# Patient Record
Sex: Male | Born: 1935 | Race: White | Hispanic: No | Marital: Married | Smoking: Former smoker
Health system: Southern US, Community
[De-identification: ages and names within clinical notes are randomized; demographics above are authoritative.]

## PROBLEM LIST (undated history)

## (undated) DIAGNOSIS — R32 Unspecified urinary incontinence: Secondary | ICD-10-CM

## (undated) DIAGNOSIS — Z8619 Personal history of other infectious and parasitic diseases: Secondary | ICD-10-CM

## (undated) DIAGNOSIS — E785 Hyperlipidemia, unspecified: Secondary | ICD-10-CM

## (undated) DIAGNOSIS — H409 Unspecified glaucoma: Secondary | ICD-10-CM

## (undated) DIAGNOSIS — N400 Enlarged prostate without lower urinary tract symptoms: Secondary | ICD-10-CM

## (undated) DIAGNOSIS — E669 Obesity, unspecified: Secondary | ICD-10-CM

## (undated) DIAGNOSIS — K219 Gastro-esophageal reflux disease without esophagitis: Secondary | ICD-10-CM

## (undated) DIAGNOSIS — L570 Actinic keratosis: Secondary | ICD-10-CM

## (undated) DIAGNOSIS — R7303 Prediabetes: Secondary | ICD-10-CM

## (undated) DIAGNOSIS — L719 Rosacea, unspecified: Secondary | ICD-10-CM

## (undated) DIAGNOSIS — K5909 Other constipation: Secondary | ICD-10-CM

## (undated) HISTORY — DX: Gastro-esophageal reflux disease without esophagitis: K21.9

## (undated) HISTORY — DX: Unspecified glaucoma: H40.9

## (undated) HISTORY — DX: Rosacea, unspecified: L71.9

## (undated) HISTORY — DX: Prediabetes: R73.03

## (undated) HISTORY — DX: Personal history of other infectious and parasitic diseases: Z86.19

## (undated) HISTORY — DX: Other constipation: K59.09

## (undated) HISTORY — DX: Actinic keratosis: L57.0

## (undated) HISTORY — DX: Hyperlipidemia, unspecified: E78.5

## (undated) HISTORY — DX: Benign prostatic hyperplasia without lower urinary tract symptoms: N40.0

## (undated) HISTORY — DX: Unspecified urinary incontinence: R32

## (undated) HISTORY — DX: Obesity, unspecified: E66.9

---

## 1974-06-05 HISTORY — PX: HEMORRHOID SURGERY: SHX153

## 2007-01-30 DIAGNOSIS — C4492 Squamous cell carcinoma of skin, unspecified: Secondary | ICD-10-CM

## 2007-01-30 HISTORY — DX: Squamous cell carcinoma of skin, unspecified: C44.92

## 2009-03-05 HISTORY — PX: ESOPHAGOGASTRODUODENOSCOPY: SHX1529

## 2009-03-05 HISTORY — PX: COLONOSCOPY: SHX174

## 2009-03-10 LAB — HM COLONOSCOPY

## 2011-06-13 DIAGNOSIS — R351 Nocturia: Secondary | ICD-10-CM | POA: Diagnosis not present

## 2011-06-13 DIAGNOSIS — R350 Frequency of micturition: Secondary | ICD-10-CM | POA: Diagnosis not present

## 2011-06-13 DIAGNOSIS — N401 Enlarged prostate with lower urinary tract symptoms: Secondary | ICD-10-CM | POA: Diagnosis not present

## 2011-06-22 DIAGNOSIS — L570 Actinic keratosis: Secondary | ICD-10-CM | POA: Diagnosis not present

## 2011-06-22 DIAGNOSIS — D045 Carcinoma in situ of skin of trunk: Secondary | ICD-10-CM | POA: Diagnosis not present

## 2011-08-01 DIAGNOSIS — R351 Nocturia: Secondary | ICD-10-CM | POA: Diagnosis not present

## 2011-08-08 DIAGNOSIS — E871 Hypo-osmolality and hyponatremia: Secondary | ICD-10-CM | POA: Diagnosis not present

## 2011-08-23 DIAGNOSIS — L719 Rosacea, unspecified: Secondary | ICD-10-CM | POA: Diagnosis not present

## 2011-08-23 DIAGNOSIS — Z85828 Personal history of other malignant neoplasm of skin: Secondary | ICD-10-CM | POA: Diagnosis not present

## 2011-08-31 DIAGNOSIS — E871 Hypo-osmolality and hyponatremia: Secondary | ICD-10-CM | POA: Diagnosis not present

## 2011-10-10 DIAGNOSIS — H409 Unspecified glaucoma: Secondary | ICD-10-CM | POA: Diagnosis not present

## 2011-10-10 DIAGNOSIS — H4010X0 Unspecified open-angle glaucoma, stage unspecified: Secondary | ICD-10-CM | POA: Diagnosis not present

## 2011-11-27 DIAGNOSIS — R351 Nocturia: Secondary | ICD-10-CM | POA: Diagnosis not present

## 2011-11-27 DIAGNOSIS — E871 Hypo-osmolality and hyponatremia: Secondary | ICD-10-CM | POA: Diagnosis not present

## 2011-11-28 DIAGNOSIS — R030 Elevated blood-pressure reading, without diagnosis of hypertension: Secondary | ICD-10-CM | POA: Diagnosis not present

## 2011-11-28 DIAGNOSIS — K219 Gastro-esophageal reflux disease without esophagitis: Secondary | ICD-10-CM | POA: Diagnosis not present

## 2011-11-28 DIAGNOSIS — Z125 Encounter for screening for malignant neoplasm of prostate: Secondary | ICD-10-CM | POA: Diagnosis not present

## 2011-11-28 DIAGNOSIS — E782 Mixed hyperlipidemia: Secondary | ICD-10-CM | POA: Diagnosis not present

## 2011-11-28 DIAGNOSIS — R350 Frequency of micturition: Secondary | ICD-10-CM | POA: Diagnosis not present

## 2011-12-28 DIAGNOSIS — I10 Essential (primary) hypertension: Secondary | ICD-10-CM | POA: Diagnosis not present

## 2012-04-16 DIAGNOSIS — H2510 Age-related nuclear cataract, unspecified eye: Secondary | ICD-10-CM | POA: Diagnosis not present

## 2012-04-16 DIAGNOSIS — H4010X0 Unspecified open-angle glaucoma, stage unspecified: Secondary | ICD-10-CM | POA: Diagnosis not present

## 2012-05-23 DIAGNOSIS — L719 Rosacea, unspecified: Secondary | ICD-10-CM | POA: Diagnosis not present

## 2012-05-23 DIAGNOSIS — Z85828 Personal history of other malignant neoplasm of skin: Secondary | ICD-10-CM | POA: Diagnosis not present

## 2012-05-23 DIAGNOSIS — D1800 Hemangioma unspecified site: Secondary | ICD-10-CM | POA: Diagnosis not present

## 2012-05-23 DIAGNOSIS — L570 Actinic keratosis: Secondary | ICD-10-CM | POA: Diagnosis not present

## 2012-09-24 DIAGNOSIS — K219 Gastro-esophageal reflux disease without esophagitis: Secondary | ICD-10-CM | POA: Diagnosis not present

## 2012-09-24 DIAGNOSIS — E782 Mixed hyperlipidemia: Secondary | ICD-10-CM | POA: Diagnosis not present

## 2012-09-24 DIAGNOSIS — N400 Enlarged prostate without lower urinary tract symptoms: Secondary | ICD-10-CM | POA: Diagnosis not present

## 2012-09-24 LAB — PSA: PSA: 0.4

## 2012-11-05 DIAGNOSIS — H4010X0 Unspecified open-angle glaucoma, stage unspecified: Secondary | ICD-10-CM | POA: Diagnosis not present

## 2012-11-05 DIAGNOSIS — H409 Unspecified glaucoma: Secondary | ICD-10-CM | POA: Diagnosis not present

## 2012-11-13 DIAGNOSIS — R351 Nocturia: Secondary | ICD-10-CM | POA: Diagnosis not present

## 2012-11-13 DIAGNOSIS — N401 Enlarged prostate with lower urinary tract symptoms: Secondary | ICD-10-CM | POA: Diagnosis not present

## 2012-12-17 DIAGNOSIS — H4011X0 Primary open-angle glaucoma, stage unspecified: Secondary | ICD-10-CM | POA: Diagnosis not present

## 2013-01-02 DIAGNOSIS — Z85828 Personal history of other malignant neoplasm of skin: Secondary | ICD-10-CM | POA: Diagnosis not present

## 2013-01-02 DIAGNOSIS — L819 Disorder of pigmentation, unspecified: Secondary | ICD-10-CM | POA: Diagnosis not present

## 2013-01-02 DIAGNOSIS — L570 Actinic keratosis: Secondary | ICD-10-CM | POA: Diagnosis not present

## 2013-04-09 DIAGNOSIS — E782 Mixed hyperlipidemia: Secondary | ICD-10-CM | POA: Diagnosis not present

## 2013-04-09 DIAGNOSIS — N400 Enlarged prostate without lower urinary tract symptoms: Secondary | ICD-10-CM | POA: Diagnosis not present

## 2013-04-09 DIAGNOSIS — K219 Gastro-esophageal reflux disease without esophagitis: Secondary | ICD-10-CM | POA: Diagnosis not present

## 2013-04-16 DIAGNOSIS — L719 Rosacea, unspecified: Secondary | ICD-10-CM | POA: Diagnosis not present

## 2013-04-16 DIAGNOSIS — D485 Neoplasm of uncertain behavior of skin: Secondary | ICD-10-CM | POA: Diagnosis not present

## 2013-04-16 DIAGNOSIS — L820 Inflamed seborrheic keratosis: Secondary | ICD-10-CM | POA: Diagnosis not present

## 2013-04-16 DIAGNOSIS — L570 Actinic keratosis: Secondary | ICD-10-CM | POA: Diagnosis not present

## 2013-05-19 DIAGNOSIS — K5900 Constipation, unspecified: Secondary | ICD-10-CM | POA: Diagnosis not present

## 2013-05-19 DIAGNOSIS — K439 Ventral hernia without obstruction or gangrene: Secondary | ICD-10-CM | POA: Diagnosis not present

## 2013-05-20 DIAGNOSIS — H4010X0 Unspecified open-angle glaucoma, stage unspecified: Secondary | ICD-10-CM | POA: Diagnosis not present

## 2013-05-20 DIAGNOSIS — H2510 Age-related nuclear cataract, unspecified eye: Secondary | ICD-10-CM | POA: Diagnosis not present

## 2013-05-20 DIAGNOSIS — H409 Unspecified glaucoma: Secondary | ICD-10-CM | POA: Diagnosis not present

## 2013-05-20 DIAGNOSIS — H18419 Arcus senilis, unspecified eye: Secondary | ICD-10-CM | POA: Diagnosis not present

## 2013-06-25 DIAGNOSIS — M6200 Separation of muscle (nontraumatic), unspecified site: Secondary | ICD-10-CM | POA: Diagnosis not present

## 2013-06-25 DIAGNOSIS — R198 Other specified symptoms and signs involving the digestive system and abdomen: Secondary | ICD-10-CM | POA: Diagnosis not present

## 2013-06-25 DIAGNOSIS — K5900 Constipation, unspecified: Secondary | ICD-10-CM | POA: Diagnosis not present

## 2013-07-21 ENCOUNTER — Emergency Department: Payer: Self-pay | Admitting: Emergency Medicine

## 2013-07-21 DIAGNOSIS — E785 Hyperlipidemia, unspecified: Secondary | ICD-10-CM | POA: Diagnosis not present

## 2013-07-21 DIAGNOSIS — S0180XA Unspecified open wound of other part of head, initial encounter: Secondary | ICD-10-CM | POA: Diagnosis not present

## 2013-07-26 ENCOUNTER — Emergency Department: Payer: Self-pay | Admitting: Emergency Medicine

## 2013-10-30 DIAGNOSIS — L719 Rosacea, unspecified: Secondary | ICD-10-CM | POA: Diagnosis not present

## 2013-10-30 DIAGNOSIS — B079 Viral wart, unspecified: Secondary | ICD-10-CM | POA: Diagnosis not present

## 2013-10-30 DIAGNOSIS — N401 Enlarged prostate with lower urinary tract symptoms: Secondary | ICD-10-CM | POA: Diagnosis not present

## 2013-10-30 DIAGNOSIS — N138 Other obstructive and reflux uropathy: Secondary | ICD-10-CM | POA: Diagnosis not present

## 2013-10-30 DIAGNOSIS — L570 Actinic keratosis: Secondary | ICD-10-CM | POA: Diagnosis not present

## 2013-11-04 DIAGNOSIS — N138 Other obstructive and reflux uropathy: Secondary | ICD-10-CM | POA: Diagnosis not present

## 2013-11-04 DIAGNOSIS — N401 Enlarged prostate with lower urinary tract symptoms: Secondary | ICD-10-CM | POA: Diagnosis not present

## 2013-11-04 DIAGNOSIS — E782 Mixed hyperlipidemia: Secondary | ICD-10-CM | POA: Diagnosis not present

## 2013-11-04 DIAGNOSIS — R39198 Other difficulties with micturition: Secondary | ICD-10-CM | POA: Diagnosis not present

## 2013-11-04 DIAGNOSIS — R7309 Other abnormal glucose: Secondary | ICD-10-CM | POA: Diagnosis not present

## 2013-11-25 DIAGNOSIS — H4010X0 Unspecified open-angle glaucoma, stage unspecified: Secondary | ICD-10-CM | POA: Diagnosis not present

## 2013-11-25 DIAGNOSIS — H409 Unspecified glaucoma: Secondary | ICD-10-CM | POA: Diagnosis not present

## 2013-12-31 DIAGNOSIS — B079 Viral wart, unspecified: Secondary | ICD-10-CM | POA: Diagnosis not present

## 2013-12-31 DIAGNOSIS — L570 Actinic keratosis: Secondary | ICD-10-CM | POA: Diagnosis not present

## 2013-12-31 DIAGNOSIS — L820 Inflamed seborrheic keratosis: Secondary | ICD-10-CM | POA: Diagnosis not present

## 2014-03-23 DIAGNOSIS — Z1389 Encounter for screening for other disorder: Secondary | ICD-10-CM | POA: Diagnosis not present

## 2014-03-23 DIAGNOSIS — E669 Obesity, unspecified: Secondary | ICD-10-CM | POA: Diagnosis not present

## 2014-03-23 DIAGNOSIS — Z23 Encounter for immunization: Secondary | ICD-10-CM | POA: Diagnosis not present

## 2014-03-23 DIAGNOSIS — R296 Repeated falls: Secondary | ICD-10-CM | POA: Diagnosis not present

## 2014-03-23 DIAGNOSIS — Z833 Family history of diabetes mellitus: Secondary | ICD-10-CM | POA: Diagnosis not present

## 2014-03-23 DIAGNOSIS — Z0000 Encounter for general adult medical examination without abnormal findings: Secondary | ICD-10-CM | POA: Diagnosis not present

## 2014-03-25 DIAGNOSIS — R351 Nocturia: Secondary | ICD-10-CM | POA: Diagnosis not present

## 2014-03-25 DIAGNOSIS — N401 Enlarged prostate with lower urinary tract symptoms: Secondary | ICD-10-CM | POA: Diagnosis not present

## 2014-03-25 DIAGNOSIS — N500 Atrophy of testis: Secondary | ICD-10-CM | POA: Diagnosis not present

## 2014-04-02 DIAGNOSIS — R12 Heartburn: Secondary | ICD-10-CM | POA: Diagnosis not present

## 2014-05-06 DIAGNOSIS — E781 Pure hyperglyceridemia: Secondary | ICD-10-CM | POA: Diagnosis not present

## 2014-05-06 DIAGNOSIS — E784 Other hyperlipidemia: Secondary | ICD-10-CM | POA: Diagnosis not present

## 2014-06-19 DIAGNOSIS — H4011X0 Primary open-angle glaucoma, stage unspecified: Secondary | ICD-10-CM | POA: Diagnosis not present

## 2014-06-19 DIAGNOSIS — N500 Atrophy of testis: Secondary | ICD-10-CM | POA: Diagnosis not present

## 2014-06-19 DIAGNOSIS — N401 Enlarged prostate with lower urinary tract symptoms: Secondary | ICD-10-CM | POA: Diagnosis not present

## 2014-06-19 DIAGNOSIS — H01009 Unspecified blepharitis unspecified eye, unspecified eyelid: Secondary | ICD-10-CM | POA: Diagnosis not present

## 2014-06-19 DIAGNOSIS — H269 Unspecified cataract: Secondary | ICD-10-CM | POA: Diagnosis not present

## 2014-07-01 DIAGNOSIS — N500 Atrophy of testis: Secondary | ICD-10-CM | POA: Diagnosis not present

## 2014-07-01 DIAGNOSIS — R351 Nocturia: Secondary | ICD-10-CM | POA: Diagnosis not present

## 2014-07-01 DIAGNOSIS — N401 Enlarged prostate with lower urinary tract symptoms: Secondary | ICD-10-CM | POA: Diagnosis not present

## 2014-09-03 DIAGNOSIS — D1800 Hemangioma unspecified site: Secondary | ICD-10-CM | POA: Diagnosis not present

## 2014-09-03 DIAGNOSIS — D229 Melanocytic nevi, unspecified: Secondary | ICD-10-CM | POA: Diagnosis not present

## 2014-09-03 DIAGNOSIS — L821 Other seborrheic keratosis: Secondary | ICD-10-CM | POA: Diagnosis not present

## 2014-09-03 DIAGNOSIS — D485 Neoplasm of uncertain behavior of skin: Secondary | ICD-10-CM | POA: Diagnosis not present

## 2014-09-03 DIAGNOSIS — Z85828 Personal history of other malignant neoplasm of skin: Secondary | ICD-10-CM | POA: Diagnosis not present

## 2014-09-03 DIAGNOSIS — Z1283 Encounter for screening for malignant neoplasm of skin: Secondary | ICD-10-CM | POA: Diagnosis not present

## 2014-09-03 DIAGNOSIS — C4491 Basal cell carcinoma of skin, unspecified: Secondary | ICD-10-CM

## 2014-09-03 DIAGNOSIS — L578 Other skin changes due to chronic exposure to nonionizing radiation: Secondary | ICD-10-CM | POA: Diagnosis not present

## 2014-09-03 DIAGNOSIS — L570 Actinic keratosis: Secondary | ICD-10-CM | POA: Diagnosis not present

## 2014-09-03 DIAGNOSIS — C44319 Basal cell carcinoma of skin of other parts of face: Secondary | ICD-10-CM | POA: Diagnosis not present

## 2014-09-03 HISTORY — DX: Basal cell carcinoma of skin, unspecified: C44.91

## 2014-09-08 DIAGNOSIS — K219 Gastro-esophageal reflux disease without esophagitis: Secondary | ICD-10-CM | POA: Insufficient documentation

## 2014-09-08 DIAGNOSIS — I10 Essential (primary) hypertension: Secondary | ICD-10-CM | POA: Insufficient documentation

## 2014-09-08 DIAGNOSIS — E66811 Obesity, class 1: Secondary | ICD-10-CM

## 2014-09-08 DIAGNOSIS — Z0000 Encounter for general adult medical examination without abnormal findings: Secondary | ICD-10-CM | POA: Insufficient documentation

## 2014-09-08 DIAGNOSIS — Z1331 Encounter for screening for depression: Secondary | ICD-10-CM | POA: Insufficient documentation

## 2014-09-08 DIAGNOSIS — H409 Unspecified glaucoma: Secondary | ICD-10-CM | POA: Insufficient documentation

## 2014-09-08 DIAGNOSIS — N400 Enlarged prostate without lower urinary tract symptoms: Secondary | ICD-10-CM | POA: Insufficient documentation

## 2014-09-08 DIAGNOSIS — K439 Ventral hernia without obstruction or gangrene: Secondary | ICD-10-CM | POA: Insufficient documentation

## 2014-09-08 DIAGNOSIS — Z23 Encounter for immunization: Secondary | ICD-10-CM | POA: Insufficient documentation

## 2014-09-08 DIAGNOSIS — E785 Hyperlipidemia, unspecified: Secondary | ICD-10-CM | POA: Insufficient documentation

## 2014-09-08 DIAGNOSIS — E663 Overweight: Secondary | ICD-10-CM | POA: Insufficient documentation

## 2014-09-08 DIAGNOSIS — E669 Obesity, unspecified: Secondary | ICD-10-CM

## 2014-09-08 DIAGNOSIS — K5909 Other constipation: Secondary | ICD-10-CM | POA: Insufficient documentation

## 2014-09-08 DIAGNOSIS — Z125 Encounter for screening for malignant neoplasm of prostate: Secondary | ICD-10-CM | POA: Insufficient documentation

## 2014-09-08 HISTORY — DX: Benign prostatic hyperplasia without lower urinary tract symptoms: N40.0

## 2014-09-08 HISTORY — DX: Obesity, class 1: E66.811

## 2014-09-08 HISTORY — DX: Obesity, unspecified: E66.9

## 2014-09-08 HISTORY — DX: Other constipation: K59.09

## 2014-11-06 ENCOUNTER — Encounter: Payer: Self-pay | Admitting: Family Medicine

## 2014-11-06 ENCOUNTER — Ambulatory Visit (INDEPENDENT_AMBULATORY_CARE_PROVIDER_SITE_OTHER): Payer: Medicare Other | Admitting: Family Medicine

## 2014-11-06 VITALS — BP 140/70 | HR 63 | Resp 15 | Ht 70.5 in | Wt 238.3 lb

## 2014-11-06 DIAGNOSIS — E782 Mixed hyperlipidemia: Secondary | ICD-10-CM | POA: Diagnosis not present

## 2014-11-06 DIAGNOSIS — K219 Gastro-esophageal reflux disease without esophagitis: Secondary | ICD-10-CM | POA: Diagnosis not present

## 2014-11-06 MED ORDER — SIMVASTATIN 40 MG PO TABS: 40.0000 mg | ORAL_TABLET | Freq: Every day | ORAL | Status: DC

## 2014-11-06 MED ORDER — FENOFIBRATE 160 MG PO TABS: 160.0000 mg | ORAL_TABLET | Freq: Every day | ORAL | Status: DC

## 2014-11-06 NOTE — Progress Notes (Signed)
   Subjective:    Patient ID: Joshua Everett, male    DOB: 08-29-1934, 80 y.o.   MRN: [id number]  Hyperlipidemia This is a chronic problem. The problem is controlled. Recent lipid tests were reviewed and are normal. Exacerbating diseases include obesity. He has no history of diabetes, hypothyroidism or liver disease. Pertinent negatives include no chest pain, leg pain, myalgias or shortness of breath. Current antihyperlipidemic treatment includes statins and fibric acid derivatives. The current treatment provides moderate improvement of lipids. There are no compliance problems.  Risk factors for coronary artery disease include dyslipidemia, male sex and obesity.   Past Medical History  Diagnosis Date  . Hyperlipidemia   . GERD (gastroesophageal reflux disease)   . Glaucoma     Past Surgical History  Procedure Laterality Date  . Hemorrhoid surgery     History   Social History  . Marital Status: Married    Spouse Name: N/A  . Number of Children: N/A  . Years of Education: N/A   Occupational History  . Not on file.   Social History Main Topics  . Smoking status: Former Research scientist (life sciences)  . Smokeless tobacco: Never Used  . Alcohol Use: No  . Drug Use: No  . Sexual Activity: Not on file   Other Topics Concern  . Not on file   Social History Narrative   Family History  Problem Relation Age of Onset  . Diabetes Mother   . Kidney disease Father         Review of Systems  Respiratory: Negative for shortness of breath.   Cardiovascular: Negative for chest pain.  Musculoskeletal: Negative for myalgias and arthralgias.   Filed Vitals:   11/06/14 0906  BP: 140/70  Pulse: 63  Resp: 15       Objective:   Physical Exam  Constitutional: He appears well-developed and well-nourished.  Cardiovascular: Normal rate.  Exam reveals no gallop and no friction rub.   No murmur heard. Carotid artery auscultation reveals no bruits bilaterally.  Pulmonary/Chest: Effort normal. He has no  wheezes.  Abdominal: Soft. Bowel sounds are normal.          Assessment & Plan:  1. Hypercholesterolemia with hypertriglyceridemia  - fenofibrate 160 MG tablet; Take 1 tablet (160 mg total) by mouth daily. Take 160 mg by mouth daily.  Dispense: 90 tablet; Refill: 1 - simvastatin (ZOCOR) 40 MG tablet; Take 1 tablet (40 mg total) by mouth daily at 6 PM.  Dispense: 90 tablet; Refill: 1 - Lipid Profile - Comp Met (CMET)

## 2014-11-07 LAB — COMPREHENSIVE METABOLIC PANEL
ALT: 14 IU/L (ref 0–44)
AST: 18 IU/L (ref 0–40)
Albumin/Globulin Ratio: 1.9 (ref 1.1–2.5)
Albumin: 4.1 g/dL (ref 3.5–4.8)
Alkaline Phosphatase: 42 IU/L (ref 39–117)
BUN / CREAT RATIO: 14 (ref 10–22)
BUN: 13 mg/dL (ref 8–27)
Bilirubin Total: 0.6 mg/dL (ref 0.0–1.2)
CHLORIDE: 100 mmol/L (ref 97–108)
CO2: 26 mmol/L (ref 18–29)
Calcium: 9.6 mg/dL (ref 8.6–10.2)
Creatinine, Ser: 0.96 mg/dL (ref 0.76–1.27)
GFR calc non Af Amer: 75 mL/min/{1.73_m2} (ref >59)
GFR, EST AFRICAN AMERICAN: 87 mL/min/{1.73_m2} (ref >59)
GLOBULIN, TOTAL: 2.2 g/dL (ref 1.5–4.5)
GLUCOSE: 98 mg/dL (ref 65–99)
POTASSIUM: 4.8 mmol/L (ref 3.5–5.2)
Sodium: 138 mmol/L (ref 134–144)
Total Protein: 6.3 g/dL (ref 6.0–8.5)

## 2014-11-07 LAB — LIPID PANEL
Chol/HDL Ratio: 3.8 ratio units (ref 0.0–5.0)
Cholesterol, Total: 147 mg/dL (ref 100–199)
HDL: 39 mg/dL — ABNORMAL LOW (ref >39)
LDL CALC: 83 mg/dL (ref 0–99)
Triglycerides: 127 mg/dL (ref 0–149)
VLDL Cholesterol Cal: 25 mg/dL (ref 5–40)

## 2014-11-19 DIAGNOSIS — C44319 Basal cell carcinoma of skin of other parts of face: Secondary | ICD-10-CM | POA: Diagnosis not present

## 2014-11-19 DIAGNOSIS — Z85828 Personal history of other malignant neoplasm of skin: Secondary | ICD-10-CM | POA: Insufficient documentation

## 2014-12-21 DIAGNOSIS — H18411 Arcus senilis, right eye: Secondary | ICD-10-CM | POA: Diagnosis not present

## 2014-12-21 DIAGNOSIS — H269 Unspecified cataract: Secondary | ICD-10-CM | POA: Diagnosis not present

## 2014-12-21 DIAGNOSIS — H01009 Unspecified blepharitis unspecified eye, unspecified eyelid: Secondary | ICD-10-CM | POA: Diagnosis not present

## 2014-12-21 DIAGNOSIS — H409 Unspecified glaucoma: Secondary | ICD-10-CM | POA: Diagnosis not present

## 2015-01-28 DIAGNOSIS — D485 Neoplasm of uncertain behavior of skin: Secondary | ICD-10-CM | POA: Diagnosis not present

## 2015-01-28 DIAGNOSIS — L739 Follicular disorder, unspecified: Secondary | ICD-10-CM | POA: Diagnosis not present

## 2015-01-28 DIAGNOSIS — D229 Melanocytic nevi, unspecified: Secondary | ICD-10-CM | POA: Diagnosis not present

## 2015-01-28 DIAGNOSIS — C4491 Basal cell carcinoma of skin, unspecified: Secondary | ICD-10-CM

## 2015-01-28 DIAGNOSIS — C44311 Basal cell carcinoma of skin of nose: Secondary | ICD-10-CM | POA: Diagnosis not present

## 2015-01-28 DIAGNOSIS — I788 Other diseases of capillaries: Secondary | ICD-10-CM | POA: Diagnosis not present

## 2015-01-28 DIAGNOSIS — D1800 Hemangioma unspecified site: Secondary | ICD-10-CM | POA: Diagnosis not present

## 2015-01-28 DIAGNOSIS — L570 Actinic keratosis: Secondary | ICD-10-CM | POA: Diagnosis not present

## 2015-01-28 DIAGNOSIS — L578 Other skin changes due to chronic exposure to nonionizing radiation: Secondary | ICD-10-CM | POA: Diagnosis not present

## 2015-01-28 HISTORY — DX: Basal cell carcinoma of skin, unspecified: C44.91

## 2015-02-16 DIAGNOSIS — H25813 Combined forms of age-related cataract, bilateral: Secondary | ICD-10-CM | POA: Diagnosis not present

## 2015-02-16 DIAGNOSIS — H5213 Myopia, bilateral: Secondary | ICD-10-CM | POA: Diagnosis not present

## 2015-02-16 DIAGNOSIS — H4010X1 Unspecified open-angle glaucoma, mild stage: Secondary | ICD-10-CM | POA: Diagnosis not present

## 2015-02-16 DIAGNOSIS — H524 Presbyopia: Secondary | ICD-10-CM | POA: Diagnosis not present

## 2015-02-16 DIAGNOSIS — H04123 Dry eye syndrome of bilateral lacrimal glands: Secondary | ICD-10-CM | POA: Diagnosis not present

## 2015-02-16 DIAGNOSIS — H52223 Regular astigmatism, bilateral: Secondary | ICD-10-CM | POA: Diagnosis not present

## 2015-04-01 DIAGNOSIS — L718 Other rosacea: Secondary | ICD-10-CM | POA: Diagnosis not present

## 2015-04-01 DIAGNOSIS — Z85828 Personal history of other malignant neoplasm of skin: Secondary | ICD-10-CM | POA: Diagnosis not present

## 2015-04-14 DIAGNOSIS — C44319 Basal cell carcinoma of skin of other parts of face: Secondary | ICD-10-CM | POA: Diagnosis not present

## 2015-05-04 ENCOUNTER — Other Ambulatory Visit: Payer: Self-pay | Admitting: Family Medicine

## 2015-05-10 ENCOUNTER — Ambulatory Visit (INDEPENDENT_AMBULATORY_CARE_PROVIDER_SITE_OTHER): Payer: Medicare Other | Admitting: Family Medicine

## 2015-05-10 ENCOUNTER — Encounter: Payer: Self-pay | Admitting: Family Medicine

## 2015-05-10 VITALS — BP 138/68 | HR 59 | Temp 98.1°F | Resp 16 | Ht 71.0 in | Wt 238.5 lb

## 2015-05-10 DIAGNOSIS — E782 Mixed hyperlipidemia: Secondary | ICD-10-CM

## 2015-05-10 NOTE — Progress Notes (Signed)
Name: Joshua Everett   MRN: [id number]    DOB: 09/03/1934   Date:05/10/2015       Progress Note  Subjective  Chief Complaint  Chief Complaint  Patient presents with  . Medication Refill    6 month follow-up  . Hyperlipidemia  . Gastroesophageal Reflux  . Constipation    Hyperlipidemia This is a chronic problem. The problem is controlled. Recent lipid tests were reviewed and are normal. Pertinent negatives include no chest pain, leg pain, myalgias or shortness of breath. Current antihyperlipidemic treatment includes statins and fibric acid derivatives. The current treatment provides significant improvement of lipids. There are no compliance problems.     Past Medical History  Diagnosis Date  . Hyperlipidemia   . GERD (gastroesophageal reflux disease)   . Glaucoma     Past Surgical History  Procedure Laterality Date  . Hemorrhoid surgery      Family History  Problem Relation Age of Onset  . Diabetes Mother   . Kidney disease Father     Social History   Social History  . Marital Status: Married    Spouse Name: N/A  . Number of Children: N/A  . Years of Education: N/A   Occupational History  . Not on file.   Social History Main Topics  . Smoking status: Former Research scientist (life sciences)  . Smokeless tobacco: Never Used  . Alcohol Use: No  . Drug Use: No  . Sexual Activity: Not on file   Other Topics Concern  . Not on file   Social History Narrative     Current outpatient prescriptions:  .  augmented betamethasone dipropionate (DIPROLENE-AF) 0.05 % cream, as needed., Disp: , Rfl:  .  B COMPLEX-BIOTIN-FA PO, Take by mouth daily., Disp: , Rfl:  .  bimatoprost (LUMIGAN) 0.01 % SOLN, Apply to eye., Disp: , Rfl:  .  desmopressin (DDAVP) 0.1 MG tablet, Take 0.1 mg by mouth daily. Urology, Disp: , Rfl:  .  fenofibrate 160 MG tablet, Take 1 tablet (160 mg total) by mouth daily. Take 160 mg by mouth daily., Disp: 90 tablet, Rfl: 1 .  Linaclotide (LINZESS) 290 MCG CAPS capsule, Take 290  mcg by mouth daily., Disp: , Rfl:  .  MULTIPLE VITAMIN PO, Take by mouth daily., Disp: , Rfl:  .  OMEGA-3 FATTY ACIDS PO, Take 1,000 mcg by mouth daily., Disp: , Rfl:  .  pantoprazole (PROTONIX) 40 MG tablet, Take 40 mg by mouth daily., Disp: , Rfl:  .  simvastatin (ZOCOR) 40 MG tablet, TAKE 1 TABLET DAILY AT 6 P.M., Disp: 90 tablet, Rfl: 0 .  tamsulosin (FLOMAX) 0.4 MG CAPS capsule, Take 0.4 mg by mouth daily., Disp: , Rfl:   No Known Allergies   Review of Systems  Constitutional: Negative for fever and chills.  Respiratory: Negative for shortness of breath.   Cardiovascular: Negative for chest pain and palpitations.  Musculoskeletal: Negative for myalgias.   Objective  Filed Vitals:   05/10/15 1041  BP: 138/68  Pulse: 59  Temp: 98.1 F (36.7 C)  TempSrc: Oral  Resp: 16  Height: 5\' 11"  (1.803 m)  Weight: 238 lb 8.3 oz (108.192 kg)  SpO2: 97%    Physical Exam  Constitutional: He is oriented to person, place, and time and well-developed, well-nourished, and in no distress.  HENT:  Head: Normocephalic and atraumatic.  Cardiovascular: Normal rate, regular rhythm and normal heart sounds.   No murmur heard. Pulmonary/Chest: Effort normal and breath sounds normal. He has no wheezes.  Abdominal:  Soft. Bowel sounds are normal. There is no tenderness.  Neurological: He is alert and oriented to person, place, and time.  Nursing note and vitals reviewed.   Assessment & Plan  1. Hypercholesterolemia with hypertriglyceridemia Stable recheck FLP and liver enzymes today. - Lipid Profile - Comprehensive Metabolic Panel (CMET)   Esther Bradstreet Asad A. [facility] 05/10/2015 10:59 AM

## 2015-05-11 ENCOUNTER — Ambulatory Visit: Payer: Medicare Other | Admitting: Family Medicine

## 2015-05-11 LAB — COMPREHENSIVE METABOLIC PANEL
A/G RATIO: 1.6 (ref 1.1–2.5)
ALT: 15 IU/L (ref 0–44)
AST: 20 IU/L (ref 0–40)
Albumin: 4 g/dL (ref 3.5–4.8)
Alkaline Phosphatase: 42 IU/L (ref 39–117)
BUN/Creatinine Ratio: 15 (ref 10–22)
BUN: 16 mg/dL (ref 8–27)
Bilirubin Total: 0.5 mg/dL (ref 0.0–1.2)
CALCIUM: 9.9 mg/dL (ref 8.6–10.2)
CO2: 28 mmol/L (ref 18–29)
Chloride: 102 mmol/L (ref 97–106)
Creatinine, Ser: 1.07 mg/dL (ref 0.76–1.27)
GFR calc Af Amer: 76 mL/min/{1.73_m2} (ref >59)
GFR, EST NON AFRICAN AMERICAN: 66 mL/min/{1.73_m2} (ref >59)
GLUCOSE: 101 mg/dL — AB (ref 65–99)
Globulin, Total: 2.5 g/dL (ref 1.5–4.5)
Potassium: 5 mmol/L (ref 3.5–5.2)
Sodium: 141 mmol/L (ref 136–144)
Total Protein: 6.5 g/dL (ref 6.0–8.5)

## 2015-05-11 LAB — LIPID PANEL
CHOL/HDL RATIO: 3.4 ratio (ref 0.0–5.0)
Cholesterol, Total: 131 mg/dL (ref 100–199)
HDL: 39 mg/dL — ABNORMAL LOW (ref >39)
LDL CALC: 68 mg/dL (ref 0–99)
Triglycerides: 118 mg/dL (ref 0–149)
VLDL Cholesterol Cal: 24 mg/dL (ref 5–40)

## 2015-06-24 ENCOUNTER — Other Ambulatory Visit: Payer: Self-pay | Admitting: Family Medicine

## 2015-06-28 ENCOUNTER — Telehealth: Payer: Self-pay | Admitting: Gastroenterology

## 2015-06-28 NOTE — Telephone Encounter (Signed)
Spoke with pt regarding his constipation. Pt stated he finally had a bowel movement today. He doesn't have any abdominal pain, nausea or vomiting. Advised him to restart his Linzess 290mg  and add miralax daily. If no improvement with these two medications then he can add milk of magnesia. Told pt he needs to drinks lots of fluid. Pt will try this for several days and call back if no improvement.

## 2015-06-28 NOTE — Telephone Encounter (Signed)
Patient called and said he has not been able to have a BM since Monday, per patient swollen stomach. He normally takes lizness but he did stop it when he realized he couldnt go to the bathroom. Records in old system, Looks like patient hasn't been seen since 03/2014

## 2015-06-30 DIAGNOSIS — R351 Nocturia: Secondary | ICD-10-CM | POA: Diagnosis not present

## 2015-06-30 DIAGNOSIS — N500 Atrophy of testis: Secondary | ICD-10-CM | POA: Diagnosis not present

## 2015-06-30 DIAGNOSIS — N401 Enlarged prostate with lower urinary tract symptoms: Secondary | ICD-10-CM | POA: Diagnosis not present

## 2015-07-06 ENCOUNTER — Ambulatory Visit (INDEPENDENT_AMBULATORY_CARE_PROVIDER_SITE_OTHER): Payer: Medicare Other | Admitting: Family Medicine

## 2015-07-06 ENCOUNTER — Encounter: Payer: Self-pay | Admitting: Family Medicine

## 2015-07-06 VITALS — BP 136/74 | HR 68 | Temp 97.9°F | Ht 70.5 in | Wt 236.8 lb

## 2015-07-06 DIAGNOSIS — N400 Enlarged prostate without lower urinary tract symptoms: Secondary | ICD-10-CM | POA: Diagnosis not present

## 2015-07-06 DIAGNOSIS — L719 Rosacea, unspecified: Secondary | ICD-10-CM

## 2015-07-06 DIAGNOSIS — K219 Gastro-esophageal reflux disease without esophagitis: Secondary | ICD-10-CM

## 2015-07-06 DIAGNOSIS — H409 Unspecified glaucoma: Secondary | ICD-10-CM

## 2015-07-06 DIAGNOSIS — E669 Obesity, unspecified: Secondary | ICD-10-CM

## 2015-07-06 DIAGNOSIS — E785 Hyperlipidemia, unspecified: Secondary | ICD-10-CM

## 2015-07-06 DIAGNOSIS — K5900 Constipation, unspecified: Secondary | ICD-10-CM

## 2015-07-06 DIAGNOSIS — K5909 Other constipation: Secondary | ICD-10-CM

## 2015-07-06 HISTORY — DX: Rosacea, unspecified: L71.9

## 2015-07-06 NOTE — Assessment & Plan Note (Signed)
Body mass index is 33.48 kg/(m^2).

## 2015-07-06 NOTE — Patient Instructions (Addendum)
Good to meet you today.  Sign release for records from Dr Acquanetta Sit [facility] in [facility] for latest colonoscopies Return as needed or in 4 months for medicare wellness visit prior fasting for labs.

## 2015-07-06 NOTE — Assessment & Plan Note (Addendum)
Per prior records

## 2015-07-06 NOTE — Progress Notes (Signed)
BP 136/74 mmHg  Pulse 68  Temp(Src) 97.9 F (36.6 C) (Oral)  Ht 5' 10.5" (1.791 m)  Wt 236 lb 12 oz (107.389 kg)  BMI 33.48 kg/m2   CC: new pt to establish care  Subjective:    Patient ID: Joshua Everett, male    DOB: Oct 23, 1934, 81 y.o.   MRN: [id number]  HPI: Joshua Everett is a 81 y.o. male presenting on 07/06/2015 for Establish Care   Prior saw Dr Lavonia Drafts then Dr Manuella Ghazi in [place]. Presents to establish care today.  Glaucoma - to see [facility]. Prior saw Dr Tommy Rainwater in [place]. On eye drops.   Desmopressin for bedwetting for the past year - takes 3 nightly. Followed by Dr Diona Fanti. Nocturia x3. Pending last OV. On flomax. Awaiting records.   HLD - on fenofibrate, simvastatin, fish oil daily without myalgias.  Chronic constipation - on linzess daily. Also takes miralax prn.  GERD - on protonix, has seene Dr Allen Norris.   Possible rosacea on edge of nose - treated with metrogel.   Preventative: COLONOSCOPY Date: 2010 stable, rpt 5 yrs (h/o polyps) Dr Acquanetta [facility] in [facility], complicated by marked bradycardia s/p ER evaluation.  Declines flu shot. prevnar 03/2014 Tdap 2015 zostavax 04/2013  Lives with wife Occ: Nature conservation officer, retired Edu: M[employer] [place] Activity: golfs regularly Diet: good water, fruits/vegetables daily  Relevant past medical, surgical, family and social history reviewed and updated as indicated. Interim medical history since our last visit reviewed. Allergies and medications reviewed and updated. Current Outpatient Prescriptions on File Prior to Visit  Medication Sig  . B COMPLEX-BIOTIN-FA PO Take by mouth daily.  . bimatoprost (LUMIGAN) 0.01 % SOLN Apply to eye.  . fenofibrate 160 MG tablet TAKE 1 TABLET DAILY  . Linaclotide (LINZESS) 290 MCG CAPS capsule Take 290 mcg by mouth daily.  . MULTIPLE VITAMIN PO Take by mouth daily.  . OMEGA-3 FATTY ACIDS PO Take 1,000 mcg by mouth daily.  . pantoprazole (PROTONIX) 40 MG tablet Take 40 mg by  mouth daily.  . simvastatin (ZOCOR) 40 MG tablet TAKE 1 TABLET DAILY AT 6 P.M.  . tamsulosin (FLOMAX) 0.4 MG CAPS capsule Take 0.4 mg by mouth daily.   No current facility-administered medications on file prior to visit.    Review of Systems Per HPI unless specifically indicated in ROS section     Objective:    BP 136/74 mmHg  Pulse 68  Temp(Src) 97.9 F (36.6 C) (Oral)  Ht 5' 10.5" (1.791 m)  Wt 236 lb 12 oz (107.389 kg)  BMI 33.48 kg/m2  Wt Readings from Last 3 Encounters:  07/06/15 236 lb 12 oz (107.389 kg)  05/10/15 238 lb 8.3 oz (108.192 kg)  11/06/14 238 lb 4.8 oz (108.092 kg)    Physical Exam  Constitutional: He appears well-developed and well-nourished. No distress.  HENT:  Head: Normocephalic and atraumatic.  Mouth/Throat: Oropharynx is clear and moist. No oropharyngeal exudate.  Eyes: Conjunctivae and EOM are normal. Pupils are equal, round, and reactive to light. No scleral icterus.  Neck: Normal range of motion. Neck supple. No thyromegaly present.  Cardiovascular: Normal rate, regular rhythm, normal heart sounds and intact distal pulses.   No murmur heard. Pulmonary/Chest: Effort normal and breath sounds normal. No respiratory distress. He has no wheezes. He has no rales.  Musculoskeletal: He exhibits no edema.  Lymphadenopathy:    He has no cervical adenopathy.  Skin: Skin is warm and dry. No rash noted.  Psychiatric: He  has a normal mood and affect.  Nursing note and vitals reviewed.  Results for orders placed or performed in visit on 05/10/15  Lipid Profile  Result Value Ref Range   Cholesterol, Total 131 100 - 199 mg/dL   Triglycerides 118 0 - 149 mg/dL   HDL 39 (L) >39 mg/dL   VLDL Cholesterol Cal 24 5 - 40 mg/dL   LDL Calculated 68 0 - 99 mg/dL   Chol/HDL Ratio 3.4 0.0 - 5.0 ratio units  Comprehensive Metabolic Panel (CMET)  Result Value Ref Range   Glucose 101 (H) 65 - 99 mg/dL   BUN 16 8 - 27 mg/dL   Creatinine, Ser 1.07 0.76 - 1.27 mg/dL    GFR calc non Af Amer 66 >59 mL/min/1.73   GFR calc Af Amer 76 >59 mL/min/1.73   BUN/Creatinine Ratio 15 10 - 22   Sodium 141 136 - 144 mmol/L   Potassium 5.0 3.5 - 5.2 mmol/L   Chloride 102 97 - 106 mmol/L   CO2 28 18 - 29 mmol/L   Calcium 9.9 8.6 - 10.2 mg/dL   Total Protein 6.5 6.0 - 8.5 g/dL   Albumin 4.0 3.5 - 4.8 g/dL   Globulin, Total 2.5 1.5 - 4.5 g/dL   Albumin/Globulin Ratio 1.6 1.1 - 2.5   Bilirubin Total 0.5 0.0 - 1.2 mg/dL   Alkaline Phosphatase 42 39 - 117 IU/L   AST 20 0 - 40 IU/L   ALT 15 0 - 44 IU/L      Assessment & Plan:   Problem List Items Addressed This Visit    Rosacea    Per prior records      Obesity, Class I, BMI 30-34.9    Body mass index is 33.48 kg/(m^2).       Glaucoma    Continue lumigan drops. Sees ophtho.      Relevant Medications   cycloSPORINE (RESTASIS) 0.05 % ophthalmic emulsion   GERD (gastroesophageal reflux disease)    Controlled with protonix PPI daily. Continue.      Relevant Medications   polyethylene glycol (MIRALAX / GLYCOLAX) packet   Dyslipidemia - Primary    Chronic. Continue zocor, fibrate, fish oil.      Chronic constipation    Continue linzess daily and miralax prn.      Relevant Medications   polyethylene glycol (MIRALAX / GLYCOLAX) packet   BPH without obstruction/lower urinary tract symptoms    Await urology records ([place]). Continue flomax and desmopressin.            Follow up plan: Return in about 4 months (around 11/03/2015), or as needed, for medicare wellness visit.

## 2015-07-06 NOTE — Assessment & Plan Note (Signed)
Chronic. Continue zocor, fibrate, fish oil.

## 2015-07-06 NOTE — Assessment & Plan Note (Signed)
Continue linzess daily and miralax prn.

## 2015-07-06 NOTE — Assessment & Plan Note (Signed)
Controlled with protonix PPI daily. Continue.

## 2015-07-06 NOTE — Assessment & Plan Note (Signed)
Continue lumigan drops. Sees ophtho.

## 2015-07-06 NOTE — Assessment & Plan Note (Addendum)
Await urology records ([place]). Continue flomax and desmopressin.

## 2015-07-06 NOTE — Progress Notes (Signed)
Pre visit review using our clinic review tool, if applicable. No additional management support is needed unless otherwise documented below in the visit note. 

## 2015-07-28 ENCOUNTER — Other Ambulatory Visit: Payer: Self-pay | Admitting: *Deleted

## 2015-07-28 MED ORDER — SIMVASTATIN 40 MG PO TABS: ORAL_TABLET | ORAL | Status: DC

## 2015-07-28 NOTE — Telephone Encounter (Signed)
Received Flomax rx on newly est pt. Do you want to route rx to his urologist or is it ok to refill?

## 2015-07-29 MED ORDER — TAMSULOSIN HCL 0.4 MG PO CAPS: 0.4000 mg | ORAL_CAPSULE | Freq: Every day | ORAL | Status: DC

## 2015-07-31 ENCOUNTER — Encounter: Payer: Self-pay | Admitting: Family Medicine

## 2015-07-31 DIAGNOSIS — R7303 Prediabetes: Secondary | ICD-10-CM

## 2015-07-31 HISTORY — DX: Prediabetes: R73.03

## 2015-08-05 ENCOUNTER — Encounter: Payer: Self-pay | Admitting: *Deleted

## 2015-08-10 ENCOUNTER — Encounter: Payer: Self-pay | Admitting: Family Medicine

## 2015-08-29 ENCOUNTER — Encounter: Payer: Self-pay | Admitting: Family Medicine

## 2015-09-09 DIAGNOSIS — H401131 Primary open-angle glaucoma, bilateral, mild stage: Secondary | ICD-10-CM | POA: Diagnosis not present

## 2015-10-05 ENCOUNTER — Other Ambulatory Visit: Payer: Self-pay | Admitting: Gastroenterology

## 2015-10-17 ENCOUNTER — Other Ambulatory Visit: Payer: Self-pay | Admitting: Gastroenterology

## 2015-10-25 ENCOUNTER — Other Ambulatory Visit: Payer: Self-pay

## 2015-10-25 ENCOUNTER — Other Ambulatory Visit: Payer: Self-pay | Admitting: Family Medicine

## 2015-10-25 DIAGNOSIS — R7303 Prediabetes: Secondary | ICD-10-CM

## 2015-10-25 DIAGNOSIS — E785 Hyperlipidemia, unspecified: Secondary | ICD-10-CM

## 2015-10-25 DIAGNOSIS — K219 Gastro-esophageal reflux disease without esophagitis: Secondary | ICD-10-CM

## 2015-10-25 MED ORDER — PANTOPRAZOLE SODIUM 40 MG PO TBEC: 40.0000 mg | DELAYED_RELEASE_TABLET | Freq: Every day | ORAL | Status: DC

## 2015-10-27 ENCOUNTER — Other Ambulatory Visit (INDEPENDENT_AMBULATORY_CARE_PROVIDER_SITE_OTHER): Payer: Medicare Other

## 2015-10-27 DIAGNOSIS — E785 Hyperlipidemia, unspecified: Secondary | ICD-10-CM | POA: Diagnosis not present

## 2015-10-27 DIAGNOSIS — R7303 Prediabetes: Secondary | ICD-10-CM | POA: Diagnosis not present

## 2015-10-27 LAB — COMPREHENSIVE METABOLIC PANEL
ALBUMIN: 4.1 g/dL (ref 3.5–5.2)
ALT: 15 U/L (ref 0–53)
AST: 18 U/L (ref 0–37)
Alkaline Phosphatase: 38 U/L — ABNORMAL LOW (ref 39–117)
BILIRUBIN TOTAL: 0.6 mg/dL (ref 0.2–1.2)
BUN: 12 mg/dL (ref 6–23)
CALCIUM: 9.8 mg/dL (ref 8.4–10.5)
CHLORIDE: 105 meq/L (ref 96–112)
CO2: 30 meq/L (ref 19–32)
CREATININE: 0.98 mg/dL (ref 0.40–1.50)
GFR: 78.38 mL/min (ref >60.00)
Glucose, Bld: 110 mg/dL — ABNORMAL HIGH (ref 70–99)
Potassium: 4.2 mEq/L (ref 3.5–5.1)
SODIUM: 138 meq/L (ref 135–145)
TOTAL PROTEIN: 6.3 g/dL (ref 6.0–8.3)

## 2015-10-27 LAB — LIPID PANEL
CHOLESTEROL: 125 mg/dL (ref 0–200)
HDL: 31.6 mg/dL — ABNORMAL LOW (ref >39.00)
LDL CALC: 70 mg/dL (ref 0–99)
NonHDL: 93.51
TRIGLYCERIDES: 117 mg/dL (ref 0.0–149.0)
Total CHOL/HDL Ratio: 4
VLDL: 23.4 mg/dL (ref 0.0–40.0)

## 2015-10-27 LAB — HEMOGLOBIN A1C: HEMOGLOBIN A1C: 6 % (ref 4.6–6.5)

## 2015-11-04 ENCOUNTER — Ambulatory Visit: Payer: Medicare Other

## 2015-11-08 ENCOUNTER — Ambulatory Visit: Payer: Medicare Other | Admitting: Family Medicine

## 2015-11-11 ENCOUNTER — Ambulatory Visit (INDEPENDENT_AMBULATORY_CARE_PROVIDER_SITE_OTHER): Payer: Medicare Other

## 2015-11-11 VITALS — BP 120/78 | HR 56 | Temp 97.5°F | Ht 71.0 in | Wt 235.0 lb

## 2015-11-11 DIAGNOSIS — Z0000 Encounter for general adult medical examination without abnormal findings: Secondary | ICD-10-CM | POA: Diagnosis not present

## 2015-11-11 NOTE — Progress Notes (Signed)
Subjective:   Joshua Everett is a 81 y.o. male who presents for Medicare Annual/Subsequent preventive examination.  Review of Systems:  N/A Cardiac Risk Factors include: advanced age (>18men, >50 women);male gender;obesity (BMI >30kg/m2);dyslipidemia;hypertension     Objective:    Vitals: BP 120/78 mmHg  Pulse 56  Temp(Src) 97.5 F (36.4 C) (Oral)  Ht 5\' 11"  (1.803 m)  Wt 235 lb (106.595 kg)  BMI 32.79 kg/m2  SpO2 96%  Body mass index is 32.79 kg/(m^2).  Tobacco History  Smoking status  . Former Smoker  . Quit date: 06/05/1968  Smokeless tobacco  . Never Used     Counseling given: No   Past Medical History  Diagnosis Date  . Hyperlipidemia   . GERD (gastroesophageal reflux disease)   . Glaucoma   . Urine incontinence   . History of chicken pox   . Obesity, Class I, BMI 30-34.9 09/08/2014  . Rosacea 07/06/2015  . BPH without obstruction/lower urinary tract symptoms 09/08/2014  . Chronic constipation 09/08/2014  . Prediabetes 07/31/2015   Past Surgical History  Procedure Laterality Date  . Hemorrhoid surgery  1976  . Colonoscopy  03/2009    1 TA, diverticulosis, rpt 5 yrs (Dr [facility] in [facility])  . Esophagogastroduodenoscopy  03/2009    small HH, gastric polyps biopsied, dilation for dysphagia ([facility] [place] [facility])   Family History  Problem Relation Age of Onset  . Diabetes Mother   . Kidney disease Father   . Cancer Sister     breast  . Cancer Father     brain tumor  . Alzheimer's disease Mother 72   History  Sexual Activity  . Sexual Activity: No    Outpatient Encounter Prescriptions as of 11/11/2015  Medication Sig  . B COMPLEX-BIOTIN-FA PO Take by mouth daily.  . bimatoprost (LUMIGAN) 0.01 % SOLN Apply to eye.  . cycloSPORINE (RESTASIS) 0.05 % ophthalmic emulsion Place 1 drop into both eyes 2 (two) times daily.  [name] Kitchen desmopressin (DDAVP) 0.2 MG tablet Take 0.6 mg by mouth daily.  . fenofibrate 160 MG tablet TAKE 1 TABLET DAILY  . GARLIC  OIL PO Take 2 drops by mouth daily.  . Linaclotide (LINZESS) 290 MCG CAPS capsule Take 290 mcg by mouth daily.  . metroNIDAZOLE (METROGEL) 0.75 % gel Apply 1 application topically daily.  . MULTIPLE VITAMIN PO Take by mouth daily.  . OMEGA-3 FATTY ACIDS PO Take 1,000 mcg by mouth daily.  . pantoprazole (PROTONIX) 40 MG tablet Take 1 tablet (40 mg total) by mouth daily.  . polyethylene glycol (MIRALAX / GLYCOLAX) packet Take 17 g by mouth daily as needed.  . simvastatin (ZOCOR) 40 MG tablet TAKE 1 TABLET DAILY AT 6 P.M.  . tamsulosin (FLOMAX) 0.4 MG CAPS capsule Take 1 capsule (0.4 mg total) by mouth daily.  [name] Kitchen tolterodine (DETROL) 1 MG tablet Take 1 mg by mouth at bedtime.   No facility-administered encounter medications on file as of 11/11/2015.    Activities of Daily Living In your present state of health, do you have any difficulty performing the following activities: 11/11/2015 05/10/2015  Hearing? Y N  Vision? N Y  Difficulty concentrating or making decisions? N N  Walking or climbing stairs? N N  Dressing or bathing? N N  Doing errands, shopping? N N  Preparing Food and eating ? N -  Using the Toilet? N -  In the past six months, have you accidently leaked urine? Y -  Do you have  problems with loss of bowel control? N -  Managing your Medications? N -  Managing your Finances? N -  Housekeeping or managing your Housekeeping? N -    Patient Care Team: Ria Bush, MD as PCP - General (Family Medicine) Leandrew Koyanagi, MD as Referring Physician (Ophthalmology) Lucilla Lame, MD as Consulting Physician (Gastroenterology) Franchot Gallo, MD as Consulting Physician (Urology) Ralene Bathe, MD as Consulting Physician (Dermatology)   Assessment:     Hearing Screening   125Hz  250Hz  500Hz  1000Hz  2000Hz  4000Hz  8000Hz   Right ear:   40 0 40 0   Left ear:   40 0 40 0   Vision Screening Comments: Last eye exam in Feb 2017 with Dr. Wallace Going   Exercise Activities and  Dietary recommendations Current Exercise Habits: Home exercise routine, Time (Minutes): 30, Frequency (Times/Week): 7, Weekly Exercise (Minutes/Week): 210, Intensity: Moderate, Exercise limited by: None identified  Goals    . Increase physical activity     Starting 11/11/2015, I will continue to exercise for at least 30 min daily.       Fall Risk Fall Risk  11/11/2015 05/10/2015 11/06/2014  Falls in the past year? No No No   Depression Screen PHQ 2/9 Scores 11/11/2015 05/10/2015 11/06/2014  PHQ - 2 Score 0 0 0    Cognitive Testing MMSE - Mini Mental State Exam 11/11/2015  Orientation to time 5  Orientation to Place 5  Registration 3  Attention/ Calculation 0  Recall 3  Language- name 2 objects 0  Language- repeat 1  Language- follow 3 step command 3  Language- read & follow direction 0  Write a sentence 0  Copy design 0  Total score 20   PLEASE NOTE: A Mini-Cog screen was completed. Maximum score is 20. A value of 0 denotes this part of [place] MMSE was not completed or the patient failed this part of the Mini-Cog screening.   Mini-Cog Screening Orientation to Time - Max 5 pts Orientation to Place - Max 5 pts Registration - Max 3 pts Recall - Max 3 pts Language Repeat - Max 1 pts Language Follow 3 Step Command - Max 3 pts   Immunization History  Administered Date(s) Administered  . Influenza-Unspecified 03/23/2014  . Pneumococcal Conjugate-13 03/23/2014  . Tdap 07/21/2013  . Zoster 05/04/2013   Screening Tests Health Maintenance  Topic Date Due  . PNA vac Low Risk Adult (2 of 2 - PPSV23) 12/20/2015 (Originally 03/24/2015)  . INFLUENZA VACCINE  01/04/2016  . TETANUS/TDAP  07/22/2023  . DTaP/Tdap/Td  Completed  . ZOSTAVAX  Completed      Plan:     I have personally reviewed and addressed the Medicare Annual Wellness questionnaire and have noted the following in the patient's chart:  A. Medical and social history B. Use of alcohol, tobacco or illicit drugs   C. Current medications and supplements D. Functional ability and status E.  Nutritional status F.  Physical activity G. Advance directives H. List of other physicians I.  Hospitalizations, surgeries, and ER visits in previous 12 months J.  [place] to include hearing, vision, cognitive, depression L. Referrals and appointments - none  In addition, I have reviewed and discussed with patient certain preventive protocols, quality metrics, and best practice recommendations. A written personalized care plan for preventive services as well as general preventive health recommendations were provided to patient.  See attached scanned questionnaire for additional information.   Signed,   Lindell Noe, MHA, BS, LPN Health Advisor

## 2015-11-11 NOTE — Progress Notes (Signed)
Pre visit review using our clinic review tool, if applicable. No additional management support is needed unless otherwise documented below in the visit note. 

## 2015-11-11 NOTE — Progress Notes (Signed)
PCP notes:  Health maintenance:   PPSV23 - postponed until CPE; pt feels he has already taken vaccine elsewhere  Abnormal screenings:  Hearing- failed  Patient concerns: Pt states he has chronic constipation and urinary frequency that is disruptive to his sleep pattern.   Nurse concerns: None  Next PCP appt: 12/20/2015 @ 1430

## 2015-11-11 NOTE — Patient Instructions (Signed)
Joshua Everett , Thank you for taking time to come for your Medicare Wellness Visit. I appreciate your ongoing commitment to your health goals. Please review the following plan we discussed and let me know if I can assist you in the future.   These are the goals we discussed: Goals    . Increase physical activity     Starting 11/11/2015, I will continue to exercise for at least 30 min daily.        This is a list of the screening recommended for you and due dates:  Health Maintenance  Topic Date Due  . Pneumonia vaccines (2 of 2 - PPSV23) 03/24/2015  . Flu Shot  01/04/2016  . Tetanus Vaccine  07/22/2023  . DTaP/Tdap/Td vaccine  Completed  . Shingles Vaccine  Completed    Preventive Care for Adults  A healthy lifestyle and preventive care can promote health and wellness. Preventive health guidelines for adults include the following key practices.  . A routine yearly physical is a good way to check with your health care provider about your health and preventive screening. It is a chance to share any concerns and updates on your health and to receive a thorough exam.  . Visit your dentist for a routine exam and preventive care every 6 months. Brush your teeth twice a day and floss once a day. Good oral hygiene prevents tooth decay and gum disease.  . The frequency of eye exams is based on your age, health, family medical history, use  of contact lenses, and other factors. Follow your health care provider's ecommendations for frequency of eye exams.  . Eat a healthy diet. Foods like vegetables, fruits, whole grains, low-fat dairy products, and lean protein foods contain the nutrients you need without too many calories. Decrease your intake of foods high in solid fats, added sugars, and salt. Eat the right amount of calories for you. Get information about a proper diet from your health care provider, if necessary.  . Regular physical exercise is one of the most important things you can do for  your health. Most adults should get at least 150 minutes of moderate-intensity exercise (any activity that increases your heart rate and causes you to sweat) each week. In addition, most adults need muscle-strengthening exercises on 2 or more days a week.  Silver Sneakers may be a benefit available to you. To determine eligibility, you may visit the website: www.silversneakers.com or contact program at 480-752-6786 Mon-Fri between 8AM-8PM.   . Maintain a healthy weight. The body mass index (BMI) is a screening tool to identify possible weight problems. It provides an estimate of body fat based on height and weight. Your health care provider can find your BMI and can help you achieve or maintain a healthy weight.   For adults 20 years and older: ? A BMI below 18.5 is considered underweight. ? A BMI of 18.5 to 24.9 is normal. ? A BMI of 25 to 29.9 is considered overweight. ? A BMI of 30 and above is considered obese.   . Maintain normal blood lipids and cholesterol levels by exercising and minimizing your intake of saturated fat. Eat a balanced diet with plenty of fruit and vegetables. Blood tests for lipids and cholesterol should begin at age 48 and be repeated every 5 years. If your lipid or cholesterol levels are high, you are over 50, or you are at high risk for heart disease, you may need your cholesterol levels checked more frequently. Ongoing high lipid  and cholesterol levels should be treated with medicines if diet and exercise are not working.  . If you smoke, find out from your health care provider how to quit. If you do not use tobacco, please do not start.  . If you choose to drink alcohol, please do not consume more than 2 drinks per day. One drink is considered to be 12 ounces (355 mL) of beer, 5 ounces (148 mL) of wine, or 1.5 ounces (44 mL) of liquor.  . If you are 12-30 years old, ask your health care provider if you should take aspirin to prevent strokes.  . Use sunscreen.  Apply sunscreen liberally and repeatedly throughout the day. You should seek shade when your shadow is shorter than you. Protect yourself by wearing long sleeves, pants, a wide-brimmed hat, and sunglasses year round, whenever you are outdoors.  . Once a month, do a whole body skin exam, using a mirror to look at the skin on your back. Tell your health care provider of new moles, moles that have irregular borders, moles that are larger than a pencil eraser, or moles that have changed in shape or color.

## 2015-11-12 NOTE — Progress Notes (Signed)
I reviewed health advisor's note, was available for consultation, and agree with documentation and plan.  

## 2015-12-13 ENCOUNTER — Other Ambulatory Visit: Payer: Medicare Other

## 2015-12-16 ENCOUNTER — Ambulatory Visit: Payer: Medicare Other | Admitting: Family Medicine

## 2015-12-20 ENCOUNTER — Encounter: Payer: Self-pay | Admitting: Family Medicine

## 2015-12-20 ENCOUNTER — Ambulatory Visit (INDEPENDENT_AMBULATORY_CARE_PROVIDER_SITE_OTHER): Payer: Medicare Other | Admitting: Family Medicine

## 2015-12-20 VITALS — BP 120/62 | HR 60 | Temp 98.4°F | Resp 16 | Ht 71.0 in | Wt 230.0 lb

## 2015-12-20 DIAGNOSIS — K219 Gastro-esophageal reflux disease without esophagitis: Secondary | ICD-10-CM | POA: Diagnosis not present

## 2015-12-20 DIAGNOSIS — K5900 Constipation, unspecified: Secondary | ICD-10-CM | POA: Diagnosis not present

## 2015-12-20 DIAGNOSIS — R7303 Prediabetes: Secondary | ICD-10-CM

## 2015-12-20 DIAGNOSIS — E785 Hyperlipidemia, unspecified: Secondary | ICD-10-CM | POA: Diagnosis not present

## 2015-12-20 DIAGNOSIS — N400 Enlarged prostate without lower urinary tract symptoms: Secondary | ICD-10-CM | POA: Diagnosis not present

## 2015-12-20 DIAGNOSIS — E669 Obesity, unspecified: Secondary | ICD-10-CM

## 2015-12-20 DIAGNOSIS — K5909 Other constipation: Secondary | ICD-10-CM

## 2015-12-20 NOTE — Assessment & Plan Note (Signed)
Chronic, stable. Continue zocor, fibrate, fish oil.

## 2015-12-20 NOTE — Progress Notes (Addendum)
BP 120/62 mmHg  Pulse 60  Temp(Src) 98.4 F (36.9 C) (Oral)  Resp 16  Ht 5\' 11"  (1.803 m)  Wt 230 lb (104.327 kg)  BMI 32.09 kg/m2  SpO2 95%   CC: CPE  Subjective:    Patient ID: Cy Blamer, male    DOB: 12-Mar-1935, 81 y.o.   MRN: [id number]  HPI: Daiquan Senerchia is a 81 y.o. male presenting on 12/20/2015 for Annual Exam   Established care at our office 06/2015. Saw Lesia last week for medicare wellness visit. Note reviewed. Here for f/u. Failed hearing screen. Pt has not noticed any hearing trouble.   Desmopressin for bedwetting for the past year - takes 3 nightly. Followed by Dr Diona Fanti. Nocturia x4. Wears depens day and night. Started on Detrol in addition to desmopressin and flomax. Overall feels stable period. Insurance did not approve myrbetriq.   Chronic constipation disruptive to sleep pattern. Takes linzess daily as well as miralax PRN. Rare MOM. Going daily. Does well with fluids and fiber.   GERD - complaint with protonix 40mg  daily with good control.   HLD - on fenofibrate, simvastatin, fish oil daily without myalgias.  Preventative: COLONOSCOPY Date: 2010 stable, rpt 5 yrs (h/o polyps) Dr Acquanetta [facility] in [facility], complicated by marked bradycardia s/p ER evaluation.  Declines flu shot. prevnar 03/2014, pneumovax 2009 Tdap 2015 zostavax 04/2013 Advanced planning discussion - has living will at home. HCPOA - wife then oldest son. Asked to bring me copy.   Lives with wife Occ: Nature conservation officer, retired Edu: MBA S[place] Activity: golfs regularly Diet: good water, fruits/vegetables daily  Relevant past medical, surgical, family and social history reviewed and updated as indicated. Interim medical history since our last visit reviewed. Allergies and medications reviewed and updated. Current Outpatient Prescriptions on File Prior to Visit  Medication Sig  . B COMPLEX-BIOTIN-FA PO Take by mouth daily.  . bimatoprost (LUMIGAN) 0.01 % SOLN Apply to eye.    . cycloSPORINE (RESTASIS) 0.05 % ophthalmic emulsion Place 1 drop into both eyes 2 (two) times daily.  [name] Kitchen desmopressin (DDAVP) 0.2 MG tablet Take 0.6 mg by mouth daily.  . fenofibrate 160 MG tablet TAKE 1 TABLET DAILY  . GARLIC OIL PO Take 2 drops by mouth daily.  . Linaclotide (LINZESS) 290 MCG CAPS capsule Take 290 mcg by mouth daily.  . metroNIDAZOLE (METROGEL) 0.75 % gel Apply 1 application topically daily.  . MULTIPLE VITAMIN PO Take by mouth daily.  . OMEGA-3 FATTY ACIDS PO Take 1,000 mcg by mouth daily.  . pantoprazole (PROTONIX) 40 MG tablet Take 1 tablet (40 mg total) by mouth daily.  . polyethylene glycol (MIRALAX / GLYCOLAX) packet Take 17 g by mouth daily as needed.  . simvastatin (ZOCOR) 40 MG tablet TAKE 1 TABLET DAILY AT 6 P.M.  . tamsulosin (FLOMAX) 0.4 MG CAPS capsule Take 1 capsule (0.4 mg total) by mouth daily.  [name] Kitchen tolterodine (DETROL) 1 MG tablet Take 1 mg by mouth at bedtime.   No current facility-administered medications on file prior to visit.    Review of Systems Per HPI unless specifically indicated in ROS section     Objective:    BP 120/62 mmHg  Pulse 60  Temp(Src) 98.4 F (36.9 C) (Oral)  Resp 16  Ht 5\' 11"  (1.803 m)  Wt 230 lb (104.327 kg)  BMI 32.09 kg/m2  SpO2 95%  Wt Readings from Last 3 Encounters:  12/20/15 230 lb (104.327 kg)  11/11/15 235 lb (106.595 kg)  07/06/15 236 lb 12 oz (107.389 kg)    Physical Exam  Constitutional: He is oriented to person, place, and time. He appears well-developed and well-nourished. No distress.  HENT:  Head: Normocephalic and atraumatic.  Right Ear: Hearing, tympanic membrane, external ear and ear canal normal.  Left Ear: Hearing, tympanic membrane, external ear and ear canal normal.  Nose: Nose normal.  Mouth/Throat: Uvula is midline, oropharynx is clear and moist and mucous membranes are normal. No oropharyngeal exudate, posterior oropharyngeal edema or posterior oropharyngeal erythema.  Eyes: Conjunctivae  and EOM are normal. Pupils are equal, round, and reactive to light. No scleral icterus.  Neck: Normal range of motion. Neck supple. Carotid bruit is not present. No thyromegaly present.  Cardiovascular: Normal rate, regular rhythm, normal heart sounds and intact distal pulses.   No murmur heard. Pulses:      Radial pulses are 2+ on the right side, and 2+ on the left side.  Pulmonary/Chest: Effort normal and breath sounds normal. No respiratory distress. He has no wheezes. He has no rales.  Abdominal: Soft. Bowel sounds are normal. He exhibits no distension and no mass. There is no tenderness. There is no rebound and no guarding.  Musculoskeletal: Normal range of motion. He exhibits no edema.  Lymphadenopathy:    He has no cervical adenopathy.  Neurological: He is alert and oriented to person, place, and time.  CN grossly intact, station and gait intact  Skin: Skin is warm and dry. No rash noted.  Psychiatric: He has a normal mood and affect. His behavior is normal. Judgment and thought content normal.  Nursing note and vitals reviewed.  Results for orders placed or performed in visit on 10/27/15  Lipid panel  Result Value Ref Range   Cholesterol 125 0 - 200 mg/dL   Triglycerides 117.0 0.0 - 149.0 mg/dL   HDL 31.60 (L) >39.00 mg/dL   VLDL 23.4 0.0 - 40.0 mg/dL   LDL Cholesterol 70 0 - 99 mg/dL   Total CHOL/HDL Ratio 4    NonHDL 93.51   Comprehensive metabolic panel  Result Value Ref Range   Sodium 138 135 - 145 mEq/L   Potassium 4.2 3.5 - 5.1 mEq/L   Chloride 105 96 - 112 mEq/L   CO2 30 19 - 32 mEq/L   Glucose, Bld 110 (H) 70 - 99 mg/dL   BUN 12 6 - 23 mg/dL   Creatinine, Ser 0.98 0.40 - 1.50 mg/dL   Total Bilirubin 0.6 0.2 - 1.2 mg/dL   Alkaline Phosphatase 38 (L) 39 - 117 U/L   AST 18 0 - 37 U/L   ALT 15 0 - 53 U/L   Total Protein 6.3 6.0 - 8.3 g/dL   Albumin 4.1 3.5 - 5.2 g/dL   Calcium 9.8 8.4 - 10.5 mg/dL   GFR 78.38 >60.00 mL/min  Hemoglobin A1c  Result Value Ref  Range   Hgb A1c MFr Bld 6.0 4.6 - 6.5 %      Assessment & Plan:   Problem List Items Addressed This Visit    BPH without obstruction/lower urinary tract symptoms    Appreciate urology care of patient. Continue flomax, desmopressin, detrol      Chronic constipation    Continue linzess daily with miralax and MOM PRN. Discussed importance of water and fiber in diet.      Dyslipidemia - Primary    Chronic, stable. Continue zocor, fibrate, fish oil.       GERD (gastroesophageal reflux disease)    Doing  well with protonix 40mg  daily. Discussed trial QOD dosing.       Obesity, Class I, BMI 30-34.9    Weight loss noted.       Prediabetes    Discussed diet changes to maintain glycemic control           Follow up plan: Return in about 1 year (around 12/19/2016), or as needed, for medicare wellness visit.  Ria Bush, MD

## 2015-12-20 NOTE — Addendum Note (Signed)
Addended by: Ria Bush on: 12/20/2015 03:51 PM   Modules accepted: Miquel Dunn

## 2015-12-20 NOTE — Assessment & Plan Note (Signed)
Discussed diet changes to maintain glycemic control

## 2015-12-20 NOTE — Assessment & Plan Note (Signed)
Weight loss noted  

## 2015-12-20 NOTE — Assessment & Plan Note (Signed)
Continue linzess daily with miralax and MOM PRN. Discussed importance of water and fiber in diet.

## 2015-12-20 NOTE — Patient Instructions (Addendum)
Bring me copy of your living will to update your chart. You are doing well today. Watch added sugars.  Return as needed or in 1 year for next check up.   Health Maintenance, Male A healthy lifestyle and preventative care can promote health and wellness.  Maintain regular health, dental, and eye exams.  Eat a healthy diet. Foods like vegetables, fruits, whole grains, low-fat dairy products, and lean protein foods contain the nutrients you need and are low in calories. Decrease your intake of foods high in solid fats, added sugars, and salt. Get information about a proper diet from your health care provider, if necessary.  Regular physical exercise is one of the most important things you can do for your health. Most adults should get at least 150 minutes of moderate-intensity exercise (any activity that increases your heart rate and causes you to sweat) each week. In addition, most adults need muscle-strengthening exercises on 2 or more days a week.   Maintain a healthy weight. The body mass index (BMI) is a screening tool to identify possible weight problems. It provides an estimate of body fat based on height and weight. Your health care provider can find your BMI and can help you achieve or maintain a healthy weight. For males 20 years and older:  A BMI below 18.5 is considered underweight.  A BMI of 18.5 to 24.9 is normal.  A BMI of 25 to 29.9 is considered overweight.  A BMI of 30 and above is considered obese.  Maintain normal blood lipids and cholesterol by exercising and minimizing your intake of saturated fat. Eat a balanced diet with plenty of fruits and vegetables. Blood tests for lipids and cholesterol should begin at age 14 and be repeated every 5 years. If your lipid or cholesterol levels are high, you are over age 7, or you are at high risk for heart disease, you may need your cholesterol levels checked more frequently.Ongoing high lipid and cholesterol levels should be treated  with medicines if diet and exercise are not working.  If you smoke, find out from your health care provider how to quit. If you do not use tobacco, do not start.  Lung cancer screening is recommended for adults aged 63-80 years who are at high risk for developing lung cancer because of a history of smoking. A yearly low-dose CT scan of the lungs is recommended for people who have at least a 30-pack-year history of smoking and are current smokers or have quit within the past 15 years. A pack year of smoking is smoking an average of 1 pack of cigarettes a day for 1 year (for example, a 30-pack-year history of smoking could mean smoking 1 pack a day for 30 years or 2 packs a day for 15 years). Yearly screening should continue until the smoker has stopped smoking for at least 15 years. Yearly screening should be stopped for people who develop a health problem that would prevent them from having lung cancer treatment.  If you choose to drink alcohol, do not have more than 2 drinks per day. One drink is considered to be 12 oz (360 mL) of beer, 5 oz (150 mL) of wine, or 1.5 oz (45 mL) of liquor.  Avoid the use of street drugs. Do not share needles with anyone. Ask for help if you need support or instructions about stopping the use of drugs.  High blood pressure causes heart disease and increases the risk of stroke. High blood pressure is more likely  to develop in:  People who have blood pressure in the end of the normal range (100-139/85-89 mm Hg).  People who are overweight or obese.  People who are African American.  If you are 68-44 years of age, have your blood pressure checked every 3-5 years. If you are 39 years of age or older, have your blood pressure checked every year. You should have your blood pressure measured twice--once when you are at a hospital or clinic, and once when you are not at a hospital or clinic. Record the average of the two measurements. To check your blood pressure when you  are not at a hospital or clinic, you can use:  An automated blood pressure machine at a pharmacy.  A home blood pressure monitor.  If you are 79-51 years old, ask your health care provider if you should take aspirin to prevent heart disease.  Diabetes screening involves taking a blood sample to check your fasting blood sugar level. This should be done once every 3 years after age 88 if you are at a normal weight and without risk factors for diabetes. Testing should be considered at a younger age or be carried out more frequently if you are overweight and have at least 1 risk factor for diabetes.  Colorectal cancer can be detected and often prevented. Most routine colorectal cancer screening begins at the age of 48 and continues through age 43. However, your health care provider may recommend screening at an earlier age if you have risk factors for colon cancer. On a yearly basis, your health care provider may provide home test kits to check for hidden blood in the stool. A small camera at the end of a tube may be used to directly examine the colon (sigmoidoscopy or colonoscopy) to detect the earliest forms of colorectal cancer. Talk to your health care provider about this at age 36 when routine screening begins. A direct exam of the colon should be repeated every 5-10 years through age 93, unless early forms of precancerous polyps or small growths are found.  People who are at an increased risk for hepatitis B should be screened for this virus. You are considered at high risk for hepatitis B if:  You were born in a country where hepatitis B occurs often. Talk with your health care provider about which countries are considered high risk.  Your parents were born in a high-risk country and you have not received a shot to protect against hepatitis B (hepatitis B vaccine).  You have HIV or AIDS.  You use needles to inject street drugs.  You live with, or have sex with, someone who has hepatitis  B.  You are a man who has sex with other men (MSM).  You get hemodialysis treatment.  You take certain medicines for conditions like cancer, organ transplantation, and autoimmune conditions.  Hepatitis C blood testing is recommended for all people born from [birth year] through 1963 and any individual with known risk factors for hepatitis C.  Healthy men should no longer receive prostate-specific antigen (PSA) blood tests as part of routine cancer screening. Talk to your health care provider about prostate cancer screening.  Testicular cancer screening is not recommended for adolescents or adult males who have no symptoms. Screening includes self-exam, a health care provider exam, and other screening tests. Consult with your health care provider about any symptoms you have or any concerns you have about testicular cancer.  Practice safe sex. Use condoms and avoid high-risk sexual practices to  reduce the spread of sexually transmitted infections (STIs).  You should be screened for STIs, including gonorrhea and chlamydia if:  You are sexually active and are younger than 24 years.  You are older than 24 years, and your health care provider tells you that you are at risk for this type of infection.  Your sexual activity has changed since you were last screened, and you are at an increased risk for chlamydia or gonorrhea. Ask your health care provider if you are at risk.  If you are at risk of being infected with HIV, it is recommended that you take a prescription medicine daily to prevent HIV infection. This is called pre-exposure prophylaxis (PrEP). You are considered at risk if:  You are a man who has sex with other men (MSM).  You are a heterosexual man who is sexually active with multiple partners.  You take drugs by injection.  You are sexually active with a partner who has HIV.  Talk with your health care provider about whether you are at high risk of being infected with HIV. If you  choose to begin PrEP, you should first be tested for HIV. You should then be tested every 3 months for as long as you are taking PrEP.  Use sunscreen. Apply sunscreen liberally and repeatedly throughout the day. You should seek shade when your shadow is shorter than you. Protect yourself by wearing long sleeves, pants, a wide-brimmed hat, and sunglasses year round whenever you are outdoors.  Tell your health care provider of new moles or changes in moles, especially if there is a change in shape or color. Also, tell your health care provider if a mole is larger than the size of a pencil eraser.  A one-time screening for abdominal aortic aneurysm (AAA) and surgical repair of large AAAs by ultrasound is recommended for men aged 37-75 years who are current or former smokers.  Stay current with your vaccines (immunizations).   This information is not intended to replace advice given to you by your health care provider. Make sure you discuss any questions you have with your health care provider.   Document Released: 11/18/2007 Document Revised: 06/12/2014 Document Reviewed: 10/17/2010 Elsevier Interactive Patient Education [employer].

## 2015-12-20 NOTE — Assessment & Plan Note (Signed)
Appreciate urology care of patient. Continue flomax, desmopressin, detrol

## 2015-12-20 NOTE — Assessment & Plan Note (Signed)
Doing well with protonix 40mg  daily. Discussed trial QOD dosing.

## 2016-01-18 ENCOUNTER — Telehealth: Payer: Self-pay | Admitting: Family Medicine

## 2016-01-18 DIAGNOSIS — Z7189 Other specified counseling: Secondary | ICD-10-CM

## 2016-01-18 NOTE — Telephone Encounter (Signed)
Pt wife dropped off DNR for your records. I scanned into his documents and put paper copy on cart for drop off.

## 2016-01-23 DIAGNOSIS — Z7189 Other specified counseling: Secondary | ICD-10-CM | POA: Insufficient documentation

## 2016-01-23 NOTE — Assessment & Plan Note (Signed)
Advanced directives - scanned 01/2016. No life prolonging measures if terminal irreversible condition. No HCPOA named.  

## 2016-02-04 ENCOUNTER — Telehealth: Payer: Self-pay | Admitting: Gastroenterology

## 2016-02-04 NOTE — Telephone Encounter (Signed)
Patient called and stated that [facility] has a RX for Linzess that is waiting for a Dr. Serena Croissant. He has enough until you come back Wednesday.

## 2016-02-08 ENCOUNTER — Encounter: Payer: Self-pay | Admitting: Gastroenterology

## 2016-02-08 ENCOUNTER — Other Ambulatory Visit: Payer: Self-pay

## 2016-02-08 MED ORDER — FENOFIBRATE 160 MG PO TABS: 160.0000 mg | ORAL_TABLET | Freq: Every day | ORAL | 3 refills | Status: DC

## 2016-02-08 NOTE — Telephone Encounter (Signed)
Mrs Lagrassa [facility] signed request [facility] to [facility]. Pt last seen 12/20/15 and refill done per protocol. Mrs Sandberg voiced understanding.

## 2016-02-09 ENCOUNTER — Other Ambulatory Visit: Payer: Self-pay

## 2016-02-09 DIAGNOSIS — K5909 Other constipation: Secondary | ICD-10-CM

## 2016-02-09 DIAGNOSIS — H25813 Combined forms of age-related cataract, bilateral: Secondary | ICD-10-CM | POA: Diagnosis not present

## 2016-02-09 DIAGNOSIS — H524 Presbyopia: Secondary | ICD-10-CM | POA: Diagnosis not present

## 2016-02-09 MED ORDER — LINACLOTIDE 290 MCG PO CAPS: 290.0000 ug | ORAL_CAPSULE | Freq: Every day | ORAL | 3 refills | Status: DC

## 2016-02-09 NOTE — Telephone Encounter (Signed)
Rx for Lin[facility]ess sent to [facility] per pt request.

## 2016-02-15 NOTE — Telephone Encounter (Signed)
error 

## 2016-03-30 DIAGNOSIS — H401131 Primary open-angle glaucoma, bilateral, mild stage: Secondary | ICD-10-CM | POA: Diagnosis not present

## 2016-06-07 ENCOUNTER — Other Ambulatory Visit: Payer: Self-pay | Admitting: Family Medicine

## 2016-07-05 DIAGNOSIS — N401 Enlarged prostate with lower urinary tract symptoms: Secondary | ICD-10-CM | POA: Diagnosis not present

## 2016-07-05 DIAGNOSIS — R351 Nocturia: Secondary | ICD-10-CM | POA: Diagnosis not present

## 2016-07-23 ENCOUNTER — Other Ambulatory Visit: Payer: Self-pay | Admitting: Family Medicine

## 2016-08-17 ENCOUNTER — Observation Stay (HOSPITAL_BASED_OUTPATIENT_CLINIC_OR_DEPARTMENT_OTHER): Payer: Medicare Other

## 2016-08-17 ENCOUNTER — Emergency Department: Payer: Medicare Other

## 2016-08-17 ENCOUNTER — Observation Stay
Admission: EM | Admit: 2016-08-17 | Discharge: 2016-08-17 | Disposition: A | Discharge status: Home / Self Care | Payer: Medicare Other | Attending: Internal Medicine | Admitting: Internal Medicine

## 2016-08-17 ENCOUNTER — Telehealth: Payer: Self-pay

## 2016-08-17 ENCOUNTER — Observation Stay: Admit: 2016-08-17 | Payer: Medicare Other

## 2016-08-17 DIAGNOSIS — L719 Rosacea, unspecified: Secondary | ICD-10-CM | POA: Insufficient documentation

## 2016-08-17 DIAGNOSIS — R7303 Prediabetes: Secondary | ICD-10-CM | POA: Diagnosis not present

## 2016-08-17 DIAGNOSIS — R079 Chest pain, unspecified: Secondary | ICD-10-CM | POA: Diagnosis not present

## 2016-08-17 DIAGNOSIS — E785 Hyperlipidemia, unspecified: Secondary | ICD-10-CM | POA: Diagnosis not present

## 2016-08-17 DIAGNOSIS — R0789 Other chest pain: Secondary | ICD-10-CM | POA: Diagnosis not present

## 2016-08-17 DIAGNOSIS — R32 Unspecified urinary incontinence: Secondary | ICD-10-CM | POA: Insufficient documentation

## 2016-08-17 DIAGNOSIS — R072 Precordial pain: Secondary | ICD-10-CM | POA: Diagnosis not present

## 2016-08-17 DIAGNOSIS — H409 Unspecified glaucoma: Secondary | ICD-10-CM | POA: Diagnosis not present

## 2016-08-17 DIAGNOSIS — Z6834 Body mass index (BMI) 34.0-34.9, adult: Secondary | ICD-10-CM | POA: Diagnosis not present

## 2016-08-17 DIAGNOSIS — K219 Gastro-esophageal reflux disease without esophagitis: Secondary | ICD-10-CM | POA: Insufficient documentation

## 2016-08-17 DIAGNOSIS — Z87891 Personal history of nicotine dependence: Secondary | ICD-10-CM | POA: Diagnosis not present

## 2016-08-17 DIAGNOSIS — N401 Enlarged prostate with lower urinary tract symptoms: Secondary | ICD-10-CM | POA: Insufficient documentation

## 2016-08-17 DIAGNOSIS — E669 Obesity, unspecified: Secondary | ICD-10-CM | POA: Diagnosis present

## 2016-08-17 DIAGNOSIS — Z7982 Long term (current) use of aspirin: Secondary | ICD-10-CM | POA: Insufficient documentation

## 2016-08-17 DIAGNOSIS — N400 Enlarged prostate without lower urinary tract symptoms: Secondary | ICD-10-CM | POA: Diagnosis not present

## 2016-08-17 DIAGNOSIS — E663 Overweight: Secondary | ICD-10-CM | POA: Diagnosis present

## 2016-08-17 DIAGNOSIS — K5909 Other constipation: Secondary | ICD-10-CM | POA: Diagnosis not present

## 2016-08-17 DIAGNOSIS — Z79899 Other long term (current) drug therapy: Secondary | ICD-10-CM | POA: Insufficient documentation

## 2016-08-17 DIAGNOSIS — Z833 Family history of diabetes mellitus: Secondary | ICD-10-CM | POA: Diagnosis not present

## 2016-08-17 LAB — CBC
HCT: 42.9 % (ref 40.0–52.0)
Hemoglobin: 14.8 g/dL (ref 13.0–18.0)
MCH: 29.6 pg (ref 26.0–34.0)
MCHC: 34.4 g/dL (ref 32.0–36.0)
MCV: 86 fL (ref 80.0–100.0)
PLATELETS: 200 10*3/uL (ref 150–440)
RBC: 4.99 MIL/uL (ref 4.40–5.90)
RDW: 13 % (ref 11.5–14.5)
WBC: 9.8 10*3/uL (ref 3.8–10.6)

## 2016-08-17 LAB — BASIC METABOLIC PANEL
Anion gap: 5 (ref 5–15)
BUN: 14 mg/dL (ref 6–20)
CALCIUM: 9.8 mg/dL (ref 8.9–10.3)
CO2: 28 mmol/L (ref 22–32)
Chloride: 104 mmol/L (ref 101–111)
Creatinine, Ser: 0.88 mg/dL (ref 0.61–1.24)
GFR calc Af Amer: >60 mL/min (ref >60)
Glucose, Bld: 156 mg/dL — ABNORMAL HIGH (ref 65–99)
POTASSIUM: 3.8 mmol/L (ref 3.5–5.1)
SODIUM: 137 mmol/L (ref 135–145)

## 2016-08-17 LAB — NM MYOCAR MULTI W/SPECT W/WALL MOTION / EF
CHL CUP NUCLEAR SRS: 17
CHL CUP NUCLEAR SSS: 0
CHL CUP RESTING HR STRESS: 56 {beats}/min
LV dias vol: 83 mL (ref 62–150)
LV sys vol: 25 mL
NUC STRESS TID: 1.06
Peak HR: 78 {beats}/min
Percent HR: 55 %
SDS: 0

## 2016-08-17 LAB — GLUCOSE, CAPILLARY: Glucose-Capillary: 116 mg/dL — ABNORMAL HIGH (ref 65–99)

## 2016-08-17 LAB — TROPONIN I: Troponin I: <0.03 ng/mL (ref <0.03)

## 2016-08-17 LAB — MRSA PCR SCREENING: MRSA by PCR: NEGATIVE

## 2016-08-17 MED ORDER — ASPIRIN EC 81 MG PO TBEC: 81.0000 mg | DELAYED_RELEASE_TABLET | Freq: Every day | ORAL | Status: DC

## 2016-08-17 MED ORDER — LATANOPROST 0.005 % OP SOLN
1.0000 [drp] | Freq: Every day | OPHTHALMIC | Status: DC
  Filled 2016-08-17: qty 2.5

## 2016-08-17 MED ORDER — MIRABEGRON ER 50 MG PO TB24
50.0000 mg | ORAL_TABLET | Freq: Every day | ORAL | Status: DC
  Filled 2016-08-17: qty 1

## 2016-08-17 MED ORDER — ACETAMINOPHEN 325 MG PO TABS: 650.0000 mg | ORAL_TABLET | ORAL | Status: DC | PRN

## 2016-08-17 MED ORDER — TAMSULOSIN HCL 0.4 MG PO CAPS
0.4000 mg | ORAL_CAPSULE | Freq: Every day | ORAL | Status: DC
  Administered 2016-08-17: 0.4 mg via ORAL
  Filled 2016-08-17: qty 1

## 2016-08-17 MED ORDER — SODIUM CHLORIDE 0.9 % IV SOLN: 250.0000 mL | INTRAVENOUS | Status: DC | PRN

## 2016-08-17 MED ORDER — GI COCKTAIL ~~LOC~~
30.0000 mL | Freq: Once | ORAL | Status: AC
  Administered 2016-08-17: 30 mL via ORAL
  Filled 2016-08-17: qty 30

## 2016-08-17 MED ORDER — SODIUM CHLORIDE 0.9% FLUSH: 3.0000 mL | INTRAVENOUS | Status: DC | PRN

## 2016-08-17 MED ORDER — DESMOPRESSIN ACETATE 0.2 MG PO TABS
0.6000 mg | ORAL_TABLET | Freq: Every day | ORAL | Status: DC
Start: 2016-08-17 — End: 2016-08-17
  Filled 2016-08-17: qty 3

## 2016-08-17 MED ORDER — SIMVASTATIN 10 MG PO TABS
20.0000 mg | ORAL_TABLET | Freq: Every day | ORAL | Status: DC
  Filled 2016-08-17: qty 2

## 2016-08-17 MED ORDER — CYCLOSPORINE 0.05 % OP EMUL
1.0000 [drp] | Freq: Two times a day (BID) | OPHTHALMIC | Status: DC
  Administered 2016-08-17: 1 [drp] via OPHTHALMIC
  Filled 2016-08-17 (×2): qty 1

## 2016-08-17 MED ORDER — REGADENOSON 0.4 MG/5ML IV SOLN
0.4000 mg | Freq: Once | INTRAVENOUS | Status: AC
  Administered 2016-08-17: 0.4 mg via INTRAVENOUS
  Filled 2016-08-17: qty 5

## 2016-08-17 MED ORDER — SODIUM CHLORIDE 0.9% FLUSH
3.0000 mL | Freq: Two times a day (BID) | INTRAVENOUS | Status: DC
  Administered 2016-08-17: 3 mL via INTRAVENOUS

## 2016-08-17 MED ORDER — PANTOPRAZOLE SODIUM 40 MG PO TBEC
40.0000 mg | DELAYED_RELEASE_TABLET | Freq: Every day | ORAL | Status: DC
  Administered 2016-08-17: 40 mg via ORAL
  Filled 2016-08-17 (×2): qty 1

## 2016-08-17 MED ORDER — FENOFIBRATE 160 MG PO TABS
160.0000 mg | ORAL_TABLET | Freq: Every day | ORAL | Status: DC
  Administered 2016-08-17: 160 mg via ORAL
  Filled 2016-08-17: qty 1

## 2016-08-17 MED ORDER — ASPIRIN 300 MG RE SUPP
300.0000 mg | RECTAL | Status: AC
  Administered 2016-08-17: 300 mg via RECTAL

## 2016-08-17 MED ORDER — ONDANSETRON HCL 4 MG/2ML IJ SOLN: 4.0000 mg | Freq: Four times a day (QID) | INTRAMUSCULAR | Status: DC | PRN

## 2016-08-17 MED ORDER — ASPIRIN 81 MG PO CHEW
324.0000 mg | CHEWABLE_TABLET | ORAL | Status: AC
  Filled 2016-08-17: qty 4

## 2016-08-17 MED ORDER — VITAMIN D 1000 UNITS PO TABS
2000.0000 [IU] | ORAL_TABLET | Freq: Every day | ORAL | Status: DC
  Administered 2016-08-17: 2000 [IU] via ORAL
  Filled 2016-08-17: qty 2

## 2016-08-17 MED ORDER — TECHNETIUM TC 99M TETROFOSMIN IV KIT
31.5600 | PACK | Freq: Once | INTRAVENOUS | Status: AC | PRN
  Administered 2016-08-17: 31.56 via INTRAVENOUS

## 2016-08-17 MED ORDER — TECHNETIUM TC 99M TETROFOSMIN IV KIT
11.8800 | PACK | Freq: Once | INTRAVENOUS | Status: AC | PRN
  Administered 2016-08-17: 11.88 via INTRAVENOUS

## 2016-08-17 MED ORDER — NITROGLYCERIN 0.4 MG SL SUBL: 0.4000 mg | SUBLINGUAL_TABLET | SUBLINGUAL | Status: DC | PRN

## 2016-08-17 MED ORDER — ENOXAPARIN SODIUM 40 MG/0.4ML ~~LOC~~ SOLN
40.0000 mg | SUBCUTANEOUS | Status: DC
  Administered 2016-08-17: 40 mg via SUBCUTANEOUS
  Filled 2016-08-17: qty 0.4

## 2016-08-17 MED ORDER — OMEGA-3-ACID ETHYL ESTERS 1 G PO CAPS
1.0000 g | ORAL_CAPSULE | Freq: Every day | ORAL | Status: DC
  Administered 2016-08-17: 1 g via ORAL
  Filled 2016-08-17: qty 1

## 2016-08-17 NOTE — Telephone Encounter (Signed)
Mrs Mckimmy left v/m(DPR signed) requesting copy of DNR which is under media tab dated 01/18/16. I think the hospital will need an original but if they need a copy [facility] has access to this chart. Anything further let us know. I will send the note to Dr Darnell Level so he is a ware pt in [facility].

## 2016-08-17 NOTE — ED Provider Notes (Signed)
[facility] Emergency Department Provider Note  ____________________________________________   None    (approximate)  I have reviewed the triage vital signs and the nursing notes.   HISTORY  Chief Complaint Chest Pain    HPI Joshua Everett is a 82 y.o. male who comes to the emergency department via EMS with chest pain. He was lying awake in bed at 11 PM when he began to feel "something" in his lower mid chest. He said it felt like he needed an antacid and he took 2 Alka-Seltzer which did not adequately help his pain. He took several drinks of water which also did not help. He then woke his wife up at 3:30 in the morning and she called 911. EMS gave him baby aspirin and 2 sprays of nitroglycerin which helped and they then applied a nitroglycerin patch. The patient has never had a heart attack or stroke. He's never had a diagnosis of hypertension or diabetes. He is a former smoker. His pain is been completely constant since it began at 11 PM and was not exertional despite him getting up and walking around. He's had no shortness of breath.   Past Medical History:  Diagnosis Date  . BPH without obstruction/lower urinary tract symptoms 09/08/2014  . Chronic constipation 09/08/2014  . GERD (gastroesophageal reflux disease)   . Glaucoma   . History of chicken pox   . Hyperlipidemia   . Obesity, Class I, BMI 30-34.9 09/08/2014  . Prediabetes 07/31/2015  . Rosacea 07/06/2015  . Urine incontinence     Patient Active Problem List   Diagnosis Date Noted  . Advanced care planning/counseling discussion 01/23/2016  . Prediabetes 07/31/2015  . Rosacea 07/06/2015  . Abdominal wall hernia 09/08/2014  . BPH without obstruction/lower urinary tract symptoms 09/08/2014  . Chronic constipation 09/08/2014  . Dyslipidemia 09/08/2014  . Family history of diabetes mellitus 09/08/2014  . Glaucoma 09/08/2014  . GERD (gastroesophageal reflux disease) 09/08/2014  . Obesity,  Class I, BMI 30-34.9 09/08/2014  . FOM (frequency of micturition) 09/08/2014    Past Surgical History:  Procedure Laterality Date  . COLONOSCOPY  03/2009   1 TA, diverticulosis, rpt 5 yrs (Dr [facility] in [facility])  . ESOPHAGOGASTRODUODENOSCOPY  03/2009   small HH, gastric polyps biopsied, dilation for dysphagia ([facility] [place])  . [place]    Prior to Admission medications   Medication Sig Start Date End Date Taking? Authorizing Provider  bimatoprost (LUMIGAN) 0.01 % SOLN Place 1 drop into both eyes at bedtime.    Yes Historical Provider, MD  cholecalciferol (VITAMIN D) 1000 units tablet Take 2,000 Units by mouth daily.   Yes Historical Provider, MD  cycloSPORINE (RESTASIS) 0.05 % ophthalmic emulsion Place 1 drop into both eyes 2 (two) times daily.   Yes Historical Provider, MD  desmopressin (DDAVP) 0.2 MG tablet Take 0.6 mg by mouth at bedtime.    Yes Historical Provider, MD  fenofibrate 160 MG tablet Take 1 tablet (160 mg total) by mouth daily. 02/08/16  Yes Ria Bush, MD  linaclotide [facility]) 290 MCG CAPS capsule Take 1 capsule (290 mcg total) by mouth daily. 02/09/16  Yes Lucilla Lame, MD  mirabegron ER (MYRBETRIQ) 50 MG TB24 tablet Take 50 mg by mouth at bedtime.   Yes Historical Provider, MD  omega-3 acid ethyl esters (LOVAZA) 1 g capsule Take 1 g by mouth daily.   Yes Historical Provider, MD  pantoprazole (PROTONIX) 40 MG tablet Take 1 tablet (40  mg total) by mouth daily. 10/25/15  Yes Darren Allen Norris, MD  simvastatin (ZOCOR) 40 MG tablet TAKE 1 TABLET DAILY AT 6 P.M. 06/07/16  Yes Ria Bush, MD  tamsulosin (FLOMAX) 0.4 MG CAPS capsule TAKE 1 CAPSULE DAILY 07/24/16  Yes Ria Bush, MD    Allergies Patient has no known allergies.  Family History  Problem Relation Age of Onset  . Diabetes Mother   . Alzheimer's disease Mother 57  . Kidney disease Father   . Cancer Father     brain tumor  . Cancer Sister     breast    Social  History Social History  Substance Use Topics  . Smoking status: Former Smoker    Quit date: 06/05/1968  . Smokeless tobacco: Never Used  . Alcohol use 0.0 oz/week     Comment: minimal    Review of Systems Constitutional: No fever/chills Eyes: No visual changes. ENT: No sore throat. Cardiovascular: Positive chest pain. Respiratory: Denies shortness of breath. Gastrointestinal: No abdominal pain.  No nausea, no vomiting.  No diarrhea.  No constipation. Genitourinary: Negative for dysuria. Musculoskeletal: Negative for back pain. Skin: Negative for rash. Neurological: Negative for headaches, focal weakness or numbness.  10-point ROS otherwise negative.  ____________________________________________   PHYSICAL EXAM:  VITAL SIGNS: ED Triage Vitals  Enc Vitals Group     BP      Pulse      Resp      Temp      Temp src      SpO2      Weight      Height      Head Circumference      Peak Flow      Pain Score      Pain Loc      Pain Edu?      Excl. in [place]?     Constitutional: Alert and oriented x 4 Appears somewhat uncomfortable but no respiratory distress Eyes: PERRL EOMI. Head: Atraumatic. Nose: No congestion/rhinnorhea. Mouth/Throat: No trismus Neck: No stridor.   Cardiovascular: Normal rate, regular rhythm. Grossly normal heart sounds.  Good peripheral circulation. Respiratory: Normal respiratory effort.  No retractions. Lungs CTAB and moving good air Gastrointestinal: Soft nondistended nontender no rebound no guarding no peritonitis no McBurney's tenderness negative Rovsing's no costovertebral tenderness negative Murphy's Musculoskeletal: No lower extremity edema   Neurologic:  Normal speech and language. No gross focal neurologic deficits are appreciated. Skin:  Skin is warm, dry and intact. No rash noted. Psychiatric: Mood and affect are normal. Speech and behavior are normal.    ____________________________________________   DIFFERENTIAL  STEMI, non-STEMI,  unstable angina, gastric reflux ____________________________________________   LABS (all labs ordered are listed, but only abnormal results are displayed)  Labs Reviewed  BASIC METABOLIC PANEL - Abnormal; Notable for the following:       Result Value   Glucose, Bld 156 (*)    All other components within normal limits  CBC  TROPONIN I    First troponin negative __________________________________________  EKG  ED ECG REPORT I, Darel Hong, the attending physician, personally viewed and interpreted this ECG.  Date: 08/17/2016 Rate: 52 Rhythm: Sinus bradycardia QRS Axis: normal Intervals: normal ST/T Wave abnormalities: Flipped T-wave in aVL Conduction Disturbances: none Narrative Interpretation: Abnormal  Repeat EKG was slight ST depression in aVL abnormal EKG ____________________________________________  RADIOLOGY  Chest x-ray with no acute disease ____________________________________________   PROCEDURES  Procedure(s) performed: no  Procedures  Critical Care performed: no  ____________________________________________  INITIAL IMPRESSION / ASSESSMENT AND PLAN / ED COURSE  Pertinent labs & imaging results that were available during my care of the patient were reviewed by me and considered in my medical decision making (see chart for details).  On arrival the patient is somewhat uncomfortable appearing. His chest pain is not entirely typical but is pressure-like and relieved with nitroglycerin. First troponin pending. First EKG does show inverted T-wave in aVL which could be suggestive of inferior myocardial ischemia.  Fortunately the patient's first troponin is negative, however repeat EKG shows some ST depression in aVL. His heart score is at least a 4 and at this point I would not feel comfortable having him discharged home without a full rule out. I have discussed the case with the on-call hospitalist who is graciously agreed to admit the patient to his  service.      ____________________________________________   FINAL CLINICAL IMPRESSION(S) / ED DIAGNOSES  Final diagnoses:  Chest pain, unspecified type      NEW MEDICATIONS STARTED DURING THIS VISIT:  New Prescriptions   No medications on file     Note:  This document was prepared using Dragon voice recognition software and may include unintentional dictation errors.     Darel Hong, MD 08/17/16 [phone]

## 2016-08-17 NOTE — H&P (Addendum)
[place] [facility] [place] NAME: Joshua Everett    MR#:  [id number]  DATE OF BIRTH:  06-30-1934  DATE OF ADMISSION:  08/17/2016  PRIMARY CARE PHYSICIAN: Ria Bush, MD   REQUESTING/REFERRING PHYSICIAN:   CHIEF COMPLAINT:   Chief Complaint  Patient presents with  . Chest Pain    HISTORY OF PRESENT ILLNESS: Joshua Everett  is a 82 y.o. male with a known history of Benign prostate hypertrophy, GERD, glaucoma, hyperlipidemia, urinary incontinence presented to the emergency room with chest tightness. The chest tightness and pain started around 11 PM last night. Patient felt aching and pressure like sensation in the retrosternal area. It was 7 out of 10 on a scale of 1-10. Patient received oral aspirin and inch of Nitropaste from the emergency medical services. First set of troponin was negative. EKG within the emergency room showed no evidence of any ST segment elevation. Hospitalist service was consulted for further care of the patient. No history of any dizziness, blurry vision. Patient had cardiac stress test done more than 6 years ago in the past and does not follow with any cardiology.  PAST MEDICAL HISTORY:   Past Medical History:  Diagnosis Date  . BPH without obstruction/lower urinary tract symptoms 09/08/2014  . Chronic constipation 09/08/2014  . GERD (gastroesophageal reflux disease)   . Glaucoma   . History of chicken pox   . Hyperlipidemia   . Obesity, Class I, BMI 30-34.9 09/08/2014  . Prediabetes 07/31/2015  . Rosacea 07/06/2015  . Urine incontinence     PAST SURGICAL HISTORY: Past Surgical History:  Procedure Laterality Date  . COLONOSCOPY  03/2009   1 TA, diverticulosis, rpt 5 yrs (Dr [facility] in [facility])  . ESOPHAGOGASTRODUODENOSCOPY  03/2009   small HH, gastric polyps biopsied, dilation for dysphagia ([facility] [place])  . HEMORRHOID SURGERY  1976    SOCIAL HISTORY:  Social History  Substance Use Topics  .  Smoking status: Former Smoker    Quit date: 06/05/1968  . Smokeless tobacco: Never Used  . Alcohol use 0.0 oz/week     Comment: minimal    FAMILY HISTORY:  Family History  Problem Relation Age of Onset  . Diabetes Mother   . Alzheimer's disease Mother 36  . Kidney disease Father   . Cancer Father     brain tumor  . Cancer Sister     breast    DRUG ALLERGIES: No Known Allergies  REVIEW OF SYSTEMS:   CONSTITUTIONAL: No fever, fatigue or weakness.  EYES: No blurred or double vision.  EARS, NOSE, AND THROAT: No tinnitus or ear pain.  RESPIRATORY: No cough, shortness of breath, wheezing or hemoptysis.  CARDIOVASCULAR: Has chest pain,  No orthopnea, edema.  GASTROINTESTINAL: No nausea, vomiting, diarrhea or abdominal pain.  GENITOURINARY: No dysuria, hematuria.  ENDOCRINE: No polyuria, nocturia,  HEMATOLOGY: No anemia, easy bruising or bleeding SKIN: No rash or lesion. MUSCULOSKELETAL: No joint pain or arthritis.   NEUROLOGIC: No tingling, numbness, weakness.  PSYCHIATRY: No anxiety or depression.   MEDICATIONS AT HOME:  Prior to Admission medications   Medication Sig Start Date End Date Taking? Authorizing Provider  bimatoprost (LUMIGAN) 0.01 % SOLN Place 1 drop into both eyes at bedtime.    Yes Historical Provider, MD  cholecalciferol (VITAMIN D) 1000 units tablet Take 2,000 Units by mouth daily.   Yes Historical Provider, MD  cycloSPORINE (RESTASIS) 0.05 % ophthalmic emulsion Place 1 drop into both eyes 2 (  two) times daily.   Yes Historical Provider, MD  desmopressin (DDAVP) 0.2 MG tablet Take 0.6 mg by mouth at bedtime.    Yes Historical Provider, MD  fenofibrate 160 MG tablet Take 1 tablet (160 mg total) by mouth daily. 02/08/16  Yes Ria Bush, MD  linaclotide [facility]) 290 MCG CAPS capsule Take 1 capsule (290 mcg total) by mouth daily. 02/09/16  Yes Lucilla Lame, MD  mirabegron ER (MYRBETRIQ) 50 MG TB24 tablet Take 50 mg by mouth at bedtime.   Yes Historical Provider,  MD  omega-3 acid ethyl esters (LOVAZA) 1 g capsule Take 1 g by mouth daily.   Yes Historical Provider, MD  pantoprazole (PROTONIX) 40 MG tablet Take 1 tablet (40 mg total) by mouth daily. 10/25/15  Yes Darren Allen Norris, MD  simvastatin (ZOCOR) 40 MG tablet TAKE 1 TABLET DAILY AT 6 P.M. 06/07/16  Yes Ria Bush, MD  tamsulosin (FLOMAX) 0.4 MG CAPS capsule TAKE 1 CAPSULE DAILY 07/24/16  Yes Ria Bush, MD      PHYSICAL EXAMINATION:   VITAL SIGNS: Blood pressure (!) 149/71, pulse (!) 50, temperature 98.4 F (36.9 C), temperature source Oral, resp. rate 16, height 5' 11.5" (1.816 m), weight 104.3 kg (230 lb), SpO2 96 %.  GENERAL:  82 y.o.-year-old patient lying in the bed with no acute distress.  EYES: Pupils equal, round, reactive to light and accommodation. No scleral icterus. Extraocular muscles intact.  HEENT: Head atraumatic, normocephalic. Oropharynx and nasopharynx clear.  NECK:  Supple, no jugular venous distention. No thyroid enlargement, no tenderness.  LUNGS: Normal breath sounds bilaterally, no wheezing, rales,rhonchi or crepitation. No use of accessory muscles of respiration.  CARDIOVASCULAR: S1, S2 normal. No murmurs, rubs, or gallops.  ABDOMEN: Soft, nontender, nondistended. Bowel sounds present. No organomegaly or mass.  EXTREMITIES: No pedal edema, cyanosis, or clubbing.  NEUROLOGIC: Cranial nerves II through XII are intact. Muscle strength 5/5 in all extremities. Sensation intact. Gait not checked.  PSYCHIATRIC: The patient is alert and oriented x 3.  SKIN: No obvious rash, lesion, or ulcer.   LABORATORY PANEL:   CBC  Recent Labs Lab 08/17/16 0514  WBC 9.8  HGB 14.8  HCT 42.9  PLT 200  MCV 86.0  MCH 29.6  MCHC 34.4  RDW 13.0   ------------------------------------------------------------------------------------------------------------------  Chemistries   Recent Labs Lab 08/17/16 0514  NA 137  K 3.8  CL 104  CO2 28  GLUCOSE 156*  BUN 14   CREATININE 0.88  CALCIUM 9.8   ------------------------------------------------------------------------------------------------------------------ estimated creatinine clearance is 83 mL/min (by C-G formula based on SCr of 0.88 mg/dL). ------------------------------------------------------------------------------------------------------------------ No results for input(s): TSH, T4TOTAL, T3FREE, THYROIDAB in the last 72 hours.  Invalid input(s): FREET3   Coagulation profile No results for input(s): INR, PROTIME in the last 168 hours. ------------------------------------------------------------------------------------------------------------------- No results for input(s): DDIMER in the last 72 hours. -------------------------------------------------------------------------------------------------------------------  Cardiac Enzymes  Recent Labs Lab 08/17/16 0514  TROPONINI <0.03   ------------------------------------------------------------------------------------------------------------------ Invalid input(s): POCBNP  ---------------------------------------------------------------------------------------------------------------  Urinalysis No results found for: COLORURINE, APPEARANCEUR, LABSPEC, PHURINE, GLUCOSEU, HGBUR, BILIRUBINUR, KETONESUR, PROTEINUR, UROBILINOGEN, NITRITE, LEUKOCYTESUR   RADIOLOGY: Dg Chest 2 View  Result Date: 08/17/2016 CLINICAL DATA:  Chest tightness beginning around 04/1929. EXAM: CHEST  2 VIEW COMPARISON:  None. FINDINGS: Normal heart size and pulmonary vascularity. No focal airspace disease or consolidation in the lungs. No blunting of costophrenic angles. No pneumothorax. Mediastinal contours appear intact. Degenerative changes in the spine. IMPRESSION: No active cardiopulmonary disease. Electronically Signed   By: Oren Beckmann.D.  On: 08/17/2016 05:40    EKG: Orders placed or performed during the hospital encounter of 08/17/16  . EKG  12-Lead  . EKG 12-Lead  . ED EKG within 10 minutes  . ED EKG within 10 minutes  . ED EKG  . ED EKG  . EKG 12-Lead  . EKG 12-Lead  . EKG 12-Lead  . EKG 12-Lead    IMPRESSION AND PLAN: 82 year old male patient with history of benign prostate hypertrophy, hyperlipidemia, GERD, glaucoma presented to the emergency room with chest tightness. Admitting diagnosis 1. Chest pain 2. Hyperlipidemia 3. GERD 4. Prostate hypertrophy Treatment plan Admit patient to telemetry observation bed Cycle troponin to rule out ischemia Cardiology consultation and echocardiogram Cardiac stress test to assess for any ischemia and wall motion abnormality Start patient on oral aspirin DVT prophylaxis subcutaneous Lovenox 40 MG daily Resume statin medication Supportive care All the records are reviewed and case discussed with ED provider. Management plans discussed with the patient, family and they are in agreement.  CODE STATUS:FULL CODE Surrogate Decision Maker : wife Code Status History    This patient does not have a recorded code status. Please follow your organizational policy for patients in this situation.       TOTAL TIME TAKING CARE OF THIS PATIENT: 50 minutes.    Saundra Shelling M.D on 08/17/2016 at 6:59 AM  Between 7am to 6pm - Pager - [phone]  After 6pm go to [email or web address] - password [email or web address] [facility]  [facility]  Office  [phone]  CC: Primary care physician; Ria Bush, MD

## 2016-08-17 NOTE — Care Management Obs Status (Signed)
[facility] NOTIFICATION   Patient Details  Name: Joshua Everett MRN: [id number] Date of Birth: [birth year]   Medicare Observation Status Notification Given:  Yes    Katrina Stack, RN 08/17/2016, 4:38 PM

## 2016-08-17 NOTE — Discharge Instructions (Addendum)
Resume Diet and activity as before.  Take protonix twice daily for 1 week and then go back to once a day  Call Dr. Donivan Scull office to schedule follow up appointment [phone]).

## 2016-08-17 NOTE — ED Triage Notes (Signed)
Per EMS, pt reports chest tightness and reports around 11:30 "it felt like something was sitting on my chest." Pt denies SHOB or dizziness. Pt denies having hx of the same. EMS reports giving pt 324mg  of aspirin and an inch of nitro paste to chest. Pt reports chest pressure has gotten better since nitro adm. Pt A&O and in NAD at this time.

## 2016-08-17 NOTE — Consult Note (Signed)
Cardiology Consultation Note  Patient ID: Joshua Everett, MRN: [id number], DOB/AGE: [birth year] 82 y.o. Admit date: 08/17/2016   Date of Consult: 08/17/2016 Primary Physician: Ria Bush, MD Primary Cardiologist: New to [facility] - consult by [facility] Requesting Physician: Dr. Estanislado Pandy, MD  Chief Complaint: Chest pain Reason for Consult: Same  HPI: 82 y.o. male with h/o hyperglycemia, HLD, morbid obesity, GERD, BPH, and glaucoma who presented to [facility] with chest pain at rest.   He does not have any previously known cardiac history. He does report a prior stress test several years back that he states was normal. No prior cardiac caths. Secondly, he reports many years ago he was told he had a fast heart rate. He started taking garlic drops in orange juice at that time and reports no further issues. Never with cardiac work up.  Patient has been in his usual state of health lately, evening helping to care for his son's children in [place] recently while his son healed from a back surgery. While at his son's house he had to climb three flights of stairs multiple times daily and never had any issues. While laying in bed on 3/15 around 11 PM patient developed a chest pressure that he thought was reflux. He took some Alka-Seltzer without relief. He did go back to sleep. Around 3 AM he woke up with persistent chest pain/pressure. It was at that time he told his wife he was having chest pain. They contacted EMS who gave the patient 2 sprays of nitro and applied nitro paste. Chest pain resolved around 3:30 AM. No associated nausea, vomiting, diaphoresis, palpitations, dizziness, presyncope, or syncope.   Upon the patient's arrival to [facility] they were found to have BP 161/88, heart rate 52 bpm, temperature 98.4, oxygen saturation 98% on room air. ECG showed sinus bradycardia, 53 bpm, TWI aVL, CXR showed no acute process. Troponin negative x 1 at time of cardiology consult. CBC unremarkable. SCr 0.88, K+ 3.8. Currently  chest pain free. Feels like he is back to his baseline. IM has ordered nuclear stress test and echo.    Past Medical History:  Diagnosis Date  . BPH without obstruction/lower urinary tract symptoms 09/08/2014  . Chronic constipation 09/08/2014  . GERD (gastroesophageal reflux disease)   . Glaucoma   . History of chicken pox   . Hyperlipidemia   . Obesity, Class I, BMI 30-34.9 09/08/2014  . Prediabetes 07/31/2015  . Rosacea 07/06/2015  . Urine incontinence       Most Recent Cardiac Studies: TTE and stress test pending   Surgical History:  Past Surgical History:  Procedure Laterality Date  . COLONOSCOPY  03/2009   1 TA, diverticulosis, rpt 5 yrs (Dr Acquan[facility])  . ESOPHAGOGASTRODUODENOSCOPY  03/2009   small HH, gastric polyps biopsied, dilation for dysphagia ([facility])  . HEMORRHOID SURGERY  1976     Home Meds: Prior to Admission medications   Medication Sig Start Date End Date Taking? Authorizing Provider  bimatoprost (LUMIGAN) 0.01 % SOLN Place 1 drop into both eyes at bedtime.    Yes Historical Provider, MD  cholecalciferol (VITAMIN D) 1000 units tablet Take 2,000 Units by mouth daily.   Yes Historical Provider, MD  cycloSPORINE (RESTASIS) 0.05 % ophthalmic emulsion Place 1 drop into both eyes 2 (two) times daily.   Yes Historical Provider, MD  desmopressin (DDAVP) 0.2 MG tablet Take 0.6 mg by mouth at bedtime.    Yes Historical Provider, MD  fenofibrate 160 MG  tablet Take 1 tablet (160 mg total) by mouth daily. 02/08/16  Yes Ria Bush, MD  linaclotide [facility]) 290 MCG CAPS capsule Take 1 capsule (290 mcg total) by mouth daily. 02/09/16  Yes Lucilla Lame, MD  mirabegron ER (MYRBETRIQ) 50 MG TB24 tablet Take 50 mg by mouth at bedtime.   Yes Historical Provider, MD  omega-3 acid ethyl esters (LOVAZA) 1 g capsule Take 1 g by mouth daily.   Yes Historical Provider, MD  pantoprazole (PROTONIX) 40 MG tablet Take 1 tablet (40 mg total) by mouth daily. 10/25/15   Yes Lucilla Lame, MD  simvastatin (ZOCOR) 40 MG tablet TAKE 1 TABLET DAILY AT 6 P.M. 06/07/16  Yes Ria Bush, MD  tamsulosin (FLOMAX) 0.4 MG CAPS capsule TAKE 1 CAPSULE DAILY 07/24/16  Yes Ria Bush, MD    Inpatient Medications:  . [START ON 08/18/2016] aspirin EC  81 mg Oral Daily  . cholecalciferol  2,000 Units Oral Daily  . cycloSPORINE  1 drop Both Eyes BID  . desmopressin  0.6 mg Oral QHS  . enoxaparin (LOVENOX) injection  40 mg Subcutaneous Q24H  . fenofibrate  160 mg Oral Daily  . latanoprost  1 drop Both Eyes QHS  . mirabegron ER  50 mg Oral QHS  . omega-3 acid ethyl esters  1 g Oral Daily  . pantoprazole  40 mg Oral Daily  . simvastatin  20 mg Oral q1800  . sodium chloride flush  3 mL Intravenous Q12H  . tamsulosin  0.4 mg Oral Daily     Allergies: No Known Allergies  Social History   Social History  . Marital status: Married    Spouse name: N/A  . Number of children: N/A  . Years of education: N/A   Occupational History  . Not on file.   Social History Main Topics  . Smoking status: Former Smoker    Quit date: 06/05/1968  . Smokeless tobacco: Never Used  . Alcohol use 0.0 oz/week     Comment: minimal  . Drug use: No  . Sexual activity: No   Other Topics Concern  . Not on file   Social History Narrative   Lives with wife   Occ: Nature conservation officer, retired   Edu: M[employer]   Activity: golfs regularly   Diet: good water, fruits/vegetables daily     Family History  Problem Relation Age of Onset  . Diabetes Mother   . Alzheimer's disease Mother 57  . Kidney disease Father   . Cancer Father     brain tumor  . Cancer Sister     breast     Review of Systems: Review of Systems  Constitutional: Positive for malaise/fatigue. Negative for chills, diaphoresis, fever and weight loss.  HENT: Negative for congestion.   Eyes: Negative for discharge and redness.  Respiratory: Negative for cough, hemoptysis, sputum production, shortness of breath and  wheezing.   Cardiovascular: Positive for chest pain. Negative for palpitations, orthopnea, claudication, leg swelling and PND.  Gastrointestinal: Positive for heartburn. Negative for abdominal pain, blood in stool, melena, nausea and vomiting.  Genitourinary: Negative for hematuria.  Musculoskeletal: Negative for falls and myalgias.  Skin: Negative for rash.  Neurological: Negative for dizziness, tingling, tremors, sensory change, speech change, focal weakness, loss of consciousness and weakness.  Endo/Heme/Allergies: Does not bruise/bleed easily.  Psychiatric/Behavioral: Negative for substance abuse. The patient is not nervous/anxious.   All other systems reviewed and are negative.   Labs:  Recent Labs  08/17/16 0514  TROPONINI <0.03  Lab Results  Component Value Date   WBC 9.8 08/17/2016   HGB 14.8 08/17/2016   HCT 42.9 08/17/2016   MCV 86.0 08/17/2016   PLT 200 08/17/2016     Recent Labs Lab 08/17/16 0514  NA 137  K 3.8  CL 104  CO2 28  BUN 14  CREATININE 0.88  CALCIUM 9.8  GLUCOSE 156*   Lab Results  Component Value Date   CHOL 125 10/27/2015   HDL 31.60 (L) 10/27/2015   LDLCALC 70 10/27/2015   TRIG 117.0 10/27/2015   No results found for: DDIMER  Radiology/Studies:  Dg Chest 2 View  Result Date: 08/17/2016 CLINICAL DATA:  Chest tightness beginning around 04/1929. EXAM: CHEST  2 VIEW COMPARISON:  None. FINDINGS: Normal heart size and pulmonary vascularity. No focal airspace disease or consolidation in the lungs. No blunting of costophrenic angles. No pneumothorax. Mediastinal contours appear intact. Degenerative changes in the spine. IMPRESSION: No active cardiopulmonary disease. Electronically Signed   By: Lucienne Capers M.D.   On: 08/17/2016 05:40    EKG: Interpreted by me showed: sinus bradycardia, 53 bpm, TWI aVL Telemetry: Interpreted by me showed: sinus bradycardia, 50s bpm  Weights: Filed Weights   08/17/16 0504  Weight: 230 lb (104.3 kg)      Physical Exam: Blood pressure (!) 143/69, pulse (!) 50, temperature 97.9 F (36.6 C), temperature source Oral, resp. rate 16, height 5' 11.5" (1.816 m), weight 230 lb (104.3 kg), SpO2 95 %. Body mass index is 31.63 kg/m. General: Well developed, well nourished, in no acute distress. Head: Normocephalic, atraumatic, sclera non-icteric, no xanthomas, nares are without discharge.  Neck: Negative for carotid bruits. JVD not elevated. Lungs: Clear bilaterally to auscultation without wheezes, rales, or rhonchi. Breathing is unlabored. Heart: RRR with S1 S2. No murmurs, rubs, or gallops appreciated. Abdomen: Obese, soft, non-tender, non-distended with normoactive bowel sounds. No hepatomegaly. No rebound/guarding. No obvious abdominal masses. Msk:  Strength and tone appear normal for age. Extremities: No clubbing or cyanosis. No edema. Distal pedal pulses are 2+ and equal bilaterally. Neuro: Alert and oriented X 3. No facial asymmetry. No focal deficit. Moves all extremities spontaneously. Psych:  Responds to questions appropriately with a normal affect.    Assessment and Plan:  Active Problems:   Dyslipidemia   GERD (gastroesophageal reflux disease)   Obesity, Class I, BMI 30-34.9   Prediabetes   Chest pain with moderate risk for cardiac etiology    1. Chest pain with moderate risk of cardiac etiology: -Currently chest pain free -Initial troponin, continue to cycle -Internal medicine has ordered nuclear stress test and echo -Will run stress test if 2nd troponin is negative -Further recommendations pending stress test  2. Hyperglycemia: -Check A1c -Per IM  3. HLD: -Statin -Check lipid panel  4. Obesity: -Weight loss advised  5. Bradycardia: -Asymptomatic  -Demonstrates appropriate chronotropic response    Signed, Christell Faith, PA-C [facility] Pager: [phone] 08/17/2016, 11:03 AM

## 2016-08-17 NOTE — Telephone Encounter (Addendum)
Noted. Thanks. Pt has living will not DNR.

## 2016-08-18 ENCOUNTER — Telehealth: Payer: Self-pay | Admitting: Cardiovascular Disease

## 2016-08-18 ENCOUNTER — Telehealth: Payer: Self-pay | Admitting: *Deleted

## 2016-08-18 NOTE — Telephone Encounter (Signed)
Pt would like to wait until he sees his PCP, before he schedules a hospital f/u.

## 2016-08-18 NOTE — Discharge Summary (Signed)
Joshua Everett NAME: Joshua Everett    MR#:  [id number]  DATE OF BIRTH:  [birth year]  DATE OF ADMISSION:  08/17/2016 ADMITTING PHYSICIAN: Saundra Shelling, MD  DATE OF DISCHARGE: 08/17/2016  5:15 PM  PRIMARY CARE PHYSICIAN: Ria Bush, MD   ADMISSION DIAGNOSIS:  Chest pain [R07.9] Chest pain, unspecified type [R07.9]  DISCHARGE DIAGNOSIS:  Active Problems:   Dyslipidemia   GERD (gastroesophageal reflux disease)   Obesity, Class I, BMI 30-34.9   Prediabetes   Chest pain with moderate risk for cardiac etiology   SECONDARY DIAGNOSIS:   Past Medical History:  Diagnosis Date  . BPH without obstruction/lower urinary tract symptoms 09/08/2014  . Chronic constipation 09/08/2014  . GERD (gastroesophageal reflux disease)   . Glaucoma   . History of chicken pox   . Hyperlipidemia   . Obesity, Class I, BMI 30-34.9 09/08/2014  . Prediabetes 07/31/2015  . Rosacea 07/06/2015  . Urine incontinence      ADMITTING HISTORY  HISTORY OF PRESENT ILLNESS: Joshua Everett  is a 83 y.o. male with a known history of Benign prostate hypertrophy, GERD, glaucoma, hyperlipidemia, urinary incontinence presented to the emergency room with chest tightness. The chest tightness and pain started around 11 PM last night. Patient felt aching and pressure like sensation in the retrosternal area. It was 7 out of 10 on a scale of 1-10. Patient received oral aspirin and inch of Nitropaste from the emergency medical services. First set of troponin was negative. EKG within the emergency room showed no evidence of any ST segment elevation. Hospitalist service was consulted for further care of the patient. No history of any dizziness, blurry vision. Patient had cardiac stress test done more than 6 years ago in the past and does not follow with any cardiology.  HOSPITAL COURSE:   * Chest pain, atypical This was suspected to be possibly ACS or GERD. Patient was monitored over  telemetry with no arrhythmias. Troponin was normal. He had a Myoview stress test done which showed normal ejection fraction. No reversible ischemia. Seen by cardiology. He is being instructed to take PPIs at home and is being discharged home.  CONSULTS OBTAINED:  Treatment Team:  Minna Merritts, MD  DRUG ALLERGIES:  No Known Allergies  DISCHARGE MEDICATIONS:   Discharge Medication List as of 08/17/2016  5:03 PM    START taking these medications   Details  aspirin EC 81 MG tablet Take 1 tablet (81 mg total) by mouth daily., Starting Thu 08/17/2016, No Print      CONTINUE these medications which have NOT CHANGED   Details  bimatoprost (LUMIGAN) 0.01 % SOLN Place 1 drop into both eyes at bedtime. , Historical Med    cholecalciferol (VITAMIN D) 1000 units tablet Take 2,000 Units by mouth daily., Historical Med    cycloSPORINE (RESTASIS) 0.05 % ophthalmic emulsion Place 1 drop into both eyes 2 (two) times daily., Historical Med    desmopressin (DDAVP) 0.2 MG tablet Take 0.6 mg by mouth at bedtime. , Historical Med    fenofibrate 160 MG tablet Take 1 tablet (160 mg total) by mouth daily., Starting Tue 02/08/2016, Normal    linaclotide (LINZESS) 290 MCG CAPS capsule Take 1 capsule (290 mcg total) by mouth daily., Starting Wed 02/09/2016, Normal    mirabegron ER (MYRBETRIQ) 50 MG TB24 tablet Take 50 mg by mouth at bedtime., Historical Med    omega-3 acid ethyl esters (LOVAZA) 1 g capsule Take 1 g by  mouth daily., Historical Med    pantoprazole (PROTONIX) 40 MG tablet Take 1 tablet (40 mg total) by mouth daily., Starting Mon 10/25/2015, Normal    simvastatin (ZOCOR) 40 MG tablet TAKE 1 TABLET DAILY AT 6 P.M., Normal    tamsulosin (FLOMAX) 0.4 MG CAPS capsule TAKE 1 CAPSULE DAILY, Normal        Today   VITAL SIGNS:  Blood pressure (!) 154/60, pulse (!) 59, temperature 97.9 F (36.6 C), temperature source Oral, resp. rate 14, height 5' 11.5" (1.816 m), weight 104.3 kg (230 lb), SpO2  97 %.  I/O:  No intake or output data in the 24 hours ending 08/18/16 1511  PHYSICAL EXAMINATION:  Physical Exam  GENERAL:  82 y.o.-year-old patient lying in the bed with no acute distress.  LUNGS: Normal breath sounds bilaterally, no wheezing, rales,rhonchi or crepitation. No use of accessory muscles of respiration.  CARDIOVASCULAR: S1, S2 normal. No murmurs, rubs, or gallops.  ABDOMEN: Soft, non-tender, non-distended. Bowel sounds present. No organomegaly or mass.  NEUROLOGIC: Moves all 4 extremities. PSYCHIATRIC: The patient is alert and oriented x 3.  SKIN: No obvious rash, lesion, or ulcer.   DATA REVIEW:   CBC  Recent Labs Lab 08/17/16 0514  WBC 9.8  HGB 14.8  HCT 42.9  PLT 200    Chemistries   Recent Labs Lab 08/17/16 0514  NA 137  K 3.8  CL 104  CO2 28  GLUCOSE 156*  BUN 14  CREATININE 0.88  CALCIUM 9.8    Cardiac Enzymes  Recent Labs Lab 08/17/16 1012  [place] <0.03    Microbiology Results  Results for orders placed or performed during the hospital encounter of 08/17/16  MRSA PCR Screening     Status: None   Collection Time: 08/17/16  9:00 AM  Result Value Ref Range Status   MRSA by PCR NEGATIVE NEGATIVE Final    Comment:        The GeneXpert MRSA Assay (FDA approved for NASAL specimens only), is one component of a comprehensive MRSA colonization surveillance program. It is not intended to diagnose MRSA infection nor to guide or monitor treatment for MRSA infections.     RADIOLOGY:  Dg Chest 2 View  Result Date: 08/17/2016 CLINICAL DATA:  Chest tightness beginning around 04/1929. EXAM: CHEST  2 VIEW COMPARISON:  None. FINDINGS: Normal heart size and pulmonary vascularity. No focal airspace disease or consolidation in the lungs. No blunting of costophrenic angles. No pneumothorax. Mediastinal contours appear intact. Degenerative changes in the spine. IMPRESSION: No active cardiopulmonary disease. Electronically Signed   By: Lucienne Capers M.D.   On: 08/17/2016 05:40   Nm Myocar Multi W/spect W/wall Motion / Ef  Result Date: 08/17/2016 Pharmacological myocardial perfusion imaging study with no significant  ischemia Normal wall motion, EF estimated at 70% No EKG changes concerning for ischemia at peak stress or in recovery. Low risk scan Signed, Esmond Plants, MD, Ph.D [facility]    Follow up with PCP in 1 week.  Management plans discussed with the patient, family and they are in agreement.  CODE STATUS:  Code Status History    Date Active Date Inactive Code Status Order ID Comments User Context   08/17/2016  8:17 AM 08/17/2016  8:15 PM Full Code [id number]  Saundra Shelling, MD ED    Advance Directive Documentation     Most Recent Value  Type of Advance Directive  Living will, Healthcare Power of Attorney  Pre-existing out of facility DNR order (yellow  form or pink MOST form)  -  "MOST" Form in Place?  -      TOTAL TIME TAKING CARE OF THIS PATIENT ON DAY OF DISCHARGE: more than 30 minutes.   Hillary Bow R M.D on 08/18/2016 at 3:11 PM  Between 7am to 6pm - Pager - [phone]  After 6pm go to [email or web address] - password [email or web address] [place]  Office  [phone]  CC: Primary care physician; Ria Bush, MD  Note: This dictation was prepared with Dragon dictation along with smaller phrase technology. Any transcriptional errors that result from this process are unintentional.

## 2016-08-18 NOTE — Telephone Encounter (Signed)
PATIENT NAME: Joshua Everett    MR#:  [id number]  DATE OF BIRTH:  16-Oct-1934  DATE OF ADMISSION:  08/17/2016    ADMITTING PHYSICIAN: Saundra Shelling, MD  DATE OF DISCHARGE: 08/17/2016  5:15 PM  PRIMARY CARE PHYSICIAN: Ria Bush, MD   ADMISSION DIAGNOSIS:  Chest pain [R07.9] Chest pain, unspecified type [R07.9]  DISCHARGE DIAGNOSIS:  Active Problems:   Dyslipidemia   GERD (gastroesophageal reflux disease)   Obesity, Class I, BMI 30-34.9   Prediabetes   Chest pain with moderate risk for cardiac etiology   Transition Care Management Follow-up Telephone Call (Done with wife)    How have you been since you were released from the hospital? Doing great!   Do you understand why you were in the hospital? yes   Do you understand the discharge instructions? yes   Where were you discharged to? Home with wife.   Items Reviewed:  Medications reviewed: yes  Allergies reviewed: yes  Dietary changes reviewed: yes  Referrals reviewed: yes   Functional Questionnaire:   Activities of Daily Living (ADLs):   He states they are independent in the following: ambulation, bathing and hygiene, feeding, continence, grooming, toileting and dressing States they require assistance with the following: n/a   Any transportation issues/concerns?: no   Any patient concerns? no   Confirmed importance and date/time of follow-up visits scheduled yes  Provider Appointment booked with Dr.Gutierrez 08/29/16 @4 .  Confirmed with patient if condition begins to worsen call PCP or go to the ER.  Patient was given the office number and encouraged to call back with question or concerns.  : yes

## 2016-08-29 ENCOUNTER — Encounter: Payer: Self-pay | Admitting: Family Medicine

## 2016-08-29 ENCOUNTER — Ambulatory Visit (INDEPENDENT_AMBULATORY_CARE_PROVIDER_SITE_OTHER): Payer: Medicare Other | Admitting: Family Medicine

## 2016-08-29 VITALS — BP 126/68 | HR 55 | Temp 97.4°F | Wt 238.5 lb

## 2016-08-29 DIAGNOSIS — R079 Chest pain, unspecified: Secondary | ICD-10-CM

## 2016-08-29 DIAGNOSIS — E785 Hyperlipidemia, unspecified: Secondary | ICD-10-CM

## 2016-08-29 DIAGNOSIS — K219 Gastro-esophageal reflux disease without esophagitis: Secondary | ICD-10-CM

## 2016-08-29 NOTE — Assessment & Plan Note (Signed)
Lab Results  Component Value Date   LDLCALC 70 10/27/2015  continue zocor, fibrate, lovaza.

## 2016-08-29 NOTE — Patient Instructions (Addendum)
Chest pain was lightly GERD related.  Take aspirin once weekly Take zantac 150mg  nightly in addition to protonix.  Keep an eye out for bleeding, or for recurrent chest pain (then would recommend return to cardiology). Let us know if any new falls or episodes of confusion.  See you in July!

## 2016-08-29 NOTE — Progress Notes (Signed)
BP 126/68   Pulse (!) 55   Temp 97.4 F (36.3 C) (Oral)   Wt 238 lb 8 oz (108.2 kg)   SpO2 96%   BMI 32.80 kg/m    CC: hosp f/u visit Subjective:    Patient ID: Joshua Everett, male    DOB: 29-Jul-1934, 82 y.o.   MRN: [id number]  HPI: Joshua Everett is a 82 y.o. male presenting on 08/29/2016 for Follow-up [facility]; discuss ASA)   Here with wife.   Recent hospitalization for chest pain. Records reviewed. EKG, cardiac enzymes were ok. Myoview stress test was normal without signs of reversible ischemia and EF 70%. Nitro may have helped, GI cocktail did help. Discharged on PPI BID x 1 wk. Recommended aspirin.   Pt describes chest tightness that started that evening at 11pm, waited until 3am to be evaluated. He has not had any more chest pain episodes since he's been out of the hospital.   Aspirin previously has led to easy bleeding of cherry angiomas especially around groin. Pt very hesitant to start this.   Fall at beach last month with episode of disorientation that quickly resolved. This was while walking on sand. No further problems.   DATE OF ADMISSION: 08/17/2016 DATE OF DISCHARGE: 08/17/2016  Hosp f/u phone call performed 08/18/2016  Active Problems:   Dyslipidemia   GERD (gastroesophageal reflux disease)   Obesity, Class I, BMI 30-34.9   Prediabetes   Chest pain with moderate risk for cardiac etiology  Relevant past medical, surgical, family and social history reviewed and updated as indicated. Interim medical history since our last visit reviewed. Allergies and medications reviewed and updated. Outpatient Medications Prior to Visit  Medication Sig Dispense Refill  . bimatoprost (LUMIGAN) 0.01 % SOLN Place 1 drop into both eyes at bedtime.     . cholecalciferol (VITAMIN D) 1000 units tablet Take 1,000 Units by mouth daily.     . cycloSPORINE (RESTASIS) 0.05 % ophthalmic emulsion Place 1 drop into both eyes 2 (two) times daily.    [name] Kitchen desmopressin (DDAVP) 0.2 MG  tablet Take 0.6 mg by mouth at bedtime.     . fenofibrate 160 MG tablet Take 1 tablet (160 mg total) by mouth daily. 90 tablet 3  . linaclotide (LINZESS) 290 MCG CAPS capsule Take 1 capsule (290 mcg total) by mouth daily. 90 capsule 3  . mirabegron ER (MYRBETRIQ) 50 MG TB24 tablet Take 50 mg by mouth at bedtime.    [name] Kitchen omega-3 acid ethyl esters (LOVAZA) 1 g capsule Take 1 g by mouth daily.    . pantoprazole (PROTONIX) 40 MG tablet Take 1 tablet (40 mg total) by mouth daily. 90 tablet 3  . simvastatin (ZOCOR) 40 MG tablet TAKE 1 TABLET DAILY AT 6 P.M. 90 tablet 1  . tamsulosin (FLOMAX) 0.4 MG CAPS capsule TAKE 1 CAPSULE DAILY 90 capsule 2  . aspirin EC 81 MG tablet Take 1 tablet (81 mg total) by mouth daily. (Patient not taking: Reported on 08/29/2016)     No facility-administered medications prior to visit.      Per HPI unless specifically indicated in ROS section below Review of Systems     Objective:    BP 126/68   Pulse (!) 55   Temp 97.4 F (36.3 C) (Oral)   Wt 238 lb 8 oz (108.2 kg)   SpO2 96%   BMI 32.80 kg/m   Wt Readings from Last 3 Encounters:  08/29/16 238 lb 8 oz (108.2 kg)  08/17/16  230 lb (104.3 kg)  12/20/15 230 lb (104.3 kg)    Physical Exam  Constitutional: He appears well-developed and well-nourished. No distress.  HENT:  Mouth/Throat: Oropharynx is clear and moist. No oropharyngeal exudate.  Eyes: Conjunctivae are normal. Pupils are equal, round, and reactive to light.  Neck: Normal range of motion. Neck supple.  Cardiovascular: Normal rate, regular rhythm, normal heart sounds and intact distal pulses.   No murmur heard. Pulmonary/Chest: Effort normal and breath sounds normal. No respiratory distress. He has no wheezes. He has no rales.  Abdominal: Soft. Bowel sounds are normal. He exhibits no distension and no mass. There is no tenderness. There is no rebound and no guarding.  Musculoskeletal: He exhibits no edema.  Lymphadenopathy:    He has no cervical  adenopathy.  Skin: Skin is warm and dry.  Multiple cherry angiomas on trunk  Nursing note and vitals reviewed.    EKG from hospitalization independently reviewed by me - sinus bradycardia 50s, normal axis and intervals, T wave flattening laterally.   Myoview stress test Study Result  Pharmacological myocardial perfusion imaging study with no significant  ischemia Normal wall motion, EF estimated at 70% No EKG changes concerning for ischemia at peak stress or in recovery. Low risk scan  Signed, Esmond Plants, MD, Ph.D [facility]      Assessment & Plan:   Problem List Items Addressed This Visit    Chest pain with moderate risk for cardiac etiology - Primary    Reviewed recent hospitalization with concerning chest pain but it did improve with GI cocktail and he had low risk pharmacologic stress test while in hospital. Reviewed overall reassuring hospital stay with pt and wife.  Discussed aspirin use, rec trial one pill weekly to avoid bleed from scrotal angiomas. Pt agrees to try.  Chest pain thought GI related - see above.      Dyslipidemia    Lab Results  Component Value Date   LDLCALC 70 10/27/2015  continue zocor, fibrate, lovaza.       GERD (gastroesophageal reflux disease)    Chest pain thought GI related - completed 1 week PPI BID therapy, now back to protonix 40mg  daily, I suggested he add antihistamine at night as well (zantac/pepcid). He and wife are aware of dietary choices to maintain good GERD control.        Relevant Medications   ranitidine (ZANTAC) 150 MG capsule       Follow up plan: No Follow-up on file.  Ria Bush, MD

## 2016-08-29 NOTE — Progress Notes (Signed)
Pre visit review using our clinic review tool, if applicable. No additional management support is needed unless otherwise documented below in the visit note. 

## 2016-08-29 NOTE — Assessment & Plan Note (Addendum)
Reviewed recent hospitalization with concerning chest pain but it did improve with GI cocktail and he had low risk pharmacologic stress test while in hospital. Reviewed overall reassuring hospital stay with pt and wife.  Discussed aspirin use, rec trial one pill weekly to avoid bleed from scrotal angiomas. Pt agrees to try.  Chest pain thought GI related - see above.

## 2016-08-29 NOTE — Assessment & Plan Note (Addendum)
Chest pain thought GI related - completed 1 week PPI BID therapy, now back to protonix 40mg  daily, I suggested he add antihistamine at night as well (zantac/pepcid). He and wife are aware of dietary choices to maintain good GERD control.

## 2016-09-19 DIAGNOSIS — H401131 Primary open-angle glaucoma, bilateral, mild stage: Secondary | ICD-10-CM | POA: Diagnosis not present

## 2016-09-26 DIAGNOSIS — H401131 Primary open-angle glaucoma, bilateral, mild stage: Secondary | ICD-10-CM | POA: Diagnosis not present

## 2016-10-21 ENCOUNTER — Other Ambulatory Visit: Payer: Self-pay | Admitting: Gastroenterology

## 2016-10-21 DIAGNOSIS — K219 Gastro-esophageal reflux disease without esophagitis: Secondary | ICD-10-CM

## 2016-11-17 ENCOUNTER — Other Ambulatory Visit: Payer: Self-pay | Admitting: Family Medicine

## 2016-11-23 ENCOUNTER — Other Ambulatory Visit: Payer: Self-pay | Admitting: Family Medicine

## 2016-12-08 NOTE — Progress Notes (Signed)
Subjective:   Joshua Everett is a 82 y.o. male who presents for Medicare Annual/Subsequent preventive examination.  Review of Systems:  No ROS.  Medicare Wellness Visit. Additional risk factors are reflected in the social history.  Cardiac Risk Factors include: advanced age (>87men, >52 women);dyslipidemia;hypertension;male gender;obesity (BMI >30kg/m2)     Objective:    Vitals: BP 138/78 (BP Location: Right Arm, Patient Position: Sitting, Cuff Size: Normal)   Pulse (!) 58   Resp 15   Ht 5' 10.5" (1.791 m)   Wt 235 lb 8 oz (106.8 kg)   SpO2 98%   BMI 33.31 kg/m   Body mass index is 33.31 kg/m.  Tobacco History  Smoking Status  . Former Smoker  . Quit date: 06/05/1968  Smokeless Tobacco  . Never Used     Counseling given: Not Answered   Past Medical History:  Diagnosis Date  . BPH without obstruction/lower urinary tract symptoms 09/08/2014  . Chronic constipation 09/08/2014  . GERD (gastroesophageal reflux disease)   . Glaucoma   . History of chicken pox   . Hyperlipidemia   . Obesity, Class I, BMI 30-34.9 09/08/2014  . Prediabetes 07/31/2015  . Rosacea 07/06/2015  . Urine incontinence    Past Surgical History:  Procedure Laterality Date  . COLONOSCOPY  03/2009   1 TA, diverticulosis, rpt 5 yrs (Dr [facility] in [facility])  . ESOPHAGOGASTRODUODENOSCOPY  03/2009   small HH, gastric polyps biopsied, dilation for dysphagia ([facility])  . HEMORRHOID SURGERY  1976   Family History  Problem Relation Age of Onset  . Diabetes Mother   . Alzheimer's disease Mother 29  . Kidney disease Father   . Cancer Father        brain tumor  . Cancer Sister        breast   History  Sexual Activity  . Sexual activity: No    Outpatient Encounter Prescriptions as of 12/20/2016  Medication Sig  . aspirin EC 81 MG tablet Take 1 tablet (81 mg total) by mouth once a week.  . B Complex Vitamins (VITAMIN B COMPLEX PO) Take 1 tablet by mouth daily.  . bimatoprost  (LUMIGAN) 0.01 % SOLN Place 1 drop into both eyes at bedtime.   . cholecalciferol (VITAMIN D) 1000 units tablet Take 1,000 Units by mouth daily.   . cycloSPORINE (RESTASIS) 0.05 % ophthalmic emulsion Place 1 drop into both eyes 2 (two) times daily.  [name] Kitchen desmopressin (DDAVP) 0.2 MG tablet Take 0.6 mg by mouth at bedtime.   . fenofibrate 160 MG tablet TAKE 1 TABLET DAILY  . linaclotide (LINZESS) 290 MCG CAPS capsule Take 1 capsule (290 mcg total) by mouth daily.  . mirabegron ER (MYRBETRIQ) 50 MG TB24 tablet Take 50 mg by mouth at bedtime.  . Multiple Vitamins-Minerals (MULTIVITAMIN ADULT PO) Take 1 tablet by mouth daily.  [name] Kitchen omega-3 acid ethyl esters (LOVAZA) 1 g capsule Take 1 g by mouth daily.  . pantoprazole (PROTONIX) 40 MG tablet TAKE 1 TABLET DAILY  . ranitidine (ZANTAC) 150 MG capsule Take 150 mg by mouth daily as needed.   . simvastatin (ZOCOR) 40 MG tablet TAKE 1 TABLET DAILY AT 6 P.M.  . tamsulosin (FLOMAX) 0.4 MG CAPS capsule TAKE 1 CAPSULE DAILY   No facility-administered encounter medications on file as of 12/20/2016.     Activities of Daily Living In your present state of health, do you have any difficulty performing the following activities: 12/20/2016 08/17/2016  Hearing? N  N  Vision? N N  Difficulty concentrating or making decisions? N N  Walking or climbing stairs? N N  Dressing or bathing? N N  Doing errands, shopping? N N  Preparing Food and eating ? N -  Using the Toilet? N -  In the past six months, have you accidently leaked urine? Y -  Do you have problems with loss of bowel control? N -  Managing your Medications? N -  Managing your Finances? N -  Housekeeping or managing your Housekeeping? N -  Some recent data might be hidden    Patient Care Team: Ria Bush, MD as PCP - General (Family Medicine) Leandrew Koyanagi, MD as Referring Physician (Ophthalmology) Lucilla Lame, MD as Consulting Physician (Gastroenterology) Franchot Gallo, MD as  Consulting Physician (Urology) Ralene Bathe, MD as Consulting Physician (Dermatology)   Assessment:    Physical assessment deferred to PCP.  Exercise Activities and Dietary recommendations Current Exercise Habits: Home exercise routine, Type of exercise: Other - see comments (Stationary bike), Time (Minutes): 15, Frequency (Times/Week): 3, Weekly Exercise (Minutes/Week): 45  Goals    . Increase physical activity (pt-stated)          Starting 12/20/2016, I will continue to exercise for at least 15-20 min 3 times weekly.       Fall Risk Fall Risk  12/20/2016 12/20/2015 11/11/2015 05/10/2015 11/06/2014  Falls in the past year? No No No No No   Depression Screen PHQ 2/9 Scores 12/20/2016 12/20/2015 11/11/2015 05/10/2015  PHQ - 2 Score 0 0 0 0    Cognitive Function PLEASE NOTE: A Mini-Cog screen was completed. Maximum score is 20. A value of 0 denotes this part of Folstein MMSE was not completed or the patient failed this part of the Mini-Cog screening.   Mini-Cog Screening Orientation to Time - Max 5 pts Orientation to Place - Max 5 pts Registration - Max 3 pts Recall - Max 3 pts Language Repeat - Max 1 pts Language Follow 3 Step Command - Max 3 pts      Mini-Cog - 12/20/16 0858    Normal clock drawing test? yes   How many words correct? 2      MMSE - Mini Mental State Exam 12/20/2016 11/11/2015  Orientation to time 5 5  Orientation to Place 5 5  Registration 3 3  Attention/ Calculation 0 0  Recall 2 3  Language- name 2 objects 0 0  Language- repeat 1 1  Language- follow 3 step command 3 3  Language- read & follow direction 0 0  Write a sentence 0 0  Copy design 0 0  Total score 19 20        Immunization History  Administered Date(s) Administered  . Influenza-Unspecified 03/23/2014  . Pneumococcal Conjugate-13 03/23/2014  . Pneumococcal Polysaccharide-23 06/06/2007  . Tdap 07/21/2013  . Zoster 05/04/2013   Screening Tests Health Maintenance  Topic Date Due  .  INFLUENZA VACCINE  01/03/2017  . DTaP/Tdap/Td (2 - Td) 07/22/2023  . TETANUS/TDAP  07/22/2023  . PNA vac Low Risk Adult  Completed      Plan:    Follow-up w/ PCP as scheduled.  I have personally reviewed and noted the following in the patient's chart:   . Medical and social history . Use of alcohol, tobacco or illicit drugs  . Current medications and supplements . Functional ability and status . Nutritional status . Physical activity . Advanced directives . List of other physicians . Vitals . Screenings to include  cognitive, depression, and falls . Referrals and appointments  In addition, I have reviewed and discussed with patient certain preventive protocols, quality metrics, and best practice recommendations. A written personalized care plan for preventive services as well as general preventive health recommendations were provided to patient.     Dorrene German, RN  12/20/2016

## 2016-12-08 NOTE — Progress Notes (Signed)
PCP notes:   Health maintenance: No gaps identified.   Abnormal screenings:  Hearing-failed.   Patient concerns: None.   Nurse concerns: None   Next PCP appt: 12/25/2016 @ 2:00pm

## 2016-12-19 ENCOUNTER — Other Ambulatory Visit: Payer: Self-pay | Admitting: Family Medicine

## 2016-12-19 DIAGNOSIS — E785 Hyperlipidemia, unspecified: Secondary | ICD-10-CM

## 2016-12-19 DIAGNOSIS — R7303 Prediabetes: Secondary | ICD-10-CM

## 2016-12-20 ENCOUNTER — Other Ambulatory Visit (INDEPENDENT_AMBULATORY_CARE_PROVIDER_SITE_OTHER): Payer: Medicare Other

## 2016-12-20 ENCOUNTER — Ambulatory Visit (INDEPENDENT_AMBULATORY_CARE_PROVIDER_SITE_OTHER): Payer: Medicare Other

## 2016-12-20 ENCOUNTER — Other Ambulatory Visit: Payer: Medicare Other

## 2016-12-20 VITALS — BP 138/78 | HR 58 | Resp 15 | Ht 70.5 in | Wt 235.5 lb

## 2016-12-20 DIAGNOSIS — R7303 Prediabetes: Secondary | ICD-10-CM | POA: Diagnosis not present

## 2016-12-20 DIAGNOSIS — E785 Hyperlipidemia, unspecified: Secondary | ICD-10-CM

## 2016-12-20 DIAGNOSIS — Z0000 Encounter for general adult medical examination without abnormal findings: Secondary | ICD-10-CM

## 2016-12-20 LAB — COMPREHENSIVE METABOLIC PANEL
ALT: 13 U/L (ref 0–53)
AST: 16 U/L (ref 0–37)
Albumin: 3.9 g/dL (ref 3.5–5.2)
Alkaline Phosphatase: 34 U/L — ABNORMAL LOW (ref 39–117)
BILIRUBIN TOTAL: 0.6 mg/dL (ref 0.2–1.2)
BUN: 13 mg/dL (ref 6–23)
CALCIUM: 9.8 mg/dL (ref 8.4–10.5)
CHLORIDE: 104 meq/L (ref 96–112)
CO2: 31 mEq/L (ref 19–32)
CREATININE: 1.12 mg/dL (ref 0.40–1.50)
GFR: 66.99 mL/min (ref >60.00)
Glucose, Bld: 116 mg/dL — ABNORMAL HIGH (ref 70–99)
Potassium: 4 mEq/L (ref 3.5–5.1)
Sodium: 139 mEq/L (ref 135–145)
Total Protein: 6.2 g/dL (ref 6.0–8.3)

## 2016-12-20 LAB — LIPID PANEL
CHOL/HDL RATIO: 3
Cholesterol: 121 mg/dL (ref 0–200)
HDL: 39.1 mg/dL (ref >39.00)
LDL Cholesterol: 62 mg/dL (ref 0–99)
NONHDL: 81.79
Triglycerides: 100 mg/dL (ref 0.0–149.0)
VLDL: 20 mg/dL (ref 0.0–40.0)

## 2016-12-20 LAB — TSH: TSH: 2.56 u[IU]/mL (ref 0.35–4.50)

## 2016-12-20 LAB — HEMOGLOBIN A1C: HEMOGLOBIN A1C: 5.7 % (ref 4.6–6.5)

## 2016-12-20 NOTE — Patient Instructions (Addendum)
Joshua Everett , Thank you for taking time to come for your Medicare Wellness Visit. I appreciate your ongoing commitment to your health goals. Please review the following plan we discussed and let me know if I can assist you in the future.   These are the goals we discussed: Goals    . Increase physical activity (pt-stated)          Starting 12/20/2016, I will continue to exercise for at least 15-20 min 3 times weekly.        This is a list of the screening recommended for you and due dates:  Health Maintenance  Topic Date Due  . Flu Shot  01/03/2017  . DTaP/Tdap/Td vaccine (2 - Td) 07/22/2023  . Tetanus Vaccine  07/22/2023  . Pneumonia vaccines  Completed   Preventive Care for Adults  A healthy lifestyle and preventive care can promote health and wellness. Preventive health guidelines for adults include the following key practices.  . A routine yearly physical is a good way to check with your health care provider about your health and preventive screening. It is a chance to share any concerns and updates on your health and to receive a thorough exam.  . Visit your dentist for a routine exam and preventive care every 6 months. Brush your teeth twice a day and floss once a day. Good oral hygiene prevents tooth decay and gum disease.  . The frequency of eye exams is based on your age, health, family medical history, use  of contact lenses, and other factors. Follow your health care provider's ecommendations for frequency of eye exams.  . Eat a healthy diet. Foods like vegetables, fruits, whole grains, low-fat dairy products, and lean protein foods contain the nutrients you need without too many calories. Decrease your intake of foods high in solid fats, added sugars, and salt. Eat the right amount of calories for you. Get information about a proper diet from your health care provider, if necessary.  . Regular physical exercise is one of the most important things you can do for your  health. Most adults should get at least 150 minutes of moderate-intensity exercise (any activity that increases your heart rate and causes you to sweat) each week. In addition, most adults need muscle-strengthening exercises on 2 or more days a week.  Silver Sneakers may be a benefit available to you. To determine eligibility, you may visit the website: www.silversneakers.com or contact program at (813) 192-5777 Mon-Fri between 8AM-8PM.   . Maintain a healthy weight. The body mass index (BMI) is a screening tool to identify possible weight problems. It provides an estimate of body fat based on height and weight. Your health care provider can find your BMI and can help you achieve or maintain a healthy weight.   For adults 20 years and older: ? A BMI below 18.5 is considered underweight. ? A BMI of 18.5 to 24.9 is normal. ? A BMI of 25 to 29.9 is considered overweight. ? A BMI of 30 and above is considered obese.   . Maintain normal blood lipids and cholesterol levels by exercising and minimizing your intake of saturated fat. Eat a balanced diet with plenty of fruit and vegetables. Blood tests for lipids and cholesterol should begin at age 35 and be repeated every 5 years. If your lipid or cholesterol levels are high, you are over 50, or you are at high risk for heart disease, you may need your cholesterol levels checked more frequently. Ongoing high lipid and  cholesterol levels should be treated with medicines if diet and exercise are not working.  . If you smoke, find out from your health care provider how to quit. If you do not use tobacco, please do not start.  . If you choose to drink alcohol, please do not consume more than 2 drinks per day. One drink is considered to be 12 ounces (355 mL) of beer, 5 ounces (148 mL) of wine, or 1.5 ounces (44 mL) of liquor.  . If you are 16-73 years old, ask your health care provider if you should take aspirin to prevent strokes.  . Use sunscreen. Apply  sunscreen liberally and repeatedly throughout the day. You should seek shade when your shadow is shorter than you. Protect yourself by wearing long sleeves, pants, a wide-brimmed hat, and sunglasses year round, whenever you are outdoors.  . Once a month, do a whole body skin exam, using a mirror to look at the skin on your back. Tell your health care provider of new moles, moles that have irregular borders, moles that are larger than a pencil eraser, or moles that have changed in shape or color.

## 2016-12-20 NOTE — Progress Notes (Signed)
I reviewed health advisor's note, was available for consultation, and agree with documentation and plan.  

## 2016-12-25 ENCOUNTER — Ambulatory Visit: Payer: Medicare Other | Admitting: Family Medicine

## 2017-01-01 ENCOUNTER — Ambulatory Visit (INDEPENDENT_AMBULATORY_CARE_PROVIDER_SITE_OTHER): Payer: Medicare Other | Admitting: Family Medicine

## 2017-01-01 ENCOUNTER — Encounter: Payer: Self-pay | Admitting: Family Medicine

## 2017-01-01 ENCOUNTER — Ambulatory Visit (INDEPENDENT_AMBULATORY_CARE_PROVIDER_SITE_OTHER)
Admission: RE | Admit: 2017-01-01 | Discharge: 2017-01-01 | Disposition: A | Discharge status: Home / Self Care | Payer: Medicare Other | Source: Ambulatory Visit | Attending: Family Medicine | Admitting: Family Medicine

## 2017-01-01 VITALS — BP 140/70 | HR 59 | Temp 97.8°F | Ht 71.0 in | Wt 234.5 lb

## 2017-01-01 DIAGNOSIS — G8929 Other chronic pain: Secondary | ICD-10-CM

## 2017-01-01 DIAGNOSIS — E785 Hyperlipidemia, unspecified: Secondary | ICD-10-CM

## 2017-01-01 DIAGNOSIS — Z7189 Other specified counseling: Secondary | ICD-10-CM

## 2017-01-01 DIAGNOSIS — M25561 Pain in right knee: Secondary | ICD-10-CM | POA: Diagnosis not present

## 2017-01-01 DIAGNOSIS — Z1211 Encounter for screening for malignant neoplasm of colon: Secondary | ICD-10-CM

## 2017-01-01 DIAGNOSIS — E669 Obesity, unspecified: Secondary | ICD-10-CM

## 2017-01-01 DIAGNOSIS — R7303 Prediabetes: Secondary | ICD-10-CM | POA: Diagnosis not present

## 2017-01-01 DIAGNOSIS — M179 Osteoarthritis of knee, unspecified: Secondary | ICD-10-CM | POA: Diagnosis not present

## 2017-01-01 DIAGNOSIS — K5909 Other constipation: Secondary | ICD-10-CM

## 2017-01-01 DIAGNOSIS — N400 Enlarged prostate without lower urinary tract symptoms: Secondary | ICD-10-CM

## 2017-01-01 NOTE — Assessment & Plan Note (Signed)
Appreciate urology care ([place])

## 2017-01-01 NOTE — Assessment & Plan Note (Signed)
Stable on linzess - continue.  

## 2017-01-01 NOTE — Progress Notes (Signed)
BP 140/70 (BP Location: Right Arm, Cuff Size: Large)   Pulse (!) 59   Temp 97.8 F (36.6 C) (Oral)   Ht _0  (1.803 m)   Wt 234 lb 8 oz (106.4 kg)   SpO2 94%   BMI 32.71 kg/m    CC: medicare wellness visit Subjective:    Patient ID: Joshua Everett, male    DOB: [birth year], 82 y.o.   MRN: [id number]  HPI: Joshua Everett is a 82 y.o. male presenting on 01/01/2017 for Annual Exam (Medicare pt2)   Saw Loanne Drilling last week for medicare wellness visit. Note reviewed. Failed hearing screen   BP elevated today. Denies increased salt intake. Urinary urgency with nocturia x3-4/night. On desmopressin, flomax, myrbetriq followed by Dr Diona Fanti.  Ongoing and worsening R knee pain over last 2 months. Considering stopping golf due to worsening pain with golf swing. This is despite copper brace. Denies inciting trauma/injury. May have started remotely after skiing injury. Has tried nothing for this other than advil. Points to anterior R knee as site of pain.   Preventative: COLONOSCOPY Date: 2010 TAx1, rpt 5 yrs (Dr Ac[facility]an[facility] in [facility]) complicated by marked bradycardia s/p ER evaluation. Discussed options - would like to start with stool kit.  Declines flu shot. prevnar 03/2014, pneumovax 2009 Tdap 2015 zostavax 04/2013 shingrix - discussed Advanced directives - scanned 01/2016. No life prolonging measures if terminal irreversible condition. No HCPOA named.  Seat belt use discussed. Sunscreen use discussed. No changing moles on skin.  Ex smoker quit 1970 Alcohol - seldom  Lives with wife Occ: Nature conservation officer, retired Edu: M[employer] Activity: golfs regularly  Diet: good water, fruits/vegetables daily  Relevant past medical, surgical, family and social history reviewed and updated as indicated. Interim medical history since our last visit reviewed. Allergies and medications reviewed and updated. Outpatient Medications Prior to Visit  Medication Sig Dispense Refill    . aspirin EC 81 MG tablet Take 1 tablet (81 mg total) by mouth once a week.    . B Complex Vitamins (VITAMIN B COMPLEX PO) Take 1 tablet by mouth daily.    . bimatoprost (LUMIGAN) 0.01 % SOLN Place 1 drop into both eyes at bedtime.     . cholecalciferol (VITAMIN D) 1000 units tablet Take 1,000 Units by mouth daily.     . cycloSPORINE (RESTASIS) 0.05 % ophthalmic emulsion Place 1 drop into both eyes 2 (two) times daily.    [name] Kitchen desmopressin (DDAVP) 0.2 MG tablet Take 0.6 mg by mouth at bedtime.     . fenofibrate 160 MG tablet TAKE 1 TABLET DAILY 90 tablet 3  . linaclotide (LINZESS) 290 MCG CAPS capsule Take 1 capsule (290 mcg total) by mouth daily. 90 capsule 3  . mirabegron ER (MYRBETRIQ) 50 MG TB24 tablet Take 50 mg by mouth at bedtime.    . Multiple Vitamins-Minerals (MULTIVITAMIN ADULT PO) Take 1 tablet by mouth daily.    . NON FORMULARY Take 2 drops by mouth daily. TINCTURE OF GARLIC    . omega-3 acid ethyl esters (LOVAZA) 1 g capsule Take 1 g by mouth daily.    . pantoprazole (PROTONIX) 40 MG tablet TAKE 1 TABLET DAILY 90 tablet 3  . ranitidine (ZANTAC) 150 MG capsule Take 150 mg by mouth daily as needed.     . simvastatin (ZOCOR) 40 MG tablet TAKE 1 TABLET DAILY AT 6 P.M. 90 tablet 0  . tamsulosin (FLOMAX) 0.4 MG CAPS capsule TAKE 1 CAPSULE DAILY 90  capsule 2   No facility-administered medications prior to visit.      Per HPI unless specifically indicated in ROS section below Review of Systems     Objective:    BP 140/70 (BP Location: Right Arm, Cuff Size: Large)   Pulse (!) 59   Temp 97.8 F (36.6 C) (Oral)   Ht _0  (1.803 m)   Wt 234 lb 8 oz (106.4 kg)   SpO2 94%   BMI 32.71 kg/m   Wt Readings from Last 3 Encounters:  01/01/17 234 lb 8 oz (106.4 kg)  12/20/16 235 lb 8 oz (106.8 kg)  08/29/16 238 lb 8 oz (108.2 kg)    Physical Exam  Constitutional: He is oriented to person, place, and time. He appears well-developed and well-nourished. No distress.  HENT:  Head:  Normocephalic and atraumatic.  Right Ear: Hearing, tympanic membrane, external ear and ear canal normal.  Left Ear: Hearing, tympanic membrane, external ear and ear canal normal.  Nose: Nose normal.  Mouth/Throat: Uvula is midline, oropharynx is clear and moist and mucous membranes are normal. No oropharyngeal exudate, posterior oropharyngeal edema or posterior oropharyngeal erythema.  Eyes: Pupils are equal, round, and reactive to light. Conjunctivae and EOM are normal. No scleral icterus.  Neck: Normal range of motion. Neck supple. Carotid bruit is not present. No thyromegaly present.  Cardiovascular: Normal rate, regular rhythm, normal heart sounds and intact distal pulses.   No murmur heard. Pulses:      Radial pulses are 2+ on the right side, and 2+ on the left side.  Pulmonary/Chest: Effort normal and breath sounds normal. No respiratory distress. He has no wheezes. He has no rales.  Abdominal: Soft. Bowel sounds are normal. He exhibits no distension and no mass. There is no tenderness. There is no rebound and no guarding.  Musculoskeletal: Normal range of motion. He exhibits no edema.  L knee WNL R knee exam: No deformity on inspection.  No pain with palpation of knee landmarks. No effusion/swelling noted. FROM in flex/extension without crepitus. Mild popliteal fullness. Neg drawer test. Neg mcmurray test. No pain with valgus/varus stress. No PFgrind. No abnormal patellar mobility.   Lymphadenopathy:    He has no cervical adenopathy.  Neurological: He is alert and oriented to person, place, and time.  CN grossly intact, station and gait intact  Skin: Skin is warm and dry. No rash noted.  Psychiatric: He has a normal mood and affect. His behavior is normal. Judgment and thought content normal.  Nursing note and vitals reviewed.  Results for orders placed or performed in visit on 12/20/16  Lipid panel  Result Value Ref Range   Cholesterol 121 0 - 200 mg/dL   Triglycerides  100.0 0.0 - 149.0 mg/dL   HDL 39.10 >39.00 mg/dL   VLDL 20.0 0.0 - 40.0 mg/dL   LDL Cholesterol 62 0 - 99 mg/dL   Total CHOL/HDL Ratio 3    NonHDL 81.79   Hemoglobin A1c  Result Value Ref Range   Hgb A1c MFr Bld 5.7 4.6 - 6.5 %  Comprehensive metabolic panel  Result Value Ref Range   Sodium 139 135 - 145 mEq/L   Potassium 4.0 3.5 - 5.1 mEq/L   Chloride 104 96 - 112 mEq/L   CO2 31 19 - 32 mEq/L   Glucose, Bld 116 (H) 70 - 99 mg/dL   BUN 13 6 - 23 mg/dL   Creatinine, Ser 1.12 0.40 - 1.50 mg/dL   Total Bilirubin 0.6 0.2 -  1.2 mg/dL   Alkaline Phosphatase 34 (L) 39 - 117 U/L   AST 16 0 - 37 U/L   ALT 13 0 - 53 U/L   Total Protein 6.2 6.0 - 8.3 g/dL   Albumin 3.9 3.5 - 5.2 g/dL   Calcium 9.8 8.4 - 10.5 mg/dL   GFR 66.99 >60.00 mL/min  TSH  Result Value Ref Range   TSH 2.56 0.35 - 4.50 uIU/mL      Assessment & Plan:   Problem List Items Addressed This Visit    Advanced care planning/counseling discussion    Advanced directives - scanned 01/2016. No life prolonging measures if terminal irreversible condition. No HCPOA named.       BPH without obstruction/lower urinary tract symptoms    Appreciate urology care ([place])      Chronic constipation    Stable on linzess - continue.      Chronic pain of right knee - Primary    Anticipate osteoarthritis related. Not consistent with meniscal tear or ligament sprain. Start with xrays of R knee today.       Relevant Orders   DG Knee 4 Views W/Patella Right   Dyslipidemia    Continue zocor, fish lovaza, fibrate. Good control on recent labs. The ASCVD Risk score Mikey Bussing DC Jr., et al., 2013) failed to calculate for the following reasons:   The 2013 ASCVD risk score is only valid for ages 78 to 41       Obesity, Class I, BMI 30-34.9    Discussed healthy diet and lifestyle changes.       Prediabetes    Reviewed with patient.Encouraged avoiding added sugars.        Other Visit Diagnoses    Special screening for malignant  neoplasms, colon       Relevant Orders   Fecal occult blood, imunochemical       Follow up plan: Return in about 1 year (around 01/01/2018) for medicare wellness visit.  Ria Bush, MD

## 2017-01-01 NOTE — Patient Instructions (Addendum)
Keep an eye on blood pressure at home and let me know if consistently >140/90.  Pass by lab to pick up stool kit.  If interested, check with pharmacy about new 2 shot shingles series (shingrix).  Continue current medicines. You are doing well today.  Return as needed or in 1 year for next wellness visit.   Health Maintenance, Male A healthy lifestyle and preventive care is important for your health and wellness. Ask your health care provider about what schedule of regular examinations is right for you. What should I know about weight and diet? Eat a Healthy Diet  Eat plenty of vegetables, fruits, whole grains, low-fat dairy products, and lean protein.  Do not eat a lot of foods high in solid fats, added sugars, or salt.  Maintain a Healthy Weight Regular exercise can help you achieve or maintain a healthy weight. You should:  Do at least 150 minutes of exercise each week. The exercise should increase your heart rate and make you sweat (moderate-intensity exercise).  Do strength-training exercises at least twice a week.  Watch Your Levels of Cholesterol and Blood Lipids  Have your blood tested for lipids and cholesterol every 5 years starting at 80 years of age. If you are at high risk for heart disease, you should start having your blood tested when you are 80 years old. You may need to have your cholesterol levels checked more often if: ? Your lipid or cholesterol levels are high. ? You are older than 80 years of age. ? You are at high risk for heart disease.  What should I know about cancer screening? Many types of cancers can be detected early and may often be prevented. Lung Cancer  You should be screened every year for lung cancer if: ? You are a current smoker who has smoked for at least 30 years. ? You are a former smoker who has quit within the past 15 years.  Talk to your health care provider about your screening options, when you should start screening, and how often  you should be screened.  Colorectal Cancer  Routine colorectal cancer screening usually begins at 80 years of age and should be repeated every 5-10 years until you are 80 years old. You may need to be screened more often if early forms of precancerous polyps or small growths are found. Your health care provider may recommend screening at an earlier age if you have risk factors for colon cancer.  Your health care provider may recommend using home test kits to check for hidden blood in the stool.  A small camera at the end of a tube can be used to examine your colon (sigmoidoscopy or colonoscopy). This checks for the earliest forms of colorectal cancer.  Prostate and Testicular Cancer  Depending on your age and overall health, your health care provider may do certain tests to screen for prostate and testicular cancer.  Talk to your health care provider about any symptoms or concerns you have about testicular or prostate cancer.  Skin Cancer  Check your skin from head to toe regularly.  Tell your health care provider about any new moles or changes in moles, especially if: ? There is a change in a mole's size, shape, or color. ? You have a mole that is larger than a pencil eraser.  Always use sunscreen. Apply sunscreen liberally and repeat throughout the day.  Protect yourself by wearing long sleeves, pants, a wide-brimmed hat, and sunglasses when outside.  What should I  know about heart disease, diabetes, and high blood pressure?  If you are 20-46 years of age, have your blood pressure checked every 3-5 years. If you are 58 years of age or older, have your blood pressure checked every year. You should have your blood pressure measured twice-once when you are at a hospital or clinic, and once when you are not at a hospital or clinic. Record the average of the two measurements. To check your blood pressure when you are not at a hospital or clinic, you can use: ? An automated blood pressure  machine at a pharmacy. ? A home blood pressure monitor.  Talk to your health care provider about your target blood pressure.  If you are between 21-56 years old, ask your health care provider if you should take aspirin to prevent heart disease.  Have regular diabetes screenings by checking your fasting blood sugar level. ? If you are at a normal weight and have a low risk for diabetes, have this test once every three years after the age of 53. ? If you are overweight and have a high risk for diabetes, consider being tested at a younger age or more often.  A one-time screening for abdominal aortic aneurysm (AAA) by ultrasound is recommended for men aged 46-75 years who are current or former smokers. What should I know about preventing infection? Hepatitis B If you have a higher risk for hepatitis B, you should be screened for this virus. Talk with your health care provider to find out if you are at risk for hepatitis B infection. Hepatitis C Blood testing is recommended for:  Everyone born from [birth year] through 1963.  Anyone with known risk factors for hepatitis C.  Sexually Transmitted Diseases (STDs)  You should be screened each year for STDs including gonorrhea and chlamydia if: ? You are sexually active and are younger than 80 years of age. ? You are older than 80 years of age and your health care provider tells you that you are at risk for this type of infection. ? Your sexual activity has changed since you were last screened and you are at an increased risk for chlamydia or gonorrhea. Ask your health care provider if you are at risk.  Talk with your health care provider about whether you are at high risk of being infected with HIV. Your health care provider may recommend a prescription medicine to help prevent HIV infection.  What else can I do?  Schedule regular health, dental, and eye exams.  Stay current with your vaccines (immunizations).  Do not use any tobacco products,  such as cigarettes, chewing tobacco, and e-cigarettes. If you need help quitting, ask your health care provider.  Limit alcohol intake to no more than 2 drinks per day. One drink equals 12 ounces of beer, 5 ounces of wine, or 1 ounces of hard liquor.  Do not use street drugs.  Do not share needles.  Ask your health care provider for help if you need support or information about quitting drugs.  Tell your health care provider if you often feel depressed.  Tell your health care provider if you have ever been abused or do not feel safe at home. This information is not intended to replace advice given to you by your health care provider. Make sure you discuss any questions you have with your health care provider. Document Released: 11/18/2007 Document Revised: 01/19/2016 Document Reviewed: 02/23/2015 Elsevier Interactive Patient Education  [employer].

## 2017-01-01 NOTE — Assessment & Plan Note (Addendum)
Continue zocor, fish lovaza, fibrate. Good control on recent labs. The ASCVD Risk score Mikey Bussing DC Jr., et al., 2013) failed to calculate for the following reasons:   The 2013 ASCVD risk score is only valid for ages 33 to 40

## 2017-01-01 NOTE — Assessment & Plan Note (Addendum)
Anticipate osteoarthritis related. Not consistent with meniscal tear or ligament sprain. Start with xrays of R knee today.

## 2017-01-01 NOTE — Assessment & Plan Note (Signed)
Advanced directives - scanned 01/2016. No life prolonging measures if terminal irreversible condition. No HCPOA named.  

## 2017-01-01 NOTE — Addendum Note (Signed)
Addended by: Marchia Bond on: 01/01/2017 03:41 PM   Modules accepted: Orders

## 2017-01-01 NOTE — Assessment & Plan Note (Signed)
Reviewed with patient.Encouraged avoiding added sugars.

## 2017-01-01 NOTE — Assessment & Plan Note (Signed)
Discussed healthy diet and lifestyle changes 

## 2017-01-02 ENCOUNTER — Ambulatory Visit (INDEPENDENT_AMBULATORY_CARE_PROVIDER_SITE_OTHER)
Admission: RE | Admit: 2017-01-02 | Discharge: 2017-01-02 | Disposition: A | Discharge status: Home / Self Care | Payer: Medicare Other | Source: Ambulatory Visit | Attending: Family Medicine | Admitting: Family Medicine

## 2017-01-02 ENCOUNTER — Other Ambulatory Visit: Payer: Self-pay | Admitting: Family Medicine

## 2017-01-02 DIAGNOSIS — M25561 Pain in right knee: Secondary | ICD-10-CM

## 2017-01-05 ENCOUNTER — Other Ambulatory Visit (INDEPENDENT_AMBULATORY_CARE_PROVIDER_SITE_OTHER): Payer: Medicare Other

## 2017-01-05 ENCOUNTER — Encounter: Payer: Self-pay | Admitting: *Deleted

## 2017-01-05 DIAGNOSIS — Z1211 Encounter for screening for malignant neoplasm of colon: Secondary | ICD-10-CM

## 2017-01-05 LAB — FECAL OCCULT BLOOD, IMMUNOCHEMICAL: FECAL OCCULT BLD: NEGATIVE

## 2017-01-12 ENCOUNTER — Encounter: Payer: Self-pay | Admitting: Family Medicine

## 2017-01-12 ENCOUNTER — Ambulatory Visit (INDEPENDENT_AMBULATORY_CARE_PROVIDER_SITE_OTHER): Payer: Medicare Other | Admitting: Family Medicine

## 2017-01-12 VITALS — BP 138/62 | HR 59 | Temp 98.0°F | Resp 16 | Ht 71.0 in | Wt 234.0 lb

## 2017-01-12 DIAGNOSIS — H6122 Impacted cerumen, left ear: Secondary | ICD-10-CM | POA: Insufficient documentation

## 2017-01-12 NOTE — Progress Notes (Signed)
BP 138/62   Pulse (!) 59   Temp 98 F (36.7 C) (Oral)   Resp 16   Ht 5\' 11"  (1.803 m)   Wt 234 lb (106.1 kg)   SpO2 98%   BMI 32.64 kg/m    CC: L ear congestion Subjective:    Patient ID: Joshua Everett, male    DOB: 1934-10-24, 82 y.o.   MRN: [id number]  HPI: Joshua Everett is a 82 y.o. male presenting on 01/12/2017 for Ear Pain   Has been using debrox into both ears. Right ear cleared, but left ear started getting clogged with loss of hearing. No ear pain.   Relevant past medical, surgical, family and social history reviewed and updated as indicated. Interim medical history since our last visit reviewed. Allergies and medications reviewed and updated. Outpatient Medications Prior to Visit  Medication Sig Dispense Refill  . aspirin EC 81 MG tablet Take 1 tablet (81 mg total) by mouth once a week.    . B Complex Vitamins (VITAMIN B COMPLEX PO) Take 1 tablet by mouth daily.    . bimatoprost (LUMIGAN) 0.01 % SOLN Place 1 drop into both eyes at bedtime.     . cholecalciferol (VITAMIN D) 1000 units tablet Take 1,000 Units by mouth daily.     . cycloSPORINE (RESTASIS) 0.05 % ophthalmic emulsion Place 1 drop into both eyes 2 (two) times daily.    [name] Kitchen desmopressin (DDAVP) 0.2 MG tablet Take 0.6 mg by mouth at bedtime.     . fenofibrate 160 MG tablet TAKE 1 TABLET DAILY 90 tablet 3  . linaclotide (LINZESS) 290 MCG CAPS capsule Take 1 capsule (290 mcg total) by mouth daily. 90 capsule 3  . mirabegron ER (MYRBETRIQ) 50 MG TB24 tablet Take 50 mg by mouth at bedtime.    . Multiple Vitamins-Minerals (MULTIVITAMIN ADULT PO) Take 1 tablet by mouth daily.    . NON FORMULARY Take 2 drops by mouth daily. TINCTURE OF GARLIC    . omega-3 acid ethyl esters (LOVAZA) 1 g capsule Take 1 g by mouth daily.    . pantoprazole (PROTONIX) 40 MG tablet TAKE 1 TABLET DAILY 90 tablet 3  . ranitidine (ZANTAC) 150 MG capsule Take 150 mg by mouth daily as needed.     . simvastatin (ZOCOR) 40 MG tablet TAKE  1 TABLET DAILY AT 6 P.M. 90 tablet 0  . tamsulosin (FLOMAX) 0.4 MG CAPS capsule TAKE 1 CAPSULE DAILY 90 capsule 2   No facility-administered medications prior to visit.      Per HPI unless specifically indicated in ROS section below Review of Systems     Objective:    BP 138/62   Pulse (!) 59   Temp 98 F (36.7 C) (Oral)   Resp 16   Ht 5\' 11"  (1.803 m)   Wt 234 lb (106.1 kg)   SpO2 98%   BMI 32.64 kg/m   Wt Readings from Last 3 Encounters:  01/12/17 234 lb (106.1 kg)  01/01/17 234 lb 8 oz (106.4 kg)  12/20/16 235 lb 8 oz (106.8 kg)    Physical Exam  Constitutional: He appears well-developed and well-nourished. No distress.  HENT:  Right Ear: Hearing, tympanic membrane, external ear and ear canal normal.  Left Ear: External ear and ear canal normal.  Cerumen impaction fully covering L TM s/p successful irrigation in office No ear pain  Nursing note and vitals reviewed.     Assessment & Plan:   Problem List Items Addressed  This Visit    Hearing loss of left ear due to cerumen impaction - Primary    Successful irrigation performed today.           Follow up plan: No Follow-up on file.  Ria Bush, MD

## 2017-01-12 NOTE — Patient Instructions (Signed)
Earwax Buildup, Adult The ears produce a substance called earwax that helps keep bacteria out of the ear and protects the skin in the ear canal. Occasionally, earwax can build up in the ear and cause discomfort or hearing loss. What increases the risk? This condition is more likely to develop in people who:  Are male.  Are elderly.  Naturally produce more earwax.  Clean their ears often with cotton swabs.  Use earplugs often.  Use in-ear headphones often.  Wear hearing aids.  Have narrow ear canals.  Have earwax that is overly thick or sticky.  Have eczema.  Are dehydrated.  Have excess hair in the ear canal.  What are the signs or symptoms? Symptoms of this condition include:  Reduced or muffled hearing.  A feeling of fullness in the ear or feeling that the ear is plugged.  Fluid coming from the ear.  Ear pain.  Ear itch.  Ringing in the ear.  Coughing.  An obvious piece of earwax that can be seen inside the ear canal.  How is this diagnosed? This condition may be diagnosed based on:  Your symptoms.  Your medical history.  An ear exam. During the exam, your health care provider will look into your ear with an instrument called an otoscope.  You may have tests, including a hearing test. How is this treated? This condition may be treated by:  Using ear drops to soften the earwax.  Having the earwax removed by a health care provider. The health care provider may: ? Flush the ear with water. ? Use an instrument that has a loop on the end (curette). ? Use a suction device.  Surgery to remove the wax buildup. This may be done in severe cases.  Follow these instructions at home:  Take over-the-counter and prescription medicines only as told by your health care provider.  Do not put any objects, including cotton swabs, into your ear. You can clean the opening of your ear canal with a washcloth or facial tissue.  Follow instructions from your health  care provider about cleaning your ears. Do not over-clean your ears.  Drink enough fluid to keep your urine clear or pale yellow. This will help to thin the earwax.  Keep all follow-up visits as told by your health care provider. If earwax builds up in your ears often or if you use hearing aids, consider seeing your health care provider for routine, preventive ear cleanings. Ask your health care provider how often you should schedule your cleanings.  If you have hearing aids, clean them according to instructions from the manufacturer and your health care provider. Contact a health care provider if:  You have ear pain.  You develop a fever.  You have blood, pus, or other fluid coming from your ear.  You have hearing loss.  You have ringing in your ears that does not go away.  Your symptoms do not improve with treatment.  You feel like the room is spinning (vertigo). Summary  Earwax can build up in the ear and cause discomfort or hearing loss.  The most common symptoms of this condition include reduced or muffled hearing and a feeling of fullness in the ear or feeling that the ear is plugged.  This condition may be diagnosed based on your symptoms, your medical history, and an ear exam.  This condition may be treated by using ear drops to soften the earwax or by having the earwax removed by a health care provider.  Do   not put any objects, including cotton swabs, into your ear. You can clean the opening of your ear canal with a washcloth or facial tissue. This information is not intended to replace advice given to you by your health care provider. Make sure you discuss any questions you have with your health care provider. Document Released: 06/29/2004 Document Revised: 08/02/2016 Document Reviewed: 08/02/2016 Elsevier Interactive Patient Education  2018 [employer] Inc[employer]  

## 2017-01-12 NOTE — Assessment & Plan Note (Signed)
Successful irrigation performed today.

## 2017-02-03 ENCOUNTER — Other Ambulatory Visit: Payer: Self-pay | Admitting: Gastroenterology

## 2017-02-15 ENCOUNTER — Other Ambulatory Visit: Payer: Self-pay | Admitting: Family Medicine

## 2017-02-20 DIAGNOSIS — R351 Nocturia: Secondary | ICD-10-CM | POA: Diagnosis not present

## 2017-02-24 ENCOUNTER — Other Ambulatory Visit: Payer: Self-pay | Admitting: Family Medicine

## 2017-02-26 DIAGNOSIS — Z85828 Personal history of other malignant neoplasm of skin: Secondary | ICD-10-CM | POA: Diagnosis not present

## 2017-02-26 DIAGNOSIS — L820 Inflamed seborrheic keratosis: Secondary | ICD-10-CM | POA: Diagnosis not present

## 2017-02-26 DIAGNOSIS — D1800 Hemangioma unspecified site: Secondary | ICD-10-CM | POA: Diagnosis not present

## 2017-02-26 DIAGNOSIS — D229 Melanocytic nevi, unspecified: Secondary | ICD-10-CM | POA: Diagnosis not present

## 2017-02-26 DIAGNOSIS — L718 Other rosacea: Secondary | ICD-10-CM | POA: Diagnosis not present

## 2017-03-27 DIAGNOSIS — R351 Nocturia: Secondary | ICD-10-CM | POA: Diagnosis not present

## 2017-03-27 DIAGNOSIS — N3941 Urge incontinence: Secondary | ICD-10-CM | POA: Diagnosis not present

## 2017-03-27 DIAGNOSIS — R350 Frequency of micturition: Secondary | ICD-10-CM | POA: Diagnosis not present

## 2017-03-27 DIAGNOSIS — N401 Enlarged prostate with lower urinary tract symptoms: Secondary | ICD-10-CM | POA: Diagnosis not present

## 2017-03-31 ENCOUNTER — Other Ambulatory Visit: Payer: Self-pay | Admitting: Family Medicine

## 2017-04-02 DIAGNOSIS — H401131 Primary open-angle glaucoma, bilateral, mild stage: Secondary | ICD-10-CM | POA: Diagnosis not present

## 2017-04-18 DIAGNOSIS — N3941 Urge incontinence: Secondary | ICD-10-CM | POA: Diagnosis not present

## 2017-04-18 DIAGNOSIS — R351 Nocturia: Secondary | ICD-10-CM | POA: Diagnosis not present

## 2017-05-07 DIAGNOSIS — N401 Enlarged prostate with lower urinary tract symptoms: Secondary | ICD-10-CM | POA: Diagnosis not present

## 2017-05-07 DIAGNOSIS — R351 Nocturia: Secondary | ICD-10-CM | POA: Diagnosis not present

## 2017-05-21 ENCOUNTER — Encounter: Payer: Self-pay | Admitting: Family Medicine

## 2017-05-21 DIAGNOSIS — N3281 Overactive bladder: Secondary | ICD-10-CM | POA: Insufficient documentation

## 2017-06-12 DIAGNOSIS — N3941 Urge incontinence: Secondary | ICD-10-CM | POA: Diagnosis not present

## 2017-06-19 DIAGNOSIS — N3941 Urge incontinence: Secondary | ICD-10-CM | POA: Diagnosis not present

## 2017-06-26 DIAGNOSIS — N3941 Urge incontinence: Secondary | ICD-10-CM | POA: Diagnosis not present

## 2017-07-03 DIAGNOSIS — N3941 Urge incontinence: Secondary | ICD-10-CM | POA: Diagnosis not present

## 2017-07-10 DIAGNOSIS — N3941 Urge incontinence: Secondary | ICD-10-CM | POA: Diagnosis not present

## 2017-07-24 DIAGNOSIS — R351 Nocturia: Secondary | ICD-10-CM | POA: Diagnosis not present

## 2017-07-24 DIAGNOSIS — N3941 Urge incontinence: Secondary | ICD-10-CM | POA: Diagnosis not present

## 2017-07-31 DIAGNOSIS — N3941 Urge incontinence: Secondary | ICD-10-CM | POA: Diagnosis not present

## 2017-08-07 DIAGNOSIS — N3941 Urge incontinence: Secondary | ICD-10-CM | POA: Diagnosis not present

## 2017-08-08 ENCOUNTER — Other Ambulatory Visit: Payer: Self-pay | Admitting: Gastroenterology

## 2017-08-14 DIAGNOSIS — N3941 Urge incontinence: Secondary | ICD-10-CM | POA: Diagnosis not present

## 2017-08-21 DIAGNOSIS — N3941 Urge incontinence: Secondary | ICD-10-CM | POA: Diagnosis not present

## 2017-08-28 DIAGNOSIS — L821 Other seborrheic keratosis: Secondary | ICD-10-CM | POA: Diagnosis not present

## 2017-08-28 DIAGNOSIS — D229 Melanocytic nevi, unspecified: Secondary | ICD-10-CM | POA: Diagnosis not present

## 2017-08-28 DIAGNOSIS — D1800 Hemangioma unspecified site: Secondary | ICD-10-CM | POA: Diagnosis not present

## 2017-08-28 DIAGNOSIS — L570 Actinic keratosis: Secondary | ICD-10-CM | POA: Diagnosis not present

## 2017-08-28 DIAGNOSIS — Z85828 Personal history of other malignant neoplasm of skin: Secondary | ICD-10-CM | POA: Diagnosis not present

## 2017-08-28 DIAGNOSIS — L820 Inflamed seborrheic keratosis: Secondary | ICD-10-CM | POA: Diagnosis not present

## 2017-08-28 DIAGNOSIS — Z1283 Encounter for screening for malignant neoplasm of skin: Secondary | ICD-10-CM | POA: Diagnosis not present

## 2017-08-30 DIAGNOSIS — N3941 Urge incontinence: Secondary | ICD-10-CM | POA: Diagnosis not present

## 2017-09-04 DIAGNOSIS — N3941 Urge incontinence: Secondary | ICD-10-CM | POA: Diagnosis not present

## 2017-09-24 DIAGNOSIS — H401131 Primary open-angle glaucoma, bilateral, mild stage: Secondary | ICD-10-CM | POA: Diagnosis not present

## 2017-09-25 DIAGNOSIS — N3941 Urge incontinence: Secondary | ICD-10-CM | POA: Diagnosis not present

## 2017-10-02 ENCOUNTER — Telehealth: Payer: Self-pay | Admitting: Gastroenterology

## 2017-10-02 NOTE — Telephone Encounter (Signed)
Advised pt's wife, I will be available for her to drop off the paperwork in [place] this week.

## 2017-10-02 NOTE — Telephone Encounter (Signed)
Pt wife left vm for Ginger in regards to pt she has letters for [facility] that need to be filled out she would like to drop them off for [facility]inger please call pt

## 2017-10-08 DIAGNOSIS — H401131 Primary open-angle glaucoma, bilateral, mild stage: Secondary | ICD-10-CM | POA: Diagnosis not present

## 2017-10-16 ENCOUNTER — Other Ambulatory Visit: Payer: Self-pay | Admitting: Gastroenterology

## 2017-10-16 DIAGNOSIS — K219 Gastro-esophageal reflux disease without esophagitis: Secondary | ICD-10-CM

## 2017-10-18 DIAGNOSIS — N3941 Urge incontinence: Secondary | ICD-10-CM | POA: Diagnosis not present

## 2017-11-06 DIAGNOSIS — N3941 Urge incontinence: Secondary | ICD-10-CM | POA: Diagnosis not present

## 2017-11-12 ENCOUNTER — Telehealth: Payer: Self-pay | Admitting: Gastroenterology

## 2017-11-12 NOTE — Telephone Encounter (Signed)
PT WIFE LEFT VM  SHE STATES PT RX [facility] NEED AUTHORIZATION THROUGH [facility] PLEASE CALL THEM TO AUTHORIZE RX

## 2017-11-16 NOTE — Telephone Encounter (Signed)
Authorization submitted to [facility].

## 2017-11-18 ENCOUNTER — Other Ambulatory Visit: Payer: Self-pay | Admitting: Family Medicine

## 2017-11-20 NOTE — Telephone Encounter (Signed)
Contacted [facility] and was advised Linzess has been approved.

## 2017-12-03 DIAGNOSIS — N3941 Urge incontinence: Secondary | ICD-10-CM | POA: Diagnosis not present

## 2017-12-24 ENCOUNTER — Other Ambulatory Visit: Payer: Self-pay | Admitting: Family Medicine

## 2017-12-24 DIAGNOSIS — R7303 Prediabetes: Secondary | ICD-10-CM

## 2017-12-24 DIAGNOSIS — E559 Vitamin D deficiency, unspecified: Secondary | ICD-10-CM

## 2017-12-24 DIAGNOSIS — E785 Hyperlipidemia, unspecified: Secondary | ICD-10-CM

## 2017-12-25 DIAGNOSIS — N3941 Urge incontinence: Secondary | ICD-10-CM | POA: Diagnosis not present

## 2017-12-26 ENCOUNTER — Ambulatory Visit (INDEPENDENT_AMBULATORY_CARE_PROVIDER_SITE_OTHER): Payer: Medicare Other

## 2017-12-26 VITALS — BP 134/80 | HR 58 | Temp 97.5°F | Ht 70.0 in | Wt 233.2 lb

## 2017-12-26 DIAGNOSIS — Z0000 Encounter for general adult medical examination without abnormal findings: Secondary | ICD-10-CM

## 2017-12-26 DIAGNOSIS — R7303 Prediabetes: Secondary | ICD-10-CM

## 2017-12-26 DIAGNOSIS — E785 Hyperlipidemia, unspecified: Secondary | ICD-10-CM

## 2017-12-26 LAB — COMPREHENSIVE METABOLIC PANEL
ALBUMIN: 4 g/dL (ref 3.5–5.2)
ALK PHOS: 36 U/L — AB (ref 39–117)
ALT: 16 U/L (ref 0–53)
AST: 17 U/L (ref 0–37)
BUN: 15 mg/dL (ref 6–23)
CALCIUM: 9.8 mg/dL (ref 8.4–10.5)
CO2: 32 mEq/L (ref 19–32)
CREATININE: 1.04 mg/dL (ref 0.40–1.50)
Chloride: 103 mEq/L (ref 96–112)
GFR: 72.78 mL/min (ref >60.00)
Glucose, Bld: 111 mg/dL — ABNORMAL HIGH (ref 70–99)
POTASSIUM: 4.3 meq/L (ref 3.5–5.1)
SODIUM: 139 meq/L (ref 135–145)
TOTAL PROTEIN: 6.7 g/dL (ref 6.0–8.3)
Total Bilirubin: 0.7 mg/dL (ref 0.2–1.2)

## 2017-12-26 LAB — LIPID PANEL
CHOLESTEROL: 122 mg/dL (ref 0–200)
HDL: 41.9 mg/dL (ref >39.00)
LDL Cholesterol: 62 mg/dL (ref 0–99)
NonHDL: 80.5
Total CHOL/HDL Ratio: 3
Triglycerides: 92 mg/dL (ref 0.0–149.0)
VLDL: 18.4 mg/dL (ref 0.0–40.0)

## 2017-12-26 LAB — HEMOGLOBIN A1C: HEMOGLOBIN A1C: 5.8 % (ref 4.6–6.5)

## 2017-12-26 NOTE — Patient Instructions (Signed)
Joshua Everett , Thank you for taking time to come for your Medicare Wellness Visit. I appreciate your ongoing commitment to your health goals. Please review the following plan we discussed and let me know if I can assist you in the future.   These are the goals we discussed: Goals    . Increase physical activity     Starting 12/26/2017, I will continue exercise for 15 minutes twice weekly.        This is a list of the screening recommended for you and due dates:  Health Maintenance  Topic Date Due  . Flu Shot  01/03/2018  . DTaP/Tdap/Td vaccine (2 - Td) 07/22/2023  . Tetanus Vaccine  07/22/2023  . Pneumonia vaccines  Completed   Preventive Care for Adults  A healthy lifestyle and preventive care can promote health and wellness. Preventive health guidelines for adults include the following key practices.  . A routine yearly physical is a good way to check with your health care provider about your health and preventive screening. It is a chance to share any concerns and updates on your health and to receive a thorough exam.  . Visit your dentist for a routine exam and preventive care every 6 months. Brush your teeth twice a day and floss once a day. Good oral hygiene prevents tooth decay and gum disease.  . The frequency of eye exams is based on your age, health, family medical history, use  of contact lenses, and other factors. Follow your health care provider's recommendations for frequency of eye exams.  . Eat a healthy diet. Foods like vegetables, fruits, whole grains, low-fat dairy products, and lean protein foods contain the nutrients you need without too many calories. Decrease your intake of foods high in solid fats, added sugars, and salt. Eat the right amount of calories for you. Get information about a proper diet from your health care provider, if necessary.  . Regular physical exercise is one of the most important things you can do for your health. Most adults should get at  least 150 minutes of moderate-intensity exercise (any activity that increases your heart rate and causes you to sweat) each week. In addition, most adults need muscle-strengthening exercises on 2 or more days a week.  Silver Sneakers may be a benefit available to you. To determine eligibility, you may visit the website: www.silversneakers.com or contact program at 9014131239 Mon-Fri between 8AM-8PM.   . Maintain a healthy weight. The body mass index (BMI) is a screening tool to identify possible weight problems. It provides an estimate of body fat based on height and weight. Your health care provider can find your BMI and can help you achieve or maintain a healthy weight.   For adults 20 years and older: ? A BMI below 18.5 is considered underweight. ? A BMI of 18.5 to 24.9 is normal. ? A BMI of 25 to 29.9 is considered overweight. ? A BMI of 30 and above is considered obese.   . Maintain normal blood lipids and cholesterol levels by exercising and minimizing your intake of saturated fat. Eat a balanced diet with plenty of fruit and vegetables. Blood tests for lipids and cholesterol should begin at age 37 and be repeated every 5 years. If your lipid or cholesterol levels are high, you are over 50, or you are at high risk for heart disease, you may need your cholesterol levels checked more frequently. Ongoing high lipid and cholesterol levels should be treated with medicines if diet and  exercise are not working.  . If you smoke, find out from your health care provider how to quit. If you do not use tobacco, please do not start.  . If you choose to drink alcohol, please do not consume more than 2 drinks per day. One drink is considered to be 12 ounces (355 mL) of beer, 5 ounces (148 mL) of wine, or 1.5 ounces (44 mL) of liquor.  . If you are 14-49 years old, ask your health care provider if you should take aspirin to prevent strokes.  . Use sunscreen. Apply sunscreen liberally and repeatedly  throughout the day. You should seek shade when your shadow is shorter than you. Protect yourself by wearing long sleeves, pants, a wide-brimmed hat, and sunglasses year round, whenever you are outdoors.  . Once a month, do a whole body skin exam, using a mirror to look at the skin on your back. Tell your health care provider of new moles, moles that have irregular borders, moles that are larger than a pencil eraser, or moles that have changed in shape or color.

## 2017-12-26 NOTE — Progress Notes (Signed)
Subjective:   Joshua Everett is a 83 y.o. male who presents for Medicare Annual/Subsequent preventive examination.  Review of Systems:  N/A Cardiac Risk Factors include: advanced age (>60men, >36 women);male gender;obesity (BMI >30kg/m2);dyslipidemia;hypertension     Objective:    Vitals: BP 134/80 (BP Location: Right Arm, Patient Position: Sitting, Cuff Size: Normal)   Pulse (!) 58   Temp (!) 97.5 F (36.4 C) (Oral)   Ht 5\' 10"  (1.778 m) Comment: no shoes  Wt 233 lb 4 oz (105.8 kg)   SpO2 96%   BMI 33.47 kg/m   Body mass index is 33.47 kg/m.  Advanced Directives 12/26/2017 12/20/2016 08/17/2016 08/17/2016 11/11/2015 05/10/2015  Does Patient Have a Medical Advance Directive? Yes Yes Yes No Yes Yes  Type of Paramedic of [place];Living will [place];Living will Living will;Healthcare Power of [place];Living will Living will  Does patient want to make changes to medical advance directive? - - No - Patient declined - No - Patient declined -  Copy of [place] in Chart? No - copy requested Yes No - copy requested - No - copy requested No - copy requested  Would patient like information on creating a medical advance directive? - - - No - Patient declined - -    Tobacco Social History   Tobacco Use  Smoking Status Former Smoker  . Last attempt to quit: 06/05/1968  . Years since quitting: 49.5  Smokeless Tobacco Never Used     Counseling given: No   Clinical Intake:  Pre-visit preparation completed: Yes  Pain : No/denies pain Pain Score: 0-No pain     Nutritional Status: BMI > 30  Obese Nutritional Risks: None Diabetes: No  How often do you need to have someone help you when you read instructions, pamphlets, or other written materials from your doctor or pharmacy?: 1 - Never What is the last grade level you completed in school?: Masters degree  Interpreter Needed?:  No  Comments: pt lives with spouse Information entered by :: LPinson, LPN  Past Medical History:  Diagnosis Date  . BPH without obstruction/lower urinary tract symptoms 09/08/2014  . Chronic constipation 09/08/2014  . GERD (gastroesophageal reflux disease)   . Glaucoma   . History of chicken pox   . Hyperlipidemia   . Obesity, Class I, BMI 30-34.9 09/08/2014  . Prediabetes 07/31/2015  . Rosacea 07/06/2015  . Urine incontinence    Past Surgical History:  Procedure Laterality Date  . COLONOSCOPY  03/2009   1 TA, diverticulosis, rpt 5 yrs (Dr [facility] in [facility])  . ESOPHAGOGASTRODUODENOSCOPY  03/2009   small HH, gastric polyps biopsied, dilation for dysphagia ([facility])  . HEMORRHOID SURGERY  1976   Family History  Problem Relation Age of Onset  . Diabetes Mother   . Alzheimer's disease Mother 74  . Kidney disease Father   . Cancer Father        brain tumor  . Cancer Sister        breast   Social History   Socioeconomic History  . Marital status: Married    Spouse name: Not on file  . Number of children: Not on file  . Years of education: Not on file  . Highest education level: Not on file  Occupational History  . Not on file  Social Needs  . Financial resource strain: Not on file  . Food insecurity:    Worry:  Not on file    Inability: Not on file  . Transportation needs:    Medical: Not on file    Non-medical: Not on file  Tobacco Use  . Smoking status: Former Smoker    Last attempt to quit: 06/05/1968    Years since quitting: 49.5  . Smokeless tobacco: Never Used  Substance and Sexual Activity  . Alcohol use: Yes    Alcohol/week: 0.0 oz    Comment: minimal  . Drug use: No  . Sexual activity: Never  Lifestyle  . Physical activity:    Days per week: Not on file    Minutes per session: Not on file  . Stress: Not on file  Relationships  . Social connections:    Talks on phone: Not on file    Gets together: Not on file    Attends  religious service: Not on file    Active member of club or organization: Not on file    Attends meetings of clubs or organizations: Not on file    Relationship status: Not on file  Other Topics Concern  . Not on file  Social History Narrative   Lives with wife   Occ: Nature conservation officer, retired   Edu: MBA [employer]   Activity: golfs regularly   Diet: good water, fruits/vegetables daily    Outpatient Encounter Medications as of 12/26/2017  Medication Sig  . aspirin EC 81 MG tablet Take 1 tablet (81 mg total) by mouth once a week.  . B Complex Vitamins (VITAMIN B COMPLEX PO) Take 1 tablet by mouth daily.  . bimatoprost (LUMIGAN) 0.01 % SOLN Place 1 drop into both eyes at bedtime.   . cholecalciferol (VITAMIN D) 1000 units tablet Take 1,000 Units by mouth daily.   . cycloSPORINE (RESTASIS) 0.05 % ophthalmic emulsion Place 1 drop into both eyes 2 (two) times daily.  [name] Kitchen desmopressin (DDAVP) 0.2 MG tablet Take 0.6 mg by mouth at bedtime.   . fenofibrate 160 MG tablet TAKE 1 TABLET DAILY  . linaclotide (LINZESS) 290 MCG CAPS capsule Take 1 capsule (290 mcg total) by mouth daily before breakfast.  . mirabegron ER (MYRBETRIQ) 50 MG TB24 tablet Take 50 mg by mouth at bedtime.  . Multiple Vitamins-Minerals (MULTIVITAMIN ADULT PO) Take 1 tablet by mouth daily.  . NON FORMULARY Take 2 drops by mouth daily. TINCTURE OF GARLIC  . omega-3 acid ethyl esters (LOVAZA) 1 g capsule Take 1 g by mouth daily.  . pantoprazole (PROTONIX) 40 MG tablet TAKE 1 TABLET DAILY  . ranitidine (ZANTAC) 150 MG capsule Take 150 mg by mouth daily as needed.   . simvastatin (ZOCOR) 40 MG tablet TAKE 1 TABLET DAILY AT 6 P.M.  . [DISCONTINUED] tamsulosin (FLOMAX) 0.4 MG CAPS capsule Take 1 capsule (0.4 mg total) by mouth daily.   No facility-administered encounter medications on file as of 12/26/2017.     Activities of Daily Living In your present state of health, do you have any difficulty performing the following activities:  12/26/2017  Hearing? Y  Vision? N  Difficulty concentrating or making decisions? Y  Walking or climbing stairs? N  Dressing or bathing? N  Doing errands, shopping? N  Preparing Food and eating ? N  Using the Toilet? N  In the past six months, have you accidently leaked urine? Y  Do you have problems with loss of bowel control? N  Managing your Medications? N  Managing your Finances? N  Housekeeping or managing your Housekeeping? N  Some recent data  might be hidden    Patient Care Team: Ria Bush, MD as PCP - General (Family Medicine) Leandrew Koyanagi, MD as Referring Physician (Ophthalmology) Lucilla Lame, MD as Consulting Physician (Gastroenterology) Franchot Gallo, MD as Consulting Physician (Urology) Ralene Bathe, MD as Consulting Physician (Dermatology)   Assessment:   This is a routine wellness examination for Amilio.   Hearing Screening   125Hz  250Hz  500Hz  1000Hz  2000Hz  3000Hz  4000Hz  6000Hz  8000Hz   Right ear:   40 40 40  0    Left ear:   40 40 40  0    Vision Screening Comments: Vision exam in Jun 2019 with Dr. Wallace Going    Exercise Activities and Dietary recommendations Current Exercise Habits: Home exercise routine, Type of exercise: Other - see comments(stationary bike), Time (Minutes): 15, Frequency (Times/Week): 2, Weekly Exercise (Minutes/Week): 30, Intensity: Mild, Exercise limited by: None identified  Goals    . Increase physical activity     Starting 12/26/2017, I will continue exercise for 15 minutes twice weekly.        Fall Risk Fall Risk  12/26/2017 12/20/2016 12/20/2015 11/11/2015 05/10/2015  Falls in the past year? No No No No No   Depression Screen PHQ 2/9 Scores 12/26/2017 12/20/2016 12/20/2015 11/11/2015  PHQ - 2 Score 0 0 0 0  PHQ- 9 Score 0 - - -    Cognitive Function MMSE - Mini Mental State Exam 12/26/2017 12/20/2016 11/11/2015  Orientation to time 5 5 5   Orientation to Place 5 5 5   Registration 3 3 3   Attention/ Calculation 0  0 0  Recall 3 2 3   Language- name 2 objects 0 0 0  Language- repeat 1 1 1   Language- follow 3 step command 3 3 3   Language- read & follow direction 0 0 0  Write a sentence 0 0 0  Copy design 0 0 0  Total score 20 19 20      PLEASE NOTE: A Mini-Cog screen was completed. Maximum score is 20. A value of 0 denotes this part of Folstein MMSE was not completed or the patient failed this part of the Mini-Cog screening.   Mini-Cog Screening Orientation to Time - Max 5 pts Orientation to Place - Max 5 pts Registration - Max 3 pts Recall - Max 3 pts Language Repeat - Max 1 pts Language Follow 3 Step Command - Max 3 pts     Immunization History  Administered Date(s) Administered  . Influenza-Unspecified 03/23/2014  . Pneumococcal Conjugate-13 03/23/2014  . Pneumococcal Polysaccharide-23 06/06/2007  . Tdap 07/21/2013  . Zoster 05/04/2013    Screening Tests Health Maintenance  Topic Date Due  . INFLUENZA VACCINE  01/03/2018  . DTaP/Tdap/Td (2 - Td) 07/22/2023  . TETANUS/TDAP  07/22/2023  . PNA vac Low Risk Adult  Completed     Plan:   I have personally reviewed, addressed, and noted the following in the patient's chart:  A. Medical and social history B. Use of alcohol, tobacco or illicit drugs  C. Current medications and supplements D. Functional ability and status E.  Nutritional status F.  Physical activity G. Advance directives H. List of other physicians I.  Hospitalizations, surgeries, and ER visits in previous 12 months J.  [place] to include hearing, vision, cognitive, depression L. Referrals and appointments - none  In addition, I have reviewed and discussed with patient certain preventive protocols, quality metrics, and best practice recommendations. A written personalized care plan for preventive services as well as general preventive health recommendations  were provided to patient.  See attached scanned questionnaire for additional information.    Signed,   Lindell Noe, MHA, BS, LPN Health Coach

## 2017-12-26 NOTE — Progress Notes (Signed)
I reviewed health advisor's note, was available for consultation, and agree with documentation and plan.   Signed,  Rick Carruthers T. Shawntina Diffee, MD  

## 2017-12-26 NOTE — Progress Notes (Signed)
PCP notes:   Health maintenance:  No gaps identified.  Abnormal screenings:   Hearing - failed  Hearing Screening   125Hz  250Hz  500Hz  1000Hz  2000Hz  3000Hz  4000Hz  6000Hz  8000Hz   Right ear:   40 40 40  0    Left ear:   40 40 40  0     Patient concerns:   None  Nurse concerns:  None  Next PCP appt:   01/04/18 @ 1500

## 2017-12-28 ENCOUNTER — Ambulatory Visit: Payer: Medicare Other | Admitting: Family Medicine

## 2018-01-04 ENCOUNTER — Encounter: Payer: Self-pay | Admitting: Family Medicine

## 2018-01-04 ENCOUNTER — Ambulatory Visit (INDEPENDENT_AMBULATORY_CARE_PROVIDER_SITE_OTHER): Payer: Medicare Other | Admitting: Family Medicine

## 2018-01-04 VITALS — BP 134/78 | HR 57 | Temp 97.6°F | Ht 70.0 in | Wt 236.0 lb

## 2018-01-04 DIAGNOSIS — N3281 Overactive bladder: Secondary | ICD-10-CM | POA: Diagnosis not present

## 2018-01-04 DIAGNOSIS — R269 Unspecified abnormalities of gait and mobility: Secondary | ICD-10-CM

## 2018-01-04 DIAGNOSIS — E66811 Obesity, class 1: Secondary | ICD-10-CM

## 2018-01-04 DIAGNOSIS — E669 Obesity, unspecified: Secondary | ICD-10-CM

## 2018-01-04 DIAGNOSIS — R7303 Prediabetes: Secondary | ICD-10-CM

## 2018-01-04 DIAGNOSIS — E785 Hyperlipidemia, unspecified: Secondary | ICD-10-CM | POA: Diagnosis not present

## 2018-01-04 DIAGNOSIS — K219 Gastro-esophageal reflux disease without esophagitis: Secondary | ICD-10-CM | POA: Diagnosis not present

## 2018-01-04 MED ORDER — SIMVASTATIN 40 MG PO TABS: ORAL_TABLET | ORAL | 3 refills | Status: DC

## 2018-01-04 MED ORDER — FISH OIL 1200 MG PO CAPS: 1.0000 | ORAL_CAPSULE | Freq: Every day | ORAL | Status: DC

## 2018-01-04 MED ORDER — FENOFIBRATE 160 MG PO TABS: 160.0000 mg | ORAL_TABLET | Freq: Every day | ORAL | 3 refills | Status: DC

## 2018-01-04 NOTE — Progress Notes (Signed)
BP 134/78 (BP Location: Left Arm, Patient Position: Sitting, Cuff Size: Large)   Pulse (!) 57   Temp 97.6 F (36.4 C) (Oral)   Ht 5\' 10"  (1.778 m)   Wt 236 lb (107 kg)   SpO2 95%   BMI 33.86 kg/m    CC: AMW f/u visit Subjective:    Patient ID: Joshua Everett, male    DOB: 03-10-1935, 83 y.o.   MRN: [id number]  HPI: Joshua Everett is a 83 y.o. male presenting on 01/04/2018 for Annual Exam (Pt 2. Wants to resume Flomax.)   Saw Katha Cabal last week for medicare wellness visit. Note reviewed.    Urinary urgency with nocturia x3-4/night followed by Dr Diona Fanti - thought overactive bladder/detrusor instability not responding to medications (desmopressin, myrbetriq). Started on PTNS 05/2017 - he hasn't seen much effect. Ongoing struggle with nocturia and urinary incontinence. Discussed next step is return to urology.   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 Wife notices resting tremor of right hand and worsening balance as well as slowed gait (ie no longer able to walk on beach). No trouble noted with memory. Pt endorses worsening stiffness. Wife would like referral to Dr Manuella Ghazi. He stopped playing golf 6 months ago - previously enjoyed this a lot. Left hand dominant.   Preventative: COLONOSCOPY Date: 2010 TAx1, rpt 5 yrs ([facility] in [facility]) complicated by marked bradycardia s/p ER evaluation. iFOB negative 01/2017. Prostate cancer screening - aged out Declines flu shot. prevnar 03/2014, pneumovax 2009.  Tdap 2015 zostavax 04/2013 shingrix - discussed  Advanced directives - scanned 01/2016. No life prolonging measures if terminal irreversible condition. No HCPOA named.  Seat belt use discussed.  Sunscreen use discussed. No changing moles on skin. Sees dermatology (Dr Nicole Kindred) Ex smoker quit 1970  Alcohol - seldom Dentist - Q6 mo Eye exam - yearly (glaucoma)  Lives with wife Occ: Nature conservation officer, retired Edu: MBA [employer] Activity: stopped golfing 6 months ago. Trouble with  balance, was no longer fun.  Diet: good water, fruits/vegetables daily   Relevant past medical, surgical, family and social history reviewed and updated as indicated. Interim medical history since our last visit reviewed. Allergies and medications reviewed and updated. Outpatient Medications Prior to Visit  Medication Sig Dispense Refill  . B Complex Vitamins (VITAMIN B COMPLEX PO) Take 1 tablet by mouth daily.    . bimatoprost (LUMIGAN) 0.01 % SOLN Place 1 drop into both eyes at bedtime.     . cholecalciferol (VITAMIN D) 1000 units tablet Take 1,000 Units by mouth daily.     . cycloSPORINE (RESTASIS) 0.05 % ophthalmic emulsion Place 1 drop into both eyes 2 (two) times daily.    [name] Kitchen desmopressin (DDAVP) 0.2 MG tablet Take 0.6 mg by mouth at bedtime.     [name] Kitchen linaclotide (LINZESS) 290 MCG CAPS capsule Take 1 capsule (290 mcg total) by mouth daily before breakfast. 90 capsule 2  . mirabegron ER (MYRBETRIQ) 25 MG TB24 tablet Take 25 mg by mouth daily.    . Multiple Vitamins-Minerals (MULTIVITAMIN ADULT PO) Take 1 tablet by mouth daily.    . NON FORMULARY Take 2 drops by mouth daily. TINCTURE OF GARLIC    . pantoprazole (PROTONIX) 40 MG tablet TAKE 1 TABLET DAILY 90 tablet 3  . ranitidine (ZANTAC) 150 MG capsule Take 150 mg by mouth daily as needed.     [name] Kitchen aspirin EC 81 MG tablet Take 1 tablet (81 mg total) by mouth once a week.    [name] Kitchen  fenofibrate 160 MG tablet TAKE 1 TABLET DAILY 90 tablet 0  . omega-3 acid ethyl esters (LOVAZA) 1 g capsule Take 1 g by mouth daily.    . simvastatin (ZOCOR) 40 MG tablet TAKE 1 TABLET DAILY AT 6 P.M. 90 tablet 3  . mirabegron ER (MYRBETRIQ) 50 MG TB24 tablet Take 50 mg by mouth at bedtime.     No facility-administered medications prior to visit.      Per HPI unless specifically indicated in ROS section below Review of Systems     Objective:    BP 134/78 (BP Location: Left Arm, Patient Position: Sitting, Cuff Size: Large)   Pulse (!) 57   Temp 97.6 F (36.4 C)  (Oral)   Ht 5\' 10"  (1.778 m)   Wt 236 lb (107 kg)   SpO2 95%   BMI 33.86 kg/m   Wt Readings from Last 3 Encounters:  01/04/18 236 lb (107 kg)  12/26/17 233 lb 4 oz (105.8 kg)  01/12/17 234 lb (106.1 kg)    Physical Exam  Constitutional: He is oriented to person, place, and time. He appears well-developed and well-nourished. No distress.  HENT:  Head: Normocephalic and atraumatic.  Right Ear: Hearing, tympanic membrane, external ear and ear canal normal.  Left Ear: Hearing, tympanic membrane, external ear and ear canal normal.  Nose: Nose normal.  Mouth/Throat: Uvula is midline, oropharynx is clear and moist and mucous membranes are normal. No oropharyngeal exudate, posterior oropharyngeal edema or posterior oropharyngeal erythema.  Eyes: Pupils are equal, round, and reactive to light. Conjunctivae and EOM are normal. No scleral icterus.  Neck: Normal range of motion. Neck supple. Carotid bruit is not present. No thyromegaly present.  Cardiovascular: Normal rate, regular rhythm, normal heart sounds and intact distal pulses.  No murmur heard. Pulses:      Radial pulses are 2+ on the right side, and 2+ on the left side.  Pulmonary/Chest: Effort normal and breath sounds normal. No respiratory distress. He has no wheezes. He has no rales.  Abdominal: Soft. Bowel sounds are normal. He exhibits no distension and no mass. There is no tenderness. There is no rebound and no guarding.  Musculoskeletal: Normal range of motion. He exhibits no edema.  Lymphadenopathy:    He has no cervical adenopathy.  Neurological: He is alert and oriented to person, place, and time.  CN grossly intact No resting or postural action tremor appreciated today Cogwheel rigidity bilateral UE Slowed shuffling gait FTN intact  Skin: Skin is warm and dry. No rash noted.  Psychiatric: He has a normal mood and affect. His behavior is normal. Judgment and thought content normal.  Nursing note and vitals  reviewed.  Results for orders placed or performed in visit on 12/26/17  Hemoglobin A1c  Result Value Ref Range   Hgb A1c MFr Bld 5.8 4.6 - 6.5 %  Comprehensive metabolic panel  Result Value Ref Range   Sodium 139 135 - 145 mEq/L   Potassium 4.3 3.5 - 5.1 mEq/L   Chloride 103 96 - 112 mEq/L   CO2 32 19 - 32 mEq/L   Glucose, Bld 111 (H) 70 - 99 mg/dL   BUN 15 6 - 23 mg/dL   Creatinine, Ser 1.04 0.40 - 1.50 mg/dL   Total Bilirubin 0.7 0.2 - 1.2 mg/dL   Alkaline Phosphatase 36 (L) 39 - 117 U/L   AST 17 0 - 37 U/L   ALT 16 0 - 53 U/L   Total Protein 6.7 6.0 - 8.3  g/dL   Albumin 4.0 3.5 - 5.2 g/dL   Calcium 9.8 8.4 - 10.5 mg/dL   GFR 72.78 >60.00 mL/min  Lipid panel  Result Value Ref Range   Cholesterol 122 0 - 200 mg/dL   Triglycerides 92.0 0.0 - 149.0 mg/dL   HDL 41.90 >39.00 mg/dL   VLDL 18.4 0.0 - 40.0 mg/dL   LDL Cholesterol 62 0 - 99 mg/dL   Total CHOL/HDL Ratio 3    NonHDL 80.50       Assessment & Plan:   Problem List Items Addressed This Visit    Prediabetes    Chronic, encouraged avoiding added sugars in diet.       Overactive bladder    Ongoing nocturia and polyuria and urge incontinence. Appreciate urology care - recommended f/u with uro if PTNS not effective.       Obesity, Class I, BMI 30-34.9    Encouraged healthy diet and lifestyle choices.       GERD (gastroesophageal reflux disease)    Managed with zantac PRN.       Dyslipidemia    Continue statin, fibrate, fish oil.  The ASCVD Risk score Mikey Bussing DC Jr., et al., 2013) failed to calculate for the following reasons:   The 2013 ASCVD risk score is only valid for ages 61 to 37       Relevant Medications   simvastatin (ZOCOR) 40 MG tablet   fenofibrate 160 MG tablet   Abnormal gait - Primary    Pt/wife endorse anosmia, resting tremor R hand (L hand dominant), slowed gait. On exam some cogwheel rigidity. Reasonable to refer to neurology for parkinson's evaluation. Wife requests Dr Manuella Ghazi who she sees.        Relevant Orders   Ambulatory referral to Neurology       Meds ordered this encounter  Medications  . simvastatin (ZOCOR) 40 MG tablet    Sig: TAKE 1 TABLET DAILY AT 6 P.M.    Dispense:  90 tablet    Refill:  3  . fenofibrate 160 MG tablet    Sig: Take 1 tablet (160 mg total) by mouth daily.    Dispense:  90 tablet    Refill:  3  . Omega-3 Fatty Acids (FISH OIL) 1200 MG CAPS    Sig: Take 1 capsule (1,200 mg total) by mouth daily.   Orders Placed This Encounter  Procedures  . Ambulatory referral to Neurology    Referral Priority:   Routine    Referral Type:   Consultation    Referral Reason:   Specialty Services Required    Requested Specialty:   Neurology    Number of Visits Requested:   1    Follow up plan: Return in about 1 year (around 01/05/2019) for medicare wellness visit, follow up visit.  Ria Bush, MD

## 2018-01-04 NOTE — Assessment & Plan Note (Signed)
Chronic, encouraged avoiding added sugars in diet.

## 2018-01-04 NOTE — Patient Instructions (Addendum)
We will refer you to Dr Manuella Ghazi for neurological evaluation.  If interested, check with pharmacy about new 2 shot shingles series (shingrix).  Schedule follow up with Dr Diona Fanti. Good to see you today, call us with questions. Return as needed or in 1 year for next wellness visit and follow up.  Health Maintenance, Male A healthy lifestyle and preventive care is important for your health and wellness. Ask your health care provider about what schedule of regular examinations is right for you. What should I know about weight and diet? Eat a Healthy Diet  Eat plenty of vegetables, fruits, whole grains, low-fat dairy products, and lean protein.  Do not eat a lot of foods high in solid fats, added sugars, or salt.  Maintain a Healthy Weight Regular exercise can help you achieve or maintain a healthy weight. You should:  Do at least 150 minutes of exercise each week. The exercise should increase your heart rate and make you sweat (moderate-intensity exercise).  Do strength-training exercises at least twice a week.  Watch Your Levels of Cholesterol and Blood Lipids  Have your blood tested for lipids and cholesterol every 5 years starting at 81 years of age. If you are at high risk for heart disease, you should start having your blood tested when you are 81 years old. You may need to have your cholesterol levels checked more often if: ? Your lipid or cholesterol levels are high. ? You are older than 81 years of age. ? You are at high risk for heart disease.  What should I know about cancer screening? Many types of cancers can be detected early and may often be prevented. Lung Cancer  You should be screened every year for lung cancer if: ? You are a current smoker who has smoked for at least 30 years. ? You are a former smoker who has quit within the past 15 years.  Talk to your health care provider about your screening options, when you should start screening, and how often you should be  screened.  Colorectal Cancer  Routine colorectal cancer screening usually begins at 81 years of age and should be repeated every 5-10 years until you are 81 years old. You may need to be screened more often if early forms of precancerous polyps or small growths are found. Your health care provider may recommend screening at an earlier age if you have risk factors for colon cancer.  Your health care provider may recommend using home test kits to check for hidden blood in the stool.  A small camera at the end of a tube can be used to examine your colon (sigmoidoscopy or colonoscopy). This checks for the earliest forms of colorectal cancer.  Prostate and Testicular Cancer  Depending on your age and overall health, your health care provider may do certain tests to screen for prostate and testicular cancer.  Talk to your health care provider about any symptoms or concerns you have about testicular or prostate cancer.  Skin Cancer  Check your skin from head to toe regularly.  Tell your health care provider about any new moles or changes in moles, especially if: ? There is a change in a mole's size, shape, or color. ? You have a mole that is larger than a pencil eraser.  Always use sunscreen. Apply sunscreen liberally and repeat throughout the day.  Protect yourself by wearing long sleeves, pants, a wide-brimmed hat, and sunglasses when outside.  What should I know about heart disease, diabetes, and  high blood pressure?  If you are 72-64 years of age, have your blood pressure checked every 3-5 years. If you are 57 years of age or older, have your blood pressure checked every year. You should have your blood pressure measured twice-once when you are at a hospital or clinic, and once when you are not at a hospital or clinic. Record the average of the two measurements. To check your blood pressure when you are not at a hospital or clinic, you can use: ? An automated blood pressure machine at a  pharmacy. ? A home blood pressure monitor.  Talk to your health care provider about your target blood pressure.  If you are between 69-27 years old, ask your health care provider if you should take aspirin to prevent heart disease.  Have regular diabetes screenings by checking your fasting blood sugar level. ? If you are at a normal weight and have a low risk for diabetes, have this test once every three years after the age of 51. ? If you are overweight and have a high risk for diabetes, consider being tested at a younger age or more often.  A one-time screening for abdominal aortic aneurysm (AAA) by ultrasound is recommended for men aged 10-75 years who are current or former smokers. What should I know about preventing infection? Hepatitis B If you have a higher risk for hepatitis B, you should be screened for this virus. Talk with your health care provider to find out if you are at risk for hepatitis B infection. Hepatitis C Blood testing is recommended for:  Everyone born from [birth year] through 1963.  Anyone with known risk factors for hepatitis C.  Sexually Transmitted Diseases (STDs)  You should be screened each year for STDs including gonorrhea and chlamydia if: ? You are sexually active and are younger than 81 years of age. ? You are older than 81 years of age and your health care provider tells you that you are at risk for this type of infection. ? Your sexual activity has changed since you were last screened and you are at an increased risk for chlamydia or gonorrhea. Ask your health care provider if you are at risk.  Talk with your health care provider about whether you are at high risk of being infected with HIV. Your health care provider may recommend a prescription medicine to help prevent HIV infection.  What else can I do?  Schedule regular health, dental, and eye exams.  Stay current with your vaccines (immunizations).  Do not use any tobacco products, such as  cigarettes, chewing tobacco, and e-cigarettes. If you need help quitting, ask your health care provider.  Limit alcohol intake to no more than 2 drinks per day. One drink equals 12 ounces of beer, 5 ounces of wine, or 1 ounces of hard liquor.  Do not use street drugs.  Do not share needles.  Ask your health care provider for help if you need support or information about quitting drugs.  Tell your health care provider if you often feel depressed.  Tell your health care provider if you have ever been abused or do not feel safe at home. This information is not intended to replace advice given to you by your health care provider. Make sure you discuss any questions you have with your health care provider. Document Released: 11/18/2007 Document Revised: 01/19/2016 Document Reviewed: 02/23/2015 Elsevier Interactive Patient Education  [employer].

## 2018-01-04 NOTE — Assessment & Plan Note (Signed)
Pt/wife endorse anosmia, resting tremor R hand (L hand dominant), slowed gait. On exam some cogwheel rigidity. Reasonable to refer to neurology for parkinson's evaluation. Wife requests Dr Manuella Ghazi who she sees.

## 2018-01-04 NOTE — Assessment & Plan Note (Addendum)
Ongoing nocturia and polyuria and urge incontinence. Appreciate urology care - recommended f/u with uro if PTNS not effective.

## 2018-01-04 NOTE — Assessment & Plan Note (Signed)
Continue statin, fibrate, fish oil.  The ASCVD Risk score Joshua Bussing DC Jr., et al., 2013) failed to calculate for the following reasons:   The 2013 ASCVD risk score is only valid for ages 64 to 33

## 2018-01-04 NOTE — Assessment & Plan Note (Signed)
Managed with zantac PRN.

## 2018-01-04 NOTE — Assessment & Plan Note (Signed)
Encouraged healthy diet and lifestyle choices. 

## 2018-01-22 DIAGNOSIS — N3941 Urge incontinence: Secondary | ICD-10-CM | POA: Diagnosis not present

## 2018-02-12 DIAGNOSIS — H04123 Dry eye syndrome of bilateral lacrimal glands: Secondary | ICD-10-CM | POA: Diagnosis not present

## 2018-02-12 DIAGNOSIS — H524 Presbyopia: Secondary | ICD-10-CM | POA: Diagnosis not present

## 2018-02-12 DIAGNOSIS — H25813 Combined forms of age-related cataract, bilateral: Secondary | ICD-10-CM | POA: Diagnosis not present

## 2018-02-12 DIAGNOSIS — H4010X1 Unspecified open-angle glaucoma, mild stage: Secondary | ICD-10-CM | POA: Diagnosis not present

## 2018-02-19 DIAGNOSIS — N3941 Urge incontinence: Secondary | ICD-10-CM | POA: Diagnosis not present

## 2018-03-08 DIAGNOSIS — G20 Parkinson's disease: Secondary | ICD-10-CM | POA: Diagnosis not present

## 2018-03-08 DIAGNOSIS — E538 Deficiency of other specified B group vitamins: Secondary | ICD-10-CM | POA: Diagnosis not present

## 2018-03-08 DIAGNOSIS — R2689 Other abnormalities of gait and mobility: Secondary | ICD-10-CM | POA: Diagnosis not present

## 2018-03-08 DIAGNOSIS — R251 Tremor, unspecified: Secondary | ICD-10-CM | POA: Diagnosis not present

## 2018-03-08 DIAGNOSIS — E559 Vitamin D deficiency, unspecified: Secondary | ICD-10-CM | POA: Diagnosis not present

## 2018-03-12 DIAGNOSIS — L820 Inflamed seborrheic keratosis: Secondary | ICD-10-CM | POA: Diagnosis not present

## 2018-03-12 DIAGNOSIS — D229 Melanocytic nevi, unspecified: Secondary | ICD-10-CM | POA: Diagnosis not present

## 2018-03-12 DIAGNOSIS — L821 Other seborrheic keratosis: Secondary | ICD-10-CM | POA: Diagnosis not present

## 2018-03-12 DIAGNOSIS — L570 Actinic keratosis: Secondary | ICD-10-CM | POA: Diagnosis not present

## 2018-03-12 DIAGNOSIS — D0461 Carcinoma in situ of skin of right upper limb, including shoulder: Secondary | ICD-10-CM | POA: Diagnosis not present

## 2018-03-12 DIAGNOSIS — Z85828 Personal history of other malignant neoplasm of skin: Secondary | ICD-10-CM | POA: Diagnosis not present

## 2018-03-12 DIAGNOSIS — L578 Other skin changes due to chronic exposure to nonionizing radiation: Secondary | ICD-10-CM | POA: Diagnosis not present

## 2018-03-12 DIAGNOSIS — D225 Melanocytic nevi of trunk: Secondary | ICD-10-CM | POA: Diagnosis not present

## 2018-03-12 DIAGNOSIS — D485 Neoplasm of uncertain behavior of skin: Secondary | ICD-10-CM | POA: Diagnosis not present

## 2018-03-12 DIAGNOSIS — Z1283 Encounter for screening for malignant neoplasm of skin: Secondary | ICD-10-CM | POA: Diagnosis not present

## 2018-03-12 DIAGNOSIS — G20 Parkinson's disease: Secondary | ICD-10-CM | POA: Insufficient documentation

## 2018-03-12 DIAGNOSIS — D1800 Hemangioma unspecified site: Secondary | ICD-10-CM | POA: Diagnosis not present

## 2018-03-14 DIAGNOSIS — N3941 Urge incontinence: Secondary | ICD-10-CM | POA: Diagnosis not present

## 2018-03-20 ENCOUNTER — Other Ambulatory Visit: Payer: Self-pay

## 2018-03-20 MED ORDER — LINACLOTIDE 290 MCG PO CAPS
290.0000 ug | ORAL_CAPSULE | Freq: Every day | ORAL | 3 refills | Status: DC
Start: 2018-03-20 — End: 2018-12-03

## 2018-03-25 IMAGING — DX DG KNEE COMPLETE 4+V*R*
4 series · 4 of 4 positions shown · non-contrast
Comparison: None.

CLINICAL DATA: 82-year-old male chronic worsening knee pain.

EXAM:
RIGHT KNEE - COMPLETE 4+ VIEW; RIGHT KNEE - 1-2 VIEW

[knee ap]
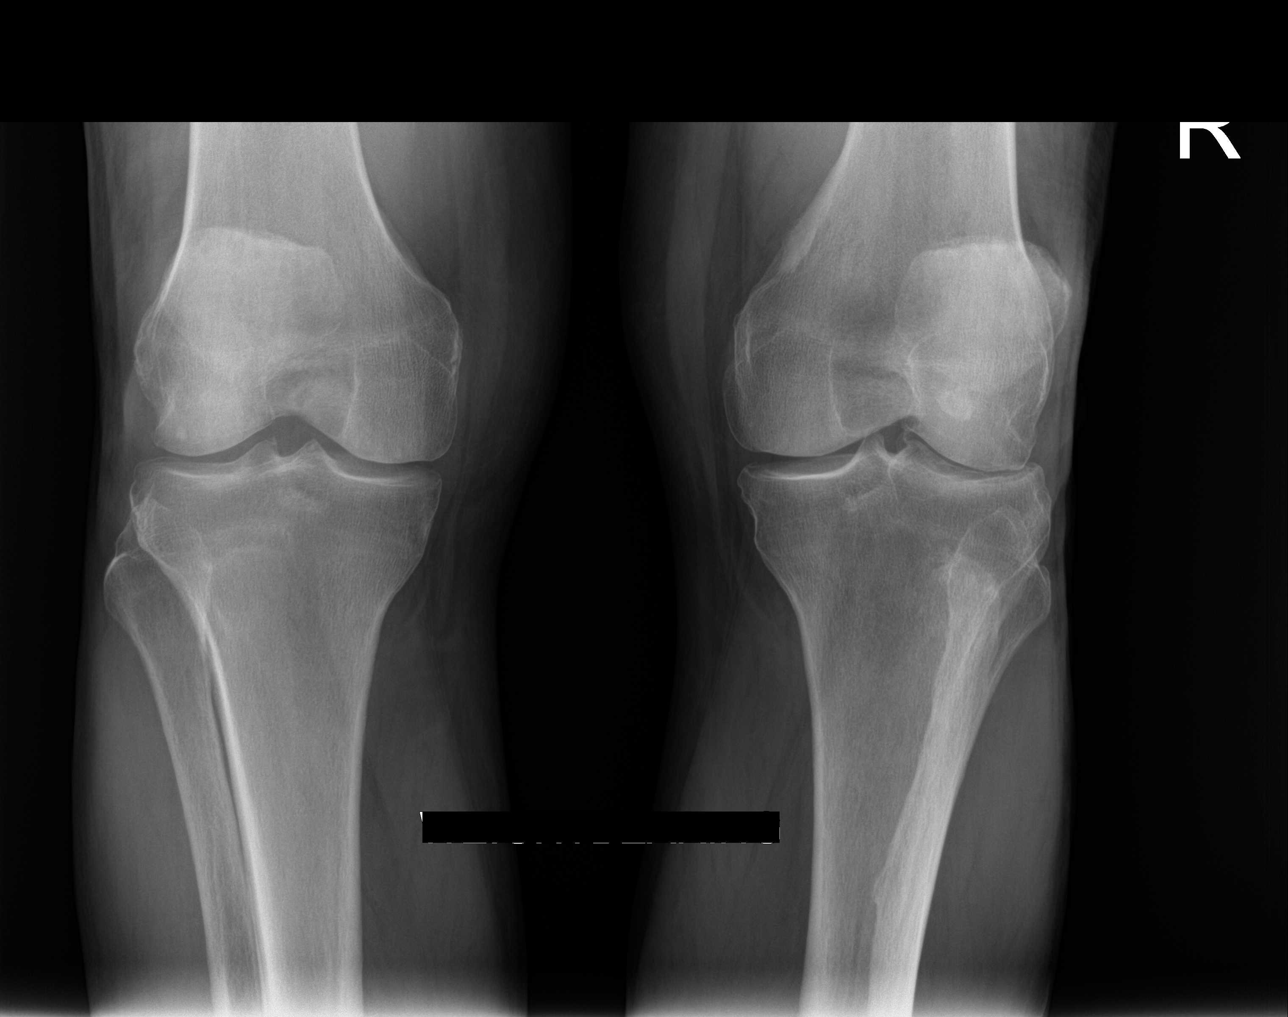

[knee lat]
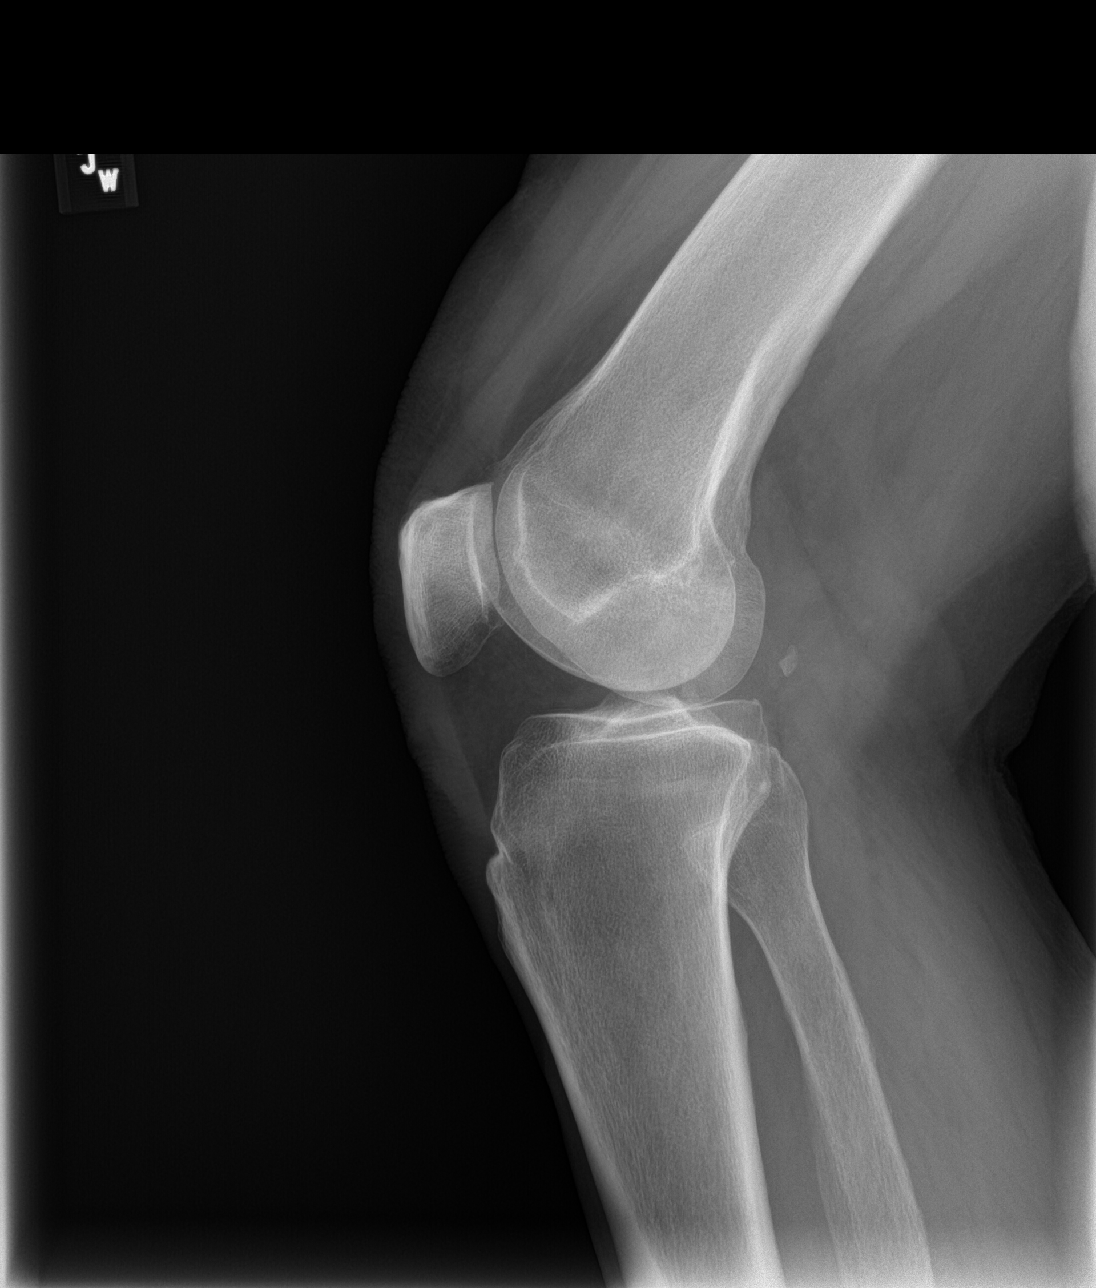

[patella skyline]
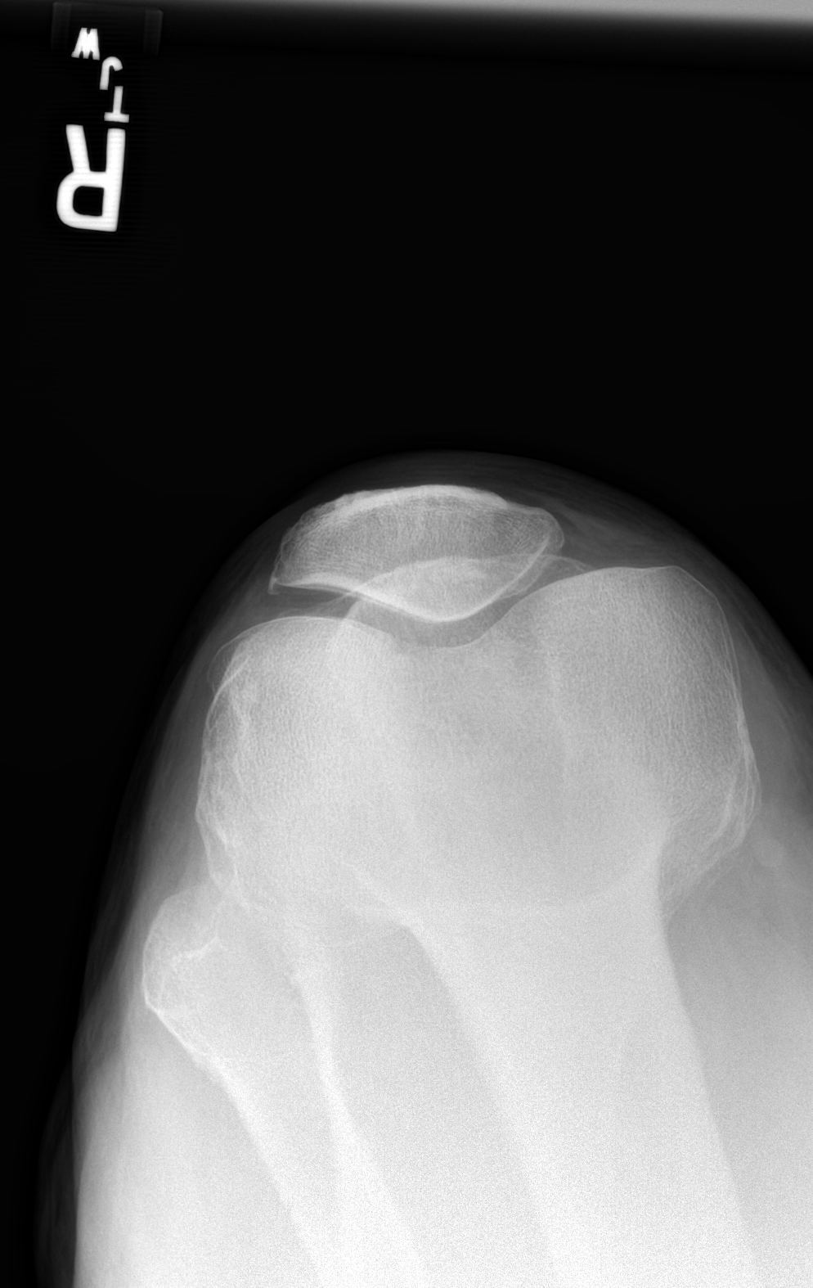

[knee obl]
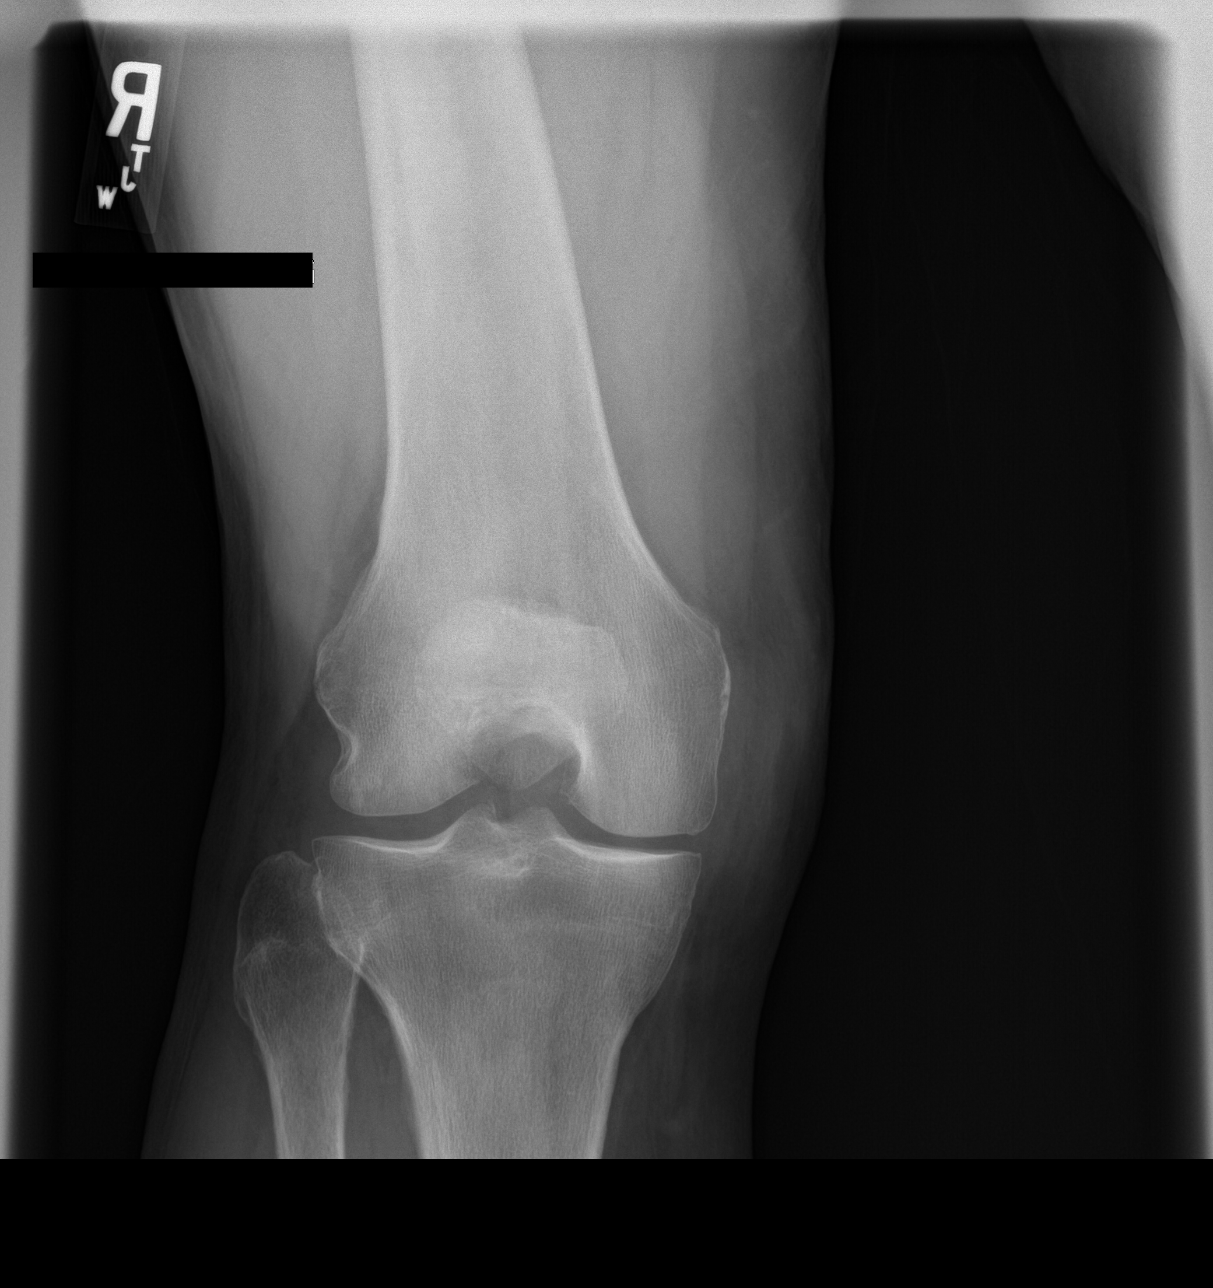

[4 of 4 positions shown; findings below may reference images not displayed]

FINDINGS: Initially, AP two-view was mislabeled. Patient returned to confirm
proper laterality.

On the frontal view which includes the left knee, moderate left
lateral tibiofemoral joint space narrowing.

Right knee:

Minimal patellofemoral joint degenerative changes. No significant
right medial or lateral tibiofemoral joint space narrowing.

No joint effusion or soft tissue abnormality.

No fracture or dislocation.
IMPRESSION: Minimal right patellofemoral joint space narrowing.

Moderate left lateral tibiofemoral joint space narrowing.

## 2018-03-26 IMAGING — DX DG KNEE 1-2V*R*
2 series · 2 of 2 positions shown · non-contrast
Comparison: None.

CLINICAL DATA: 82-year-old male chronic worsening knee pain.

EXAM:
RIGHT KNEE - COMPLETE 4+ VIEW; RIGHT KNEE - 1-2 VIEW

[knee ap]
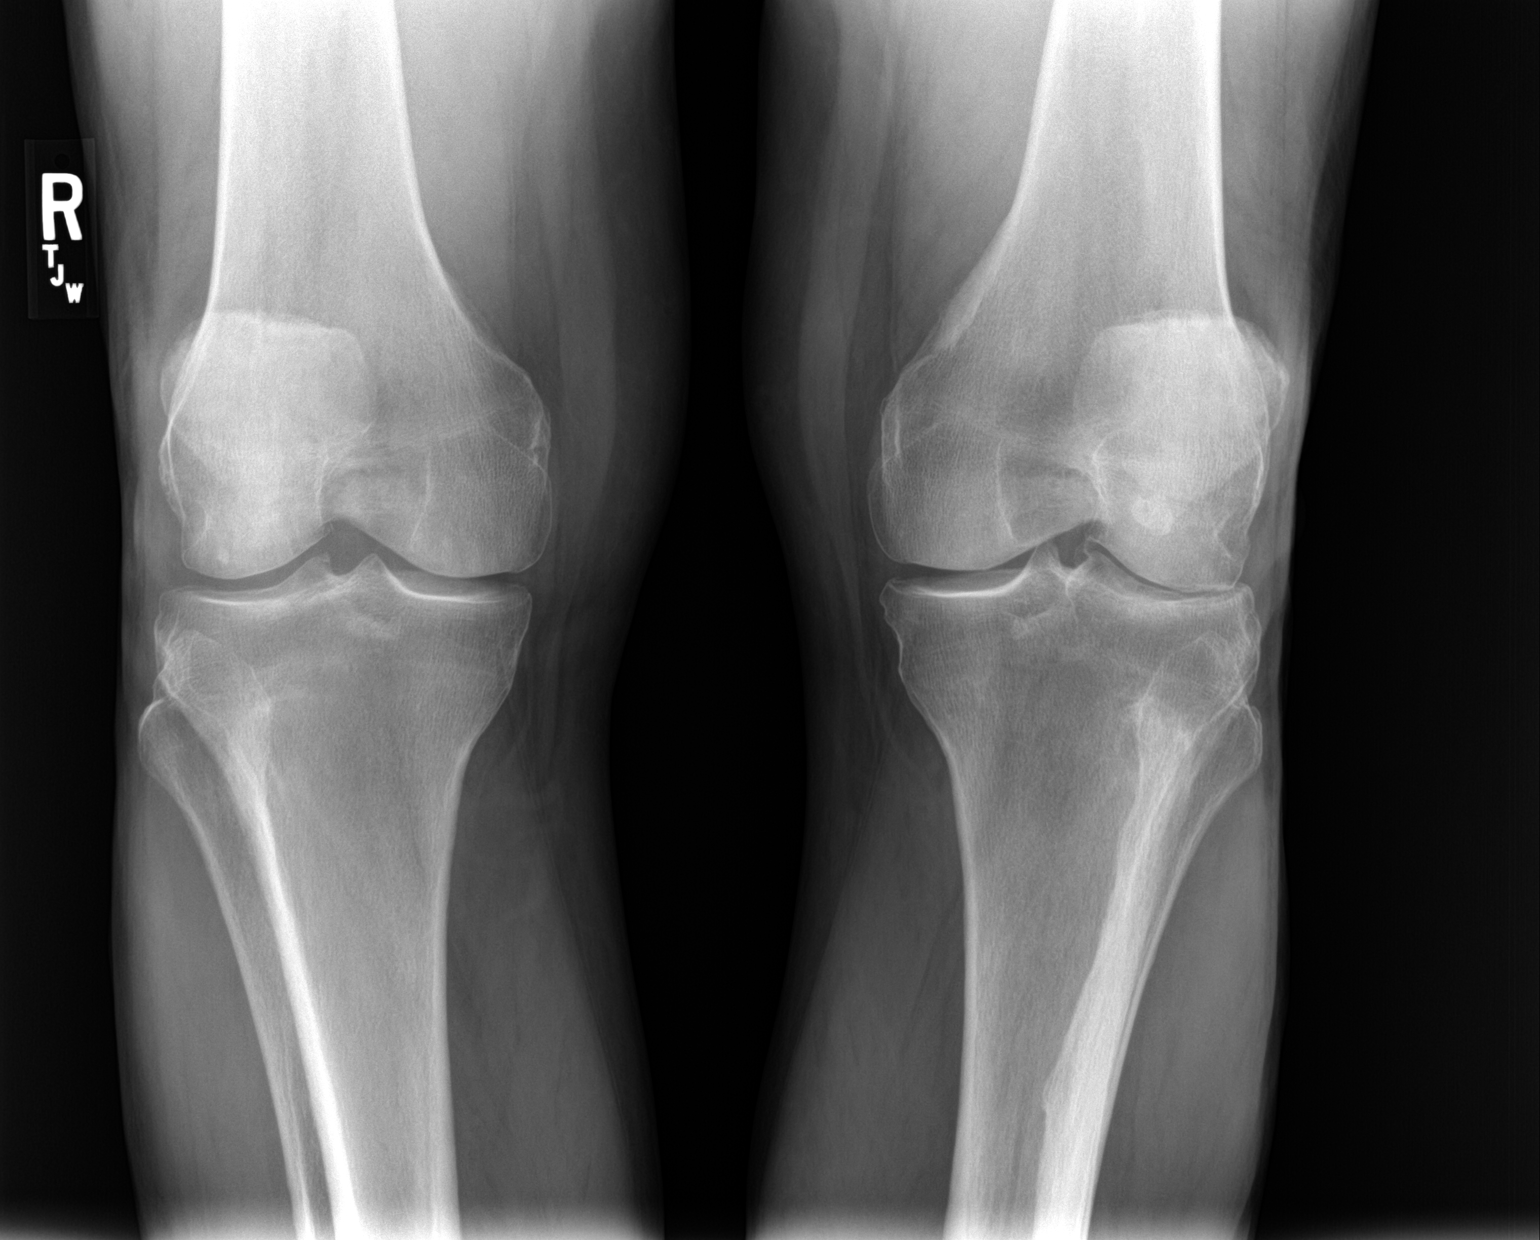

[knee lat]
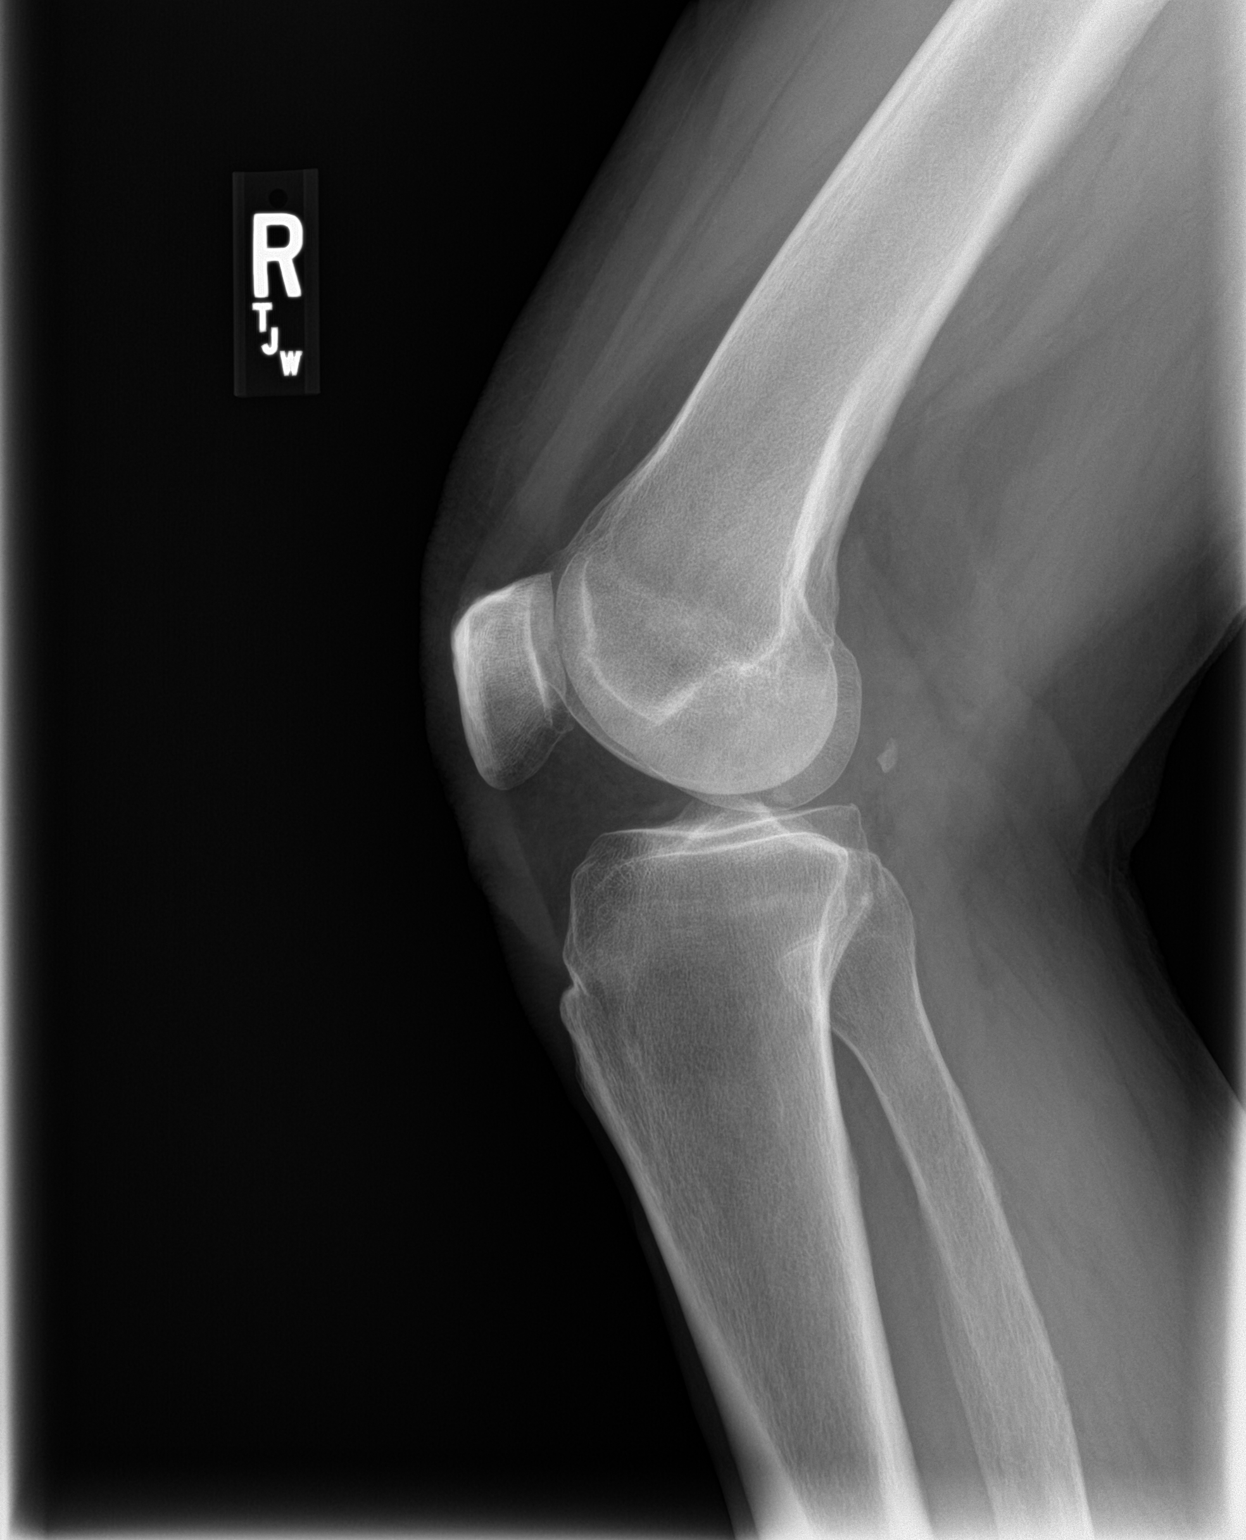

[2 of 2 positions shown; findings below may reference images not displayed]

FINDINGS: Initially, AP two-view was mislabeled. Patient returned to confirm
proper laterality.

On the frontal view which includes the left knee, moderate left
lateral tibiofemoral joint space narrowing.

Right knee:

Minimal patellofemoral joint degenerative changes. No significant
right medial or lateral tibiofemoral joint space narrowing.

No joint effusion or soft tissue abnormality.

No fracture or dislocation.
IMPRESSION: Minimal right patellofemoral joint space narrowing.

Moderate left lateral tibiofemoral joint space narrowing.

## 2018-03-27 ENCOUNTER — Other Ambulatory Visit: Payer: Self-pay | Admitting: Neurology

## 2018-03-27 DIAGNOSIS — R270 Ataxia, unspecified: Secondary | ICD-10-CM

## 2018-04-04 DIAGNOSIS — N3941 Urge incontinence: Secondary | ICD-10-CM | POA: Diagnosis not present

## 2018-04-15 ENCOUNTER — Ambulatory Visit
Admission: RE | Admit: 2018-04-15 | Discharge: 2018-04-15 | Disposition: A | Discharge status: Home / Self Care | Payer: Medicare Other | Source: Ambulatory Visit | Attending: Neurology | Admitting: Neurology

## 2018-04-15 DIAGNOSIS — I6782 Cerebral ischemia: Secondary | ICD-10-CM | POA: Insufficient documentation

## 2018-04-15 DIAGNOSIS — R270 Ataxia, unspecified: Secondary | ICD-10-CM

## 2018-04-16 DIAGNOSIS — H401131 Primary open-angle glaucoma, bilateral, mild stage: Secondary | ICD-10-CM | POA: Diagnosis not present

## 2018-04-22 DIAGNOSIS — D0461 Carcinoma in situ of skin of right upper limb, including shoulder: Secondary | ICD-10-CM | POA: Diagnosis not present

## 2018-04-22 DIAGNOSIS — L578 Other skin changes due to chronic exposure to nonionizing radiation: Secondary | ICD-10-CM | POA: Diagnosis not present

## 2018-04-22 DIAGNOSIS — C44729 Squamous cell carcinoma of skin of left lower limb, including hip: Secondary | ICD-10-CM | POA: Diagnosis not present

## 2018-04-25 DIAGNOSIS — N3941 Urge incontinence: Secondary | ICD-10-CM | POA: Diagnosis not present

## 2018-05-06 DIAGNOSIS — N401 Enlarged prostate with lower urinary tract symptoms: Secondary | ICD-10-CM | POA: Diagnosis not present

## 2018-05-06 DIAGNOSIS — R351 Nocturia: Secondary | ICD-10-CM | POA: Diagnosis not present

## 2018-05-16 DIAGNOSIS — N3941 Urge incontinence: Secondary | ICD-10-CM | POA: Diagnosis not present

## 2018-06-06 DIAGNOSIS — N3941 Urge incontinence: Secondary | ICD-10-CM | POA: Diagnosis not present

## 2018-06-21 ENCOUNTER — Other Ambulatory Visit: Payer: Self-pay

## 2018-06-27 DIAGNOSIS — N3941 Urge incontinence: Secondary | ICD-10-CM | POA: Diagnosis not present

## 2018-07-09 DIAGNOSIS — G20 Parkinson's disease: Secondary | ICD-10-CM | POA: Diagnosis not present

## 2018-07-25 DIAGNOSIS — N3941 Urge incontinence: Secondary | ICD-10-CM | POA: Diagnosis not present

## 2018-07-29 ENCOUNTER — Other Ambulatory Visit: Payer: Self-pay | Admitting: Family Medicine

## 2018-07-29 MED ORDER — FENOFIBRATE 160 MG PO TABS: 160.0000 mg | ORAL_TABLET | Freq: Every day | ORAL | 3 refills | Status: DC

## 2018-07-29 NOTE — Telephone Encounter (Signed)
Pt need refill for   Fenofibrate 160 mg   Sent to [facility]  90 day supply

## 2018-08-15 DIAGNOSIS — N3941 Urge incontinence: Secondary | ICD-10-CM | POA: Diagnosis not present

## 2018-09-05 DIAGNOSIS — N3941 Urge incontinence: Secondary | ICD-10-CM | POA: Diagnosis not present

## 2018-09-23 ENCOUNTER — Ambulatory Visit: Payer: Medicare Other | Admitting: Physical Therapy

## 2018-09-24 DIAGNOSIS — D485 Neoplasm of uncertain behavior of skin: Secondary | ICD-10-CM | POA: Diagnosis not present

## 2018-09-24 DIAGNOSIS — Z85828 Personal history of other malignant neoplasm of skin: Secondary | ICD-10-CM | POA: Diagnosis not present

## 2018-09-24 DIAGNOSIS — D0461 Carcinoma in situ of skin of right upper limb, including shoulder: Secondary | ICD-10-CM | POA: Diagnosis not present

## 2018-09-24 DIAGNOSIS — L578 Other skin changes due to chronic exposure to nonionizing radiation: Secondary | ICD-10-CM | POA: Diagnosis not present

## 2018-09-24 DIAGNOSIS — L570 Actinic keratosis: Secondary | ICD-10-CM | POA: Diagnosis not present

## 2018-09-24 DIAGNOSIS — L718 Other rosacea: Secondary | ICD-10-CM | POA: Diagnosis not present

## 2018-09-30 ENCOUNTER — Ambulatory Visit: Payer: Medicare Other | Admitting: Physical Therapy

## 2018-10-01 ENCOUNTER — Ambulatory Visit: Payer: Medicare Other | Admitting: Physical Therapy

## 2018-10-02 ENCOUNTER — Ambulatory Visit: Payer: Medicare Other | Admitting: Physical Therapy

## 2018-10-03 ENCOUNTER — Ambulatory Visit: Payer: Medicare Other | Admitting: Physical Therapy

## 2018-10-07 ENCOUNTER — Telehealth: Payer: Self-pay | Admitting: Gastroenterology

## 2018-10-07 ENCOUNTER — Other Ambulatory Visit: Payer: Self-pay

## 2018-10-07 ENCOUNTER — Ambulatory Visit: Payer: Medicare Other | Admitting: Physical Therapy

## 2018-10-07 DIAGNOSIS — K219 Gastro-esophageal reflux disease without esophagitis: Secondary | ICD-10-CM

## 2018-10-07 MED ORDER — PANTOPRAZOLE SODIUM 40 MG PO TBEC: 40.0000 mg | DELAYED_RELEASE_TABLET | Freq: Every day | ORAL | 1 refills | Status: DC

## 2018-10-07 NOTE — Telephone Encounter (Signed)
Prescription for Pantoprazole has been sent to [facility] per pt request.

## 2018-10-07 NOTE — Telephone Encounter (Signed)
Pt wife left vm to because pt rxpantoprazole (PROTONIX) 40 MG tablet needs refill

## 2018-10-08 ENCOUNTER — Ambulatory Visit: Payer: Medicare Other | Admitting: Physical Therapy

## 2018-10-09 ENCOUNTER — Ambulatory Visit: Payer: Medicare Other | Admitting: Physical Therapy

## 2018-10-09 DIAGNOSIS — G20 Parkinson's disease: Secondary | ICD-10-CM | POA: Diagnosis not present

## 2018-10-10 ENCOUNTER — Ambulatory Visit: Payer: Medicare Other | Admitting: Physical Therapy

## 2018-10-10 DIAGNOSIS — N3941 Urge incontinence: Secondary | ICD-10-CM | POA: Diagnosis not present

## 2018-10-14 ENCOUNTER — Ambulatory Visit: Payer: Medicare Other | Admitting: Physical Therapy

## 2018-10-15 ENCOUNTER — Ambulatory Visit: Payer: Medicare Other | Admitting: Physical Therapy

## 2018-10-16 ENCOUNTER — Ambulatory Visit: Payer: Medicare Other | Admitting: Physical Therapy

## 2018-10-17 ENCOUNTER — Ambulatory Visit: Payer: Medicare Other | Admitting: Physical Therapy

## 2018-10-21 ENCOUNTER — Ambulatory Visit: Payer: Medicare Other | Admitting: Physical Therapy

## 2018-10-22 ENCOUNTER — Ambulatory Visit: Payer: Medicare Other | Admitting: Physical Therapy

## 2018-10-22 DIAGNOSIS — Z85828 Personal history of other malignant neoplasm of skin: Secondary | ICD-10-CM | POA: Diagnosis not present

## 2018-10-22 DIAGNOSIS — L570 Actinic keratosis: Secondary | ICD-10-CM | POA: Diagnosis not present

## 2018-10-23 ENCOUNTER — Ambulatory Visit: Payer: Medicare Other | Admitting: Physical Therapy

## 2018-10-24 ENCOUNTER — Ambulatory Visit: Payer: Medicare Other | Admitting: Physical Therapy

## 2018-10-29 ENCOUNTER — Ambulatory Visit: Payer: Medicare Other | Admitting: Physical Therapy

## 2018-11-11 DIAGNOSIS — H4010X1 Unspecified open-angle glaucoma, mild stage: Secondary | ICD-10-CM | POA: Diagnosis not present

## 2018-11-11 DIAGNOSIS — H25813 Combined forms of age-related cataract, bilateral: Secondary | ICD-10-CM | POA: Diagnosis not present

## 2018-11-14 DIAGNOSIS — N3941 Urge incontinence: Secondary | ICD-10-CM | POA: Diagnosis not present

## 2018-12-03 ENCOUNTER — Ambulatory Visit (INDEPENDENT_AMBULATORY_CARE_PROVIDER_SITE_OTHER): Payer: Medicare Other | Admitting: Gastroenterology

## 2018-12-03 ENCOUNTER — Encounter: Payer: Self-pay | Admitting: Gastroenterology

## 2018-12-03 ENCOUNTER — Other Ambulatory Visit: Payer: Self-pay

## 2018-12-03 VITALS — BP 195/75 | HR 60 | Temp 97.7°F | Ht 70.0 in | Wt 233.8 lb

## 2018-12-03 DIAGNOSIS — K219 Gastro-esophageal reflux disease without esophagitis: Secondary | ICD-10-CM | POA: Diagnosis not present

## 2018-12-03 MED ORDER — LINACLOTIDE 290 MCG PO CAPS: 290.0000 ug | ORAL_CAPSULE | Freq: Every day | ORAL | 3 refills | Status: DC

## 2018-12-03 MED ORDER — PANTOPRAZOLE SODIUM 40 MG PO TBEC: 40.0000 mg | DELAYED_RELEASE_TABLET | Freq: Every day | ORAL | 3 refills | Status: DC

## 2018-12-03 NOTE — Progress Notes (Signed)
Gastroenterology Consultation  Referring Provider:     Ria Bush, MD Primary Care Physician:  Ria Bush, MD Primary Gastroenterologist:  Dr. Allen Norris     Reason for Consultation:    Constipation and GERD        HPI:   Joshua Everett is a 84 y.o. y/o male referred for consultation & management of constipation and GERD by Dr. Ria Bush, MD.  This patient comes in today for follow-up with me with a longstanding history of constipation.  The patient has been doing well on his incessant and his pantoprazole.  The patient has not seen me for a few years therefore he is coming in today for evaluation prior to having these medications refilled.  The patient does have a history of adenomatous colon polyps in the past with his last colonoscopy being 10 years ago.  The patient  attributes some of his constipation to his diagnosis of Parkinson's disease.  Otherwise the patient reports that he has been doing well without any dysphasia black stools bloody stools nausea or vomiting.  He also denies any unexplained weight loss.  Past Medical History:  Diagnosis Date  . BPH without obstruction/lower urinary tract symptoms 09/08/2014  . Chronic constipation 09/08/2014  . GERD (gastroesophageal reflux disease)   . Glaucoma   . History of chicken pox   . Hyperlipidemia   . Obesity, Class I, BMI 30-34.9 09/08/2014  . Prediabetes 07/31/2015  . Rosacea 07/06/2015  . Urine incontinence     Past Surgical History:  Procedure Laterality Date  . COLONOSCOPY  03/2009   1 TA, diverticulosis, rpt 5 yrs (Dr [facility] in [facility])  . ESOPHAGOGASTRODUODENOSCOPY  03/2009   small HH, gastric polyps biopsied, dilation for dysphagia ([facility])  . [place]    Prior to Admission medications   Medication Sig Start Date End Date Taking? Authorizing Provider  B Complex Vitamins (VITAMIN B COMPLEX PO) Take 1 tablet by mouth daily.   Yes [provider]   bimatoprost (LUMIGAN) 0.01 % SOLN Place 1 drop into both eyes at bedtime.    Yes [provider]  cholecalciferol (VITAMIN D) 1000 units tablet Take 1,000 Units by mouth daily.    Yes [provider]  cycloSPORINE (RESTASIS) 0.05 % ophthalmic emulsion Place 1 drop into both eyes 2 (two) times daily.   Yes [provider]  desmopressin (DDAVP) 0.2 MG tablet Take 0.6 mg by mouth at bedtime.    Yes [provider]  fenofibrate 160 MG tablet Take 1 tablet (160 mg total) by mouth daily. 07/29/18  Yes Ria Bush, MD  linaclotide [facility]) 290 MCG CAPS capsule Take 1 capsule (290 mcg total) by mouth daily before breakfast. 03/20/18  Yes Lucilla Lame, MD  mirabegron ER (MYRBETRIQ) 25 MG TB24 tablet Take 25 mg by mouth daily.   Yes [provider]  Multiple Vitamins-Minerals (MULTIVITAMIN ADULT PO) Take 1 tablet by mouth daily.   Yes [provider]  NON FORMULARY Take 2 drops by mouth daily. TINCTURE OF GARLIC   Yes [provider]  omega-3 acid ethyl esters (LOVAZA) 1 g capsule Take by mouth.   Yes [provider]  Omega-3 Fatty Acids (FISH OIL) 1200 MG CAPS Take 1 capsule (1,200 mg total) by mouth daily. 01/04/18  Yes Ria Bush, MD  pantoprazole (PROTONIX) 40 MG tablet Take 1 tablet (40 mg total) by mouth daily. **PLEASE SCHEDULE APPT** 10/07/18  Yes Lucilla Lame, MD  ranitidine (ZANTAC) 150 MG capsule Take 150 mg by mouth daily as needed.    Yes [provider]  simvastatin (ZOCOR) 40 MG tablet TAKE 1 TABLET DAILY AT 6 P.M. 01/04/18  Yes Ria Bush, MD  carbidopa-levodopa (SINEMET IR) 25-100 MG tablet Take by mouth. 03/08/18 04/07/18  [provider]    Family History  Problem Relation Age of Onset  . Diabetes Mother   . Alzheimer's disease Mother 84  . Kidney disease Father   . Cancer Father        brain tumor  . Cancer Sister        breast     Social History   Tobacco Use  . Smoking  status: Former Smoker    Quit date: 06/05/1968    Years since quitting: 50.5  . Smokeless tobacco: Never Used  Substance Use Topics  . Alcohol use: Yes    Alcohol/week: 0.0 standard drinks    Comment: minimal  . Drug use: No    Allergies as of 12/03/2018  . (No Known Allergies)    Review of Systems:    All systems reviewed and negative except where noted in HPI.   Physical Exam:  BP (!) 195/75   Pulse 60   Temp 97.7 F (36.5 C) (Oral)   Ht 5\' 10"  (1.778 m)   Wt 233 lb 12.8 oz (106.1 kg)   BMI 33.55 kg/m  No LMP for male patient. General:   Alert,  Well-developed, well-nourished, pleasant and cooperative in NAD Head:  Normocephalic and atraumatic. Eyes:  Sclera clear, no icterus.   Conjunctiva pink. Ears:  Normal auditory acuity. Rectal:  Deferred.  Msk:  Symmetrical without gross deformities.  Good, equal movement & strength bilaterally. Extremities:  No clubbing or edema.  No cyanosis. Neurologic:  Alert and oriented x3;  grossly normal neurologically. Skin:  Intact without significant lesions or rashes.  No jaundice. Lymph Nodes:  No significant cervical adenopathy. Psych:  Alert and cooperative. Normal mood and affect.  Imaging Studies: No results found.  Assessment and Plan:   Joshua Everett is a 84 y.o. y/o male a history of GERD and constipation.  The patient will have his pantoprazole and Linzess refilled.  The patient also been explained the risks of further adenomatous polyps since his last colonoscopy was 10 years ago.  The patient has been given the option of proceeding with a repeat colonoscopy since he is in good health and a neoplasm caught early could impact his survival.  The patient has been told to think about it and follow-up with my office if he decides to undergo a colonoscopy.  Otherwise the patient will follow-up as needed.  Lucilla Lame, MD. Marval Regal    Note: This dictation was prepared with Dragon dictation along with smaller phrase technology.  Any transcriptional errors that result from this process are unintentional.

## 2018-12-12 DIAGNOSIS — N3941 Urge incontinence: Secondary | ICD-10-CM | POA: Diagnosis not present

## 2019-01-08 ENCOUNTER — Other Ambulatory Visit: Payer: Self-pay | Admitting: Family Medicine

## 2019-01-08 DIAGNOSIS — E785 Hyperlipidemia, unspecified: Secondary | ICD-10-CM

## 2019-01-08 DIAGNOSIS — H401131 Primary open-angle glaucoma, bilateral, mild stage: Secondary | ICD-10-CM | POA: Diagnosis not present

## 2019-01-08 DIAGNOSIS — R7303 Prediabetes: Secondary | ICD-10-CM

## 2019-01-09 ENCOUNTER — Ambulatory Visit: Payer: Medicare Other

## 2019-01-09 ENCOUNTER — Other Ambulatory Visit: Payer: Medicare Other

## 2019-01-09 ENCOUNTER — Other Ambulatory Visit (INDEPENDENT_AMBULATORY_CARE_PROVIDER_SITE_OTHER): Payer: Medicare Other

## 2019-01-09 DIAGNOSIS — N3941 Urge incontinence: Secondary | ICD-10-CM | POA: Diagnosis not present

## 2019-01-09 DIAGNOSIS — R7303 Prediabetes: Secondary | ICD-10-CM

## 2019-01-09 DIAGNOSIS — E785 Hyperlipidemia, unspecified: Secondary | ICD-10-CM | POA: Diagnosis not present

## 2019-01-09 LAB — COMPREHENSIVE METABOLIC PANEL
ALT: 4 U/L (ref 0–53)
AST: 19 U/L (ref 0–37)
Albumin: 4.2 g/dL (ref 3.5–5.2)
Alkaline Phosphatase: 37 U/L — ABNORMAL LOW (ref 39–117)
BUN: 16 mg/dL (ref 6–23)
CO2: 32 mEq/L (ref 19–32)
Calcium: 10.2 mg/dL (ref 8.4–10.5)
Chloride: 103 mEq/L (ref 96–112)
Creatinine, Ser: 1.13 mg/dL (ref 0.40–1.50)
GFR: 62.06 mL/min (ref >60.00)
Glucose, Bld: 106 mg/dL — ABNORMAL HIGH (ref 70–99)
Potassium: 4.4 mEq/L (ref 3.5–5.1)
Sodium: 139 mEq/L (ref 135–145)
Total Bilirubin: 0.7 mg/dL (ref 0.2–1.2)
Total Protein: 6.6 g/dL (ref 6.0–8.3)

## 2019-01-09 LAB — LIPID PANEL
Cholesterol: 126 mg/dL (ref 0–200)
HDL: 40.8 mg/dL (ref >39.00)
LDL Cholesterol: 66 mg/dL (ref 0–99)
NonHDL: 85.12
Total CHOL/HDL Ratio: 3
Triglycerides: 94 mg/dL (ref 0.0–149.0)
VLDL: 18.8 mg/dL (ref 0.0–40.0)

## 2019-01-09 LAB — HEMOGLOBIN A1C: Hgb A1c MFr Bld: 5.7 % (ref 4.6–6.5)

## 2019-01-13 ENCOUNTER — Encounter: Payer: Self-pay | Admitting: Family Medicine

## 2019-01-13 ENCOUNTER — Ambulatory Visit: Payer: Medicare Other | Attending: Neurology | Admitting: Physical Therapy

## 2019-01-13 ENCOUNTER — Encounter: Payer: Self-pay | Admitting: Physical Therapy

## 2019-01-13 ENCOUNTER — Other Ambulatory Visit: Payer: Self-pay

## 2019-01-13 ENCOUNTER — Ambulatory Visit (INDEPENDENT_AMBULATORY_CARE_PROVIDER_SITE_OTHER): Payer: Medicare Other | Admitting: Family Medicine

## 2019-01-13 VITALS — BP 158/80 | HR 58 | Temp 98.2°F | Ht 70.0 in | Wt 230.4 lb

## 2019-01-13 DIAGNOSIS — M6281 Muscle weakness (generalized): Secondary | ICD-10-CM | POA: Diagnosis not present

## 2019-01-13 DIAGNOSIS — G20 Parkinson's disease: Secondary | ICD-10-CM | POA: Diagnosis not present

## 2019-01-13 DIAGNOSIS — Z0000 Encounter for general adult medical examination without abnormal findings: Secondary | ICD-10-CM | POA: Diagnosis not present

## 2019-01-13 DIAGNOSIS — N3281 Overactive bladder: Secondary | ICD-10-CM

## 2019-01-13 DIAGNOSIS — R262 Difficulty in walking, not elsewhere classified: Secondary | ICD-10-CM | POA: Diagnosis not present

## 2019-01-13 DIAGNOSIS — E669 Obesity, unspecified: Secondary | ICD-10-CM | POA: Diagnosis not present

## 2019-01-13 DIAGNOSIS — H409 Unspecified glaucoma: Secondary | ICD-10-CM

## 2019-01-13 DIAGNOSIS — R7303 Prediabetes: Secondary | ICD-10-CM

## 2019-01-13 DIAGNOSIS — E785 Hyperlipidemia, unspecified: Secondary | ICD-10-CM

## 2019-01-13 DIAGNOSIS — K5909 Other constipation: Secondary | ICD-10-CM

## 2019-01-13 DIAGNOSIS — N400 Enlarged prostate without lower urinary tract symptoms: Secondary | ICD-10-CM

## 2019-01-13 DIAGNOSIS — K219 Gastro-esophageal reflux disease without esophagitis: Secondary | ICD-10-CM

## 2019-01-13 MED ORDER — SIMVASTATIN 40 MG PO TABS: ORAL_TABLET | ORAL | 3 refills | Status: DC

## 2019-01-13 MED ORDER — FENOFIBRATE 160 MG PO TABS: 160.0000 mg | ORAL_TABLET | Freq: Every day | ORAL | 3 refills | Status: DC

## 2019-01-13 NOTE — Assessment & Plan Note (Signed)
Encourage healthy diet and lifestyle.  

## 2019-01-13 NOTE — Assessment & Plan Note (Signed)
Has been off lovaza for the past year. Continues statin and fibrate. FLP showing great control.

## 2019-01-13 NOTE — Assessment & Plan Note (Signed)
Continues seeing uro ([place]) now undergoing PTNS.

## 2019-01-13 NOTE — Assessment & Plan Note (Signed)
Sees glaucoma specialist.

## 2019-01-13 NOTE — Assessment & Plan Note (Signed)
Continue linzess 

## 2019-01-13 NOTE — Assessment & Plan Note (Signed)
Encouraged limiting added sugars in diet.  

## 2019-01-13 NOTE — Progress Notes (Addendum)
This visit was conducted in person.  BP (!) 158/80 (BP Location: Right Arm, Patient Position: Sitting, Cuff Size: Normal)   Pulse (!) 58   Temp 98.2 F (36.8 C) (Temporal)   Ht 5\' 10"  (1.778 m)   Wt 230 lb 6 oz (104.5 kg)   SpO2 96%   BMI 33.06 kg/m    Orthostatic VS for the past 24 hrs (Last 3 readings):  BP- Lying BP- Standing at 0 minutes  01/13/19 1121 - 158/80  01/13/19 1118 156/82 -   CC: AMW Subjective:    Patient ID: Joshua Everett, male    DOB: 1935-04-09, 84 y.o.   MRN: [id number]  HPI: Joshua Everett is a 84 y.o. male presenting on 01/13/2019 for Medicare Wellness   Did not see health advisor this year.   Last year established with Dr Manuella Ghazi, dx with parkinson's disease now on sinemet.  Sees GI for constipation and GERD - on linzess and pantoprazole.  Sees uro for urinary incontinence - on PTNS.    Hearing Screening   125Hz  250Hz  500Hz  1000Hz  2000Hz  3000Hz  4000Hz  6000Hz  8000Hz   Right ear:   25 40 40  0    Left ear:   25 40 40  0    Vision Screening Comments: Last eye exam, 02/2018    Office Visit from 01/13/2019 in [facility]  PHQ-2 Total Score  0      Fall Risk  01/13/2019 12/26/2017 12/20/2016 12/20/2015 11/11/2015  Falls in the past year? 0 No No No No      Preventative: COLONOSCOPY Date: 2010TAx1, rpt 5 yrs([facility] in [place])complicated by marked bradycardia s/p ER evaluation.iFOB negative 01/2017. Prostate cancer screening - aged out Declines flu shot. prevnar 03/2014, pneumovax 2009.  Tdap 2015 zostavax 04/2013 shingrix - discussed - to check with pharmacy.  Advanced directives - scanned 01/2016. No life prolonging measures if terminal irreversible condition. No HCPOA named. Seat belt use discussed.  Sunscreen use discussed. No changing moles on skin. Sees dermatology (Dr Nicole Kindred) Ex smoker quit 1970  Alcohol - seldom  Dentist - Q6 mo  Eye exam - yearly (glaucoma)   Lives with wife Occ: Nature conservation officer,  retired Edu: MBA S. Activity: stopped golfing 6 months ago. Trouble with balance, was no longer fun.  Diet: good water, fruits/vegetables daily      Relevant past medical, surgical, family and social history reviewed and updated as indicated. Interim medical history since our last visit reviewed. Allergies and medications reviewed and updated. Outpatient Medications Prior to Visit  Medication Sig Dispense Refill  . B Complex Vitamins (VITAMIN B COMPLEX PO) Take 1 tablet by mouth daily.    . bimatoprost (LUMIGAN) 0.01 % SOLN Place 1 drop into both eyes at bedtime.     . cholecalciferol (VITAMIN D) 1000 units tablet Take 1,000 Units by mouth daily.     . cycloSPORINE (RESTASIS) 0.05 % ophthalmic emulsion Place 1 drop into both eyes 2 (two) times daily.    [name] Kitchen desmopressin (DDAVP) 0.2 MG tablet Take 0.6 mg by mouth at bedtime.     [name] Kitchen linaclotide (LINZESS) 290 MCG CAPS capsule Take 1 capsule (290 mcg total) by mouth daily before breakfast. 90 capsule 3  . Multiple Vitamins-Minerals (MULTIVITAMIN ADULT PO) Take 1 tablet by mouth daily.    [name] Kitchen MYRBETRIQ 50 MG TB24 tablet Take 1 tablet by mouth daily.    . NON FORMULARY Take 2 drops by mouth daily. TINCTURE OF GARLIC    .  pantoprazole (PROTONIX) 40 MG tablet Take 1 tablet (40 mg total) by mouth daily. 90 tablet 3  . fenofibrate 160 MG tablet Take 1 tablet (160 mg total) by mouth daily. 90 tablet 3  . simvastatin (ZOCOR) 40 MG tablet TAKE 1 TABLET DAILY AT 6 P.M. 90 tablet 3  . carbidopa-levodopa (SINEMET IR) 25-100 MG tablet Take by mouth.    . mirabegron ER (MYRBETRIQ) 25 MG TB24 tablet Take 25 mg by mouth daily.    [name] Kitchen omega-3 acid ethyl esters (LOVAZA) 1 g capsule Take by mouth.    . Omega-3 Fatty Acids (FISH OIL) 1200 MG CAPS Take 1 capsule (1,200 mg total) by mouth daily.    . ranitidine (ZANTAC) 150 MG capsule Take 150 mg by mouth daily as needed.      No facility-administered medications prior to visit.      Per HPI unless  specifically indicated in ROS section below Review of Systems Objective:    BP (!) 158/80 (BP Location: Right Arm, Patient Position: Sitting, Cuff Size: Normal)   Pulse (!) 58   Temp 98.2 F (36.8 C) (Temporal)   Ht 5\' 10"  (1.778 m)   Wt 230 lb 6 oz (104.5 kg)   SpO2 96%   BMI 33.06 kg/m   Wt Readings from Last 3 Encounters:  01/13/19 230 lb 6 oz (104.5 kg)  12/03/18 233 lb 12.8 oz (106.1 kg)  01/04/18 236 lb (107 kg)    Physical Exam Vitals signs and nursing note reviewed.  Constitutional:      General: He is not in acute distress.    Appearance: He is well-developed.  HENT:     Head: Normocephalic and atraumatic.     Right Ear: Hearing, tympanic membrane, ear canal and external ear normal.     Left Ear: Hearing, tympanic membrane, ear canal and external ear normal.     Nose: Nose normal.     Mouth/Throat:     Mouth: Mucous membranes are dry.     Pharynx: Uvula midline. No oropharyngeal exudate or posterior oropharyngeal erythema.  Eyes:     General: No scleral icterus.    Extraocular Movements: Extraocular movements intact.     Conjunctiva/sclera: Conjunctivae normal.     Pupils: Pupils are equal, round, and reactive to light.  Neck:     Musculoskeletal: Normal range of motion and neck supple.     Vascular: No carotid bruit.  Cardiovascular:     Rate and Rhythm: Normal rate and regular rhythm.     Pulses: Normal pulses.          Radial pulses are 2+ on the right side and 2+ on the left side.     Heart sounds: Normal heart sounds. No murmur.  Pulmonary:     Effort: Pulmonary effort is normal. No respiratory distress.     Breath sounds: Normal breath sounds. No wheezing, rhonchi or rales.  Abdominal:     General: Abdomen is flat. Bowel sounds are normal. There is no distension.     Palpations: Abdomen is soft. There is no mass.     Tenderness: There is no abdominal tenderness. There is no guarding or rebound.     Hernia: No hernia is present.  Musculoskeletal:  Normal range of motion.  Lymphadenopathy:     Cervical: No cervical adenopathy.  Skin:    General: Skin is warm and dry.     Findings: No rash.  Neurological:     General: No focal deficit present.  Mental Status: He is alert and oriented to person, place, and time.     Comments:  CN grossly intact, station and gait intact Recall 3/3 Calculation D-L-D  Psychiatric:        Mood and Affect: Mood normal.        Behavior: Behavior normal.        Thought Content: Thought content normal.        Judgment: Judgment normal.       Results for orders placed or performed in visit on 01/09/19  Hemoglobin A1c  Result Value Ref Range   Hgb A1c MFr Bld 5.7 4.6 - 6.5 %  Comprehensive metabolic panel  Result Value Ref Range   Sodium 139 135 - 145 mEq/L   Potassium 4.4 3.5 - 5.1 mEq/L   Chloride 103 96 - 112 mEq/L   CO2 32 19 - 32 mEq/L   Glucose, Bld 106 (H) 70 - 99 mg/dL   BUN 16 6 - 23 mg/dL   Creatinine, Ser 1.13 0.40 - 1.50 mg/dL   Total Bilirubin 0.7 0.2 - 1.2 mg/dL   Alkaline Phosphatase 37 (L) 39 - 117 U/L   AST 19 0 - 37 U/L   ALT 4 0 - 53 U/L   Total Protein 6.6 6.0 - 8.3 g/dL   Albumin 4.2 3.5 - 5.2 g/dL   Calcium 10.2 8.4 - 10.5 mg/dL   GFR 62.06 >60.00 mL/min  Lipid panel  Result Value Ref Range   Cholesterol 126 0 - 200 mg/dL   Triglycerides 94.0 0.0 - 149.0 mg/dL   HDL 40.80 >39.00 mg/dL   VLDL 18.8 0.0 - 40.0 mg/dL   LDL Cholesterol 66 0 - 99 mg/dL   Total CHOL/HDL Ratio 3    NonHDL 85.12    Assessment & Plan:   Problem List Items Addressed This Visit    Prediabetes    Encouraged limiting added sugars in diet.       Parkinson's disease [facility])    Appreciate neuro care - now on sinemet.       Overactive bladder    Continues seeing uro ([facility]stedt) now undergoing PTNS.       Obesity, Class I, BMI 30-34.9    Encourage healthy diet and lifestyle.       Medicare annual wellness visit, subsequent - Primary    I have personally reviewed the Medicare  Annual Wellness questionnaire and have noted 1. The patient's medical and social history 2. Their use of alcohol, tobacco or illicit drugs 3. Their current medications and supplements 4. The patient's functional ability including ADL's, fall risks, home safety risks and hearing or visual impairment. Cognitive function has been assessed and addressed as indicated.  5. Diet and physical activity 6. Evidence for depression or mood disorders The patients weight, height, BMI have been recorded in the chart. I have made referrals, counseling and provided education to the patient based on review of the above and I have provided the pt with a written personalized care plan for preventive services. Provider list updated.. See scanned questionairre as needed for further documentation. Reviewed preventative protocols and updated unless pt declined.       Glaucoma    Sees glaucoma specialist.      GERD (gastroesophageal reflux disease)    Continue pantoprazole through GI.       Dyslipidemia    Has been off [place] for the past year. Continues statin and fibrate. FLP showing great control.      Relevant Medications  fenofibrate 160 MG tablet   simvastatin (ZOCOR) 40 MG tablet   Chronic constipation    Continue linzess.       BPH without obstruction/lower urinary tract symptoms    Continues seeing uro (Dahlstedt) now undergoing PTNS.           Meds ordered this encounter  Medications  . fenofibrate 160 MG tablet    Sig: Take 1 tablet (160 mg total) by mouth daily.    Dispense:  90 tablet    Refill:  3  . simvastatin (ZOCOR) 40 MG tablet    Sig: TAKE 1 TABLET DAILY AT 6 P.M.    Dispense:  90 tablet    Refill:  3   No orders of the defined types were placed in this encounter.   Follow up plan: Return in about 1 year (around 01/13/2020) for medicare wellness visit.  Ria Bush, MD

## 2019-01-13 NOTE — Assessment & Plan Note (Signed)
Appreciate neuro care - now on sinemet.

## 2019-01-13 NOTE — Patient Instructions (Signed)
You are doing well today. Continue current medicines. Return as needed or in 1 year for next wellness visit.   Health Maintenance After Age 82 After age 61, you are at a higher risk for certain long-term diseases and infections as well as injuries from falls. Falls are a major cause of broken bones and head injuries in people who are older than age 56. Getting regular preventive care can help to keep you healthy and well. Preventive care includes getting regular testing and making lifestyle changes as recommended by your health care provider. Talk with your health care provider about:  Which screenings and tests you should have. A screening is a test that checks for a disease when you have no symptoms.  A diet and exercise plan that is right for you. What should I know about screenings and tests to prevent falls? Screening and testing are the best ways to find a health problem early. Early diagnosis and treatment give you the best chance of managing medical conditions that are common after age 6. Certain conditions and lifestyle choices may make you more likely to have a fall. Your health care provider may recommend:  Regular vision checks. Poor vision and conditions such as cataracts can make you more likely to have a fall. If you wear glasses, make sure to get your prescription updated if your vision changes.  Medicine review. Work with your health care provider to regularly review all of the medicines you are taking, including over-the-counter medicines. Ask your health care provider about any side effects that may make you more likely to have a fall. Tell your health care provider if any medicines that you take make you feel dizzy or sleepy.  Osteoporosis screening. Osteoporosis is a condition that causes the bones to get weaker. This can make the bones weak and cause them to break more easily.  Blood pressure screening. Blood pressure changes and medicines to control blood pressure can make  you feel dizzy.  Strength and balance checks. Your health care provider may recommend certain tests to check your strength and balance while standing, walking, or changing positions.  Foot health exam. Foot pain and numbness, as well as not wearing proper footwear, can make you more likely to have a fall.  Depression screening. You may be more likely to have a fall if you have a fear of falling, feel emotionally low, or feel unable to do activities that you used to do.  Alcohol use screening. Using too much alcohol can affect your balance and may make you more likely to have a fall. What actions can I take to lower my risk of falls? General instructions  Talk with your health care provider about your risks for falling. Tell your health care provider if: ? You fall. Be sure to tell your health care provider about all falls, even ones that seem minor. ? You feel dizzy, sleepy, or off-balance.  Take over-the-counter and prescription medicines only as told by your health care provider. These include any supplements.  Eat a healthy diet and maintain a healthy weight. A healthy diet includes low-fat dairy products, low-fat (lean) meats, and fiber from whole grains, beans, and lots of fruits and vegetables. Home safety  Remove any tripping hazards, such as rugs, cords, and clutter.  Install safety equipment such as grab bars in bathrooms and safety rails on stairs.  Keep rooms and walkways well-lit. Activity   Follow a regular exercise program to stay fit. This will help you maintain your  balance. Ask your health care provider what types of exercise are appropriate for you.  If you need a cane or walker, use it as recommended by your health care provider.  Wear supportive shoes that have nonskid soles. Lifestyle  Do not drink alcohol if your health care provider tells you not to drink.  If you drink alcohol, limit how much you have: ? 0-1 drink a day for women. ? 0-2 drinks a day for  men.  Be aware of how much alcohol is in your drink. In the [place] one drink equals one typical bottle of beer (12 oz), one-half glass of wine (5 oz), or one shot of hard liquor (1 oz).  Do not use any products that contain nicotine or tobacco, such as cigarettes and e-cigarettes. If you need help quitting, ask your health care provider. Summary  Having a healthy lifestyle and getting preventive care can help to protect your health and wellness after age 20.  Screening and testing are the best way to find a health problem early and help you avoid having a fall. Early diagnosis and treatment give you the best chance for managing medical conditions that are more common for people who are older than age 30.  Falls are a major cause of broken bones and head injuries in people who are older than age 62. Take precautions to prevent a fall at home.  Work with your health care provider to learn what changes you can make to improve your health and wellness and to prevent falls. This information is not intended to replace advice given to you by your health care provider. Make sure you discuss any questions you have with your health care provider. Document Released: 04/04/2017 Document Revised: 09/12/2018 Document Reviewed: 04/04/2017 Elsevier Patient Education  2020 [employer].

## 2019-01-13 NOTE — Therapy (Signed)
[place] MAIN [facility] SERVICES [address] [place], [place], [address] Phone: [phone]   Fax:  [phone]  Physical Therapy Evaluation  Patient Details  Name: Joshua Everett MRN: [id number] Date of Birth: [birth year] Referring Provider (PT): Jennings Books   Encounter Date: 01/13/2019  PT End of Session - 01/13/19 1415    Visit Number  1    Number of Visits  17    Date for PT Re-Evaluation  02/17/19    PT Start Time  0200    PT Stop Time  0300    PT Time Calculation (min)  60 min       Past Medical History:  Diagnosis Date  . BPH without obstruction/lower urinary tract symptoms 09/08/2014  . Chronic constipation 09/08/2014  . GERD (gastroesophageal reflux disease)   . Glaucoma   . History of chicken pox   . Hyperlipidemia   . Obesity, Class I, BMI 30-34.9 09/08/2014  . Prediabetes 07/31/2015  . Rosacea 07/06/2015  . Urine incontinence     Past Surgical History:  Procedure Laterality Date  . COLONOSCOPY  03/2009   1 TA, diverticulosis, rpt 5 yrs (Dr [facility] in [facility])  . ESOPHAGOGASTRODUODENOSCOPY  03/2009   small HH, gastric polyps biopsied, dilation for dysphagia ([facility] [place])  . HEMORRHOID SURGERY  1976    There were no vitals filed for this visit.   Subjective Assessment - 01/13/19 1406    Subjective  Patient has a new dx of parkinsons disease about 6 months ago.    Pertinent History  Patient presents with parkinsons disease. His feet get in his way and he falls against the shower door .He has not used a cane. He wants to be able to stand on one foot and also do a step up exercise. He has higher bed and has some difficulty with getting into the bed.He is working out in the yard and does not think he is having any coordination or hand. He is having some balance difficulties.    Currently in Pain?  No/denies    Multiple Pain Sites  No         [facility] PT Assessment - 01/13/19 1412      Assessment   Medical Diagnosis  Parkinsons disease    Referring Provider (PT)  Manuella Ghazi, [place]    Onset Date/Surgical Date  01/05/19    Hand Dominance  Left    Prior Therapy  no      Precautions   Precautions  None      Restrictions   Weight Bearing Restrictions  No      Balance Screen   Has the patient fallen in the past 6 months  No    Has the patient had a decrease in activity level because of a fear of falling?   Yes    Is the patient reluctant to leave their home because of a fear of falling?   No      Home Film/video editor residence    Living Arrangements  Spouse/significant other    Available Help at Discharge  Family    Type of [place] to enter    Entrance Stairs-Number of Steps  1    Entrance Stairs-Rails  None    Home Layout  One level    Home Equipment  None      Prior Function   Level of Independence  Independent;Independent with household mobility without device    Vocation  Retired    Office manager, ;yard work      Charity fundraiser Status  Within Advertising copywriter for tasks assessed         PAIN: no pain  POSTURE: trunk flex with standing    PROM/AROM: WNL BUE and BLE  STRENGTH:  Graded on a 0-5 scale Muscle Group Left Right                          Hip Flex 4/5 4/5  Hip Abd 4/5 4/5  Hip Add 4/5 4/5  Hip Ext 4/5 4/5  Hip IR/ER 4/5 4/5  Knee Flex 4/5 4/5  Knee Ext 4/5 4/5  Ankle DF 4/5 4/5  Ankle PF 4/5 4/5   SENSATION: WNL BUE, BLE    FUNCTIONAL MOBILITY: sit to stand transfers without UE support MI, supine to sit in bed MI  BALANCE: Standing Dynamic Balance  Normal Stand independently unsupported, able to weight shift and cross midline maximally   Good Stand independently unsupported, able to weight shift and cross midline moderately x  Good-/Fair+ Stand independently unsupported, able to weight shift across midline minimally   Fair Stand independently unsupported, weight shift,  and reach ipsilaterally, loss of balance when crossing midline   Poor+ Able to stand with Min A and reach ipsilaterally, unable to weight shift   Poor Able to stand with Mod A and minimally reach ipsilaterally, unable to cross midline.    Static Standing Balance  Normal Able to maintain standing balance against maximal resistance   Good Able to maintain standing balance against moderate resistance x  Good-/Fair+ Able to maintain standing balance against minimal resistance   Fair Able to stand unsupported without UE support and without LOB for 1-2 min   Fair- Requires Min A and UE support to maintain standing without loss of balance   Poor+ Requires mod A and UE support to maintain standing without loss of balance   Poor Requires max A and UE support to maintain standing balance without loss       GAIT: Patient ambulates without AD with decreased gait speed.  OUTCOME MEASURES: TEST Outcome Interpretation  5 times sit<>stand 16.21sec >70 yo, >15 sec indicates increased risk for falls  10 meter walk test   .96              m/s <1.0 m/s indicates increased risk for falls; limited community ambulator  Timed up and Go   11.96, carry 13.04, cog 11.61              sec <14 sec indicates increased risk for falls  6 minute walk test 910               Feet 1000 feet is community ambulator      [address] Peg Test L: 30.46               R: 34.52 26 sec is normal for male 84 yr old            Objective measurements completed on examination: See above findings.              PT Education - 01/13/19 1511    Education Details  plan of care    Person(s) Educated  Patient    Methods  Explanation    Comprehension  Verbalized understanding  PT Short Term Goals - 01/13/19 1506      PT SHORT TERM GOAL #1   Title  Patient will be independent in home exercise program to improve strength/mobility for better functional independence with ADLs    Time  2    Period  Weeks    Status   New    Target Date  02/03/19        PT Long Term Goals - 01/13/19 1507      PT LONG TERM GOAL #1   Title  Patient (> 39 years old) will complete five times sit to stand test in < 15 seconds indicating an increased LE strength and improved balance.    Time  4    Period  Weeks    Status  New    Target Date  03/17/19      PT LONG TERM GOAL #2   Title  Patient will increase six minute walk test distance to >1000 for progression to community ambulator and improve gait ability    Time  4    Period  Weeks    Status  New    Target Date  03/17/19      PT LONG TERM GOAL #3   Title  Patient will reduce timed up and go to <11 seconds to reduce fall risk and demonstrate improved transfer/gait ability.    Time  4    Period  Weeks    Status  New    Target Date  03/17/19      PT LONG TERM GOAL #4   Title  Patient will decrease 6 Peg score score by > 5 points to demonstrate improved coordination of hand  functional activities.    Time  4    Period  Weeks    Status  New    Target Date  03/17/19             Plan - 01/13/19 1501    Clinical Impression Statement  Patient presents with Dx of Parkinsons Disease and has difficulty with walking, shuffling and decreased coordination of LE's. He has decresed outcome measures that indicate a falls risk. He has decreased dynamic standing balance and will benefit from skilled PT to improve safety and mobility.    Personal Factors and Comorbidities  Age    Examination-Activity Limitations  Bed Mobility;Transfers;Locomotion Level    Stability/Clinical Decision Making  Stable/Uncomplicated    Clinical Decision Making  Low    Rehab Potential  Good    PT Frequency  4x / week    PT Duration  4 weeks    PT Treatment/Interventions  Gait training;Stair training;Functional mobility training;Therapeutic activities;Balance training;Patient/family education;Therapeutic exercise       Patient will benefit from skilled therapeutic intervention in order  to improve the following deficits and impairments:  Abnormal gait, Decreased balance, Decreased endurance, Decreased mobility, Difficulty walking, Decreased coordination, Decreased safety awareness, Decreased strength  Visit Diagnosis: 1. Difficulty in walking, not elsewhere classified   2. Muscle weakness (generalized)        Problem List Patient Active Problem List   Diagnosis Date Noted  . Medicare annual wellness visit, subsequent 01/13/2019  . Parkinson's disease ([place]) 03/12/2018  . Abnormal gait 01/04/2018  . Overactive bladder 05/21/2017  . Chronic pain of right knee 01/01/2017  . Chest pain with moderate risk for cardiac etiology 08/17/2016  . Advanced care planning/counseling discussion 01/23/2016  . Prediabetes 07/31/2015  . Rosacea 07/06/2015  . Personal history of other malignant neoplasm of  skin 11/19/2014  . Abdominal wall hernia 09/08/2014  . BPH without obstruction/lower urinary tract symptoms 09/08/2014  . Chronic constipation 09/08/2014  . Dyslipidemia 09/08/2014  . Glaucoma 09/08/2014  . GERD (gastroesophageal reflux disease) 09/08/2014  . Obesity, Class I, BMI 30-34.9 09/08/2014  . Nocturia 11/13/2012    Alanson Puls, PT DPT 01/13/2019, 3:15 PM  [place] MAIN [facility] SERVICES [address] [place], [place], [address] Phone: [phone]   Fax:  [phone]  Name: Joshua Everett MRN: [id number] Date of Birth: 14-Nov-1934

## 2019-01-13 NOTE — Assessment & Plan Note (Signed)
Continue pantoprazole through GI.

## 2019-01-13 NOTE — Assessment & Plan Note (Signed)

## 2019-01-16 ENCOUNTER — Encounter: Payer: Self-pay | Admitting: Family Medicine

## 2019-01-16 NOTE — Telephone Encounter (Signed)
Spoke with pt and pt's wife, [name], asking about pt's HA.  Says he's had them on and off 6-8 mos.  They are wondering if it is Parkinson related.

## 2019-01-17 DIAGNOSIS — H401131 Primary open-angle glaucoma, bilateral, mild stage: Secondary | ICD-10-CM | POA: Diagnosis not present

## 2019-01-20 ENCOUNTER — Other Ambulatory Visit: Payer: Self-pay

## 2019-01-20 ENCOUNTER — Ambulatory Visit: Payer: Medicare Other | Admitting: Physical Therapy

## 2019-01-20 DIAGNOSIS — R262 Difficulty in walking, not elsewhere classified: Secondary | ICD-10-CM | POA: Diagnosis not present

## 2019-01-20 DIAGNOSIS — M6281 Muscle weakness (generalized): Secondary | ICD-10-CM

## 2019-01-20 MED ORDER — AMLODIPINE BESYLATE 2.5 MG PO TABS: 2.5000 mg | ORAL_TABLET | Freq: Every day | ORAL | 6 refills | Status: DC

## 2019-01-20 NOTE — Therapy (Signed)
[place] MAIN [facility] SERVICES [address] [place], [place], [address] Phone: [phone]   Fax:  [phone]  Physical Therapy Treatment  Patient Details  Name: Joshua Everett MRN: [id number] Date of Birth: [birth year] Referring Provider (PT): Jennings Books   Encounter Date: 01/20/2019  PT End of Session - 01/20/19 1406    Visit Number  2    Number of Visits  17    Date for PT Re-Evaluation  02/17/19    PT Start Time  0200    PT Stop Time  0300    PT Time Calculation (min)  60 min    Equipment Utilized During Treatment  Gait belt    Activity Tolerance  Patient tolerated treatment well    Behavior During Therapy  WFL for tasks assessed/performed       Past Medical History:  Diagnosis Date  . BPH without obstruction/lower urinary tract symptoms 09/08/2014  . Chronic constipation 09/08/2014  . GERD (gastroesophageal reflux disease)   . Glaucoma   . History of chicken pox   . Hyperlipidemia   . Obesity, Class I, BMI 30-34.9 09/08/2014  . Prediabetes 07/31/2015  . Rosacea 07/06/2015  . Urine incontinence     Past Surgical History:  Procedure Laterality Date  . COLONOSCOPY  03/2009   1 TA, diverticulosis, rpt 5 yrs (Dr Acquan[facility] in [facility])  . ESOPHAGOGASTRODUODENOSCOPY  03/2009   small HH, gastric polyps biopsied, dilation for dysphagia ([facility] [place])  . HEMORRHOID SURGERY  1976    There were no vitals filed for this visit.  Subjective Assessment - 01/20/19 1406    Subjective  Patient has a new dx of parkinsons disease about 6 months ago.    Pertinent History  Patient presents with parkinsons disease. His feet get in his way and he falls against the shower door .He has not used a cane. He wants to be able to stand on one foot and also do a step up exercise. He has higher bed and has some difficulty with getting into the bed.He is working out in the yard and does not think he is having any coordination or hand. He is  having some balance difficulties.    Currently in Pain?  No/denies    Multiple Pain Sites  No       Treatment:  Patient seen for LSVT Daily Session Maximal Daily Exercises for facilitation/coordination of movement Sustained movements are designed to rescale the amplitude of movement output for generalization to daily functional activities .Performed as follows for 1 set of 10 repetitions each multidirectional sustained movements  1) Floor to ceiling , cues to hold for 10 seconds,needs modeling for correct positions, VC to reach out further and to reach up to the ceiling 2) Side to side multidirectional Repetitive movements performed in sitting and are designed to provide retraining effort needed for sustained muscle activation in tasks, cues to lean fwd and hold position x 10 counts: Patient does not move fwd<>sidesitting<> to fwd and is not able to transition or stretch out his leg very far 3) Step and reach forward , cues for good knee flex, cues to move UE and LE together, cues to rotate his arms up to pronate his forearms 4) Step and reach backwards , cues for BUE back, getting toe up and bending back knee 5) Step and reach sideways, cues to turn her head sideways, and turn his head back to neutral, and to rotate his arms and pronate  forearms 6) Rock and reach forward/backward , cues to reach far fwd with UE's, patient not comfortable with feet apart, not rocking very far to get a good weight shift, not able to raise his heel or raise his toes much 7) Rock and reach sideways, cues for twist and look behind, only rotates side ways, unable to twist around or turn to look behind  functional component task with supervision 5 reps and simulated activities for: 1. Sit to standx 10 needs cues to begin with UE up for correct start position 2.Stepping up to 6 inch stool in sitting<>stool<>floor<>stool<> floor x 5 to mimic getting his feet in and out of the car, needs cues to really raise his  feet up high 3.Practiced stepping up to first step at the stairs from floor, no UE assist, and from purple foam , no UE support with multiple LOB  4 & 5 : time constraints and will start wallet out of pocket  tomorrow and a 5th functional component Patient given daily carryover task to complete.  walking with big arm swing x 1000 feet, needs cues to swing big  CGA and Maz verbal cues used throughout with increased in postural sway and LOB most seen with narrow base of support.. Continues to have balance deficits typical with diagnosis.                         PT Education - 01/20/19 1406    Education Details  LSVT BIG    Person(s) Educated  Patient    Methods  Explanation    Comprehension  Verbalized understanding;Returned demonstration;Verbal cues required;Need further instruction       PT Short Term Goals - 01/13/19 1506      PT SHORT TERM GOAL #1   Title  Patient will be independent in home exercise program to improve strength/mobility for better functional independence with ADLs    Time  2    Period  Weeks    Status  New    Target Date  02/03/19        PT Long Term Goals - 01/13/19 1507      PT LONG TERM GOAL #1   Title  Patient (> 82 years old) will complete five times sit to stand test in < 15 seconds indicating an increased LE strength and improved balance.    Time  4    Period  Weeks    Status  New    Target Date  03/17/19      PT LONG TERM GOAL #2   Title  Patient will increase six minute walk test distance to >1000 for progression to community ambulator and improve gait ability    Time  4    Period  Weeks    Status  New    Target Date  03/17/19      PT LONG TERM GOAL #3   Title  Patient will reduce timed up and go to <11 seconds to reduce fall risk and demonstrate improved transfer/gait ability.    Time  4    Period  Weeks    Status  New    Target Date  03/17/19      PT LONG TERM GOAL #4   Title  Patient will decrease 6 Peg  score score by > 5 points to demonstrate improved coordination of hand  functional activities.    Time  4    Period  Weeks    Status  New  Target Date  03/17/19            Plan - 01/20/19 1412    Clinical Impression Statement  Patient demonstrates improved stability and strength allowing patient to perform standing interventions without rest periods.  Patient performs beginning LSVT BIG exercises to shift weight and perform reaching and rotation standing activities with max assist. Patient fatigues quickly with exercises requiring rest breaks at this time. Patient will continue to benefit from skilled physical therapy to improve pain and mobility.    Personal Factors and Comorbidities  Age    Examination-Activity Limitations  Bed Mobility;Transfers;Locomotion Level    Stability/Clinical Decision Making  Stable/Uncomplicated    Rehab Potential  Good    PT Frequency  4x / week    PT Duration  4 weeks    PT Treatment/Interventions  Gait training;Stair training;Functional mobility training;Therapeutic activities;Balance training;Patient/family education;Therapeutic exercise       Patient will benefit from skilled therapeutic intervention in order to improve the following deficits and impairments:  Abnormal gait, Decreased balance, Decreased endurance, Decreased mobility, Difficulty walking, Decreased coordination, Decreased safety awareness, Decreased strength  Visit Diagnosis: 1. Difficulty in walking, not elsewhere classified   2. Muscle weakness (generalized)        Problem List Patient Active Problem List   Diagnosis Date Noted  . Medicare annual wellness visit, subsequent 01/13/2019  . Parkinson's disease ([place]) 03/12/2018  . Abnormal gait 01/04/2018  . Overactive bladder 05/21/2017  . Chronic pain of right knee 01/01/2017  . Chest pain with moderate risk for cardiac etiology 08/17/2016  . Advanced care planning/counseling discussion 01/23/2016  . Prediabetes 07/31/2015   . Rosacea 07/06/2015  . Personal history of other malignant neoplasm of skin 11/19/2014  . Abdominal wall hernia 09/08/2014  . BPH without obstruction/lower urinary tract symptoms 09/08/2014  . Chronic constipation 09/08/2014  . Dyslipidemia 09/08/2014  . Glaucoma 09/08/2014  . GERD (gastroesophageal reflux disease) 09/08/2014  . Obesity, Class I, BMI 30-34.9 09/08/2014  . Nocturia 11/13/2012    Alanson Puls, PT DPT 01/20/2019, 2:35 PM  [place] MAIN [facility] SERVICES [address] [place], [place], [address] Phone: [phone]   Fax:  [phone]  Name: Ike Maragh MRN: [id number] Date of Birth: [birth year]

## 2019-01-20 NOTE — Addendum Note (Signed)
Addended by: Ria Bush on: 01/20/2019 08:04 AM   Modules accepted: Orders

## 2019-01-21 ENCOUNTER — Other Ambulatory Visit: Payer: Self-pay

## 2019-01-21 ENCOUNTER — Ambulatory Visit: Payer: Medicare Other | Admitting: Physical Therapy

## 2019-01-21 ENCOUNTER — Encounter: Payer: Self-pay | Admitting: Physical Therapy

## 2019-01-21 DIAGNOSIS — R262 Difficulty in walking, not elsewhere classified: Secondary | ICD-10-CM

## 2019-01-21 DIAGNOSIS — M6281 Muscle weakness (generalized): Secondary | ICD-10-CM | POA: Diagnosis not present

## 2019-01-21 NOTE — Therapy (Signed)
[place] MAIN [facility] SERVICES [address] [place], [place], [address] Phone: [phone]   Fax:  [phone]  Physical Therapy Treatment  Patient Details  Name: Joshua Everett MRN: [id number] Date of Birth: 01/23/1935 Referring Provider (PT): Jennings Books   Encounter Date: 01/21/2019  PT End of Session - 01/21/19 1406    Visit Number  3    Number of Visits  17    Date for PT Re-Evaluation  02/17/19    PT Start Time  0204    PT Stop Time  0300    PT Time Calculation (min)  56 min    Equipment Utilized During Treatment  Gait belt    Activity Tolerance  Patient tolerated treatment well    Behavior During Therapy  [facility] for tasks assessed/performed       Past Medical History:  Diagnosis Date  . BPH without obstruction/lower urinary tract symptoms 09/08/2014  . Chronic constipation 09/08/2014  . GERD (gastroesophageal reflux disease)   . Glaucoma   . History of chicken pox   . Hyperlipidemia   . Obesity, Class I, BMI 30-34.9 09/08/2014  . Prediabetes 07/31/2015  . Rosacea 07/06/2015  . Urine incontinence     Past Surgical History:  Procedure Laterality Date  . COLONOSCOPY  03/2009   1 TA, diverticulosis, rpt 5 yrs (Dr [facility] in [facility])  . ESOPHAGOGASTRODUODENOSCOPY  03/2009   small HH, gastric polyps biopsied, dilation for dysphagia ([facility] [place])  . HEMORRHOID SURGERY  1976    There were no vitals filed for this visit.  Subjective Assessment - 01/21/19 1404    Subjective  Patient has a new dx of parkinsons disease about 6 months ago.    Pertinent History  Patient presents with parkinsons disease. His feet get in his way and he falls against the shower door .He has not used a cane. He wants to be able to stand on one foot and also do a step up exercise. He has higher bed and has some difficulty with getting into the bed.He is working out in the yard and does not think he is having any coordination or hand. He is  having some balance difficulties.    Currently in Pain?  No/denies    Multiple Pain Sites  No              Treatment:  Patient seen for LSVT Daily Session Maximal Daily Exercises for facilitation/coordination of movement Sustained movements are designed to rescale the amplitude of movement output for generalization to daily functional activities .Performed as follows for 1 set of 10 repetitions each multidirectional sustained movements  1) Floor to ceiling , cues to hold for 10 seconds,needs modeling for correct positions, VC to reach out further and to reach up to the ceiling 2) Side to side, cues to lean fwd and hold position x 10 counts: Patient does not move fwd<>sidesitting<> to fwd and is not able to transition or stretch out his leg. 3) Step and reach forward , cues for good knee flex, cues to move UE and LE together, cues to rotate his arms up to pronate his forearms 4) Step and reach backwards , cues for BUE back, getting toe up and bending back knee 5) Step and reach sideways, cues to turn her head sideways, and turn his head back to neutral, and to rotate his arms and pronate forearms 6) Rock and reach forward/backward , cues to reach far fwd with UE's, patient  not comfortable with feet apart, not rocking very far to get a good weight shift, not able to raise his heel or raise his toes much 7) Rock and reach sideways, cues for twist and look behind, only rotates side ways, unable to twist around or turn to look behind  functional component task with supervision 5 reps and simulated activities for: 1. Sit to standx 10 needs cues to begin with UE up for correct start position 2.Stepping up to 6 inch stool in sitting<>stool<>floor<>stool<> floor x 5 to mimic getting his feet in and out of the car, needs cues to really raise his feet up high 3.Practiced stepping up to first step at the stairs from floor, no UE assist, and from purple foam , no UE support with multiple  LOB 4 & 5 : time constraints  And he forgot his wallet and will start wallet out of pocket  tomorrow and a 5th functional component Patient given daily carryover task to complete.  walking with big arm swing x 1000 feet, needs cues to swing big  CGA and Maz verbal cues used throughout with increased in postural sway and LOB most seen with narrow base of support.. Continues to have balance deficits typical with diagnosis.                         PT Education - 01/21/19 1405    Education Details  LSVT BIG    Person(s) Educated  Patient    Methods  Explanation    Comprehension  Verbalized understanding       PT Short Term Goals - 01/13/19 1506      PT SHORT TERM GOAL #1   Title  Patient will be independent in home exercise program to improve strength/mobility for better functional independence with ADLs    Time  2    Period  Weeks    Status  New    Target Date  02/03/19        PT Long Term Goals - 01/13/19 1507      PT LONG TERM GOAL #1   Title  Patient (> 89 years old) will complete five times sit to stand test in < 15 seconds indicating an increased LE strength and improved balance.    Time  4    Period  Weeks    Status  New    Target Date  03/17/19      PT LONG TERM GOAL #2   Title  Patient will increase six minute walk test distance to >1000 for progression to community ambulator and improve gait ability    Time  4    Period  Weeks    Status  New    Target Date  03/17/19      PT LONG TERM GOAL #3   Title  Patient will reduce timed up and go to <11 seconds to reduce fall risk and demonstrate improved transfer/gait ability.    Time  4    Period  Weeks    Status  New    Target Date  03/17/19      PT LONG TERM GOAL #4   Title  Patient will decrease 6 Peg score score by > 5 points to demonstrate improved coordination of hand  functional activities.    Time  4    Period  Weeks    Status  New    Target Date  03/17/19  Plan - 01/21/19 1412    Clinical Impression Statement  Patient demonstrates difficulty with rotation during sitting and reaching side to side. He is not able to do the exercise correctly with modelying or verbal communication. He has significant motor control deficits and is not able to correct with modelying. He has difficutly with multi taks . Patient will benefit from skilled PT to improve his mobility,    Personal Factors and Comorbidities  Age    Examination-Activity Limitations  Bed Mobility;Transfers;Locomotion Level    Stability/Clinical Decision Making  Stable/Uncomplicated    Rehab Potential  Good    PT Frequency  4x / week    PT Duration  4 weeks    PT Treatment/Interventions  Gait training;Stair training;Functional mobility training;Therapeutic activities;Balance training;Patient/family education;Therapeutic exercise       Patient will benefit from skilled therapeutic intervention in order to improve the following deficits and impairments:  Abnormal gait, Decreased balance, Decreased endurance, Decreased mobility, Difficulty walking, Decreased coordination, Decreased safety awareness, Decreased strength  Visit Diagnosis: 1. Difficulty in walking, not elsewhere classified   2. Muscle weakness (generalized)        Problem List Patient Active Problem List   Diagnosis Date Noted  . Medicare annual wellness visit, subsequent 01/13/2019  . Parkinson's disease ([place]) 03/12/2018  . Abnormal gait 01/04/2018  . Overactive bladder 05/21/2017  . Chronic pain of right knee 01/01/2017  . Chest pain with moderate risk for cardiac etiology 08/17/2016  . Advanced care planning/counseling discussion 01/23/2016  . Prediabetes 07/31/2015  . Rosacea 07/06/2015  . Personal history of other malignant neoplasm of skin 11/19/2014  . Abdominal wall hernia 09/08/2014  . BPH without obstruction/lower urinary tract symptoms 09/08/2014  . Chronic constipation 09/08/2014  .  Dyslipidemia 09/08/2014  . Glaucoma 09/08/2014  . GERD (gastroesophageal reflux disease) 09/08/2014  . Obesity, Class I, BMI 30-34.9 09/08/2014  . Nocturia 11/13/2012    Alanson Puls, PT DPT 01/21/2019, 2:55 PM  [place] MAIN [facility] SERVICES [address] [place], [place], [address] Phone: [phone]   Fax:  [phone]  Name: Galen Russman MRN: [id number] Date of Birth: 12/03/1934

## 2019-01-22 ENCOUNTER — Ambulatory Visit: Payer: Medicare Other | Admitting: Physical Therapy

## 2019-01-22 ENCOUNTER — Other Ambulatory Visit: Payer: Self-pay

## 2019-01-22 ENCOUNTER — Encounter: Payer: Self-pay | Admitting: Physical Therapy

## 2019-01-22 DIAGNOSIS — R262 Difficulty in walking, not elsewhere classified: Secondary | ICD-10-CM | POA: Diagnosis not present

## 2019-01-22 DIAGNOSIS — M6281 Muscle weakness (generalized): Secondary | ICD-10-CM | POA: Diagnosis not present

## 2019-01-22 NOTE — Therapy (Signed)
[place] MAIN [facility] SERVICES [address] [place], [place], [address] Phone: [phone]   Fax:  [phone]  Physical Therapy Treatment  Patient Details  Name: Joshua Everett MRN: [id number] Date of Birth: 23-Sep-1934 Referring Provider (PT): Jennings Books   Encounter Date: 01/22/2019  PT End of Session - 01/22/19 1409    Visit Number  4    Number of Visits  17    Date for PT Re-Evaluation  02/17/19    PT Start Time  0200    PT Stop Time  0300    PT Time Calculation (min)  60 min    Equipment Utilized During Treatment  Gait belt    Activity Tolerance  Patient tolerated treatment well    Behavior During Therapy  WFL for tasks assessed/performed       Past Medical History:  Diagnosis Date  . BPH without obstruction/lower urinary tract symptoms 09/08/2014  . Chronic constipation 09/08/2014  . GERD (gastroesophageal reflux disease)   . Glaucoma   . History of chicken pox   . Hyperlipidemia   . Obesity, Class I, BMI 30-34.9 09/08/2014  . Prediabetes 07/31/2015  . Rosacea 07/06/2015  . Urine incontinence     Past Surgical History:  Procedure Laterality Date  . COLONOSCOPY  03/2009   1 TA, diverticulosis, rpt 5 yrs (Dr Acquan[facility] in [facility])  . ESOPHAGOGASTRODUODENOSCOPY  03/2009   small HH, gastric polyps biopsied, dilation for dysphagia ([facility] [place])  . HEMORRHOID SURGERY  1976    There were no vitals filed for this visit.  Subjective Assessment - 01/22/19 1400    Subjective  Patient reports that he is doing his exercises.    Pertinent History  Patient presents with parkinsons disease. His feet get in his way and he falls against the shower door .He has not used a cane. He wants to be able to stand on one foot and also do a step up exercise. He has higher bed and has some difficulty with getting into the bed.He is working out in the yard and does not think he is having any coordination or hand. He is having some balance  difficulties.    Currently in Pain?  No/denies    Multiple Pain Sites  No       Treatment:  Patient seen for LSVT Daily Session Maximal Daily Exercises for facilitation/coordination of movement Sustained movements are designed to rescale the amplitude of movement output for generalization to daily functional activities .Performed as follows for 1 set of 10 repetitions each multidirectional sustained movements   1) Floor to ceiling , cues to hold for 10 seconds,needs modeling for correct positions, VC to reach out furtherand to reach up to the ceiling 2) Side to side, cues to lean fwd and hold position x 10 counts: Patient does not move fwd<>sidesitting<> to fwd and is not able to transition or stretch out his leg. 3) Step and reach forward , cues for good knee flex ( he is not able to perform this) , cues to move UE and LE together, cues to rotate his arms up to pronate his forearms 4) Step and reach backwards , cues for BUE back, getting toe up and bending back knee, Patient struggles to flex back knee 5) Step and reach sideways, cues to turn her head sideways, and turn his head back to neutral, and to rotate his arms and pronate forearms 6) Rock and reach forward/backward , cues to reach far  fwd with UE's, patient not comfortable with feet apart, not rocking very far to get a good weight shift, not able to raise his heel or raise his toes much 7) Rock and reach sideways, cues for twist and look behind, only rotates side ways, unable to twist around or turn to look behind  functional component task with supervision 5 reps and simulated activities for: 1. Sit to standx 10needs cues to begin with UE up for correct start position 2.Stepping up to 6 inch stool in sitting<>stool<>floor<>stool<> floor x 5 to mimic getting his feet in and out of the car, needs cues to really raise his feet up high 3.Practicedstepping up to first step at the stairs from floor, no UE assist, and from  purple foam , no UE support with multiple LOB 4 Bed mobility : supine to sit : Needs cues to bridge and scoot to middle of mat, then cues to roll over, then cues to drop LE's , then cues to push up to sitting 5. Wallet out of pocket x 5 ( he did not wear pants with a back pocket and it was too easy to get it out of his front pocket   walking with big arm swing x 1000 feet, needs cues to swing big  CGA and Maxverbal cues used throughout with increased in postural sway and LOB most seen with narrow base of support.. Continues to have balance deficits typical with diagnosis.                        PT Education - 01/22/19 1401    Education Details  LSVT BIG    Person(s) Educated  Patient    Methods  Explanation    Comprehension  Verbalized understanding;Returned demonstration;Need further instruction       PT Short Term Goals - 01/13/19 1506      PT SHORT TERM GOAL #1   Title  Patient will be independent in home exercise program to improve strength/mobility for better functional independence with ADLs    Time  2    Period  Weeks    Status  New    Target Date  02/03/19        PT Long Term Goals - 01/13/19 1507      PT LONG TERM GOAL #1   Title  Patient (> 36 years old) will complete five times sit to stand test in < 15 seconds indicating an increased LE strength and improved balance.    Time  4    Period  Weeks    Status  New    Target Date  03/17/19      PT LONG TERM GOAL #2   Title  Patient will increase six minute walk test distance to >1000 for progression to community ambulator and improve gait ability    Time  4    Period  Weeks    Status  New    Target Date  03/17/19      PT LONG TERM GOAL #3   Title  Patient will reduce timed up and go to <11 seconds to reduce fall risk and demonstrate improved transfer/gait ability.    Time  4    Period  Weeks    Status  New    Target Date  03/17/19      PT LONG TERM GOAL #4   Title  Patient will  decrease 6 Peg score score by > 5 points to demonstrate improved coordination of  hand  functional activities.    Time  4    Period  Weeks    Status  New    Target Date  03/17/19            Plan - 01/22/19 1730    Clinical Impression Statement  Patient has decreased motor control and coordination with fwd stepping and backwards stepping with dual task activities. He needs modelying for better performance. He is not able to rotate to the side in sidesitting multi directional exercise. He needs step by step instruction and cues for correct way to perofrm supine to sitting for bed mobility. He was educated on scooting to the middle of the mat by performing bridging to move his hips and then proceed to roll to sidelying and then drop his feet in order to sit up at the edge of the bed. He will continue to benefit from LSVT BIG to improve mobity and safety.    Personal Factors and Comorbidities  Age    Examination-Activity Limitations  Bed Mobility;Transfers;Locomotion Level    Stability/Clinical Decision Making  Stable/Uncomplicated    Rehab Potential  Good    PT Frequency  4x / week    PT Duration  4 weeks    PT Treatment/Interventions  Gait training;Stair training;Functional mobility training;Therapeutic activities;Balance training;Patient/family education;Therapeutic exercise       Patient will benefit from skilled therapeutic intervention in order to improve the following deficits and impairments:  Abnormal gait, Decreased balance, Decreased endurance, Decreased mobility, Difficulty walking, Decreased coordination, Decreased safety awareness, Decreased strength  Visit Diagnosis: 1. Muscle weakness (generalized)   2. Difficulty in walking, not elsewhere classified        Problem List Patient Active Problem List   Diagnosis Date Noted  . Medicare annual wellness visit, subsequent 01/13/2019  . Parkinson's disease ([place]) 03/12/2018  . Abnormal gait 01/04/2018  . Overactive bladder  05/21/2017  . Chronic pain of right knee 01/01/2017  . Chest pain with moderate risk for cardiac etiology 08/17/2016  . Advanced care planning/counseling discussion 01/23/2016  . Prediabetes 07/31/2015  . Rosacea 07/06/2015  . Personal history of other malignant neoplasm of skin 11/19/2014  . Abdominal wall hernia 09/08/2014  . BPH without obstruction/lower urinary tract symptoms 09/08/2014  . Chronic constipation 09/08/2014  . Dyslipidemia 09/08/2014  . Glaucoma 09/08/2014  . GERD (gastroesophageal reflux disease) 09/08/2014  . Obesity, Class I, BMI 30-34.9 09/08/2014  . Nocturia 11/13/2012    Alanson Puls, PT DPT 01/22/2019, 5:36 PM  [place] MAIN [facility] SERVICES [address] [place], [place], [address] Phone: [phone]   Fax:  [phone]  Name: Parvin Stetzer MRN: [id number] Date of Birth: [birth year]

## 2019-01-23 ENCOUNTER — Other Ambulatory Visit: Payer: Self-pay

## 2019-01-23 ENCOUNTER — Ambulatory Visit (INDEPENDENT_AMBULATORY_CARE_PROVIDER_SITE_OTHER): Payer: Medicare Other

## 2019-01-23 ENCOUNTER — Telehealth: Payer: Self-pay

## 2019-01-23 ENCOUNTER — Ambulatory Visit: Payer: Medicare Other | Admitting: Physical Therapy

## 2019-01-23 VITALS — BP 176/78 | HR 58

## 2019-01-23 DIAGNOSIS — R030 Elevated blood-pressure reading, without diagnosis of hypertension: Secondary | ICD-10-CM

## 2019-01-23 MED ORDER — AMLODIPINE BESYLATE 5 MG PO TABS: 5.0000 mg | ORAL_TABLET | Freq: Every day | ORAL | 3 refills | Status: DC

## 2019-01-23 NOTE — Telephone Encounter (Signed)
Joshua Everett (DPR signed)said this AM BP was 197/86,reck was 210/94 pt rested for 20' BP was 185/97. Joshua Everett thought new BP med was sent to mail order pharmacy. I advised Joshua Everett that amlodipine 2.5 mg taking one tab daily was sent to [facility] [address] on 01/20/19. Joshua Everett voiced understanding and will pick up from pharmacy now to get pt started on med. Joshua Everett wonders if her BP cuff is accurate. Does pt need to have nurse visit to see if BP cuff accurate? Pt has mild H/A this morning; no CP,SOB or dizziness. Pt has no covid symptoms except the mild H/A, no travel and no known exposure to + covid. Joshua Everett will wait for cb and if does not hear back from [facility] prior to PT appt this afternoon at 2 PM, Joshua Everett will hold pt PT appt today.pt last seen 01/13/19.Please advise.

## 2019-01-23 NOTE — Progress Notes (Signed)
Per Dr. Danise Mina encounter order on 01/23/19, patient presents today for a nurse visit blood pressure check for ongoing follow up and management.  Vital Sign Readings today right arm- 178/80, left arm- 176/78.  Patient brought his home BP monitor to compare.  It read 177/70.   Pt started amlodipine 5 mg daily today, per Dr. Darnell Level.

## 2019-01-23 NOTE — Telephone Encounter (Signed)
With high readings I want him to take amlodipine 5mg  daily - if he's filled 2.5mg  dose, take 2 tablets each day.  Yes please schedule nurse visit for BP check and have them bring BP cuff to compare.

## 2019-01-23 NOTE — Telephone Encounter (Signed)
Spoke with pt's wife, [name] (on dpr) relaying Dr. Synthia Innocent message.  Verbalizes understanding.  Scheduled NV today at 3:45.  Will bring home BP monitor.

## 2019-01-24 ENCOUNTER — Encounter: Payer: Self-pay | Admitting: Family Medicine

## 2019-01-27 ENCOUNTER — Encounter: Payer: Self-pay | Admitting: Physical Therapy

## 2019-01-27 ENCOUNTER — Other Ambulatory Visit: Payer: Self-pay

## 2019-01-27 ENCOUNTER — Ambulatory Visit: Payer: Medicare Other | Admitting: Physical Therapy

## 2019-01-27 DIAGNOSIS — R262 Difficulty in walking, not elsewhere classified: Secondary | ICD-10-CM | POA: Diagnosis not present

## 2019-01-27 DIAGNOSIS — M6281 Muscle weakness (generalized): Secondary | ICD-10-CM | POA: Diagnosis not present

## 2019-01-27 NOTE — Therapy (Signed)
[place] MAIN [facility] SERVICES [address] [place], [place], [address] Phone: [phone]   Fax:  [phone]  Physical Therapy Treatment  Patient Details  Name: Joshua Everett MRN: [id number] Date of Birth: Dec 01, 1934 Referring Provider (PT): Jennings Books   Encounter Date: 01/27/2019  PT End of Session - 01/27/19 1422    Visit Number  5    Number of Visits  17    Date for PT Re-Evaluation  02/17/19    PT Start Time  0200    PT Stop Time  0300    PT Time Calculation (min)  60 min    Equipment Utilized During Treatment  Gait belt    Activity Tolerance  Patient tolerated treatment well    Behavior During Therapy  WFL for tasks assessed/performed       Past Medical History:  Diagnosis Date  . BPH without obstruction/lower urinary tract symptoms 09/08/2014  . Chronic constipation 09/08/2014  . GERD (gastroesophageal reflux disease)   . Glaucoma   . History of chicken pox   . Hyperlipidemia   . Obesity, Class I, BMI 30-34.9 09/08/2014  . Prediabetes 07/31/2015  . Rosacea 07/06/2015  . Urine incontinence     Past Surgical History:  Procedure Laterality Date  . COLONOSCOPY  03/2009   1 TA, diverticulosis, rpt 5 yrs (Dr Acqu[facility] Sit [facility] in [facility])  . ESOPHAGOGASTRODUODENOSCOPY  03/2009   small HH, gastric polyps biopsied, dilation for dysphagia ([facility] [place])  . HEMORRHOID SURGERY  1976    There were no vitals filed for this visit.  Subjective Assessment - 01/27/19 1421    Subjective  Patient reports that he is doing his exercises.    Pertinent History  Patient presents with parkinsons disease. His feet get in his way and he falls against the shower door .He has not used a cane. He wants to be able to stand on one foot and also do a step up exercise. He has higher bed and has some difficulty with getting into the bed.He is working out in the yard and does not think he is having any coordination or hand. He is having some balance  difficulties.    Currently in Pain?  No/denies    Multiple Pain Sites  No       Treatment:  Patient seen for LSVT Daily Session Maximal Daily Exercises for facilitation/coordination of movement Sustained movements are designed to rescale the amplitude of movement output for generalization to daily functional activities .Performed as follows for 1 set of 10 repetitions each multidirectional sustained movements   1) Floor to ceiling , cues to hold for 10 seconds,needs modeling for correct positions, VC to reach out furtherand to reach up to the ceiling 2) Side to side, cues to lean fwd and hold position x 10 counts: Patient does not move fwd<>sidesitting<> to fwd and is not able to transition or stretch out his leg. 3) Step and reach forward , cues for good knee flex ( he is not able to perform this) , cues to move UE and LE together, cues to rotate his arms up to pronate his forearms 4) Step and reach backwards , cues for BUE back, getting toe up and bending back knee, Patient struggles to flex back knee 5) Step and reach sideways, cues to turn head sideways, and turn his head back to neutral, and to rotate his arms and pronate forearms 6) Rock and reach forward/backward , cues to reach far fwd  with UE's, patient not comfortable with feet apart, not rocking very far to get a good weight shift, not able to raise his heel or raise his toes much 7) Rock and reach sideways, cues for twist and look behind, only rotates side ways, unable to twist around or turn to look behind  functional component task with supervision 5 reps and simulated activities for: 1. Sit to standx 10needs cues to begin with UE up for correct start position 2.Stepping up to 6 inch stool in sitting<>stool<>floor<>stool<> floor x 5 to mimic getting his feet in and out of the car, needs cues to really raise his feet up high 3.Practicedstepping up to first step at the stairs from floor, no UE assist, and from purple  foam , no UE support with multiple LOB 4 Bed mobility : supine to sit : Needs cues to bridge and scoot to middle of mat, then cues to roll over, then cues to drop LE's , then cues to push up to sitting 5. Wallet out of pocket x 5 ( he did not wear pants with a back pocket and it was too easy to get it out of his front pocket   walking with big arm swing x 1000 feet, needs cues to swing big   Maxverbal cues and min TC  used throughout with increased in postural sway and LOB most seen with narrow base of support.. Continues to have balance deficits typical with diagnosis. Patient needs modelying for correct                        PT Education - 01/27/19 1421    Education Details  HEP LSVT BIG    Person(s) Educated  Patient    Methods  Explanation    Comprehension  Verbalized understanding;Returned demonstration;Tactile cues required;Need further instruction       PT Short Term Goals - 01/13/19 1506      PT SHORT TERM GOAL #1   Title  Patient will be independent in home exercise program to improve strength/mobility for better functional independence with ADLs    Time  2    Period  Weeks    Status  New    Target Date  02/03/19        PT Long Term Goals - 01/13/19 1507      PT LONG TERM GOAL #1   Title  Patient (> 5 years old) will complete five times sit to stand test in < 15 seconds indicating an increased LE strength and improved balance.    Time  4    Period  Weeks    Status  New    Target Date  03/17/19      PT LONG TERM GOAL #2   Title  Patient will increase six minute walk test distance to >1000 for progression to community ambulator and improve gait ability    Time  4    Period  Weeks    Status  New    Target Date  03/17/19      PT LONG TERM GOAL #3   Title  Patient will reduce timed up and go to <11 seconds to reduce fall risk and demonstrate improved transfer/gait ability.    Time  4    Period  Weeks    Status  New    Target Date   03/17/19      PT LONG TERM GOAL #4   Title  Patient will decrease 6 Peg score  score by > 5 points to demonstrate improved coordination of hand  functional activities.    Time  4    Period  Weeks    Status  New    Target Date  03/17/19            Plan - 01/27/19 1423    Clinical Impression Statement  Patient demonstrates improved stability and strength allowing patient to perform standing interventions without rest periods. Patient performs beginning LSVT BIG exercises to shift weight and perform reaching and rotation standing activities with max assist. Patient fatigues quickly with exercises requiring rest breaks at this time. Patient will continue to benefit from skilled physical therapy to improve pain and mobility    Personal Factors and Comorbidities  Age    Examination-Activity Limitations  Bed Mobility;Transfers;Locomotion Level    Stability/Clinical Decision Making  Stable/Uncomplicated    Rehab Potential  Good    PT Frequency  4x / week    PT Duration  4 weeks    PT Treatment/Interventions  Gait training;Stair training;Functional mobility training;Therapeutic activities;Balance training;Patient/family education;Therapeutic exercise       Patient will benefit from skilled therapeutic intervention in order to improve the following deficits and impairments:  Abnormal gait, Decreased balance, Decreased endurance, Decreased mobility, Difficulty walking, Decreased coordination, Decreased safety awareness, Decreased strength  Visit Diagnosis: Muscle weakness (generalized)  Difficulty in walking, not elsewhere classified     Problem List Patient Active Problem List   Diagnosis Date Noted  . Medicare annual wellness visit, subsequent 01/13/2019  . Parkinson's disease ([employer]) 03/12/2018  . Abnormal gait 01/04/2018  . Overactive bladder 05/21/2017  . Chronic pain of right knee 01/01/2017  . Chest pain with moderate risk for cardiac etiology 08/17/2016  . Advanced care  planning/counseling discussion 01/23/2016  . Prediabetes 07/31/2015  . Rosacea 07/06/2015  . Personal history of other malignant neoplasm of skin 11/19/2014  . Abdominal wall hernia 09/08/2014  . BPH without obstruction/lower urinary tract symptoms 09/08/2014  . Chronic constipation 09/08/2014  . Dyslipidemia 09/08/2014  . Glaucoma 09/08/2014  . GERD (gastroesophageal reflux disease) 09/08/2014  . Obesity, Class I, BMI 30-34.9 09/08/2014  . Nocturia 11/13/2012    Alanson Puls, PT DPT 01/27/2019, 2:28 PM  [place] MAIN [facility] SERVICES [address] [place], [place], [address] Phone: [phone]   Fax:  [phone]  Name: Joshua Everett MRN: [id number] Date of Birth: Feb 24, 1935

## 2019-01-28 ENCOUNTER — Other Ambulatory Visit: Payer: Self-pay

## 2019-01-28 ENCOUNTER — Encounter: Payer: Self-pay | Admitting: Physical Therapy

## 2019-01-28 ENCOUNTER — Ambulatory Visit: Payer: Medicare Other | Admitting: Physical Therapy

## 2019-01-28 DIAGNOSIS — R262 Difficulty in walking, not elsewhere classified: Secondary | ICD-10-CM

## 2019-01-28 DIAGNOSIS — M6281 Muscle weakness (generalized): Secondary | ICD-10-CM

## 2019-01-28 NOTE — Therapy (Signed)
[place] MA[facility] SERVICES [address] [place], [place], [address] Phone: [phone]   Fax:  [phone]  Physical Therapy Treatment  Patient Details  Name: Joshua Everett MRN: [id number] Date of Birth: [birth year] Referring Provider (PT): Jennings Books   Encounter Date: 01/28/2019  PT End of Session - 01/28/19 1505    Visit Number  6    Number of Visits  17    Date for PT Re-Evaluation  02/17/19    PT Start Time  0200    PT Stop Time  0300    PT Time Calculation (min)  60 min    Equipment Utilized During Treatment  Gait belt    Activity Tolerance  Patient tolerated treatment well    Behavior During Therapy  WFL for tasks assessed/performed       Past Medical History:  Diagnosis Date  . BPH without obstruction/lower urinary tract symptoms 09/08/2014  . Chronic constipation 09/08/2014  . GERD (gastroesophageal reflux disease)   . Glaucoma   . History of chicken pox   . Hyperlipidemia   . Obesity, Class I, BMI 30-34.9 09/08/2014  . Prediabetes 07/31/2015  . Rosacea 07/06/2015  . Urine incontinence     Past Surgical History:  Procedure Laterality Date  . COLONOSCOPY  03/2009   1 TA, diverticulosis, rpt 5 yrs (Dr [facility] in [facility])  . ESOPHAGOGASTRODUODENOSCOPY  03/2009   small HH, gastric polyps biopsied, dilation for dysphagia ([facility])  . HEMORRHOID SURGERY  1976    There were no vitals filed for this visit.  Subjective Assessment - 01/28/19 1504    Subjective  Patient reports that he is doing his exercises.No reports of pain    Pertinent History  Patient presents with parkinsons disease. His feet get in his way and he falls against the shower door .He has not used a cane. He wants to be able to stand on one foot and also do a step up exercise. He has higher bed and has some difficulty with getting into the bed.He is working out in the yard and does not think he is having any coordination or hand. He is  having some balance difficulties.    Currently in Pain?  No/denies    Multiple Pain Sites  No           Treatment:  Patient seen for LSVT Daily Session Maximal Daily Exercises for facilitation/coordination of movement Sustained movements are designed to rescale the amplitude of movement output for generalization to daily functional activities .Performed as follows for 1 set of 10 repetitions each multidirectional sustained movements   1) Floor to ceiling , cues to hold for 10 seconds,needs modeling for correct positions, VC to reach out furtherand to reach up to the ceiling 2) Side to side, cues to lean fwd and hold position x 10 counts: Patient does not move fwd<>sidesitting<> to fwd and is not able to transition or stretch out his leg. 3) Step and reach forward , cues for good knee flex( he is not able to perform this), cues to move UE and LE together, cues to rotate his arms up to pronate his forearms 4) Step and reach backwards , cues for BUE back, getting toe up and bending back knee, Patient struggles to flex back knee 5) Step and reach sideways, cues to turn head sideways, and turn his head back to neutral, and to rotate his arms and pronate forearms 6) Rock and reach forward/backward ,  cues to reach far fwd with UE's, patient not comfortable with feet apart, not rocking very far to get a good weight shift, not able to raise his heel or raise his toes much 7) Rock and reach sideways, cues for twist and look behind, only rotates side ways, unable to twist around or turn to look behind  functional component task with supervision 5 reps and simulated activities for: 1. Sit to standx 10needs cues to begin with UE up for correct start position 2.Stepping up to 6 inch stool in sitting<>stool<>floor<>stool<> floor x 5 to mimic getting his feet in and out of the car, needs cues to really raise his feet up high 3.Practicedstepping up to first step at the stairs from floor, no  UE assist, and from purple foam , no UE support with multiple LOB 4Bed mobility : supine to sit : Needs cues to bridge and scoot to middle of mat, then cues to roll over, then cues to drop LE's , then cues to push up to sitting 5. Wallet out of pocket x 5 ( he did not wear pants with a back pocket and it was too easy to get it out of his front pocket   Walking with big arm swing x 1000 feet, needs cues to swing big   Maxverbal cues and min TC  used throughout with increased in postural sway and LOB most seen with narrow base of support.. Continues to have balance deficits typical with diagnosis. Patient needs modelying for correct performance of exercises and as the repetitions increase, he is able to correctly perform the exercises with less VC.                          PT Education - 01/28/19 1504    Education Details  HEP, LSVT BIG    Person(s) Educated  Patient    Methods  Explanation;Demonstration;Tactile cues;Verbal cues    Comprehension  Verbalized understanding;Returned demonstration;Verbal cues required;Tactile cues required;Need further instruction       PT Short Term Goals - 01/13/19 1506      PT SHORT TERM GOAL #1   Title  Patient will be independent in home exercise program to improve strength/mobility for better functional independence with ADLs    Time  2    Period  Weeks    Status  New    Target Date  02/03/19        PT Long Term Goals - 01/13/19 1507      PT LONG TERM GOAL #1   Title  Patient (> 53 years old) will complete five times sit to stand test in < 15 seconds indicating an increased LE strength and improved balance.    Time  4    Period  Weeks    Status  New    Target Date  03/17/19      PT LONG TERM GOAL #2   Title  Patient will increase six minute walk test distance to >1000 for progression to community ambulator and improve gait ability    Time  4    Period  Weeks    Status  New    Target Date  03/17/19      PT  LONG TERM GOAL #3   Title  Patient will reduce timed up and go to <11 seconds to reduce fall risk and demonstrate improved transfer/gait ability.    Time  4    Period  Weeks    Status  New    Target Date  03/17/19      PT LONG TERM GOAL #4   Title  Patient will decrease 6 Peg score score by > 5 points to demonstrate improved coordination of hand  functional activities.    Time  4    Period  Weeks    Status  New    Target Date  03/17/19            Plan - 01/28/19 1505    Clinical Impression Statement  Patient has difficulty with standing LSVT BIG exericses with sequencing, keeping his dynamic standing balance with standing stepping backwards exericse. He has decreased carryover for remembering the exercises and confuses the correct foot and direction he is turning and needs modelying to have performance improve. He will continue to benefit from skilled PT to improve mobility and safety.    Personal Factors and Comorbidities  Age    Examination-Activity Limitations  Bed Mobility;Transfers;Locomotion Level    Stability/Clinical Decision Making  Stable/Uncomplicated    Rehab Potential  Good    PT Frequency  4x / week    PT Duration  4 weeks    PT Treatment/Interventions  Gait training;Stair training;Functional mobility training;Therapeutic activities;Balance training;Patient/family education;Therapeutic exercise       Patient will benefit from skilled therapeutic intervention in order to improve the following deficits and impairments:  Abnormal gait, Decreased balance, Decreased endurance, Decreased mobility, Difficulty walking, Decreased coordination, Decreased safety awareness, Decreased strength  Visit Diagnosis: Muscle weakness (generalized)  Difficulty in walking, not elsewhere classified     Problem List Patient Active Problem List   Diagnosis Date Noted  . Medicare annual wellness visit, subsequent 01/13/2019  . Parkinson's disease ([place]) 03/12/2018  . Abnormal gait  01/04/2018  . Overactive bladder 05/21/2017  . Chronic pain of right knee 01/01/2017  . Chest pain with moderate risk for cardiac etiology 08/17/2016  . Advanced care planning/counseling discussion 01/23/2016  . Prediabetes 07/31/2015  . Rosacea 07/06/2015  . Personal history of other malignant neoplasm of skin 11/19/2014  . Abdominal wall hernia 09/08/2014  . BPH without obstruction/lower urinary tract symptoms 09/08/2014  . Chronic constipation 09/08/2014  . Dyslipidemia 09/08/2014  . Glaucoma 09/08/2014  . GERD (gastroesophageal reflux disease) 09/08/2014  . Obesity, Class I, BMI 30-34.9 09/08/2014  . Nocturia 11/13/2012    Alanson Puls, PT DPT 01/28/2019, 3:08 PM  [place] [facility] SERVICES [address] [place], [place], [address] Phone: [phone]   Fax:  [phone]  Name: Maverik Bueti MRN: [id number] Date of Birth: May 09, 1935

## 2019-01-29 ENCOUNTER — Encounter: Payer: Self-pay | Admitting: Physical Therapy

## 2019-01-29 ENCOUNTER — Ambulatory Visit: Payer: Medicare Other | Admitting: Physical Therapy

## 2019-01-29 ENCOUNTER — Other Ambulatory Visit: Payer: Self-pay

## 2019-01-29 DIAGNOSIS — M6281 Muscle weakness (generalized): Secondary | ICD-10-CM | POA: Diagnosis not present

## 2019-01-29 DIAGNOSIS — R262 Difficulty in walking, not elsewhere classified: Secondary | ICD-10-CM | POA: Diagnosis not present

## 2019-01-29 NOTE — Therapy (Signed)
[place] [facility] SERVICES [address] [place], [place], [address] Phone: [phone]   Fax:  [phone]  Physical Therapy Treatment  Patient Details  Name: Joshua Everett MRN: [id number] Date of Birth: 08/28/1934 Referring Provider (PT): Jennings Books   Encounter Date: 01/29/2019  PT End of Session - 01/29/19 1405    Visit Number  7    Number of Visits  17    Date for PT Re-Evaluation  02/17/19    PT Start Time  0200    PT Stop Time  0255    PT Time Calculation (min)  55 min    Equipment Utilized During Treatment  Gait belt    Activity Tolerance  Patient tolerated treatment well    Behavior During Therapy  WFL for tasks assessed/performed       Past Medical History:  Diagnosis Date  . BPH without obstruction/lower urinary tract symptoms 09/08/2014  . Chronic constipation 09/08/2014  . GERD (gastroesophageal reflux disease)   . Glaucoma   . History of chicken pox   . Hyperlipidemia   . Obesity, Class I, BMI 30-34.9 09/08/2014  . Prediabetes 07/31/2015  . Rosacea 07/06/2015  . Urine incontinence     Past Surgical History:  Procedure Laterality Date  . COLONOSCOPY  03/2009   1 TA, diverticulosis, rpt 5 yrs (Dr [facility] in [facility])  . ESOPHAGOGASTRODUODENOSCOPY  03/2009   small HH, gastric polyps biopsied, dilation for dysphagia ([facility])  . HEMORRHOID SURGERY  1976    There were no vitals filed for this visit.  Subjective Assessment - 01/29/19 1404    Subjective  Patient reports that he is doing his exercises.No reports of pain, He had a fall today doing his exercises.    Pertinent History  Patient presents with parkinsons disease. His feet get in his way and he falls against the shower door .He has not used a cane. He wants to be able to stand on one foot and also do a step up exercise. He has higher bed and has some difficulty with getting into the bed.He is working out in the yard and does not think he  is having any coordination or hand. He is having some balance difficulties.    Currently in Pain?  No/denies    Multiple Pain Sites  No       Treatment:  Patient seen for LSVT Daily Session Maximal Daily Exercises for facilitation/coordination of movement Sustained movements are designed to rescale the amplitude of movement output for generalization to daily functional activities .Performed as follows for 1 set of 10 repetitions each multidirectional sustained movements   1) Floor to ceiling , cues to hold for 10 seconds,needs modeling for correct positions, VC to reach out furtherand to reach up to the ceiling 2) Side to side, cues to lean fwd and hold position x 10 counts: Patient does not move fwd<>sidesitting<> to fwd and is not able to transition or stretch out his leg. 3) Step and reach forward , cues for knee flex, cues to move UE and LE together, cues to rotate his arms up to pronate his forearms 4) Step and reach backwards , cues for BUE back, getting toe up and bending back knee, Patient struggles to flex back knee 5) Step and reach sideways, cues to turn head sideways, and turn his head back to neutral, and to rotate his arms and pronate forearms 6) Rock and reach forward/backward , cues to reach far  fwd with UE's, patient not comfortable with feet apart, not rocking very far to get a good weight shift, not able to raise his heel or raise his toes much 7) Rock and reach sideways, cues for twist and look behind, only rotates side ways, unable to twist around or turn to look behind  functional component task with supervision 5 reps and simulated activities for: 1. Sit to standx 10needs cues to begin with UE up for correct start position 2.Stepping up to 6 inch stool in sitting<>stool<>floor<>stool<> floor x 5 to mimic getting his feet in and out of the car, needs cues to really raise his feet up high 3.Practicedstepping up to first step at the stairs from floor, no UE  assist, and from purple foam , no UE support with multiple LOB 4Bed mobility : supine to sit : Needs cues to bridge and scoot to middle of mat, then cues to roll over, then cues to drop LE's , then cues to push up to sitting 5. Wallet out of pocket x 5 ( he did not wear pants with a back pocket and it was too easy to get it out of his front pocket   Walking with big arm swing x 800 feet, needs cues to swing big   Modverbal cuesand min TCused throughout with increased in postural sway and LOB most seen with narrow base of support. Continues to have balance deficits typical with diagnosis.             Patient needs modelying for correctperformance of exercises and as the repetitions increase, he is able to correctly perform the exercises with less VC.                        PT Education - 01/29/19 1404    Education Details  HEP    Person(s) Educated  Patient    Methods  Explanation    Comprehension  Verbalized understanding;Returned demonstration;Need further instruction;Tactile cues required       PT Short Term Goals - 01/13/19 1506      PT SHORT TERM GOAL #1   Title  Patient will be independent in home exercise program to improve strength/mobility for better functional independence with ADLs    Time  2    Period  Weeks    Status  New    Target Date  02/03/19        PT Long Term Goals - 01/13/19 1507      PT LONG TERM GOAL #1   Title  Patient (> 65 years old) will complete five times sit to stand test in < 15 seconds indicating an increased LE strength and improved balance.    Time  4    Period  Weeks    Status  New    Target Date  03/17/19      PT LONG TERM GOAL #2   Title  Patient will increase six minute walk test distance to >1000 for progression to community ambulator and improve gait ability    Time  4    Period  Weeks    Status  New    Target Date  03/17/19      PT LONG TERM GOAL #3   Title  Patient will reduce timed up and go to  <11 seconds to reduce fall risk and demonstrate improved transfer/gait ability.    Time  4    Period  Weeks    Status  New  Target Date  03/17/19      PT LONG TERM GOAL #4   Title  Patient will decrease 6 Peg score score by > 5 points to demonstrate improved coordination of hand  functional activities.    Time  4    Period  Weeks    Status  New    Target Date  03/17/19            Plan - 01/29/19 1406    Clinical Impression Statement  Patient demonstrates difficulty with rotation during sitting and reaching side to side. He is not able to do the exercise correctly with modelying or verbal communication. He has significant motor control deficits and is not able to correct with modelying. He has difficutly with multi taks . Patient will benefit from skilled PT to improve his mobility    Personal Factors and Comorbidities  Age    Examination-Activity Limitations  Bed Mobility;Transfers;Locomotion Level    Stability/Clinical Decision Making  Stable/Uncomplicated    Rehab Potential  Good    PT Frequency  4x / week    PT Duration  4 weeks    PT Treatment/Interventions  Gait training;Stair training;Functional mobility training;Therapeutic activities;Balance training;Patient/family education;Therapeutic exercise       Patient will benefit from skilled therapeutic intervention in order to improve the following deficits and impairments:  Abnormal gait, Decreased balance, Decreased endurance, Decreased mobility, Difficulty walking, Decreased coordination, Decreased safety awareness, Decreased strength  Visit Diagnosis: Muscle weakness (generalized)  Difficulty in walking, not elsewhere classified     Problem List Patient Active Problem List   Diagnosis Date Noted  . Medicare annual wellness visit, subsequent 01/13/2019  . Parkinson's disease ([place]) 03/12/2018  . Abnormal gait 01/04/2018  . Overactive bladder 05/21/2017  . Chronic pain of right knee 01/01/2017  . Chest pain with  moderate risk for cardiac etiology 08/17/2016  . Advanced care planning/counseling discussion 01/23/2016  . Prediabetes 07/31/2015  . Rosacea 07/06/2015  . Personal history of other malignant neoplasm of skin 11/19/2014  . Abdominal wall hernia 09/08/2014  . BPH without obstruction/lower urinary tract symptoms 09/08/2014  . Chronic constipation 09/08/2014  . Dyslipidemia 09/08/2014  . Glaucoma 09/08/2014  . GERD (gastroesophageal reflux disease) 09/08/2014  . Obesity, Class I, BMI 30-34.9 09/08/2014  . Nocturia 11/13/2012    Alanson Puls, PT DPT 01/29/2019, 2:13 PM  [place] [facility] SERVICES [address] [place], [place], [address] Phone: [phone]   Fax:  [phone]  Name: Joshua Everett MRN: [id number] Date of Birth: 06/11/1934

## 2019-01-30 ENCOUNTER — Other Ambulatory Visit: Payer: Self-pay

## 2019-01-30 ENCOUNTER — Ambulatory Visit: Payer: Medicare Other | Admitting: Physical Therapy

## 2019-01-30 ENCOUNTER — Encounter: Payer: Self-pay | Admitting: Physical Therapy

## 2019-01-30 DIAGNOSIS — M6281 Muscle weakness (generalized): Secondary | ICD-10-CM | POA: Diagnosis not present

## 2019-01-30 DIAGNOSIS — R262 Difficulty in walking, not elsewhere classified: Secondary | ICD-10-CM | POA: Diagnosis not present

## 2019-01-30 NOTE — Therapy (Signed)
[place] MAIN [facility] SERVICES [address] [place], [place], [address] Phone: [phone]   Fax:  [phone]  Physical Therapy Treatment  Patient Details  Name: Joshua Everett MRN: [id number] Date of Birth: 05-26-1935 Referring Provider (PT): Jennings Books   Encounter Date: 01/30/2019  PT End of Session - 01/30/19 1405    Visit Number  8    Number of Visits  17    Date for PT Re-Evaluation  02/17/19    PT Start Time  0200    PT Stop Time  0255    PT Time Calculation (min)  55 min    Equipment Utilized During Treatment  Gait belt    Activity Tolerance  Patient tolerated treatment well    Behavior During Therapy  WFL for tasks assessed/performed       Past Medical History:  Diagnosis Date  . BPH without obstruction/lower urinary tract symptoms 09/08/2014  . Chronic constipation 09/08/2014  . GERD (gastroesophageal reflux disease)   . Glaucoma   . History of chicken pox   . Hyperlipidemia   . Obesity, Class I, BMI 30-34.9 09/08/2014  . Prediabetes 07/31/2015  . Rosacea 07/06/2015  . Urine incontinence     Past Surgical History:  Procedure Laterality Date  . COLONOSCOPY  03/2009   1 TA, diverticulosis, rpt 5 yrs (Dr Acquan[facility] in [facility])  . ESOPHAGOGASTRODUODENOSCOPY  03/2009   small HH, gastric polyps biopsied, dilation for dysphagia ([facility])  . HEMORRHOID SURGERY  1976    There were no vitals filed for this visit.  Subjective Assessment - 01/30/19 1348    Subjective  Patient reports that he is doing his exercises.No reports of pain, He had a fall today doing his exercises.    Pertinent History  Patient presents with parkinsons disease. His feet get in his way and he falls against the shower door .He has not used a cane. He wants to be able to stand on one foot and also do a step up exercise. He has higher bed and has some difficulty with getting into the bed.He is working out in the yard and does not think he  is having any coordination or hand. He is having some balance difficulties.    Currently in Pain?  No/denies       Treatment:  Patient seen for LSVT Daily Session Maximal Daily Exercises for facilitation/coordination of movement Sustained movements are designed to rescale the amplitude of movement output for generalization to daily functional activities .Performed as follows for 1 set of 10 repetitions each multidirectional sustained movements  1) Floor to ceiling , cues to hold for 10 seconds,needs modeling for correct positions, VC to reach out further and to reach up to the ceiling 2) Side to side multidirectional Repetitive movements performed in sitting and are designed to provide retraining effort needed for sustained muscle activation in tasks, cues to lean fwd and hold position x 10 counts: Patient does not move fwd<>sidesitting<> to fwd and is not able to transition or stretch out his leg very far 3) Step and reach forward , cues for good knee flex, cues to move UE and LE together, cues to rotate his arms up to pronate his forearms 4) Step and reach backwards , cues for BUE back, getting toe up and bending back knee 5) Step and reach sideways, cues to turn her head sideways, and turn his head back to neutral, and to rotate his arms and pronate forearms  6) Rock and reach forward/backward , cues to reach far fwd with UE's, patient not comfortable with feet apart, not rocking very far to get a good weight shift, not able to raise his heel or raise his toes much 7) Rock and reach sideways, cues for twist and look behind, only rotates side ways, unable to twist around or turn to look behind  functional component task with supervision 5 reps and simulated activities for: 1. Sit to standx 10 needs cues to begin with UE up for correct start position 2.Stepping up to 6 inch stool in sitting<>stool<>floor<>stool<> floor x 5 to mimic getting his feet in and out of the car, needs cues to  really raise his feet up high 3.Practiced stepping up to first step at the stairs from floor, no UE assist, and from purple foam , no UE support with multiple LO 4Bed mobility : supine to sit : Needs cues to bridge and scoot to middle of mat, then cues to roll over, then cues to drop LE's , then cues to push up to sitting 5. Wallet out of pocket x 5 ( he did not wear pants with a back pocket and it was too easy to get it out of his front pocket  Walking with big arm swing x 850 feet, needs cues to swing big  CGA and Max verbal cues used throughout with episodes of LOB most seen with narrow base of support.. Continues to have balance deficits typical with diagnosis.                         PT Education - 01/30/19 1403    Education Details  HEP, LSVT BIG    Person(s) Educated  Patient    Methods  Explanation;Demonstration;Handout    Comprehension  Verbalized understanding;Need further instruction       PT Short Term Goals - 01/13/19 1506      PT SHORT TERM GOAL #1   Title  Patient will be independent in home exercise program to improve strength/mobility for better functional independence with ADLs    Time  2    Period  Weeks    Status  New    Target Date  02/03/19        PT Long Term Goals - 01/13/19 1507      PT LONG TERM GOAL #1   Title  Patient (> 50 years old) will complete five times sit to stand test in < 15 seconds indicating an increased LE strength and improved balance.    Time  4    Period  Weeks    Status  New    Target Date  03/17/19      PT LONG TERM GOAL #2   Title  Patient will increase six minute walk test distance to >1000 for progression to community ambulator and improve gait ability    Time  4    Period  Weeks    Status  New    Target Date  03/17/19      PT LONG TERM GOAL #3   Title  Patient will reduce timed up and go to <11 seconds to reduce fall risk and demonstrate improved transfer/gait ability.    Time  4    Period   Weeks    Status  New    Target Date  03/17/19      PT LONG TERM GOAL #4   Title  Patient will decrease 6 Peg score score  by > 5 points to demonstrate improved coordination of hand  functional activities.    Time  4    Period  Weeks    Status  New    Target Date  03/17/19            Plan - 01/30/19 1405    Clinical Impression Statement  Patient is instructed in LSVT BIG for improving dynamic standing balance and over all mobility. Patient needs modelying and VC to perform the exercises with the correct sequencing and technique. He has severall loss of balance episodes with backwards stepping. He will continue to benefit from skilled PT to improve balance and safety with mobility.    Personal Factors and Comorbidities  Age    Examination-Activity Limitations  Bed Mobility;Transfers;Locomotion Level    Stability/Clinical Decision Making  Stable/Uncomplicated    Rehab Potential  Good    PT Frequency  4x / week    PT Duration  4 weeks    PT Treatment/Interventions  Gait training;Stair training;Functional mobility training;Therapeutic activities;Balance training;Patient/family education;Therapeutic exercise       Patient will benefit from skilled therapeutic intervention in order to improve the following deficits and impairments:  Abnormal gait, Decreased balance, Decreased endurance, Decreased mobility, Difficulty walking, Decreased coordination, Decreased safety awareness, Decreased strength  Visit Diagnosis: Muscle weakness (generalized)  Difficulty in walking, not elsewhere classified     Problem List Patient Active Problem List   Diagnosis Date Noted  . Medicare annual wellness visit, subsequent 01/13/2019  . Parkinson's disease ([place]) 03/12/2018  . Abnormal gait 01/04/2018  . Overactive bladder 05/21/2017  . Chronic pain of right knee 01/01/2017  . Chest pain with moderate risk for cardiac etiology 08/17/2016  . Advanced care planning/counseling discussion 01/23/2016   . Prediabetes 07/31/2015  . Rosacea 07/06/2015  . Personal history of other malignant neoplasm of skin 11/19/2014  . Abdominal wall hernia 09/08/2014  . BPH without obstruction/lower urinary tract symptoms 09/08/2014  . Chronic constipation 09/08/2014  . Dyslipidemia 09/08/2014  . Glaucoma 09/08/2014  . GERD (gastroesophageal reflux disease) 09/08/2014  . Obesity, Class I, BMI 30-34.9 09/08/2014  . Nocturia 11/13/2012    Alanson Puls, PT DPT 01/30/2019, 2:09 PM  [place] MAIN [facility] SERVICES [address] [place], [place], [address] Phone: [phone]   Fax:  [phone]  Name: Joshua Everett MRN: [id number] Date of Birth: 12/25/1934

## 2019-02-03 ENCOUNTER — Ambulatory Visit: Payer: Medicare Other | Admitting: Physical Therapy

## 2019-02-03 ENCOUNTER — Encounter: Payer: Self-pay | Admitting: Physical Therapy

## 2019-02-03 ENCOUNTER — Other Ambulatory Visit: Payer: Self-pay

## 2019-02-03 DIAGNOSIS — M6281 Muscle weakness (generalized): Secondary | ICD-10-CM

## 2019-02-03 DIAGNOSIS — R262 Difficulty in walking, not elsewhere classified: Secondary | ICD-10-CM

## 2019-02-03 NOTE — Therapy (Addendum)
[place] MAIN [facility] SERVICES [address] [place], [place], [address] Phone: [phone]   Fax:  [phone]  Physical Therapy Treatment  Patient Details  Name: Joshua Everett MRN: [id number] Date of Birth: 1935-03-01 Referring Provider (PT): Jennings Books   Encounter Date: 02/03/2019  PT End of Session - 02/03/19 1458    Visit Number  9    Number of Visits  17    Date for PT Re-Evaluation  02/17/19    PT Start Time  0201    PT Stop Time  0247    PT Time Calculation (min)  46 min    Equipment Utilized During Treatment  Gait belt    Activity Tolerance  Patient tolerated treatment well    Behavior During Therapy  WFL for tasks assessed/performed       Past Medical History:  Diagnosis Date  . BPH without obstruction/lower urinary tract symptoms 09/08/2014  . Chronic constipation 09/08/2014  . GERD (gastroesophageal reflux disease)   . Glaucoma   . History of chicken pox   . Hyperlipidemia   . Obesity, Class I, BMI 30-34.9 09/08/2014  . Prediabetes 07/31/2015  . Rosacea 07/06/2015  . Urine incontinence     Past Surgical History:  Procedure Laterality Date  . COLONOSCOPY  03/2009   1 TA, diverticulosis, rpt 5 yrs (Dr [facility] in [facility])  . ESOPHAGOGASTRODUODENOSCOPY  03/2009   small HH, gastric polyps biopsied, dilation for dysphagia ([facility])  . HEMORRHOID SURGERY  1976    There were no vitals filed for this visit.  Subjective Assessment - 02/03/19 1457    Subjective  Patient reports that he is doing his exercises.No reports of pain, He had a fall today doing his exercises.    Pertinent History  Patient presents with parkinsons disease. His feet get in his way and he falls against the shower door .He has not used a cane. He wants to be able to stand on one foot and also do a step up exercise. He has higher bed and has some difficulty with getting into the bed.He is working out in the yard and does not think he  is having any coordination or hand. He is having some balance difficulties.    Currently in Pain?  Yes    Pain Score  3     Pain Location  Knee    Pain Orientation  Right;Left    Pain Descriptors / Indicators  Aching    Pain Onset  More than a month ago    Pain Frequency  Intermittent    Aggravating Factors   walking    Pain Relieving Factors  na    Effect of Pain on Daily Activities  na    Multiple Pain Sites  No         Treatment:  Patient seen for LSVT Daily Session Maximal Daily Exercises for facilitation/coordination of movement Sustained movements are designed to rescale the amplitude of movement output for generalization to daily functional activities .Performed as follows for 1 set of 10 repetitions each multidirectional sustained movements  1) Floor to ceiling , cues to hold for 10 seconds,needs modeling for correct positions, VC to reach out furtherand to reach up to the ceiling 2) Side to side multidirectional Repetitive movements performed in sittingand are designed to provide retraining effort needed for sustained muscle activation in tasks, cues to lean fwd and hold position x 10 counts: Patient does not move fwd<>sidesitting<> to fwd  and is not able to transition or stretch out his leg very far 3) Step and reach forward , cues for good knee flex, cues to move UE and LE together, cues to rotate his arms up to pronate his forearms 4) Step and reach backwards , cues for BUE back, getting toe up and bending back knee 5) Step and reach sideways, cues to turn her head sideways, and turn his head back to neutral, and to rotate his arms and pronate forearms 6) Rock and reach forward/backward , cues to reach far fwd with UE's, patient not comfortable with feet apart, not rocking very far to get a good weight shift, not able to raise his heel or raise his toes much 7) Rock and reach sideways, cues for twist and look behind, only rotates side ways, unable to twist around or turn  to look behind  functional component task with supervision 5 reps and simulated activities for: 1. Sit to standx 10needs cues to begin with UE up for correct start position 2.Stepping up to 6 inch stool in sitting<>stool<>floor<>stool<> floor x 5 to mimic getting his feet in and out of the car, needs cues to really raise his feet up high 3.Practicedstepping up to first step at the stairs from floor, no UE assist, and from purple foam , no UE support with multiple LO, cues to shift his weight to the balls of his feet, and get his balance before he begins to step back down.  Practiced stepping off the front of the 2 inch step fwds x 10 with cues to slow down and get weight shifted over the balls of his feet. 4Bed mobility : supine to sit : Needs cues to bridge and scoot to middle of mat, then cues to roll over, then cues to drop LE's , then cues to push up to sitting 5. Wallet out of pocket x 5 ( he did not wear pants with a back pocket and it was too easy to get it out of his front pocket  Walking with big arm swing x350 feet, needs cues to swing big  CGA and Maxverbal cues used throughout with episodes of LOB most seen with narrow base of support.. Continues to have balance deficits typical with diagnosis.                       PT Education - 02/03/19 1457    Education Details  LSVT BIG    Person(s) Educated  Patient    Methods  Explanation    Comprehension  Verbalized understanding;Returned demonstration;Need further instruction       PT Short Term Goals - 01/13/19 1506      PT SHORT TERM GOAL #1   Title  Patient will be independent in home exercise program to improve strength/mobility for better functional independence with ADLs    Time  2    Period  Weeks    Status  New    Target Date  02/03/19        PT Long Term Goals - 01/13/19 1507      PT LONG TERM GOAL #1   Title  Patient (> 65 years old) will complete five times sit to stand test in <  15 seconds indicating an increased LE strength and improved balance.    Time  4    Period  Weeks    Status  New    Target Date  03/17/19      PT LONG TERM  GOAL #2   Title  Patient will increase six minute walk test distance to >1000 for progression to community ambulator and improve gait ability    Time  4    Period  Weeks    Status  New    Target Date  03/17/19      PT LONG TERM GOAL #3   Title  Patient will reduce timed up and go to <11 seconds to reduce fall risk and demonstrate improved transfer/gait ability.    Time  4    Period  Weeks    Status  New    Target Date  03/17/19      PT LONG TERM GOAL #4   Title  Patient will decrease 6 Peg score score by > 5 points to demonstrate improved coordination of hand  functional activities.    Time  4    Period  Weeks    Status  New    Target Date  03/17/19            Plan - 02/03/19 1459    Clinical Impression Statement  Patient demonstrates LOB with standing balance exercises indicating decreased balancing strategies. Patient did require UE support to perform sidestepping with YTB.  Patient will benefit from further skilled therapy to improve strength BLE, improve mobility and gait.   Pt would continue to benefit from skilled therapy services in order to further address LE strength deficits and balance deficits in order to decrease fall risk and improve mobility    Personal Factors and Comorbidities  Age    Examination-Activity Limitations  Bed Mobility;Transfers;Locomotion Level    Stability/Clinical Decision Making  Stable/Uncomplicated    Rehab Potential  Good    PT Frequency  4x / week    PT Duration  4 weeks    PT Treatment/Interventions  Gait training;Stair training;Functional mobility training;Therapeutic activities;Balance training;Patient/family education;Therapeutic exercise       Patient will benefit from skilled therapeutic intervention in order to improve the following deficits and impairments:  Abnormal gait,  Decreased balance, Decreased endurance, Decreased mobility, Difficulty walking, Decreased coordination, Decreased safety awareness, Decreased strength  Visit Diagnosis: Muscle weakness (generalized)  Difficulty in walking, not elsewhere classified     Problem List Patient Active Problem List   Diagnosis Date Noted  . Medicare annual wellness visit, subsequent 01/13/2019  . Parkinson's disease ([place]) 03/12/2018  . Abnormal gait 01/04/2018  . Overactive bladder 05/21/2017  . Chronic pain of right knee 01/01/2017  . Chest pain with moderate risk for cardiac etiology 08/17/2016  . Advanced care planning/counseling discussion 01/23/2016  . Prediabetes 07/31/2015  . Rosacea 07/06/2015  . Personal history of other malignant neoplasm of skin 11/19/2014  . Abdominal wall hernia 09/08/2014  . BPH without obstruction/lower urinary tract symptoms 09/08/2014  . Chronic constipation 09/08/2014  . Dyslipidemia 09/08/2014  . Glaucoma 09/08/2014  . GERD (gastroesophageal reflux disease) 09/08/2014  . Obesity, Class I, BMI 30-34.9 09/08/2014  . Nocturia 11/13/2012    Alanson Puls, PT DPT 02/03/2019, 3:00 PM  [place] MAIN [facility] SERVICES [address] [place], [place], [address] Phone: [phone]   Fax:  [phone]  Name: Joshua Everett MRN: [id number] Date of Birth: 25-Apr-1935

## 2019-02-04 ENCOUNTER — Ambulatory Visit: Payer: Medicare Other | Admitting: Physical Therapy

## 2019-02-04 DIAGNOSIS — R270 Ataxia, unspecified: Secondary | ICD-10-CM | POA: Diagnosis not present

## 2019-02-04 DIAGNOSIS — R251 Tremor, unspecified: Secondary | ICD-10-CM | POA: Diagnosis not present

## 2019-02-04 DIAGNOSIS — Z23 Encounter for immunization: Secondary | ICD-10-CM | POA: Diagnosis not present

## 2019-02-04 DIAGNOSIS — G20 Parkinson's disease: Secondary | ICD-10-CM | POA: Diagnosis not present

## 2019-02-04 DIAGNOSIS — K117 Disturbances of salivary secretion: Secondary | ICD-10-CM | POA: Diagnosis not present

## 2019-02-05 ENCOUNTER — Other Ambulatory Visit: Payer: Self-pay

## 2019-02-05 ENCOUNTER — Encounter: Payer: Self-pay | Admitting: Physical Therapy

## 2019-02-05 ENCOUNTER — Ambulatory Visit: Payer: Medicare Other | Attending: Neurology | Admitting: Physical Therapy

## 2019-02-05 DIAGNOSIS — R262 Difficulty in walking, not elsewhere classified: Secondary | ICD-10-CM | POA: Diagnosis not present

## 2019-02-05 DIAGNOSIS — M6281 Muscle weakness (generalized): Secondary | ICD-10-CM | POA: Insufficient documentation

## 2019-02-05 NOTE — Therapy (Addendum)
[place] MAIN [facility] SERVICES [address] [place], [place], [address] Phone: [phone]   Fax:  [phone]  Physical Therapy Treatment  Patient Details  Name: Joshua Everett MRN: [id number] Date of Birth: 10-05-1934 Referring Provider (PT): Jennings Books   Encounter Date: 02/05/2019  PT End of Session - 02/05/19 1400    Visit Number  10   Number of Visits  17    Date for PT Re-Evaluation  02/17/19    PT Start Time  0155    PT Stop Time  0249    PT Time Calculation (min)  54 min    Equipment Utilized During Treatment  Gait belt    Activity Tolerance  Patient tolerated treatment well    Behavior During Therapy  WFL for tasks assessed/performed       Past Medical History:  Diagnosis Date  . BPH without obstruction/lower urinary tract symptoms 09/08/2014  . Chronic constipation 09/08/2014  . GERD (gastroesophageal reflux disease)   . Glaucoma   . History of chicken pox   . Hyperlipidemia   . Obesity, Class I, BMI 30-34.9 09/08/2014  . Prediabetes 07/31/2015  . Rosacea 07/06/2015  . Urine incontinence     Past Surgical History:  Procedure Laterality Date  . COLONOSCOPY  03/2009   1 TA, diverticulosis, rpt 5 yrs (Dr Acquan[facility] in [facility])  . ESOPHAGOGASTRODUODENOSCOPY  03/2009   small HH, gastric polyps biopsied, dilation for dysphagia ([facility] [place])  . HEMORRHOID SURGERY  1976    There were no vitals filed for this visit.  Subjective Assessment - 02/05/19 1359    Subjective  Patient reports that he is doing his exercises.No reports of pain, He had a fall today doing his exercises.    Pertinent History  Patient presents with parkinsons disease. His feet get in his way and he falls against the shower door .He has not used a cane. He wants to be able to stand on one foot and also do a step up exercise. He has higher bed and has some difficulty with getting into the bed.He is working out in the yard and does not think he is  having any coordination or hand. He is having some balance difficulties.    Currently in Pain?  No/denies    Pain Score  0-No pain    Pain Onset  More than a month ago         Treatment:  Patient seen for LSVT Daily Session Maximal Daily Exercises for facilitation/coordination of movement Sustained movements are designed to rescale the amplitude of movement output for generalization to daily functional activities .Performed as follows for 1 set of 10 repetitions each multidirectional sustained movements  1) Floor to ceiling , cues to hold for 10 seconds,needs modeling for correct positions, VC to reach out furtherand to reach up to the ceiling 2) Side to side multidirectional Repetitive movements performed in sittingand are designed to provide retraining effort needed for sustained muscle activation in tasks, cues to lean fwd and hold position x 10 counts: Patient does not move fwd<>sidesitting<> to fwd and is not able to transition or stretch out his leg very far 3) Step and reach forward , cues for good knee flex, cues to move UE and LE together, cues to rotate his arms up to pronate his forearms 4) Step and reach backwards , cues for BUE back, getting toe up and bending back knee 5) Step and reach sideways, cues to turn  her head sideways, and turn his head back to neutral, and to rotate his arms and pronate forearms 6) Rock and reach forward/backward , cues to reach far fwd with UE's, patient not comfortable with feet apart, not rocking very far to get a good weight shift, not able to raise his heel or raise his toes much 7) Rock and reach sideways, cues for twist and look behind, only rotates side ways, unable to twist around or turn to look behind  functional component task with supervision 5 reps and simulated activities for: 1. Sit to standx 10needs cues to begin with UE up for correct start position 2.Stepping up to 6 inch stool in sitting<>stool<>floor<>stool<> floor x 5  to mimic getting his feet in and out of the car, needs cues to really raise his feet up high 3.Practicedstepping up to first step at the stairs from floor, no UE assist, and from purple foam , no UE support with multiple LO 4Bed mobility : supine to sit : Needs cues to bridge and scoot to middle of mat, then cues to roll over, then cues to drop LE's , then cues to push up to sitting 5. Wallet out of pocket x 5  Added heel raises x 10 , heel rocks in parallel bars x 10 Walking with big arm swing x 850 feet, needs cues to swing big  CGA and Maxverbal cues used throughout with episodes of LOB most seen with narrow base of support.. Continues to have balance deficits typical with diagnosis.                       PT Education - 02/05/19 1359    Education Details  LSVT BIG    Person(s) Educated  Patient    Methods  Explanation;Demonstration;Tactile cues;Verbal cues    Comprehension  Verbalized understanding;Returned demonstration;Tactile cues required       PT Short Term Goals - 01/13/19 1506      PT SHORT TERM GOAL #1   Title  Patient will be independent in home exercise program to improve strength/mobility for better functional independence with ADLs    Time  2    Period  Weeks    Status  New    Target Date  02/03/19        PT Long Term Goals - 01/13/19 1507      PT LONG TERM GOAL #1   Title  Patient (> 9 years old) will complete five times sit to stand test in < 15 seconds indicating an increased LE strength and improved balance.    Time  4    Period  Weeks    Status  New    Target Date  03/17/19      PT LONG TERM GOAL #2   Title  Patient will increase six minute walk test distance to >1000 for progression to community ambulator and improve gait ability    Time  4    Period  Weeks    Status  New    Target Date  03/17/19      PT LONG TERM GOAL #3   Title  Patient will reduce timed up and go to <11 seconds to reduce fall risk and demonstrate  improved transfer/gait ability.    Time  4    Period  Weeks    Status  New    Target Date  03/17/19      PT LONG TERM GOAL #4   Title  Patient will  decrease 6 Peg score score by > 5 points to demonstrate improved coordination of hand  functional activities.    Time  4    Period  Weeks    Status  New    Target Date  03/17/19            Plan - 02/05/19 1402    Clinical Impression Statement  Patient performs LSVT BIG exercises and needs modelying and  VC for correct positioning for start and finish positions. He is able to improve after practice and several repetitions. He will continue to benefit from skiled PT to improve safety and mobility.    Personal Factors and Comorbidities  Age    Examination-Activity Limitations  Bed Mobility;Transfers;Locomotion Level    Stability/Clinical Decision Making  Stable/Uncomplicated    Rehab Potential  Good    PT Frequency  4x / week    PT Duration  4 weeks    PT Treatment/Interventions  Gait training;Stair training;Functional mobility training;Therapeutic activities;Balance training;Patient/family education;Therapeutic exercise       Patient will benefit from skilled therapeutic intervention in order to improve the following deficits and impairments:  Abnormal gait, Decreased balance, Decreased endurance, Decreased mobility, Difficulty walking, Decreased coordination, Decreased safety awareness, Decreased strength  Visit Diagnosis: Difficulty in walking, not elsewhere classified  Muscle weakness (generalized)     Problem List Patient Active Problem List   Diagnosis Date Noted  . Medicare annual wellness visit, subsequent 01/13/2019  . Parkinson's disease ([place]) 03/12/2018  . Abnormal gait 01/04/2018  . Overactive bladder 05/21/2017  . Chronic pain of right knee 01/01/2017  . Chest pain with moderate risk for cardiac etiology 08/17/2016  . Advanced care planning/counseling discussion 01/23/2016  . Prediabetes 07/31/2015  . Rosacea  07/06/2015  . Personal history of other malignant neoplasm of skin 11/19/2014  . Abdominal wall hernia 09/08/2014  . BPH without obstruction/lower urinary tract symptoms 09/08/2014  . Chronic constipation 09/08/2014  . Dyslipidemia 09/08/2014  . Glaucoma 09/08/2014  . GERD (gastroesophageal reflux disease) 09/08/2014  . Obesity, Class I, BMI 30-34.9 09/08/2014  . Nocturia 11/13/2012    Alanson Puls, PT DPT 02/06/2019, 1:54 PM  [place] [facility] MAIN [facility] SERVICES [address] [place], [place], [address] Phone: [phone]   Fax:  [phone]  Name: Joshua Everett MRN: [id number] Date of Birth: 04-02-1935

## 2019-02-06 ENCOUNTER — Ambulatory Visit: Payer: Medicare Other | Admitting: Physical Therapy

## 2019-02-06 DIAGNOSIS — N3941 Urge incontinence: Secondary | ICD-10-CM | POA: Diagnosis not present

## 2019-02-11 ENCOUNTER — Other Ambulatory Visit: Payer: Self-pay

## 2019-02-11 ENCOUNTER — Encounter: Payer: Self-pay | Admitting: Physical Therapy

## 2019-02-11 ENCOUNTER — Ambulatory Visit: Payer: Medicare Other | Admitting: Physical Therapy

## 2019-02-11 DIAGNOSIS — M6281 Muscle weakness (generalized): Secondary | ICD-10-CM

## 2019-02-11 DIAGNOSIS — R262 Difficulty in walking, not elsewhere classified: Secondary | ICD-10-CM | POA: Diagnosis not present

## 2019-02-11 NOTE — Therapy (Addendum)
[place] MAIN [facility] SERVICES [address] [place], [place], [address] Phone: [phone]   Fax:  [phone]  Physical Therapy Treatment  Patient Details  Name: Joshua Everett MRN: [id number] Date of Birth: November 20, 1934 Referring Provider (PT): Jennings Books   Encounter Date: 02/11/2019  PT End of Session - 02/11/19 1425    Visit Number  11   Number of Visits  17    Date for PT Re-Evaluation  02/17/19    PT Start Time  0200    PT Stop Time  0300    PT Time Calculation (min)  60 min    Equipment Utilized During Treatment  Gait belt    Activity Tolerance  Patient tolerated treatment well    Behavior During Therapy  WFL for tasks assessed/performed       Past Medical History:  Diagnosis Date  . BPH without obstruction/lower urinary tract symptoms 09/08/2014  . Chronic constipation 09/08/2014  . GERD (gastroesophageal reflux disease)   . Glaucoma   . History of chicken pox   . Hyperlipidemia   . Obesity, Class I, BMI 30-34.9 09/08/2014  . Prediabetes 07/31/2015  . Rosacea 07/06/2015  . Urine incontinence     Past Surgical History:  Procedure Laterality Date  . COLONOSCOPY  03/2009   1 TA, diverticulosis, rpt 5 yrs (Dr [facility] in [facility])  . ESOPHAGOGASTRODUODENOSCOPY  03/2009   small HH, gastric polyps biopsied, dilation for dysphagia ([facility] [place] [facility])  . HEMORRHOID SURGERY  1976    There were no vitals filed for this visit.  Subjective Assessment - 02/11/19 1424    Subjective  Patient reports that he is doing his exercises.No reports of pain, He had a fall today doing his exercises.    Pertinent History  Patient presents with parkinsons disease. His feet get in his way and he falls against the shower door .He has not used a cane. He wants to be able to stand on one foot and also do a step up exercise. He has higher bed and has some difficulty with getting into the bed.He is working out in the yard and does not think he is  having any coordination or hand. He is having some balance difficulties.    Currently in Pain?  No/denies    Pain Score  0-No pain    Pain Onset  More than a month ago         Treatment:  Patient seen for LSVT Daily Session Maximal Daily Exercises for facilitation/coordination of movement Sustained movements are designed to rescale the amplitude of movement output for generalization to daily functional activities .Performed as follows for 1 set of 10 repetitions each multidirectional sustained movements  1) Floor to ceiling , cues to hold for 10 seconds,needs modeling for correct positions, VC to reach out furtherand to reach up to the ceiling 2) Side to side multidirectional Repetitive movements performed in sittingand are designed to provide retraining effort needed for sustained muscle activation in tasks, cues to lean fwd and hold position x 10 counts: Patient does not move fwd<>sidesitting<> to fwd and is not able to transition or stretch out his leg very far 3) Step and reach forward , cues for good knee flex, cues to move UE and LE together, cues to rotate his arms up to pronate his forearms 4) Step and reach backwards , cues for BUE back, getting toe up and bending back knee 5) Step and reach sideways, cues to turn  her head sideways, and turn his head back to neutral, and to rotate his arms and pronate forearms 6) Rock and reach forward/backward , cues to reach far fwd with UE's, patient not comfortable with feet apart, not rocking very far to get a good weight shift, not able to raise his heel or raise his toes much 7) Rock and reach sideways, cues for twist and look behind, only rotates side ways, unable to twist around or turn to look behind  functional component task with supervision 5 reps and simulated activities for: 1. Sit to standx 10needs cues to begin with UE up for correct start position 2.Stepping up to 6 inch stool in sitting<>stool<>floor<>stool<> floor x 5  to mimic getting his feet in and out of the car, needs cues to really raise his feet up high 3.Practicedstepping up to first step at the stairs from floor, no UE assist, and from purple foam , no UE support with multiple LO 4Bed mobility : time constraints - not done today 5. Wallet out of pocket x 5  Added heel raises x 10 , heel rocks in parallel bars x 10 Walking with big arm swing x>1026feet, needs cues to swing big  CGA and Modverbal cues used throughout withepisodes ofLOB most seen with narrow base of support.. Continues to have balance deficits typical with diagnosis                       PT Education - 02/11/19 1424    Education Details  HEP    Person(s) Educated  Patient    Methods  Explanation    Comprehension  Verbalized understanding;Tactile cues required       PT Short Term Goals - 01/13/19 1506      PT SHORT TERM GOAL #1   Title  Patient will be independent in home exercise program to improve strength/mobility for better functional independence with ADLs    Time  2    Period  Weeks    Status  New    Target Date  02/03/19        PT Long Term Goals - 01/13/19 1507      PT LONG TERM GOAL #1   Title  Patient (> 83 years old) will complete five times sit to stand test in < 15 seconds indicating an increased LE strength and improved balance.    Time  4    Period  Weeks    Status  New    Target Date  03/17/19      PT LONG TERM GOAL #2   Title  Patient will increase six minute walk test distance to >1000 for progression to community ambulator and improve gait ability    Time  4    Period  Weeks    Status  New    Target Date  03/17/19      PT LONG TERM GOAL #3   Title  Patient will reduce timed up and go to <11 seconds to reduce fall risk and demonstrate improved transfer/gait ability.    Time  4    Period  Weeks    Status  New    Target Date  03/17/19      PT LONG TERM GOAL #4   Title  Patient will decrease 6 Peg score score  by > 5 points to demonstrate improved coordination of hand  functional activities.    Time  4    Period  Weeks    Status  New    Target Date  03/17/19            Plan - 02/11/19 1426    Clinical Impression Statement  Patplannient has difficulty with motor planning and coordination.and balance in standing. Patient performs LSVT BIG with intemittent modelying and cueing to complete correctly. Patient will continue to benefit from skilled PT to improve mobility and safety.    Personal Factors and Comorbidities  Age    Examination-Activity Limitations  Bed Mobility;Transfers;Locomotion Level    Stability/Clinical Decision Making  Stable/Uncomplicated    Rehab Potential  Good    PT Frequency  4x / week    PT Duration  4 weeks    PT Treatment/Interventions  Gait training;Stair training;Functional mobility training;Therapeutic activities;Balance training;Patient/family education;Therapeutic exercise       Patient will benefit from skilled therapeutic intervention in order to improve the following deficits and impairments:  Abnormal gait, Decreased balance, Decreased endurance, Decreased mobility, Difficulty walking, Decreased coordination, Decreased safety awareness, Decreased strength  Visit Diagnosis: Difficulty in walking, not elsewhere classified  Muscle weakness (generalized)     Problem List Patient Active Problem List   Diagnosis Date Noted  . Medicare annual wellness visit, subsequent 01/13/2019  . Parkinson's disease ([place]) 03/12/2018  . Abnormal gait 01/04/2018  . Overactive bladder 05/21/2017  . Chronic pain of right knee 01/01/2017  . Chest pain with moderate risk for cardiac etiology 08/17/2016  . Advanced care planning/counseling discussion 01/23/2016  . Prediabetes 07/31/2015  . Rosacea 07/06/2015  . Personal history of other malignant neoplasm of skin 11/19/2014  . Abdominal wall hernia 09/08/2014  . BPH without obstruction/lower urinary tract symptoms  09/08/2014  . Chronic constipation 09/08/2014  . Dyslipidemia 09/08/2014  . Glaucoma 09/08/2014  . GERD (gastroesophageal reflux disease) 09/08/2014  . Obesity, Class I, BMI 30-34.9 09/08/2014  . Nocturia 11/13/2012    Alanson Puls, PT DPT 02/11/2019, 2:42 PM  [place] MAIN [facility] SERVICES [address] [place], [place], [address] Phone: [phone]   Fax:  [phone]  Name: Joshua Everett MRN: [id number] Date of Birth: 1934/08/13

## 2019-02-12 ENCOUNTER — Encounter: Payer: Self-pay | Admitting: Physical Therapy

## 2019-02-12 ENCOUNTER — Ambulatory Visit: Payer: Medicare Other | Admitting: Physical Therapy

## 2019-02-12 ENCOUNTER — Other Ambulatory Visit: Payer: Self-pay

## 2019-02-12 DIAGNOSIS — M6281 Muscle weakness (generalized): Secondary | ICD-10-CM

## 2019-02-12 DIAGNOSIS — R262 Difficulty in walking, not elsewhere classified: Secondary | ICD-10-CM

## 2019-02-12 NOTE — Therapy (Addendum)
[place] MAIN [facility] SERVICES [address] [place], [place], [address] Phone: [phone]   Fax:  [phone]  Physical Therapy Treatment  Patient Details  Name: Joshua Everett MRN: [id number] Date of Birth: 01-27-1935 Referring Provider (PT): Jennings Books   Encounter Date: 02/12/2019  PT End of Session - 02/12/19 1409    Visit Number  12   Number of Visits  17    Date for PT Re-Evaluation  02/17/19    PT Start Time  0145    PT Stop Time  0240    PT Time Calculation (min)  55 min    Equipment Utilized During Treatment  Gait belt    Activity Tolerance  Patient tolerated treatment well    Behavior During Therapy  WFL for tasks assessed/performed       Past Medical History:  Diagnosis Date  . BPH without obstruction/lower urinary tract symptoms 09/08/2014  . Chronic constipation 09/08/2014  . GERD (gastroesophageal reflux disease)   . Glaucoma   . History of chicken pox   . Hyperlipidemia   . Obesity, Class I, BMI 30-34.9 09/08/2014  . Prediabetes 07/31/2015  . Rosacea 07/06/2015  . Urine incontinence     Past Surgical History:  Procedure Laterality Date  . COLONOSCOPY  03/2009   1 TA, diverticulosis, rpt 5 yrs (Dr [facility] in [facility])  . ESOPHAGOGASTRODUODENOSCOPY  03/2009   small HH, gastric polyps biopsied, dilation for dysphagia ([facility] [place])  . HEMORRHOID SURGERY  1976    There were no vitals filed for this visit.  Subjective Assessment - 02/12/19 1408    Subjective  Patient reports that he is doing his exercises.No reports of pain, He had a fall today doing his exercises.    Pertinent History  Patient presents with parkinsons disease. His feet get in his way and he falls against the shower door .He has not used a cane. He wants to be able to stand on one foot and also do a step up exercise. He has higher bed and has some difficulty with getting into the bed.He is working out in the yard and does not think he is  having any coordination or hand. He is having some balance difficulties.    Currently in Pain?  No/denies    Pain Score  0-No pain    Pain Onset  More than a month ago       Treatment:  Patient seen for LSVT Daily Session Maximal Daily Exercises for facilitation/coordination of movement Sustained movements are designed to rescale the amplitude of movement output for generalization to daily functional activities .Performed as follows for 1 set of 10 repetitions each multidirectional sustained movements  1) Floor to ceiling , cues to hold for 10 seconds,needs modeling for correct positions, VC to reach out furtherand to reach up to the ceiling 2) Side to side multidirectional Repetitive movements performed in sittingand are designed to provide retraining effort needed for sustained muscle activation in tasks, cues to lean fwd and hold position x 10 counts: Patient does not move fwd<>sidesitting<> to fwd and is not able to transition or stretch out his leg very far 3) Step and reach forward , cues for good knee flex, cues to move UE and LE together, cues to rotate his arms up to pronate his forearms 4) Step and reach backwards , cues for BUE back, getting toe up and bending back knee 5) Step and reach sideways, cues to turn her head  sideways, and turn his head back to neutral, and to rotate his arms and pronate forearms 6) Rock and reach forward/backward , cues to reach far fwd with UE's, patient not comfortable with feet apart, not rocking very far to get a good weight shift, not able to raise his heel or raise his toes much 7) Rock and reach sideways, cues for twist and look behind, only rotates side ways, unable to twist around or turn to look behind  functional component task with supervision 5 reps and simulated activities for: 1. Sit to standx 10needs cues to begin with UE up for correct start position 2.Stepping up to 6 inch stool in sitting<>stool<>floor<>stool<> floor x 5 to  mimic getting his feet in and out of the car, needs cues to really raise his feet up high 3.Practicedstepping up to first step at the stairs from floor, no UE assist, and from purple foam , no UE support with multiple LO 4Bed mobility : time constraints - not done today 5. Wallet out of pocket x 5  Added heel raises x 10 , heel rocks in parallel bars x 10 Walking with big arm swing x>1043feet, needs cues to swing big  CGA and Modverbal cues used throughout withepisodes ofLOB most seen with narrow base of support.. Continues to have balance deficits typical with diagnosis                            PT Education - 02/12/19 1408    Education Details  LSVT BIG    Person(s) Educated  Patient    Methods  Explanation    Comprehension  Verbalized understanding;Returned demonstration;Tactile cues required;Need further instruction       PT Short Term Goals - 01/13/19 1506      PT SHORT TERM GOAL #1   Title  Patient will be independent in home exercise program to improve strength/mobility for better functional independence with ADLs    Time  2    Period  Weeks    Status  New    Target Date  02/03/19        PT Long Term Goals - 01/13/19 1507      PT LONG TERM GOAL #1   Title  Patient (> 82 years old) will complete five times sit to stand test in < 15 seconds indicating an increased LE strength and improved balance.    Time  4    Period  Weeks    Status  New    Target Date  03/17/19      PT LONG TERM GOAL #2   Title  Patient will increase six minute walk test distance to >1000 for progression to community ambulator and improve gait ability    Time  4    Period  Weeks    Status  New    Target Date  03/17/19      PT LONG TERM GOAL #3   Title  Patient will reduce timed up and go to <11 seconds to reduce fall risk and demonstrate improved transfer/gait ability.    Time  4    Period  Weeks    Status  New    Target Date  03/17/19      PT LONG  TERM GOAL #4   Title  Patient will decrease 6 Peg score score by > 5 points to demonstrate improved coordination of hand  functional activities.    Time  4  Period  Weeks    Status  New    Target Date  03/17/19            Plan - 02/12/19 1409    Clinical Impression Statement  Patient performs LSVT BIG exercises with intermittent modelying and VC and intermittent TC for correct start and finish positions. He is doing better with coordination and motor control and static standing balance. He will continue to benefit from skilled PT to improve mobility and saftey.    Personal Factors and Comorbidities  Age    Examination-Activity Limitations  Bed Mobility;Transfers;Locomotion Level    Stability/Clinical Decision Making  Stable/Uncomplicated    Rehab Potential  Good    PT Frequency  4x / week    PT Duration  4 weeks    PT Treatment/Interventions  Gait training;Stair training;Functional mobility training;Therapeutic activities;Balance training;Patient/family education;Therapeutic exercise       Patient will benefit from skilled therapeutic intervention in order to improve the following deficits and impairments:  Abnormal gait, Decreased balance, Decreased endurance, Decreased mobility, Difficulty walking, Decreased coordination, Decreased safety awareness, Decreased strength  Visit Diagnosis: Difficulty in walking, not elsewhere classified  Muscle weakness (generalized)     Problem List Patient Active Problem List   Diagnosis Date Noted  . Medicare annual wellness visit, subsequent 01/13/2019  . Parkinson's disease ([place]) 03/12/2018  . Abnormal gait 01/04/2018  . Overactive bladder 05/21/2017  . Chronic pain of right knee 01/01/2017  . Chest pain with moderate risk for cardiac etiology 08/17/2016  . Advanced care planning/counseling discussion 01/23/2016  . Prediabetes 07/31/2015  . Rosacea 07/06/2015  . Personal history of other malignant neoplasm of skin 11/19/2014  .  Abdominal wall hernia 09/08/2014  . BPH without obstruction/lower urinary tract symptoms 09/08/2014  . Chronic constipation 09/08/2014  . Dyslipidemia 09/08/2014  . Glaucoma 09/08/2014  . GERD (gastroesophageal reflux disease) 09/08/2014  . Obesity, Class I, BMI 30-34.9 09/08/2014  . Nocturia 11/13/2012    Alanson Puls, PT DPT 02/12/2019, 2:16 PM  [place] MAIN [facility] SERVICES [address] [place], [place], [address] Phone: [phone]   Fax:  [phone]  Name: Teoman Zupon MRN: [id number] Date of Birth: 11/10/1934

## 2019-02-13 ENCOUNTER — Other Ambulatory Visit: Payer: Self-pay

## 2019-02-13 ENCOUNTER — Encounter: Payer: Self-pay | Admitting: Physical Therapy

## 2019-02-13 ENCOUNTER — Ambulatory Visit: Payer: Medicare Other | Admitting: Physical Therapy

## 2019-02-13 DIAGNOSIS — M6281 Muscle weakness (generalized): Secondary | ICD-10-CM | POA: Diagnosis not present

## 2019-02-13 DIAGNOSIS — R262 Difficulty in walking, not elsewhere classified: Secondary | ICD-10-CM

## 2019-02-13 NOTE — Therapy (Addendum)
[place] [facility] MA[facility] REHAB SERVICES [address] [place], [place], [address] Phone: [phone]   Fax:  [phone]  Physical Therapy Treatment  Patient Details  Name: Joshua Everett MRN: [id number] Date of Birth: 11/25/1934 Referring Provider (PT): Shah, Hemang   Encounter Date: 02/13/2019  PT End of Session - 02/13/19 1450    Visit Number  13    Number of Visits  17    Date for PT Re-Evaluation  02/17/19    PT Start Time  1355    PT Stop Time  1450    PT Time Calculation (min)  55 min    Equipment Utilized During Treatment  Gait belt    Activity Tolerance  Patient tolerated treatment well    Behavior During Therapy  WFL for tasks assessed/performed       Past Medical History:  Diagnosis Date  . BPH without obstruction/lower urinary tract symptoms 09/08/2014  . Chronic constipation 09/08/2014  . GERD (gastroesophageal reflux disease)   . Glaucoma   . History of chicken pox   . Hyperlipidemia   . Obesity, Class I, BMI 30-34.9 09/08/2014  . Prediabetes 07/31/2015  . Rosacea 07/06/2015  . Urine incontinence     Past Surgical History:  Procedure Laterality Date  . COLONOSCOPY  03/2009   1 TA, diverticulosis, rpt 5 yrs (Dr [facility] in [place])  . ESOPHAGOGASTRODUODENOSCOPY  03/2009   small HH, gastric polyps biopsied, dilation for dysphagia ([facility] [place])  . HEMORRHOID SURGERY  1976    There were no vitals filed for this visit.  Subjective Assessment - 02/13/19 1346    Subjective  Patient reports that he is doing his exercises.No reports of pain, some fatigue today following yesterday's session.    Pertinent History  Patient presents with parkinsons disease. His feet get in his way and he falls against the shower door .He has not used a cane. He wants to be able to stand on one foot and also do a step up exercise. He has higher bed and has some difficulty with getting into the bed.He is working out in the yard and does not  think he is having any coordination or hand. He is having some balance difficulties.    Currently in Pain?  No/denies    Pain Score  0-No pain    Pain Onset  More than a month ago       This entire session was performed under direct supervision and direction of a licensed therapist/therapist assistant . I have personally read, edited and approve of the note as written.   Treatment:  Patient seen for LSVT Daily Session Maximal Daily Exercises for facilitation/coordination of movement Sustained movements are designed to rescale the amplitude of movement output for generalization to daily functional activities .Performed as follows for 1 set of 10 repetitions each multidirectional sustained movements  1) Floor to ceiling , cues to hold for 10 seconds,needs modeling for correct positions, VC to reach out furtherand to reach up to the ceiling 2) Side to side multidirectional Repetitive movements performed in sittingand are designed to provide retraining effort needed for sustained muscle activation in tasks, cues to lean fwd and hold position x 10 counts: Patient does not move fwd<>sidesitting<> to fwd and is not able to transition or stretch out his leg very far 3) Step and reach forward , cues for good knee flex, cues to move UE and LE together, cues to rotate his arms up to pronate   his forearms 4) Step and reach backwards , cues for BUE back, getting toe up and bending back knee 5) Step and reach sideways, cues to turn her head sideways, and turn his head back to neutral, and to rotate his arms and pronate forearms 6) Rock and reach forward/backward , cues to reach far fwd with UE's, patient not comfortable with feet apart, not rocking very far to get a good weight shift, not able to raise his heel or raise his toes much 7) Rock and reach sideways, cues for twist and look behind, only rotates side ways, unable to twist around or turn to look behind  functional component task with  supervision 5 reps and simulated activities for: 1. Sit to standx 10needs cues to begin with UE up for correct start position 2.Stepping up to 6 inch stool in sitting<>stool<>floor<>stool<> floor x 5 to mimic getting his feet in and out of the car, needs cues to really raise his feet up high 3.Practicedstepping up to first step at the stairs from floor, no UE assist, and from purple foam , no UE support with multiple LO 4Bed mobility :time constraints - not done today 5. Wallet out of pocket x 5     Patient instructed in outcome measures for measuring goals, see below:   OUTCOME MEASURES: TEST Outcome (eval) 02/13/19 Interpretation  5 times sit<>stand 16.21sec 16.89 sec No significant changes from eval. >60 yo, >15 sec indicates increased risk for falls  10 meter walk test   .96              m/s 1.05 m/s <1.0 m/s indicates increased risk for falls; limited community ambulator  Timed up and Go   11.96, carry 13.04, cog 11.61              sec 10.38 sec TUG; 12.21 sec carry; 9.31 sec cognitive  <14 sec indicates increased risk for falls  6 minute walk test 910               Feet 1125 ft Significant improvement from eval. 1000 feet is community ambulator       9 Hole Peg Test L: 30.46               R: 34.52  26 sec is normal for male 84 yr old       Response to Treatment:   CGA and Modverbal cues used throughout withepisodes ofLOB most seen with narrow base of support. Continues to have balance deficits typical with diagnosis.  Patient's condition has the potential to improve in response to therapy. Maximum improvement is yet to be obtained. The anticipated improvement is attainable and reasonable in a generally predictable time. Patient demonstrated improvements in 6 minute walk test and 10 meter walk test, indicating increased gait speed and endurance.                       PT Education - 02/13/19 1346    Education Details  LSVT BIG    Person(s)  Educated  Patient    Methods  Explanation;Demonstration;Tactile cues;Verbal cues;Handout    Comprehension  Verbalized understanding;Returned demonstration;Need further instruction       PT Short Term Goals - 02/13/19 1555      PT SHORT TERM GOAL #1   Title  Patient will be independent in home exercise program to improve strength/mobility for better functional independence with ADLs    Time  2    Period  Weeks      Status  Partially Met    Target Date  02/03/19        PT Long Term Goals - 02/13/19 1520      PT LONG TERM GOAL #1   Title  Patient (> 60 years old) will complete five times sit to stand test in < 15 seconds indicating an increased LE strength and improved balance.    Baseline  9/10 16.89 sec 5xSTS    Time  4    Period  Weeks    Status  Partially Met    Target Date  03/17/19      PT LONG TERM GOAL #2   Title  Patient will increase six minute walk test distance to >1000 for progression to community ambulator and improve gait ability    Baseline  9/10 1125 ft 6MWT    Time  4    Period  Weeks    Status  Achieved    Target Date  03/17/19      PT LONG TERM GOAL #3   Title  Patient will reduce timed up and go to <11 seconds to reduce fall risk and demonstrate improved transfer/gait ability.    Baseline  9/10 10.38 sec TUG; 12.21 sec carry; 9.31 sec cognitive    Time  4    Period  Weeks    Status  Partially Met    Target Date  03/17/19      PT LONG TERM GOAL #4   Title  Patient will decrease 6 Peg score score by > 5 points to demonstrate improved coordination of hand  functional activities.    Baseline  not tested today due to time constraints    Time  4    Period  Weeks    Status  On-going    Target Date  03/17/19            Plan - 02/13/19 1504    Clinical Impression Statement  Patient demonstrates improved adherence to LSVT BIG exercise mechanics, requiring periodic verbal and tactile cues for correct start and finish positions. His enddurance and  cognitive multitasking abilities are improving. Patient will continue to benefit from skilled PT to improve mobility and safety.    Personal Factors and Comorbidities  Age    Examination-Activity Limitations  Bed Mobility;Transfers;Locomotion Level    Stability/Clinical Decision Making  Stable/Uncomplicated    Rehab Potential  Good    PT Frequency  4x / week    PT Duration  4 weeks    PT Treatment/Interventions  Gait training;Stair training;Functional mobility training;Therapeutic activities;Balance training;Patient/family education;Therapeutic exercise       Patient will benefit from skilled therapeutic intervention in order to improve the following deficits and impairments:  Abnormal gait, Decreased balance, Decreased endurance, Decreased mobility, Difficulty walking, Decreased coordination, Decreased safety awareness, Decreased strength  Visit Diagnosis: Difficulty in walking, not elsewhere classified  Muscle weakness (generalized)     Problem List Patient Active Problem List   Diagnosis Date Noted  . Medicare annual wellness visit, subsequent 01/13/2019  . Parkinson's disease ([facility]) 03/12/2018  . Abnormal gait 01/04/2018  . Overactive bladder 05/21/2017  . Chronic pain of right knee 01/01/2017  . Chest pain with moderate risk for cardiac etiology 08/17/2016  . Advanced care planning/counseling discussion 01/23/2016  . Prediabetes 07/31/2015  . Rosacea 07/06/2015  . Personal history of other malignant neoplasm of skin 11/19/2014  . Abdominal wall hernia 09/08/2014  . BPH without obstruction/lower urinary tract symptoms 09/08/2014  . Chronic constipation 09/08/2014  .   Dyslipidemia 09/08/2014  . Glaucoma 09/08/2014  . GERD (gastroesophageal reflux disease) 09/08/2014  . Obesity, Class I, BMI 30-34.9 09/08/2014  . Nocturia 11/13/2012   A  SPT  Mansfield, Kristine S, SPT 02/13/2019, 4:41 PM  [place] [facility] MAIN REHAB SERVICES [address] [place], [place], [address] Phone: [phone]   Fax:  [phone]  Name: Joshua Everett MRN: [id number] Date of Birth: 08/30/1934   

## 2019-02-17 ENCOUNTER — Ambulatory Visit: Payer: Medicare Other | Admitting: Physical Therapy

## 2019-02-17 ENCOUNTER — Other Ambulatory Visit: Payer: Self-pay

## 2019-02-17 ENCOUNTER — Encounter: Payer: Self-pay | Admitting: Physical Therapy

## 2019-02-17 DIAGNOSIS — R262 Difficulty in walking, not elsewhere classified: Secondary | ICD-10-CM | POA: Diagnosis not present

## 2019-02-17 DIAGNOSIS — M6281 Muscle weakness (generalized): Secondary | ICD-10-CM

## 2019-02-17 NOTE — Therapy (Addendum)
[place] MAIN [facility] SERVICES [address] [place], [place], [address] Phone: [phone]   Fax:  [phone]  Physical Therapy Treatment  Patient Details  Name: Joshua Everett MRN: [id number] Date of Birth: 03/02/1935 Referring Provider (PT): Jennings Books   Encounter Date: 02/17/2019    PT End of Session - 02/20/19 1228    Visit Number  14    Number of Visits  17    Date for PT Re-Evaluation  02/17/19    PT Start Time  1400    PT Stop Time  1455    PT Time Calculation (min)  55 min    Equipment Utilized During Treatment  Gait belt    Activity Tolerance  Patient tolerated treatment well    Behavior During Therapy  WFL for tasks assessed/performed       Past Medical History:  Diagnosis Date  . BPH without obstruction/lower urinary tract symptoms 09/08/2014  . Chronic constipation 09/08/2014  . GERD (gastroesophageal reflux disease)   . Glaucoma   . History of chicken pox   . Hyperlipidemia   . Obesity, Class I, BMI 30-34.9 09/08/2014  . Prediabetes 07/31/2015  . Rosacea 07/06/2015  . Urine incontinence     Past Surgical History:  Procedure Laterality Date  . COLONOSCOPY  03/2009   1 TA, diverticulosis, rpt 5 yrs (Dr Acquanetta Sit [facility] in [facility])  . ESOPHAGOGASTRODUODENOSCOPY  03/2009   small HH, gastric polyps biopsied, dilation for dysphagia ([facility] [place])  . HEMORRHOID SURGERY  1976    There were no vitals filed for this visit.  Subjective Assessment - 02/17/19 1357    Subjective  Patient reports that he is doing his exercises.No reports of pain, expressed a general "achey" feeling.    Pertinent History  Patient presents with parkinsons disease. His feet get in his way and he falls against the shower door .He has not used a cane. He wants to be able to stand on one foot and also do a step up exercise. He has higher bed and has some difficulty with getting into the bed.He is working out in the yard and does not think he  is having any coordination or hand. He is having some balance difficulties.    Pain Onset  More than a month ago         Treatment:  Patient seen for LSVT Daily Session Maximal Daily Exercises for facilitation/coordination of movement Sustained movements are designed to rescale the amplitude of movement output for generalization to daily functional activities .Performed as follows for 1 set of 10 repetitions each multidirectional sustained movements  1) Floor to ceiling , cues to hold for 10 seconds,needs modeling for correct positions, VC to reach out furtherand to reach up to the ceiling 2) Side to side multidirectional Repetitive movements performed in sittingand are designed to provide retraining effort needed for sustained muscle activation in tasks, cues to lean fwd and hold position x 10 counts: Patient does not move fwd<>sidesitting<> to fwd and is not able to transition or stretch out his leg very far 3) Step and reach forward , cues for good knee flex, cues to move UE and LE together, cues to rotate his arms up to pronate his forearms 4) Step and reach backwards , cues for BUE back, getting toe up and bending back knee 5) Step and reach sideways, cues to turn her head sideways, and turn his head back to neutral, and to rotate his arms and  pronate forearms 6) Rock and reach forward/backward , cues to reach far fwd with UE's, patient not comfortable with feet apart, not rocking very far to get a good weight shift, not able to raise his heel or raise his toes much 7) Rock and reach sideways, cues for twist and look behind, only rotates side ways, unable to twist around or turn to look behind  functional component task with supervision 5 reps and simulated activities for: 1. Sit to standx 10needs cues to begin with UE up for correct start position 2.Stepping up to 6 inch stool in sitting<>stool<>floor<>stool<> floor x 5 to mimic getting his feet in and out of the car, needs  cues to really raise his feet up high 3.Practicedstepping up to first step at the stairs from floor, no UE assist, and from purple foam , no UE support with multiple LO 4Bed mobility :time constraints - not done today 5. Wallet out of pocket x 5  Walking with big arm swing x>1035fet, needs cues to swing big  CGA and Modverbal cues used throughout withepisodes ofLOB most seen with narrow base of support and SLS of stair step-ups. Continues to have balance deficits typical with diagnosis.                             PT Education - 02/17/19 1359    Education Details  LSVT Big    Person(s) Educated  Patient    Methods  Explanation;Demonstration;Tactile cues;Verbal cues;Handout    Comprehension  Verbalized understanding;Returned demonstration;Need further instruction       PT Short Term Goals - 02/13/19 1555      PT SHORT TERM GOAL #1   Title  Patient will be independent in home exercise program to improve strength/mobility for better functional independence with ADLs    Time  2    Period  Weeks    Status  Partially Met    Target Date  02/03/19        PT Long Term Goals - 02/13/19 1520      PT LONG TERM GOAL #1   Title  Patient (> 89years old) will complete five times sit to stand test in < 15 seconds indicating an increased LE strength and improved balance.    Baseline  9/10 16.89 sec 5xSTS    Time  4    Period  Weeks    Status  Partially Met    Target Date  03/17/19      PT LONG TERM GOAL #2   Title  Patient will increase six minute walk test distance to >1000 for progression to community ambulator and improve gait ability    Baseline  9/10 1125 ft 6MWT    Time  4    Period  Weeks    Status  Achieved    Target Date  03/17/19      PT LONG TERM GOAL #3   Title  Patient will reduce timed up and go to <11 seconds to reduce fall risk and demonstrate improved transfer/gait ability.    Baseline  9/10 10.38 sec TUG; 12.21 sec carry; 9.31  sec cognitive    Time  4    Period  Weeks    Status  Partially Met    Target Date  03/17/19      PT LONG TERM GOAL #4   Title  Patient will decrease 6 Peg score score by > 5 points to demonstrate improved coordination  of hand  functional activities.    Baseline  not tested today due to time constraints    Time  4    Period  Weeks    Status  On-going    Target Date  03/17/19        Plan - 02/20/19 1226    Clinical Impression Statement  Patient continues to exhibit improvements in LSVT BIG exercise program, with periodic verbal cues and demonstrations required to achieve the correct movements; fewer cues were required in today's session, indicating improved retainment of the LSVT BIG exercises. Patient will continue to benefit from skilled PT to improve mobility and safety.    Personal Factors and Comorbidities  Age    Examination-Activity Limitations  Bed Mobility;Transfers;Locomotion Level    Stability/Clinical Decision Making  Stable/Uncomplicated    Rehab Potential  Good    PT Frequency  4x / week    PT Duration  4 weeks    PT Treatment/Interventions  Gait training;Stair training;Functional mobility training;Therapeutic activities;Balance training;Patient/family education;Therapeutic exercise             Patient will benefit from skilled therapeutic intervention in order to improve the following deficits and impairments:     Visit Diagnosis: Difficulty in walking, not elsewhere classified - Plan: PT plan of care cert/re-cert  Muscle weakness (generalized) - Plan: PT plan of care cert/re-cert     Problem List Patient Active Problem List   Diagnosis Date Noted  . Medicare annual wellness visit, subsequent 01/13/2019  . Parkinson's disease ([place]) 03/12/2018  . Abnormal gait 01/04/2018  . Overactive bladder 05/21/2017  . Chronic pain of right knee 01/01/2017  . Chest pain with moderate risk for cardiac etiology 08/17/2016  . Advanced care planning/counseling  discussion 01/23/2016  . Prediabetes 07/31/2015  . Rosacea 07/06/2015  . Personal history of other malignant neoplasm of skin 11/19/2014  . Abdominal wall hernia 09/08/2014  . BPH without obstruction/lower urinary tract symptoms 09/08/2014  . Chronic constipation 09/08/2014  . Dyslipidemia 09/08/2014  . Glaucoma 09/08/2014  . GERD (gastroesophageal reflux disease) 09/08/2014  . Obesity, Class I, BMI 30-34.9 09/08/2014  . Nocturia 11/13/2012    Alanson Puls, PT DPT 02/17/2019, 2:01 PM  [place] MAIN [facility] SERVICES [address] [place], [place], [address] Phone: [phone]   Fax:  [phone]  Name: Joshua Everett MRN: [id number] Date of Birth: [birth year]

## 2019-02-19 ENCOUNTER — Other Ambulatory Visit: Payer: Self-pay

## 2019-02-19 ENCOUNTER — Encounter: Payer: Self-pay | Admitting: Physical Therapy

## 2019-02-19 ENCOUNTER — Ambulatory Visit: Payer: Medicare Other | Admitting: Physical Therapy

## 2019-02-19 DIAGNOSIS — R262 Difficulty in walking, not elsewhere classified: Secondary | ICD-10-CM

## 2019-02-19 DIAGNOSIS — M6281 Muscle weakness (generalized): Secondary | ICD-10-CM

## 2019-02-19 NOTE — Therapy (Signed)
[place] MAIN [facility] SERVICES [address] [place], [place], [address] Phone: [phone]   Fax:  [phone]  Physical Therapy Treatment  Patient Details  Name: Joshua Everett MRN: [id number] Date of Birth: 1934/07/27 Referring Provider (PT): Jennings Books   Encounter Date: 02/19/2019  PT End of Session - 02/19/19 1312    Visit Number  15    Number of Visits  17    Date for PT Re-Evaluation  02/20/19    PT Start Time  1300    PT Stop Time  1355    PT Time Calculation (min)  55 min    Equipment Utilized During Treatment  Gait belt    Activity Tolerance  Patient tolerated treatment well    Behavior During Therapy  WFL for tasks assessed/performed       Past Medical History:  Diagnosis Date  . BPH without obstruction/lower urinary tract symptoms 09/08/2014  . Chronic constipation 09/08/2014  . GERD (gastroesophageal reflux disease)   . Glaucoma   . History of chicken pox   . Hyperlipidemia   . Obesity, Class I, BMI 30-34.9 09/08/2014  . Prediabetes 07/31/2015  . Rosacea 07/06/2015  . Urine incontinence     Past Surgical History:  Procedure Laterality Date  . COLONOSCOPY  03/2009   1 TA, diverticulosis, rpt 5 yrs (Dr [facility]qu[facility] in [facility])  . ESOPHAGOGASTRODUODENOSCOPY  03/2009   small HH, gastric polyps biopsied, dilation for dysphagia ([facility] [place] [facility])  . HEMORRHOID SURGERY  1976    There were no vitals filed for this visit.  Subjective Assessment - 02/19/19 1311    Subjective  Patient reports that he is doing well today. No reports of pain, and has been compliant with his HEP.    Pertinent History  Patient presents with parkinsons disease. His feet get in his way and he falls against the shower door .He has not used a cane. He wants to be able to stand on one foot and also do a step up exercise. He has higher bed and has some difficulty with getting into the bed.He is working out in the yard and does not think he is  having any coordination or hand. He is having some balance difficulties.    Currently in Pain?  No/denies    Pain Score  0-No pain    Pain Onset  More than a month ago       This entire session was performed under direct supervision and direction of a licensed therapist/therapist assistant . I have personally read, edited and approve of the note as written.Treatment:  Patient seen for LSVT Daily Session Maximal Daily Exercises for facilitation/coordination of movement Sustained movements are designed to rescale the amplitude of movement output for generalization to daily functional activities .Performed as follows for 1 set of 10 repetitions each multidirectional sustained movements  1) Floor to ceiling , cues to hold for 10 seconds,needs modeling for correct positions, VC to reach out furtherand to reach up to the ceiling 2) Side to side multidirectional Repetitive movements performed in sittingand are designed to provide retraining effort needed for sustained muscle activation in tasks, cues to lean fwd and hold position x 10 counts: Patient does not move fwd<>sidesitting<> to fwd and is not able to transition or stretch out his leg very far 3) Step and reach forward , cues for good knee flex, cues to move UE and LE together, cues to rotate his arms up to pronate his  forearms 4) Step and reach backwards , cues for BUE back, getting toe up and bending back knee 5) Step and reach sideways, cues to turn her head sideways, and turn his head back to neutral, and to rotate his arms and pronate forearms 6) Rock and reach forward/backward , cues to reach far fwd with UE's, patient not comfortable with feet apart, not rocking very far to get a good weight shift, not able to raise his heel or raise his toes much 7) Rock and reach sideways, cues for twist and look behind, only rotates side ways, unable to twist around or turn to look behind  functional component task with supervision 5 reps and  simulated activities for: 1. Sit to standx 10needs cues to begin with UE up for correct start position 2.Stepping up to 6 inch stool in sitting<>stool<>floor<>stool<> floor x 5 to mimic getting his feet in and out of the car, needs cues to really raise his feet up high 3.Practicedstepping up to first step at the stairs from floor, no UE assist, and from purple foam , no UE support with multiple LO 4Bed mobility :time constraints - not done today 5. Wallet out of pocket x 5  Walking with big arm swing x>1069fet, needs cues to swing big  CGA and Modverbal cues used throughout withepisodes ofLOB most seen with narrow base of support and SLS of stair step-ups. Continues to have balance deficits typical with diagnosis.      PT Education - 02/19/19 1312    Education Details  LSVT BIG    Person(s) Educated  Patient    Methods  Explanation;Demonstration;Tactile cues;Verbal cues;Handout    Comprehension  Need further instruction;Returned demonstration;Verbalized understanding       PT Short Term Goals - 02/13/19 1555      PT SHORT TERM GOAL #1   Title  Patient will be independent in home exercise program to improve strength/mobility for better functional independence with ADLs    Time  2    Period  Weeks    Status  Partially Met    Target Date  02/03/19        PT Long Term Goals - 02/13/19 1520      PT LONG TERM GOAL #1   Title  Patient (> 89years old) will complete five times sit to stand test in < 15 seconds indicating an increased LE strength and improved balance.    Baseline  9/10 16.89 sec 5xSTS    Time  4    Period  Weeks    Status  Partially Met    Target Date  03/17/19      PT LONG TERM GOAL #2   Title  Patient will increase six minute walk test distance to >1000 for progression to community ambulator and improve gait ability    Baseline  9/10 1125 ft 6MWT    Time  4    Period  Weeks    Status  Achieved    Target Date  03/17/19      PT LONG TERM  GOAL #3   Title  Patient will reduce timed up and go to <11 seconds to reduce fall risk and demonstrate improved transfer/gait ability.    Baseline  9/10 10.38 sec TUG; 12.21 sec carry; 9.31 sec cognitive    Time  4    Period  Weeks    Status  Partially Met    Target Date  03/17/19      PT LONG TERM GOAL #4  Title  Patient will decrease 6 Peg score score by > 5 points to demonstrate improved coordination of hand  functional activities.    Baseline  not tested today due to time constraints    Time  4    Period  Weeks    Status  On-going    Target Date  03/17/19              Patient will benefit from skilled therapeutic intervention in order to improve the following deficits and impairments:     Visit Diagnosis: Difficulty in walking, not elsewhere classified  Muscle weakness (generalized)     Problem List Patient Active Problem List   Diagnosis Date Noted  . Medicare annual wellness visit, subsequent 01/13/2019  . Parkinson's disease ([place]) 03/12/2018  . Abnormal gait 01/04/2018  . Overactive bladder 05/21/2017  . Chronic pain of right knee 01/01/2017  . Chest pain with moderate risk for cardiac etiology 08/17/2016  . Advanced care planning/counseling discussion 01/23/2016  . Prediabetes 07/31/2015  . Rosacea 07/06/2015  . Personal history of other malignant neoplasm of skin 11/19/2014  . Abdominal wall hernia 09/08/2014  . BPH without obstruction/lower urinary tract symptoms 09/08/2014  . Chronic constipation 09/08/2014  . Dyslipidemia 09/08/2014  . Glaucoma 09/08/2014  . GERD (gastroesophageal reflux disease) 09/08/2014  . Obesity, Class I, BMI 30-34.9 09/08/2014  . Nocturia 11/13/2012   Alanson Puls, PT, DPT Alean Rinne 02/19/2019, 1:14 PM  [place] MAIN [facility] SERVICES [address] [place], [place], [address] Phone: [phone]   Fax:  [phone]  Name: Joshua Everett MRN: [id number] Date of  Birth: December 19, 1934

## 2019-02-20 ENCOUNTER — Ambulatory Visit: Payer: Medicare Other | Admitting: Physical Therapy

## 2019-02-20 NOTE — Addendum Note (Signed)
Addended by: Alanson Puls on: 02/20/2019 01:29 PM   Modules accepted: Orders

## 2019-02-27 DIAGNOSIS — N3941 Urge incontinence: Secondary | ICD-10-CM | POA: Diagnosis not present

## 2019-03-05 ENCOUNTER — Ambulatory Visit: Payer: Medicare Other | Admitting: Physical Therapy

## 2019-03-05 ENCOUNTER — Encounter: Payer: Self-pay | Admitting: Physical Therapy

## 2019-03-05 DIAGNOSIS — R262 Difficulty in walking, not elsewhere classified: Secondary | ICD-10-CM | POA: Diagnosis not present

## 2019-03-05 DIAGNOSIS — M6281 Muscle weakness (generalized): Secondary | ICD-10-CM | POA: Diagnosis not present

## 2019-03-05 NOTE — Therapy (Signed)
[place] MAIN [facility] SERVICES [address] [place], [place], [address] Phone: [phone]   Fax:  [phone]  Physical Therapy Treatment  Patient Details  Name: Joshua Everett MRN: [id number] Date of Birth: 15-Feb-1935 Referring Provider (PT): Jennings Books   Encounter Date: 03/05/2019  PT End of Session - 03/05/19 1309    Visit Number  16    Number of Visits  17    Date for PT Re-Evaluation  02/17/19    PT Start Time  0100    PT Stop Time  0145    PT Time Calculation (min)  45 min    Equipment Utilized During Treatment  Gait belt    Activity Tolerance  Patient tolerated treatment well    Behavior During Therapy  WFL for tasks assessed/performed       Past Medical History:  Diagnosis Date  . BPH without obstruction/lower urinary tract symptoms 09/08/2014  . Chronic constipation 09/08/2014  . GERD (gastroesophageal reflux disease)   . Glaucoma   . History of chicken pox   . Hyperlipidemia   . Obesity, Class I, BMI 30-34.9 09/08/2014  . Prediabetes 07/31/2015  . Rosacea 07/06/2015  . Urine incontinence     Past Surgical History:  Procedure Laterality Date  . COLONOSCOPY  03/2009   1 TA, diverticulosis, rpt 5 yrs (Dr Acquan[facility])  . ESOPHAGOGASTRODUODENOSCOPY  03/2009   small HH, gastric polyps biopsied, dilation for dysphagia ([place])  . HEMORRHOID SURGERY  1976    There were no vitals filed for this visit.  Subjective Assessment - 03/05/19 1308    Subjective  Patient reports that he is doing well today. No reports of pain, and has been compliant with his HEP.    Pertinent History  Patient presents with parkinsons disease. His feet get in his way and he falls against the shower door .He has not used a cane. He wants to be able to stand on one foot and also do a step up exercise. He has higher bed and has some difficulty with getting into the bed.He is working out in the yard and does not think he is  having any coordination or hand. He is having some balance difficulties.    Currently in Pain?  No/denies    Pain Score  0-No pain    Pain Onset  More than a month ago          Treatment:  Patient seen for LSVT Daily Session Maximal Daily Exercises for facilitation/coordination of movement Sustained movements are designed to rescale the amplitude of movement output for generalization to daily functional activities .Performed as follows for 1 set of 10 repetitions each multidirectional sustained movements  1) Floor to ceiling , cues to hold for 10 seconds,needs modeling for correct positions, VC to reach out furtherand to reach up to the ceiling 2) Side to side multidirectional Repetitive movements performed in sittingand are designed to provide retraining effort needed for sustained muscle activation in tasks, cues to lean fwd and hold position x 10 counts: Patient does not move fwd<>sidesitting<> to fwd and is not able to transition or stretch out his leg very far 3) Step and reach forward , cues for good knee flex, cues to move UE and LE together, cues to rotate his arms up to pronate his forearms 4) Step and reach backwards , cues for BUE back, getting toe up and bending back knee 5) Step and reach sideways, cues  to turn her head sideways, and turn his head back to neutral, and to rotate his arms and pronate forearms 6) Rock and reach forward/backward , cues to reach far fwd with UE's, patient not comfortable with feet apart, not rocking very far to get a good weight shift, not able to raise his heel or raise his toes much 7) Rock and reach sideways, cues for twist and look behind, only rotates side ways, unable to twist around or turn to look behind  functional component task with supervision 5 reps and simulated activities for: 1. Sit to standx 10needs cues to begin with UE up for correct start position 2.Stepping up to 6 inch stool in sitting<>stool<>floor<>stool<> floor x 5  to mimic getting his feet in and out of the car, needs cues to really raise his feet up high 3.Practicedstepping up to first step at the stairs from floor, no UE assist, and from purple foam , no UE support with multiple LO 4Bed mobility :time constraints - not done today 5. Wallet out of pocket x 5    CGA and Modverbal cues used throughout withepisodes ofLOB most seen with narrow base of support and SLS of stair step-ups. Continues to have balance deficits typical with diagnosis.                       PT Education - 03/05/19 1308    Education Details  HEP    Person(s) Educated  Patient    Methods  Explanation;Demonstration;Verbal cues    Comprehension  Returned demonstration;Verbal cues required       PT Short Term Goals - 02/13/19 1555      PT SHORT TERM GOAL #1   Title  Patient will be independent in home exercise program to improve strength/mobility for better functional independence with ADLs    Time  2    Period  Weeks    Status  Partially Met    Target Date  02/03/19        PT Long Term Goals - 02/13/19 1520      PT LONG TERM GOAL #1   Title  Patient (> 8 years old) will complete five times sit to stand test in < 15 seconds indicating an increased LE strength and improved balance.    Baseline  9/10 16.89 sec 5xSTS    Time  4    Period  Weeks    Status  Partially Met    Target Date  03/17/19      PT LONG TERM GOAL #2   Title  Patient will increase six minute walk test distance to >1000 for progression to community ambulator and improve gait ability    Baseline  9/10 1125 ft 6MWT    Time  4    Period  Weeks    Status  Achieved    Target Date  03/17/19      PT LONG TERM GOAL #3   Title  Patient will reduce timed up and go to <11 seconds to reduce fall risk and demonstrate improved transfer/gait ability.    Baseline  9/10 10.38 sec TUG; 12.21 sec carry; 9.31 sec cognitive    Time  4    Period  Weeks    Status  Partially Met     Target Date  03/17/19      PT LONG TERM GOAL #4   Title  Patient will decrease 6 Peg score score by > 5 points to demonstrate improved coordination of  hand  functional activities.    Baseline  not tested today due to time constraints    Time  4    Period  Weeks    Status  On-going    Target Date  03/17/19            Plan - 03/05/19 1312    Clinical Impression Statement  Patient demonstrates improved understanding and adherence to the LSVT BIG program exercises, requiring periodic verbal cues and demonstrations to retain proper sequencing of movements. Patient will benefit from continued skilled PT to improve mobility and safety.    Personal Factors and Comorbidities  Age    Examination-Activity Limitations  Bed Mobility;Transfers;Locomotion Level    Stability/Clinical Decision Making  Stable/Uncomplicated    Rehab Potential  Good    PT Frequency  4x / week    PT Duration  4 weeks    PT Treatment/Interventions  Gait training;Stair training;Functional mobility training;Therapeutic activities;Balance training;Patient/family education;Therapeutic exercise       Patient will benefit from skilled therapeutic intervention in order to improve the following deficits and impairments:  Abnormal gait, Decreased balance, Decreased endurance, Decreased mobility, Difficulty walking, Decreased coordination, Decreased safety awareness, Decreased strength  Visit Diagnosis: Muscle weakness (generalized)     Problem List Patient Active Problem List   Diagnosis Date Noted  . Medicare annual wellness visit, subsequent 01/13/2019  . Parkinson's disease ([place]) 03/12/2018  . Abnormal gait 01/04/2018  . Overactive bladder 05/21/2017  . Chronic pain of right knee 01/01/2017  . Chest pain with moderate risk for cardiac etiology 08/17/2016  . Advanced care planning/counseling discussion 01/23/2016  . Prediabetes 07/31/2015  . Rosacea 07/06/2015  . Personal history of other malignant neoplasm of  skin 11/19/2014  . Abdominal wall hernia 09/08/2014  . BPH without obstruction/lower urinary tract symptoms 09/08/2014  . Chronic constipation 09/08/2014  . Dyslipidemia 09/08/2014  . Glaucoma 09/08/2014  . GERD (gastroesophageal reflux disease) 09/08/2014  . Obesity, Class I, BMI 30-34.9 09/08/2014  . Nocturia 11/13/2012    Alanson Puls, PT DPT 03/05/2019, 1:13 PM  [place] MAIN [facility] SERVICES [address] [place], [place], [address] Phone: [phone]   Fax:  [phone]  Name: Joshua Everett MRN: [id number] Date of Birth: 1935-05-28

## 2019-03-06 ENCOUNTER — Ambulatory Visit: Payer: Medicare Other | Attending: Neurology | Admitting: Physical Therapy

## 2019-03-06 ENCOUNTER — Other Ambulatory Visit: Payer: Self-pay

## 2019-03-06 ENCOUNTER — Encounter: Payer: Self-pay | Admitting: Physical Therapy

## 2019-03-06 DIAGNOSIS — R262 Difficulty in walking, not elsewhere classified: Secondary | ICD-10-CM | POA: Diagnosis not present

## 2019-03-06 DIAGNOSIS — M6281 Muscle weakness (generalized): Secondary | ICD-10-CM | POA: Diagnosis not present

## 2019-03-06 NOTE — Therapy (Signed)
[place] MAIN [facility] SERVICES [address] [place], [place], [address] Phone: [phone]   Fax:  [phone]  Physical Therapy Treatment  Patient Details  Name: Joshua Everett MRN: [id number] Date of Birth: 06-Dec-1934 Referring Provider (PT): Jennings Books   Encounter Date: 03/06/2019  PT End of Session - 03/06/19 1306    Visit Number  17    Number of Visits  17    Date for PT Re-Evaluation  02/17/19    PT Start Time  0102    PT Stop Time  0145    PT Time Calculation (min)  43 min    Equipment Utilized During Treatment  Gait belt    Activity Tolerance  Patient tolerated treatment well    Behavior During Therapy  WFL for tasks assessed/performed       Past Medical History:  Diagnosis Date  . BPH without obstruction/lower urinary tract symptoms 09/08/2014  . Chronic constipation 09/08/2014  . GERD (gastroesophageal reflux disease)   . Glaucoma   . History of chicken pox   . Hyperlipidemia   . Obesity, Class I, BMI 30-34.9 09/08/2014  . Prediabetes 07/31/2015  . Rosacea 07/06/2015  . Urine incontinence     Past Surgical History:  Procedure Laterality Date  . COLONOSCOPY  03/2009   1 TA, diverticulosis, rpt 5 yrs (Dr [facility] in [facility])  . ESOPHAGOGASTRODUODENOSCOPY  03/2009   small HH, gastric polyps biopsied, dilation for dysphagia ([place])  . HEMORRHOID SURGERY  1976    There were no vitals filed for this visit.  Subjective Assessment - 03/06/19 1306    Subjective  Patient reports that he is doing well today. No reports of pain, and has been compliant with his HEP.    Pertinent History  Patient presents with parkinsons disease. His feet get in his way and he falls against the shower door .He has not used a cane. He wants to be able to stand on one foot and also do a step up exercise. He has higher bed and has some difficulty with getting into the bed.He is working out in the yard and does not think he is  having any coordination or hand. He is having some balance difficulties.    Currently in Pain?  No/denies    Pain Score  0-No pain    Pain Onset  More than a month ago      Treatment:  Outcome measures performed including 6 MW, 5 x sit to stand and Tug, Tug cog, Tug carry to assess goals.   functional component task with supervision 5 reps and simulated activities for: 1. Sit to standx 10needs cues to begin with UE up for correct start position 2.Stepping up to 6 inch stool in sitting<>stool<>floor<>stool<> floor x 5 to mimic getting his feet in and out of the car, needs cues to really raise his feet up high 3.Practicedstepping up to first step at the stairs from floor, no UE assist, and from purple foam , no UE support with multiple LO 4Bed mobility :time constraints - not done today 5. Wallet out of pocket x 5                        PT Education - 03/06/19 1306    Education Details  LSVT BIG    Person(s) Educated  Patient    Methods  Explanation;Demonstration;Tactile cues;Verbal cues    Comprehension  Verbalized understanding;Returned demonstration;Tactile cues  required       PT Short Term Goals - 02/13/19 1555      PT SHORT TERM GOAL #1   Title  Patient will be independent in home exercise program to improve strength/mobility for better functional independence with ADLs    Time  2    Period  Weeks    Status  Partially Met    Target Date  02/03/19        PT Long Term Goals - 03/06/19 1308      PT LONG TERM GOAL #1   Title  Patient (> 70 years old) will complete five times sit to stand test in < 15 seconds indicating an increased LE strength and improved balance.    Baseline  9/10 16.89 sec 5xSTS:03/06/19=14.47 sec    Time  4    Period  Weeks    Status  Partially Met      PT LONG TERM GOAL #2   Title  Patient will increase six minute walk test distance to >1000 for progression to community ambulator and improve gait ability    Baseline   9/10 1125 ft 6MWT    Time  4    Period  Weeks    Status  Achieved      PT LONG TERM GOAL #3   Title  Patient will reduce timed up and go to <11 seconds to reduce fall risk and demonstrate improved transfer/gait ability.    Baseline  9/10 10.38 sec TUG; 12.21 sec carry; 9.31 sec cognitive10/1/20=10.02, carry 10.75, cognitive12.52    Time  4    Period  Weeks    Status  Partially Met      PT LONG TERM GOAL #4   Title  Patient will decrease 6 Peg score score by > 5 points to demonstrate improved coordination of hand  functional activities.    Baseline  not tested today due to time constraints, 03/06/19=28.91 sec left: 30.48 right    Time  4    Period  Weeks    Status  Partially Met            Plan - 03/06/19 1503    Clinical Impression Statement  Patient is seen for final outcome measure results and functional activities for LSVT Big. He improved in all of his outcome measures and is independent with LSVT BIG HEP. He will be DC from skilled PT to HEP.    Personal Factors and Comorbidities  Age    Examination-Activity Limitations  Bed Mobility;Transfers;Locomotion Level    Stability/Clinical Decision Making  Stable/Uncomplicated    Rehab Potential  Good    PT Frequency  4x / week    PT Duration  4 weeks    PT Treatment/Interventions  Gait training;Stair training;Functional mobility training;Therapeutic activities;Balance training;Patient/family education;Therapeutic exercise       Patient will benefit from skilled therapeutic intervention in order to improve the following deficits and impairments:  Abnormal gait, Decreased balance, Decreased endurance, Decreased mobility, Difficulty walking, Decreased coordination, Decreased safety awareness, Decreased strength  Visit Diagnosis: Difficulty in walking, not elsewhere classified  Muscle weakness (generalized)     Problem List Patient Active Problem List   Diagnosis Date Noted  . Medicare annual wellness visit, subsequent  01/13/2019  . Parkinson's disease ([place]) 03/12/2018  . Abnormal gait 01/04/2018  . Overactive bladder 05/21/2017  . Chronic pain of right knee 01/01/2017  . Chest pain with moderate risk for cardiac etiology 08/17/2016  . Advanced care planning/counseling discussion 01/23/2016  .  Prediabetes 07/31/2015  . Rosacea 07/06/2015  . Personal history of other malignant neoplasm of skin 11/19/2014  . Abdominal wall hernia 09/08/2014  . BPH without obstruction/lower urinary tract symptoms 09/08/2014  . Chronic constipation 09/08/2014  . Dyslipidemia 09/08/2014  . Glaucoma 09/08/2014  . GERD (gastroesophageal reflux disease) 09/08/2014  . Obesity, Class I, BMI 30-34.9 09/08/2014  . Nocturia 11/13/2012    Alanson Puls, PT DPT 03/06/2019, 3:05 PM  [place] MAIN [facility] SERVICES [address] [place], [place], [address] Phone: [phone]   Fax:  [phone]  Name: Joshua Everett MRN: [id number] Date of Birth: 05/06/1935

## 2019-03-20 DIAGNOSIS — N3941 Urge incontinence: Secondary | ICD-10-CM | POA: Diagnosis not present

## 2019-03-25 DIAGNOSIS — I781 Nevus, non-neoplastic: Secondary | ICD-10-CM | POA: Diagnosis not present

## 2019-03-25 DIAGNOSIS — Z85828 Personal history of other malignant neoplasm of skin: Secondary | ICD-10-CM | POA: Diagnosis not present

## 2019-03-25 DIAGNOSIS — D485 Neoplasm of uncertain behavior of skin: Secondary | ICD-10-CM | POA: Diagnosis not present

## 2019-03-25 DIAGNOSIS — L570 Actinic keratosis: Secondary | ICD-10-CM | POA: Diagnosis not present

## 2019-03-31 ENCOUNTER — Telehealth: Payer: Self-pay | Admitting: Gastroenterology

## 2019-03-31 NOTE — Telephone Encounter (Signed)
Pt wife left vm pt needs new prescription for rx [facility]

## 2019-04-01 ENCOUNTER — Other Ambulatory Visit: Payer: Self-pay

## 2019-04-01 MED ORDER — LINACLOTIDE 290 MCG PO CAPS: 290.0000 ug | ORAL_CAPSULE | Freq: Every day | ORAL | 3 refills | Status: DC

## 2019-04-01 NOTE — Telephone Encounter (Signed)
Rx and precert has been sent to [facility].

## 2019-04-17 DIAGNOSIS — N3941 Urge incontinence: Secondary | ICD-10-CM | POA: Diagnosis not present

## 2019-04-21 DIAGNOSIS — C44311 Basal cell carcinoma of skin of nose: Secondary | ICD-10-CM | POA: Diagnosis not present

## 2019-04-21 DIAGNOSIS — L578 Other skin changes due to chronic exposure to nonionizing radiation: Secondary | ICD-10-CM | POA: Diagnosis not present

## 2019-04-21 DIAGNOSIS — L820 Inflamed seborrheic keratosis: Secondary | ICD-10-CM | POA: Diagnosis not present

## 2019-05-12 ENCOUNTER — Other Ambulatory Visit: Payer: Self-pay | Admitting: *Deleted

## 2019-05-12 MED ORDER — AMLODIPINE BESYLATE 5 MG PO TABS: 5.0000 mg | ORAL_TABLET | Freq: Every day | ORAL | 2 refills | Status: DC

## 2019-05-12 NOTE — Telephone Encounter (Signed)
Patient's wife left a voicemail stating that he needs a refill for his Amlodipine sent to [facility].

## 2019-07-31 ENCOUNTER — Ambulatory Visit: Payer: Medicare Other | Attending: Internal Medicine

## 2019-07-31 DIAGNOSIS — Z23 Encounter for immunization: Secondary | ICD-10-CM | POA: Insufficient documentation

## 2019-07-31 NOTE — Progress Notes (Signed)
   Covid-19 Vaccination Clinic  Name:  Nicholaos Shankman    MRN: [id number] DOB: 01/01/1935  07/31/2019  Mr. Sherfield was observed post Covid-19 immunization for 30 minutes based on pre-vaccination screening without incidence. He was provided with Vaccine Information Sheet and instruction to access the V-Safe system.   Mr. Orecchio was instructed to call 911 with any severe reactions post vaccine: [name] Kitchen Difficulty breathing  . Swelling of your face and throat  . A fast heartbeat  . A bad rash all over your body  . Dizziness and weakness    Immunizations Administered    Name Date Dose VIS Date Route   Pfizer COVID-19 Vaccine 07/31/2019 10:57 AM 0.3 mL 05/16/2019 Intramuscular   Manufacturer: [place]   Lot: [id number]   [place]: [id number]

## 2019-08-27 ENCOUNTER — Ambulatory Visit: Payer: Medicare Other | Attending: Internal Medicine

## 2019-08-27 DIAGNOSIS — Z23 Encounter for immunization: Secondary | ICD-10-CM

## 2019-08-27 NOTE — Progress Notes (Signed)
   Covid-19 Vaccination Clinic  Name:  Joshua Everett    MRN: [id number] DOB: 1934-10-27  08/27/2019  Joshua Everett was observed post Covid-19 immunization for 15 minutes without incident. He was provided with Vaccine Information Sheet and instruction to access the V-Safe system.   Joshua Everett was instructed to call 911 with any severe reactions post vaccine: [name] Kitchen Difficulty breathing  . Swelling of face and throat  . A fast heartbeat  . A bad rash all over body  . Dizziness and weakness   Immunizations Administered    Name Date Dose VIS Date Route   Pfizer COVID-19 Vaccine 08/27/2019 11:56 AM 0.3 mL 05/16/2019 Intramuscular   Manufacturer: [place]   Lot: [id number]   [place]: [id number]

## 2019-08-28 ENCOUNTER — Telehealth: Payer: Self-pay

## 2019-08-28 NOTE — Telephone Encounter (Signed)
Mrs Hollander said that pt has swelling in both lower legs for 2-4 months; pt takes BP med but thinks may need diuretic. Pt has no difficulty breathing and no CP or SOB. Pt has not taken BP recently; questions if home cuff is accurate. No pain in legs. When pt wears compression stockings has less swelling in lower legs. UC & ED precautions given and pts wife voiced understandng.. In office appt scheduled for 08/29/19 at 10;15 with pts arrival at 10AM to be checked in at front desk.Pt has no covid symptoms, no travel and no known exposure to + covid.

## 2019-08-29 ENCOUNTER — Ambulatory Visit (INDEPENDENT_AMBULATORY_CARE_PROVIDER_SITE_OTHER): Payer: Medicare Other | Admitting: Family Medicine

## 2019-08-29 ENCOUNTER — Other Ambulatory Visit: Payer: Self-pay

## 2019-08-29 ENCOUNTER — Encounter: Payer: Self-pay | Admitting: Family Medicine

## 2019-08-29 VITALS — BP 140/60 | HR 67 | Temp 97.9°F | Ht 70.0 in | Wt 234.1 lb

## 2019-08-29 DIAGNOSIS — R600 Localized edema: Secondary | ICD-10-CM | POA: Diagnosis not present

## 2019-08-29 DIAGNOSIS — I10 Essential (primary) hypertension: Secondary | ICD-10-CM

## 2019-08-29 DIAGNOSIS — G20 Parkinson's disease: Secondary | ICD-10-CM | POA: Diagnosis not present

## 2019-08-29 LAB — COMPREHENSIVE METABOLIC PANEL
ALT: 8 U/L (ref 0–53)
AST: 20 U/L (ref 0–37)
Albumin: 4 g/dL (ref 3.5–5.2)
Alkaline Phosphatase: 43 U/L (ref 39–117)
BUN: 13 mg/dL (ref 6–23)
CO2: 31 mEq/L (ref 19–32)
Calcium: 9.7 mg/dL (ref 8.4–10.5)
Chloride: 102 mEq/L (ref 96–112)
Creatinine, Ser: 0.96 mg/dL (ref 0.40–1.50)
GFR: 74.79 mL/min (ref >60.00)
Glucose, Bld: 92 mg/dL (ref 70–99)
Potassium: 4.2 mEq/L (ref 3.5–5.1)
Sodium: 136 mEq/L (ref 135–145)
Total Bilirubin: 0.7 mg/dL (ref 0.2–1.2)
Total Protein: 6.5 g/dL (ref 6.0–8.3)

## 2019-08-29 LAB — MICROALBUMIN / CREATININE URINE RATIO
Creatinine,U: 56 mg/dL
Microalb Creat Ratio: 1.2 mg/g (ref 0.0–30.0)
Microalb, Ur: <0.7 mg/dL (ref 0.0–1.9)

## 2019-08-29 LAB — CBC WITH DIFFERENTIAL/PLATELET
Basophils Absolute: 0 10*3/uL (ref 0.0–0.1)
Basophils Relative: 0.6 % (ref 0.0–3.0)
Eosinophils Absolute: 0.2 10*3/uL (ref 0.0–0.7)
Eosinophils Relative: 3.9 % (ref 0.0–5.0)
HCT: 39.3 % (ref 39.0–52.0)
Hemoglobin: 13.4 g/dL (ref 13.0–17.0)
Lymphocytes Relative: 29.9 % (ref 12.0–46.0)
Lymphs Abs: 1.8 10*3/uL (ref 0.7–4.0)
MCHC: 34 g/dL (ref 30.0–36.0)
MCV: 86.2 fl (ref 78.0–100.0)
Monocytes Absolute: 0.6 10*3/uL (ref 0.1–1.0)
Monocytes Relative: 9.6 % (ref 3.0–12.0)
Neutro Abs: 3.3 10*3/uL (ref 1.4–7.7)
Neutrophils Relative %: 56 % (ref 43.0–77.0)
Platelets: 195 10*3/uL (ref 150.0–400.0)
RBC: 4.56 Mil/uL (ref 4.22–5.81)
RDW: 13.7 % (ref 11.5–15.5)
WBC: 6 10*3/uL (ref 4.0–10.5)

## 2019-08-29 LAB — TSH: TSH: 2.43 u[IU]/mL (ref 0.35–4.50)

## 2019-08-29 MED ORDER — LISINOPRIL 10 MG PO TABS: 10.0000 mg | ORAL_TABLET | Freq: Every day | ORAL | 3 refills | Status: DC

## 2019-08-29 NOTE — Assessment & Plan Note (Signed)
Amlodipine helped control blood pressure but may be contributing to pedal edema - will change to lisinopril 10mg  daily. Reviewed watching for dry cough side effect or angioedema allergy. RTC 1 mo f/u visit. Check labs today, again 1 wk after starting ACEI.

## 2019-08-29 NOTE — Patient Instructions (Addendum)
Labs today. Leg swelling could be partly coming from amlodipine medicine.  Let's change BP med to lisinopril. Return in 7-10 days for lab visit only to repeat blood work to ensure kidneys are ok.  Return in 1 month, sooner if needed.  Continue compression stocking use and leg elevation, avoid salt in diet, drink plenty of fluid.

## 2019-08-29 NOTE — Assessment & Plan Note (Signed)
Appreciate neuro care. Seems to have stable period.

## 2019-08-29 NOTE — Progress Notes (Signed)
This visit was conducted in person.  BP 140/60 (BP Location: Left Arm, Patient Position: Sitting, Cuff Size: Large)   Pulse 67   Temp 97.9 F (36.6 C) (Temporal)   Ht 5\' 10"  (1.778 m)   Wt 234 lb 1 oz (106.2 kg)   SpO2 96%   BMI 33.58 kg/m   BP Readings from Last 3 Encounters:  08/29/19 140/60  01/23/19 (!) 176/78  01/13/19 (!) 158/80    CC: BLE swelling Subjective:    Patient ID: Joshua Everett, male    DOB: [birth year], 85 y.o.   MRN: [id number]  HPI: [name] is a 85 y.o. male presenting on 08/29/2019 for Leg Swelling (C/o bilateral lower leg swelling.  Started about 2 mos ago.  Denies any pain.  Pt provided note to Dr. Darnell Level from urologist. )   Several month h/o bilateral pedal edema, L>R. Using compression stockings regularly with benefit. He had not noticed this really. Urologist noticed it. Denies redness, pain, warmth of legs. Occasional burning discomfort of R anterior thigh, not really bothersome. No paresthesia or numbness of feet. No noted claudication symptoms. Denies other swelling, dyspnea or chest pain, abd pain, nausea.   Amlodipine was started 01/2019 due to uncontrolled hypertension.   Saw Dr Diona Fanti urology - receiving PTNS for incontinence as well as flomax. Insurance stopped covring PTNS. Received 40 treatments. Myrbetriq was stopped recently. Also receiving desmopressin.   Known PD - on sinemet IR (6 tablets/day) followed by neurology Dr Manuella Ghazi.      Relevant past medical, surgical, family and social history reviewed and updated as indicated. Interim medical history since our last visit reviewed. Allergies and medications reviewed and updated. Outpatient Medications Prior to Visit  Medication Sig Dispense Refill  . B Complex Vitamins (VITAMIN B COMPLEX PO) Take 1 tablet by mouth daily.    . bimatoprost (LUMIGAN) 0.01 % SOLN Place 1 drop into both eyes at bedtime.     . cholecalciferol (VITAMIN D) 1000 units tablet Take 1,000 Units by mouth daily.      . cycloSPORINE (RESTASIS) 0.05 % ophthalmic emulsion Place 1 drop into both eyes 2 (two) times daily.    [name] Kitchen desmopressin (DDAVP) 0.2 MG tablet Take 0.6 mg by mouth at bedtime.     . fenofibrate 160 MG tablet Take 1 tablet (160 mg total) by mouth daily. 90 tablet 3  . linaclotide (LINZESS) 290 MCG CAPS capsule Take 1 capsule (290 mcg total) by mouth daily before breakfast. 90 capsule 3  . Multiple Vitamins-Minerals (MULTIVITAMIN ADULT PO) Take 1 tablet by mouth daily.    . NON FORMULARY Take 2 drops by mouth daily. TINCTURE OF GARLIC    . pantoprazole (PROTONIX) 40 MG tablet Take 1 tablet (40 mg total) by mouth daily. 90 tablet 3  . simvastatin (ZOCOR) 40 MG tablet TAKE 1 TABLET DAILY AT 6 P.M. 90 tablet 3  . tamsulosin (FLOMAX) 0.4 MG CAPS capsule Take 0.4 mg by mouth daily.    [name] Kitchen amLODipine (NORVASC) 5 MG tablet Take 1 tablet (5 mg total) by mouth daily. 90 tablet 2  . carbidopa-levodopa (SINEMET IR) 25-100 MG tablet Take by mouth.    [name] Kitchen MYRBETRIQ 50 MG TB24 tablet Take 1 tablet by mouth daily.     No facility-administered medications prior to visit.     Per HPI unless specifically indicated in ROS section below Review of Systems Objective:    BP 140/60 (BP Location: Left Arm, Patient Position: Sitting, Cuff Size: Large)  Pulse 67   Temp 97.9 F (36.6 C) (Temporal)   Ht 5\' 10"  (1.778 m)   Wt 234 lb 1 oz (106.2 kg)   SpO2 96%   BMI 33.58 kg/m   Wt Readings from Last 3 Encounters:  08/29/19 234 lb 1 oz (106.2 kg)  01/13/19 230 lb 6 oz (104.5 kg)  12/03/18 233 lb 12.8 oz (106.1 kg)    Physical Exam Vitals and nursing note reviewed.  Constitutional:      Appearance: Normal appearance. He is not ill-appearing.  Eyes:     Extraocular Movements: Extraocular movements intact.     Pupils: Pupils are equal, round, and reactive to light.  Neck:     Thyroid: No thyromegaly or thyroid tenderness.  Cardiovascular:     Rate and Rhythm: Normal rate and regular rhythm.     Pulses:  Normal pulses.     Heart sounds: Normal heart sounds. No murmur.  Pulmonary:     Effort: Pulmonary effort is normal. No respiratory distress.     Breath sounds: No wheezing, rhonchi or rales.     Comments: R lateral basilar crackles Abdominal:     General: Abdomen is flat. Bowel sounds are normal. There is no distension.     Palpations: Abdomen is soft. There is no hepatomegaly, splenomegaly or mass.     Tenderness: There is no abdominal tenderness. There is no right CVA tenderness, left CVA tenderness, guarding or rebound.     Hernia: No hernia is present.  Musculoskeletal:     Right lower leg: Edema (up to 2+) present.     Left lower leg: Edema (up to 2+) present.     Comments:  2+ DP bilaterally No calf pain  Skin:    General: Skin is warm and dry.     Findings: No rash.  Neurological:     Mental Status: He is alert.  Psychiatric:        Mood and Affect: Mood normal.        Behavior: Behavior normal.       Results for orders placed or performed in visit on 01/09/19  Hemoglobin A1c  Result Value Ref Range   Hgb A1c MFr Bld 5.7 4.6 - 6.5 %  Comprehensive metabolic panel  Result Value Ref Range   Sodium 139 135 - 145 mEq/L   Potassium 4.4 3.5 - 5.1 mEq/L   Chloride 103 96 - 112 mEq/L   CO2 32 19 - 32 mEq/L   Glucose, Bld 106 (H) 70 - 99 mg/dL   BUN 16 6 - 23 mg/dL   Creatinine, Ser 1.13 0.40 - 1.50 mg/dL   Total Bilirubin 0.7 0.2 - 1.2 mg/dL   Alkaline Phosphatase 37 (L) 39 - 117 U/L   AST 19 0 - 37 U/L   ALT 4 0 - 53 U/L   Total Protein 6.6 6.0 - 8.3 g/dL   Albumin 4.2 3.5 - 5.2 g/dL   Calcium 10.2 8.4 - 10.5 mg/dL   GFR 62.06 >60.00 mL/min  Lipid panel  Result Value Ref Range   Cholesterol 126 0 - 200 mg/dL   Triglycerides 94.0 0.0 - 149.0 mg/dL   HDL 40.80 >39.00 mg/dL   VLDL 18.8 0.0 - 40.0 mg/dL   LDL Cholesterol 66 0 - 99 mg/dL   Total CHOL/HDL Ratio 3    NonHDL 85.12    Assessment & Plan:  This visit occurred during the SARS-CoV-2 public health  emergency.  Safety protocols were in place, including screening  questions prior to the visit, additional usage of staff PPE, and extensive cleaning of exam room while observing appropriate contact time as indicated for disinfecting solutions.   Problem List Items Addressed This Visit    Pedal edema - Primary    Progressive over the past 2 months. Check labs today. Compression stockings help. Aware to limit salt, drink plenty of water, elevate legs. Possibly med related - will transition from amlodipine to ACEI. Reassess at 1 mo f/u visit. Pt agrees with plan.       Relevant Orders   Comprehensive metabolic panel   TSH   CBC with Differential/Platelet   Microalbumin / creatinine urine ratio   Parkinson's disease ([place])    Appreciate neuro care. Seems to have stable period.       Essential hypertension    Amlodipine helped control blood pressure but may be contributing to pedal edema - will change to lisinopril 10mg  daily. Reviewed watching for dry cough side effect or angioedema allergy. RTC 1 mo f/u visit. Check labs today, again 1 wk after starting ACEI.       Relevant Medications   lisinopril (ZESTRIL) 10 MG tablet   Other Relevant Orders   Basic metabolic panel       Meds ordered this encounter  Medications  . lisinopril (ZESTRIL) 10 MG tablet    Sig: Take 1 tablet (10 mg total) by mouth daily.    Dispense:  30 tablet    Refill:  3    To replace amlodipine   Orders Placed This Encounter  Procedures  . Comprehensive metabolic panel  . TSH  . CBC with Differential/Platelet  . Microalbumin / creatinine urine ratio  . Basic metabolic panel    Standing Status:   Future    Standing Expiration Date:   08/28/2020    Follow up plan: Return in about 4 weeks (around 09/26/2019) for follow up visit.  Ria Bush, MD

## 2019-08-29 NOTE — Assessment & Plan Note (Signed)
Progressive over the past 2 months. Check labs today. Compression stockings help. Aware to limit salt, drink plenty of water, elevate legs. Possibly med related - will transition from amlodipine to ACEI. Reassess at 1 mo f/u visit. Pt agrees with plan.

## 2019-09-04 ENCOUNTER — Other Ambulatory Visit: Payer: Self-pay

## 2019-09-04 ENCOUNTER — Other Ambulatory Visit (INDEPENDENT_AMBULATORY_CARE_PROVIDER_SITE_OTHER): Payer: Medicare Other

## 2019-09-04 DIAGNOSIS — I10 Essential (primary) hypertension: Secondary | ICD-10-CM | POA: Diagnosis not present

## 2019-09-04 LAB — BASIC METABOLIC PANEL
BUN: 14 mg/dL (ref 6–23)
CO2: 33 mEq/L — ABNORMAL HIGH (ref 19–32)
Calcium: 10 mg/dL (ref 8.4–10.5)
Chloride: 102 mEq/L (ref 96–112)
Creatinine, Ser: 0.99 mg/dL (ref 0.40–1.50)
GFR: 72.18 mL/min (ref >60.00)
Glucose, Bld: 105 mg/dL — ABNORMAL HIGH (ref 70–99)
Potassium: 4.2 mEq/L (ref 3.5–5.1)
Sodium: 137 mEq/L (ref 135–145)

## 2019-09-15 ENCOUNTER — Other Ambulatory Visit: Payer: Self-pay

## 2019-09-15 ENCOUNTER — Encounter: Payer: Self-pay | Admitting: Dermatology

## 2019-09-15 ENCOUNTER — Ambulatory Visit (INDEPENDENT_AMBULATORY_CARE_PROVIDER_SITE_OTHER): Payer: Medicare Other | Admitting: Dermatology

## 2019-09-15 DIAGNOSIS — L820 Inflamed seborrheic keratosis: Secondary | ICD-10-CM | POA: Diagnosis not present

## 2019-09-15 DIAGNOSIS — L719 Rosacea, unspecified: Secondary | ICD-10-CM

## 2019-09-15 DIAGNOSIS — Z85828 Personal history of other malignant neoplasm of skin: Secondary | ICD-10-CM

## 2019-09-15 DIAGNOSIS — L578 Other skin changes due to chronic exposure to nonionizing radiation: Secondary | ICD-10-CM | POA: Diagnosis not present

## 2019-09-15 DIAGNOSIS — L738 Other specified follicular disorders: Secondary | ICD-10-CM

## 2019-09-15 MED ORDER — DOXYCYCLINE HYCLATE 100 MG PO CAPS: 100.0000 mg | ORAL_CAPSULE | Freq: Every day | ORAL | 1 refills | Status: AC

## 2019-09-15 NOTE — Progress Notes (Signed)
   Follow-Up Visit   Subjective  Joshua Everett is a 85 y.o. male who presents for the following: spot (right nasal tip present for a few months. Lesion oozes and he uses Neosporin on it.). Patient also has an irritated spot on his right hand.   The following portions of the chart were reviewed this encounter and updated as appropriate:     Review of Systems: No other skin or systemic complaints.  Objective  Well appearing patient in no apparent distress; mood and affect are within normal limits.  A focused examination was performed including face. Relevant physical exam findings are noted in the Assessment and Plan.  Objective  R lower nasal dorsum: Well healed scar with no evidence of recurrence.   Objective  Right Hand Dorsum: Erythematous keratotic or waxy stuck-on papule or plaque.   Objective  Right Nasal tip: Pink crusted papule.  Images    Objective  Left nasal tip: 4.13mm flat flesh papule, slightly pearly  Images    Assessment & Plan   Actinic Damage - diffuse scaly erythematous macules with underlying dyspigmentation - Recommend daily broad spectrum sunscreen SPF 30+ to sun-exposed areas, reapply every 2 hours as needed.  - Call for new or changing lesions.   History of basal cell carcinoma (BCC) R lower nasal dorsum  Clear. Observe for recurrence. Call clinic for new or changing lesions.  Recommend regular skin exams, daily broad-spectrum spf 30+ sunscreen use, and photoprotection.     Inflamed seborrheic keratosis Right Hand Dorsum  Destruction of lesion - Right Hand Dorsum  Destruction method: cryotherapy   Informed consent: discussed and consent obtained   Lesion destroyed using liquid nitrogen: Yes   Region frozen until ice ball extended beyond lesion: Yes   Outcome: patient tolerated procedure well with no complications   Post-procedure details: wound care instructions given    Rosacea Right Nasal tip  Start metronidazole 0.75%  gel BID to nose. Pt has at home. Start doxycycline 100mg  take 1 po QD with food. If not improved on f/u, may biopsy  Doxycycline should be taken with food to prevent nausea. Do not lay down for 30 minutes after taking. Be cautious with sun exposure and use good sun protection while on this medication.   doxycycline (VIBRAMYCIN) 100 MG capsule - Right Nasal tip  Sebaceous hyperplasia Left nasal tip  Recheck on follow-up. May biopsy.  Return in about 1 month (around 10/15/2019) for recheck rosacea and SH on nose.   IJamesetta Orleans, CMA, am acting as scribe for Brendolyn Patty, MD .

## 2019-09-15 NOTE — Patient Instructions (Signed)
Cryotherapy Aftercare  . Wash gently with soap and water everyday.   . Apply Vaseline and Band-Aid daily until healed.  

## 2019-09-26 ENCOUNTER — Other Ambulatory Visit: Payer: Self-pay

## 2019-09-26 ENCOUNTER — Ambulatory Visit (INDEPENDENT_AMBULATORY_CARE_PROVIDER_SITE_OTHER): Payer: Medicare Other | Admitting: Family Medicine

## 2019-09-26 ENCOUNTER — Telehealth: Payer: Self-pay | Admitting: Family Medicine

## 2019-09-26 ENCOUNTER — Encounter: Payer: Self-pay | Admitting: Family Medicine

## 2019-09-26 VITALS — BP 156/74 | HR 64 | Temp 97.6°F | Ht 70.0 in | Wt 229.0 lb

## 2019-09-26 DIAGNOSIS — R600 Localized edema: Secondary | ICD-10-CM | POA: Diagnosis not present

## 2019-09-26 DIAGNOSIS — I10 Essential (primary) hypertension: Secondary | ICD-10-CM

## 2019-09-26 MED ORDER — LISINOPRIL-HYDROCHLOROTHIAZIDE 10-12.5 MG PO TABS: 1.0000 | ORAL_TABLET | Freq: Every day | ORAL | 1 refills | Status: DC

## 2019-09-26 MED ORDER — LISINOPRIL-HYDROCHLOROTHIAZIDE 10-12.5 MG PO TABS: 1.0000 | ORAL_TABLET | Freq: Every day | ORAL | 6 refills | Status: DC

## 2019-09-26 NOTE — Telephone Encounter (Signed)
Patient's spouse came to front desk. Requesting that a refill be sent to [facility]. She would like to know if he can get 90 days.

## 2019-09-26 NOTE — Assessment & Plan Note (Addendum)
BP elevated today with transition from amlodipine to lisinopril - will add hctz to regimen as per instructions. Update if dizziness or other hypotensive symptoms develop. Check labs next visit

## 2019-09-26 NOTE — Assessment & Plan Note (Signed)
Improved with compression stocking use and coming off amlodipine. Continue compression.

## 2019-09-26 NOTE — Progress Notes (Signed)
This visit was conducted in person.  BP (!) 156/74 (BP Location: Right Arm, Patient Position: Sitting, Cuff Size: Large)   Pulse 64   Temp 97.6 F (36.4 C) (Temporal)   Ht 5\' 10"  (1.778 m)   Wt 229 lb (103.9 kg)   SpO2 95%   BMI 32.86 kg/m   BP Readings from Last 3 Encounters:  09/26/19 (!) 156/74  08/29/19 140/60  01/23/19 (!) 176/78    CC: f/u pedaledema Subjective:    Patient ID: Joshua Everett, male    DOB: 05-Jan-1935, 85 y.o.   MRN: [id number]  HPI: Joshua Everett is a 85 y.o. male presenting on 09/26/2019 for Edema (Here for 4 wk pedal edema f/u.)   See prior note for details.  Seen here late last month with 2 months of bilateral pedal edema L>R. Labs were largely unrevealing. Treated with transitioning from amlodipine to ACEI with planned f/u today. He hasn't really noted any difference. Now regularly using compression stockings. He has not been checking BP at home.   Amlodipine started 01/2019 due to uncontrolled hypertension.   Known PD on sinemet IR (6 tab/day) followed by Dr Manuella Ghazi.  Incontinence - sees Dr Diona Fanti urology - receiving PTNS as well as flomax. Insurance stopped covring PTNS. Received 40 treatments. Myrbetriq was stopped recently. Also receiving desmopressin.      Relevant past medical, surgical, family and social history reviewed and updated as indicated. Interim medical history since our last visit reviewed. Allergies and medications reviewed and updated. Outpatient Medications Prior to Visit  Medication Sig Dispense Refill  . B Complex Vitamins (VITAMIN B COMPLEX PO) Take 1 tablet by mouth daily.    . bimatoprost (LUMIGAN) 0.01 % SOLN Place 1 drop into both eyes at bedtime.     . carbidopa-levodopa (SINEMET IR) 25-100 MG tablet Take by mouth.    . cholecalciferol (VITAMIN D) 1000 units tablet Take 1,000 Units by mouth daily.     . cycloSPORINE (RESTASIS) 0.05 % ophthalmic emulsion Place 1 drop into both eyes 2 (two) times daily.    [name] Kitchen  desmopressin (DDAVP) 0.2 MG tablet Take 0.6 mg by mouth at bedtime.     [name] Kitchen doxycycline (VIBRAMYCIN) 100 MG capsule Take 1 capsule (100 mg total) by mouth daily. Take with food. 30 capsule 1  . fenofibrate 160 MG tablet Take 1 tablet (160 mg total) by mouth daily. 90 tablet 3  . linaclotide (LINZESS) 290 MCG CAPS capsule Take 1 capsule (290 mcg total) by mouth daily before breakfast. 90 capsule 3  . Multiple Vitamins-Minerals (MULTIVITAMIN ADULT PO) Take 1 tablet by mouth daily.    . NON FORMULARY Take 2 drops by mouth daily. TINCTURE OF GARLIC    . pantoprazole (PROTONIX) 40 MG tablet Take 1 tablet (40 mg total) by mouth daily. 90 tablet 3  . simvastatin (ZOCOR) 40 MG tablet TAKE 1 TABLET DAILY AT 6 P.M. 90 tablet 3  . tamsulosin (FLOMAX) 0.4 MG CAPS capsule Take 0.4 mg by mouth daily.    [name] Kitchen lisinopril (ZESTRIL) 10 MG tablet Take 1 tablet (10 mg total) by mouth daily. 30 tablet 3   No facility-administered medications prior to visit.     Per HPI unless specifically indicated in ROS section below Review of Systems Objective:    BP (!) 156/74 (BP Location: Right Arm, Patient Position: Sitting, Cuff Size: Large)   Pulse 64   Temp 97.6 F (36.4 C) (Temporal)   Ht 5\' 10"  (1.778 m)   Wt  229 lb (103.9 kg)   SpO2 95%   BMI 32.86 kg/m   Wt Readings from Last 3 Encounters:  09/26/19 229 lb (103.9 kg)  08/29/19 234 lb 1 oz (106.2 kg)  01/13/19 230 lb 6 oz (104.5 kg)    Physical Exam Vitals and nursing note reviewed.  Constitutional:      Appearance: Normal appearance. He is not ill-appearing.  Cardiovascular:     Rate and Rhythm: Normal rate and regular rhythm.     Pulses: Normal pulses.     Heart sounds: Normal heart sounds. No murmur.  Pulmonary:     Effort: Pulmonary effort is normal. No respiratory distress.     Breath sounds: Normal breath sounds. No wheezing, rhonchi or rales.  Musculoskeletal:     Right lower leg: Edema present.     Left lower leg: Edema present.     Comments:   Compression stockings in place Tr-1+ pitting edema bilaterally - significant improvement since last month  Neurological:     Mental Status: He is alert.       Results for orders placed or performed in visit on [id number]  Basic metabolic panel  Result Value Ref Range   Sodium 137 135 - 145 mEq/L   Potassium 4.2 3.5 - 5.1 mEq/L   Chloride 102 96 - 112 mEq/L   CO2 33 (H) 19 - 32 mEq/L   Glucose, Bld 105 (H) 70 - 99 mg/dL   BUN 14 6 - 23 mg/dL   Creatinine, Ser 0.99 0.40 - 1.50 mg/dL   GFR 72.18 >60.00 mL/min   Calcium 10.0 8.4 - 10.5 mg/dL   Assessment & Plan:  This visit occurred during the SARS-CoV-2 public health emergency.  Safety protocols were in place, including screening questions prior to the visit, additional usage of staff PPE, and extensive cleaning of exam room while observing appropriate contact time as indicated for disinfecting solutions.   Problem List Items Addressed This Visit    Pedal edema - Primary    Improved with compression stocking use and coming off amlodipine. Continue compression.       Essential hypertension    BP elevated today with transition from amlodipine to lisinopril - will add hctz to regimen as per instructions. Update if dizziness or other hypotensive symptoms develop. Check labs next visit      Relevant Medications   lisinopril-hydrochlorothiazide (ZESTORETIC) 10-12.5 MG tablet       Meds ordered this encounter  Medications  . lisinopril-hydrochlorothiazide (ZESTORETIC) 10-12.5 MG tablet    Sig: Take 1 tablet by mouth daily.    Dispense:  30 tablet    Refill:  6    To replace plain lisinopril   No orders of the defined types were placed in this encounter.   Patient Instructions  We are going to add second medicine as a combo pill to your current lisinopril 10mg  daily. This will be lisinopril/hydrochlorothiazide 10/12.5mg  one daily.  Return in 2 months for a follow up visit to recheck blood pressure and leg swelling.  Start  checking BP levels at home - goal <140/90 ideally. Let us know if too low, or low blood pressure symptoms develop such as dizziness with standing.    Follow up plan: Return in about 2 months (around 11/26/2019) for follow up visit.  Ria Bush, MD

## 2019-09-26 NOTE — Patient Instructions (Addendum)
We are going to add second medicine as a combo pill to your current lisinopril 10mg  daily. This will be lisinopril/hydrochlorothiazide 10/12.5mg  one daily.  Return in 2 months for a follow up visit to recheck blood pressure and leg swelling.  Start checking BP levels at home - goal <140/90 ideally. Let us know if too low, or low blood pressure symptoms develop such as dizziness with standing.

## 2019-09-26 NOTE — Telephone Encounter (Signed)
E-scribed 90-day lisinopril-HCTZ rx to [facility].

## 2019-10-21 ENCOUNTER — Encounter: Payer: Self-pay | Admitting: Dermatology

## 2019-10-21 ENCOUNTER — Other Ambulatory Visit: Payer: Self-pay

## 2019-10-21 ENCOUNTER — Ambulatory Visit (INDEPENDENT_AMBULATORY_CARE_PROVIDER_SITE_OTHER): Payer: Medicare Other | Admitting: Dermatology

## 2019-10-21 DIAGNOSIS — D1801 Hemangioma of skin and subcutaneous tissue: Secondary | ICD-10-CM

## 2019-10-21 DIAGNOSIS — L814 Other melanin hyperpigmentation: Secondary | ICD-10-CM

## 2019-10-21 DIAGNOSIS — Z86007 Personal history of in-situ neoplasm of skin: Secondary | ICD-10-CM

## 2019-10-21 DIAGNOSIS — L820 Inflamed seborrheic keratosis: Secondary | ICD-10-CM | POA: Diagnosis not present

## 2019-10-21 DIAGNOSIS — L720 Epidermal cyst: Secondary | ICD-10-CM | POA: Diagnosis not present

## 2019-10-21 DIAGNOSIS — L821 Other seborrheic keratosis: Secondary | ICD-10-CM

## 2019-10-21 DIAGNOSIS — L570 Actinic keratosis: Secondary | ICD-10-CM | POA: Diagnosis not present

## 2019-10-21 DIAGNOSIS — Z1283 Encounter for screening for malignant neoplasm of skin: Secondary | ICD-10-CM

## 2019-10-21 DIAGNOSIS — D229 Melanocytic nevi, unspecified: Secondary | ICD-10-CM

## 2019-10-21 DIAGNOSIS — L853 Xerosis cutis: Secondary | ICD-10-CM

## 2019-10-21 DIAGNOSIS — Z85828 Personal history of other malignant neoplasm of skin: Secondary | ICD-10-CM | POA: Diagnosis not present

## 2019-10-21 DIAGNOSIS — L578 Other skin changes due to chronic exposure to nonionizing radiation: Secondary | ICD-10-CM

## 2019-10-21 DIAGNOSIS — L719 Rosacea, unspecified: Secondary | ICD-10-CM

## 2019-10-21 NOTE — Progress Notes (Signed)
Follow-Up Visit   Subjective  Joshua Everett is a 85 y.o. male who presents for the following: Annual Exam (UBSE). Patient has a history of AKs, BCCs, and SCCs. Recheck BCC treated by [facility] on R lower nasal dorsum last visit.  Also has spot that gets irritated by shaving on jaw.  The following portions of the chart were reviewed this encounter and updated as appropriate:      Review of Systems:  No other skin or systemic complaints except as noted in HPI or Assessment and Plan.  Objective  Well appearing patient in no apparent distress; mood and affect are within normal limits.  All skin waist up examined.  Objective  Nasal tip x 3, L lat cheek x 2 (5): Erythematous thin papules/macules with gritty scale.   Objective  Right Inferior Jaw: 7.42mm firm sq nodule  Objective  Right Lower Nasal Dorsum, multiple see history: Well healed scar with no evidence of recurrence.   Objective  Right Dorsal Hand: Well healed scar with no evidence of recurrence   Objective  Right jaw x 1: Erythematous keratotic or waxy stuck-on papule or plaque.   Objective  Face: Erythema and telangiectasias of nose, with sebaceous hypertrophy.   Assessment & Plan  AK (actinic keratosis) (5) Nasal tip x 3, L lat cheek x 2  Destruction of lesion - Nasal tip x 3, L lat cheek x 2  Destruction method: cryotherapy   Informed consent: discussed and consent obtained   Lesion destroyed using liquid nitrogen: Yes   Region frozen until ice ball extended beyond lesion: Yes   Outcome: patient tolerated procedure well with no complications   Post-procedure details: wound care instructions given    Epidermal inclusion cyst Right Inferior Jaw  Benign, observe.  Discussed excision if symptomatic or changing  History of basal cell carcinoma (BCC) Right Lower Nasal Dorsum, multiple see history  Clear. Observe for recurrence. Call clinic for new or changing lesions.  Recommend regular skin exams,  daily broad-spectrum spf 30+ sunscreen use, and photoprotection.     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 Observe for recurrence. Call clinic for new or changing lesions.  Recommend regular skin exams, daily broad-spectrum spf 30+ sunscreen use, and photoprotection.     Inflamed seborrheic keratosis Right jaw x 1  Destruction of lesion - Right jaw x 1  Destruction method: cryotherapy   Informed consent: discussed and consent obtained   Lesion destroyed using liquid nitrogen: Yes   Region frozen until ice ball extended beyond lesion: Yes   Outcome: patient tolerated procedure well with no complications   Post-procedure details: wound care instructions given    Rosacea Face  Continue metronidazole 0.75% gel qd/bid Cont doxycycline prn flares  Skin cancer screening performed today. History of Squamous Cell Carcinoma of the Skin- multiple see history - No evidence of recurrence today - Recommend regular full body skin exams - Recommend daily broad spectrum sunscreen SPF 30+ to sun-exposed areas, reapply every 2 hours as needed.  - Call if any new or changing lesions are noted between office visits   Actinic Damage - diffuse scaly erythematous macules with underlying dyspigmentation - Recommend daily broad spectrum sunscreen SPF 30+ to sun-exposed areas, reapply every 2 hours as needed.  - Call for new or changing lesions. Melanocytic Nevi - Tan-brown and/or pink-flesh-colored symmetric macules and papules - Benign appearing on exam today - Observation - Call clinic for new or changing moles -  Recommend daily use of broad spectrum spf 30+ sunscreen to sun-exposed areas.   Hemangiomas - Red papules - Discussed benign nature - Observe - Call for any changes  Seborrheic Keratoses - Stuck-on, waxy, tan-brown papules and plaques  - Discussed benign etiology and prognosis. - Observe - Call for any changes  Lentigines -  Scattered tan macules - Discussed due to sun exposure - Benign, observe - Call for any changes  Xerosis - diffuse xerotic patches - recommend gentle, hydrating skin care - continue GB rough and bumpy cream daily    Return in about 6 months (around 04/22/2020) for AKs, recheck [place].  IJamesetta Orleans, CMA, am acting as scribe for Brendolyn Patty, MD .  Documentation: I have reviewed the above documentation for accuracy and completeness, and I agree with the above.  Brendolyn Patty MD

## 2019-10-21 NOTE — Patient Instructions (Signed)
Cryotherapy Aftercare  . Wash gently with soap and water everyday.   . Apply Vaseline and Band-Aid daily until healed.  

## 2019-12-01 ENCOUNTER — Other Ambulatory Visit: Payer: Self-pay

## 2019-12-01 ENCOUNTER — Encounter: Payer: Self-pay | Admitting: Family Medicine

## 2019-12-01 ENCOUNTER — Ambulatory Visit (INDEPENDENT_AMBULATORY_CARE_PROVIDER_SITE_OTHER): Payer: Medicare Other | Admitting: Family Medicine

## 2019-12-01 VITALS — BP 152/66 | HR 58 | Temp 98.2°F | Ht 70.0 in | Wt 220.6 lb

## 2019-12-01 DIAGNOSIS — I10 Essential (primary) hypertension: Secondary | ICD-10-CM

## 2019-12-01 DIAGNOSIS — N3281 Overactive bladder: Secondary | ICD-10-CM | POA: Diagnosis not present

## 2019-12-01 DIAGNOSIS — N400 Enlarged prostate without lower urinary tract symptoms: Secondary | ICD-10-CM | POA: Diagnosis not present

## 2019-12-01 DIAGNOSIS — K5909 Other constipation: Secondary | ICD-10-CM | POA: Diagnosis not present

## 2019-12-01 DIAGNOSIS — R600 Localized edema: Secondary | ICD-10-CM

## 2019-12-01 MED ORDER — LISINOPRIL-HYDROCHLOROTHIAZIDE 20-12.5 MG PO TABS: 1.0000 | ORAL_TABLET | Freq: Every day | ORAL | 2 refills | Status: DC

## 2019-12-01 NOTE — Assessment & Plan Note (Signed)
Appreciate uro care ([place])

## 2019-12-01 NOTE — Assessment & Plan Note (Signed)
Appreciate uro care. Now off myrbetriq and flomax, on desmopressin and generic vesicare.

## 2019-12-01 NOTE — Assessment & Plan Note (Signed)
Continue compression stockings and hctz. Better off amlodipine.

## 2019-12-01 NOTE — Patient Instructions (Addendum)
You are doing well today  Increase BP medicine to lisinopril 20/hydrochlorothiazide 12.5mg  - new dose will be at pharmacy.  Continue bowel regimen as up to now.  Keep upcoming physical appointment.

## 2019-12-01 NOTE — Assessment & Plan Note (Signed)
Chronic, above goal. Will increase ACEI to 20mg  lisinopril, continue hctz 12.5mg  daily. Check Cr at CPE in 6 wks.

## 2019-12-01 NOTE — Progress Notes (Signed)
This visit was conducted in person.  BP (!) 152/66 (BP Location: Right Arm, Patient Position: Sitting, Cuff Size: Normal)   Pulse (!) 58   Temp 98.2 F (36.8 C) (Temporal)   Ht 5\' 10"  (1.778 m)   Wt 220 lb 9 oz (100 kg)   SpO2 96%   BMI 31.65 kg/m    CC: 2 mo f/u visit  Subjective:    Patient ID: Joshua Everett, male    DOB: 1935-03-07, 85 y.o.   MRN: [id number]  HPI: Joshua Everett is a 85 y.o. male presenting on 12/01/2019 for Hypertension (Here for 2 mo f/u.  Pt accompanied by wife, Joshua Everett- temp 97.9.)   See prior note for details.  Seen here 2 mo ago for ongoing pedal edema L>R. Evaluation (labs) largely unrevealing. We also transitioned antihypertensives from amlodipine to ACEI without much benefit noted. He also started using compression stockings more regularly. HCTZ was started 09/2019 due to persistently elevated readings. Amlodipine previously started 01/2019 due to uncontrolled hypertension. Not regularly checking BP.   Now off flomax and myrbetriq, now only on solifenacin (generic for vesicare) and desmopressin. Some constipation with vesicare - takes colace 100mg  as well as linzess and miralax PRN. He also takes fiber capsule and plenty of water daily.      Relevant past medical, surgical, family and social history reviewed and updated as indicated. Interim medical history since our last visit reviewed. Allergies and medications reviewed and updated. Outpatient Medications Prior to Visit  Medication Sig Dispense Refill  . B Complex Vitamins (VITAMIN B COMPLEX PO) Take 1 tablet by mouth daily.    . bimatoprost (LUMIGAN) 0.01 % SOLN Place 1 drop into both eyes at bedtime.     . carbidopa-levodopa (SINEMET IR) 25-100 MG tablet Take by mouth.    . cholecalciferol (VITAMIN D) 1000 units tablet Take 1,000 Units by mouth daily.     . cycloSPORINE (RESTASIS) 0.05 % ophthalmic emulsion Place 1 drop into both eyes 2 (two) times daily.    [name] Kitchen desmopressin (DDAVP) 0.2 MG  tablet Take 0.6 mg by mouth at bedtime.     . fenofibrate 160 MG tablet Take 1 tablet (160 mg total) by mouth daily. 90 tablet 3  . linaclotide (LINZESS) 290 MCG CAPS capsule Take 1 capsule (290 mcg total) by mouth daily before breakfast. 90 capsule 3  . Multiple Vitamins-Minerals (MULTIVITAMIN ADULT PO) Take 1 tablet by mouth daily.    . NON FORMULARY Take 2 drops by mouth daily. TINCTURE OF GARLIC    . pantoprazole (PROTONIX) 40 MG tablet Take 1 tablet (40 mg total) by mouth daily. 90 tablet 3  . simvastatin (ZOCOR) 40 MG tablet TAKE 1 TABLET DAILY AT 6 P.M. 90 tablet 3  . solifenacin (VESICARE) 10 MG tablet     . lisinopril-hydrochlorothiazide (ZESTORETIC) 10-12.5 MG tablet Take 1 tablet by mouth daily. 90 tablet 1  . tamsulosin (FLOMAX) 0.4 MG CAPS capsule Take 0.4 mg by mouth daily. (Patient not taking: Reported on 12/01/2019)     No facility-administered medications prior to visit.     Per HPI unless specifically indicated in ROS section below Review of Systems Objective:  BP (!) 152/66 (BP Location: Right Arm, Patient Position: Sitting, Cuff Size: Normal)   Pulse (!) 58   Temp 98.2 F (36.8 C) (Temporal)   Ht 5\' 10"  (1.778 m)   Wt 220 lb 9 oz (100 kg)   SpO2 96%   BMI 31.65 kg/m   Wt  Readings from Last 3 Encounters:  12/01/19 220 lb 9 oz (100 kg)  09/26/19 229 lb (103.9 kg)  08/29/19 234 lb 1 oz (106.2 kg)      Physical Exam Vitals and nursing note reviewed.  Constitutional:      Appearance: Normal appearance. He is not ill-appearing.  Eyes:     Extraocular Movements: Extraocular movements intact.     Pupils: Pupils are equal, round, and reactive to light.  Cardiovascular:     Rate and Rhythm: Normal rate and regular rhythm.     Pulses: Normal pulses.     Heart sounds: Normal heart sounds. No murmur heard.   Pulmonary:     Effort: Pulmonary effort is normal. No respiratory distress.     Breath sounds: Normal breath sounds. No wheezing, rhonchi or rales.    Musculoskeletal:     Right lower leg: Edema (tr) present.     Left lower leg: Edema (tr) present.     Comments: Compression stockings on  Skin:    General: Skin is warm and dry.     Findings: No rash.  Neurological:     Mental Status: He is alert.  Psychiatric:        Mood and Affect: Mood normal.        Behavior: Behavior normal.       Results for orders placed or performed in visit on 37/16/96  Basic metabolic panel  Result Value Ref Range   Sodium 137 135 - 145 mEq/L   Potassium 4.2 3.5 - 5.1 mEq/L   Chloride 102 96 - 112 mEq/L   CO2 33 (H) 19 - 32 mEq/L   Glucose, Bld 105 (H) 70 - 99 mg/dL   BUN 14 6 - 23 mg/dL   Creatinine, Ser 0.99 0.40 - 1.50 mg/dL   GFR 72.18 >60.00 mL/min   Calcium 10.0 8.4 - 10.5 mg/dL   Assessment & Plan:  This visit occurred during the SARS-CoV-2 public health emergency.  Safety protocols were in place, including screening questions prior to the visit, additional usage of staff PPE, and extensive cleaning of exam room while observing appropriate contact time as indicated for disinfecting solutions.   Problem List Items Addressed This Visit    Pedal edema    Continue compression stockings and hctz. Better off amlodipine.       Overactive bladder    Appreciate uro care. Now off myrbetriq and flomax, on desmopressin and generic vesicare.       Essential hypertension - Primary    Chronic, above goal. Will increase ACEI to 20mg  lisinopril, continue hctz 12.5mg  daily. Check Cr at CPE in 6 wks.      Relevant Medications   lisinopril-hydrochlorothiazide (ZESTORETIC) 20-12.5 MG tablet   Chronic constipation    Reviewed importance of bowel regimen - colace, miralax, linzess, water and fiber supplement      BPH without obstruction/lower urinary tract symptoms    Appreciate uro care ([place])          Meds ordered this encounter  Medications  . lisinopril-hydrochlorothiazide (ZESTORETIC) 20-12.5 MG tablet    Sig: Take 1 tablet by mouth  daily.    Dispense:  90 tablet    Refill:  2   No orders of the defined types were placed in this encounter.   Patient Instructions  You are doing well today  Increase BP medicine to lisinopril 20/hydrochlorothiazide 12.5mg  - new dose will be at pharmacy.  Continue bowel regimen as up to now.  Keep upcoming  physical appointment.    Follow up plan: Return if symptoms worsen or fail to improve.  Ria Bush, MD

## 2019-12-01 NOTE — Assessment & Plan Note (Signed)
Reviewed importance of bowel regimen - colace, miralax, linzess, water and fiber supplement

## 2019-12-04 ENCOUNTER — Other Ambulatory Visit: Payer: Self-pay

## 2019-12-04 NOTE — Telephone Encounter (Signed)
Noted  

## 2019-12-04 NOTE — Telephone Encounter (Signed)
Patient's wife contacted the office and states she contacted [facility] and the medication issue with his Lisinopril has been resolved. Nothing further needed.

## 2019-12-04 NOTE — Telephone Encounter (Signed)
Mrs Hardcastle signed) left v/m that [facility] (contact # [phone]) refuse to fill new rx for lisinopril with higher dosage. Mrs Goffe will try to contact [facility] but may require call from [place] to get straightened out so pt can get his med. See 12/01/19 office note. FYI to [facility] CMA.

## 2020-01-06 ENCOUNTER — Telehealth: Payer: Self-pay

## 2020-01-06 ENCOUNTER — Other Ambulatory Visit: Payer: Self-pay

## 2020-01-06 ENCOUNTER — Ambulatory Visit
Admission: EM | Admit: 2020-01-06 | Discharge: 2020-01-06 | Disposition: A | Discharge status: Home / Self Care | Payer: Medicare Other | Attending: Emergency Medicine | Admitting: Emergency Medicine

## 2020-01-06 DIAGNOSIS — R410 Disorientation, unspecified: Secondary | ICD-10-CM

## 2020-01-06 DIAGNOSIS — G20 Parkinson's disease: Secondary | ICD-10-CM

## 2020-01-06 LAB — POCT URINALYSIS DIP (MANUAL ENTRY)
Bilirubin, UA: NEGATIVE
Blood, UA: NEGATIVE
Glucose, UA: NEGATIVE mg/dL
Ketones, POC UA: NEGATIVE mg/dL
Leukocytes, UA: NEGATIVE
Nitrite, UA: NEGATIVE
Protein Ur, POC: NEGATIVE mg/dL
Spec Grav, UA: 1.025 (ref 1.010–1.025)
Urobilinogen, UA: 1 E.U./dL
pH, UA: 6.5 (ref 5.0–8.0)

## 2020-01-06 NOTE — Telephone Encounter (Signed)
Patient's wife left a voicemail sating that they did go to the walk-in clinic today and the test for a UTI was negative. Patient's wife stated that there was a question about a stroke and he passed that test at the [facility]. Patient's wife stated that he seems to be fine this afternoon. Patient's wife stated that the NP thinks that his symptoms may be early dementia. Patient's wife stated that is going to see neurology. .Patient's wife stated that he has an upcoming appointment scheduled with Dr. Danise Mina and will know more at that visit after seeing the neurologist.

## 2020-01-06 NOTE — Discharge Instructions (Signed)
Your urine does not show signs of infection today.    Go to the emergency department if you have increased confusion or develop new concerning symptoms.    Follow-up with your primary care provider or neurologist.

## 2020-01-06 NOTE — ED Provider Notes (Signed)
Joshua Everett    CSN: [id number] Arrival date & time: 01/06/20  3419      History   Chief Complaint Chief Complaint  Patient presents with  . Altered Mental Status    HPI Delvonte Everett is a 85 y.o. male.   Accompanied by his wife, patient presents with increased confusion this morning.  He has Parkinson's.  His wife reports he is normally confused in the mornings but it resolves quickly.  This morning his confusion lasted; she contacted his PCP and was instructed to come here for urine test to make sure he did not have a UTI.  Patient and his wife deny fever, chills, discomfort, chest pain, shortness of breath, abdominal pain, dysuria, rash, focal weakness, numbness, or other symptoms.  The history is provided by the patient.    Past Medical History:  Diagnosis Date  . Basal cell carcinoma 09/03/2014   right of midline forehead  . Basal cell carcinoma 03/25/2019   right lower nasal dorsum, EDC  . Basal cell carcinoma (BCC) 01/28/2015   right proximal nasal alar rim  . BPH without obstruction/lower urinary tract symptoms 09/08/2014  . Chronic constipation 09/08/2014  . GERD (gastroesophageal reflux disease)   . Glaucoma   . History of chicken pox   . Hyperlipidemia   . Obesity, Class I, BMI 30-34.9 09/08/2014  . Prediabetes 07/31/2015  . Rosacea 07/06/2015  . Squamous cell carcinoma of skin 01/30/2007   mid back, in situ  . Squamous cell carcinoma of skin 03/12/2018   right forearm, in situ  . Squamous cell carcinoma of skin 04/22/2018   left pretibial  . Squamous cell carcinoma of skin 09/24/2018   right hand dorsum, in situ  . Urine incontinence     Patient Active Problem List   Diagnosis Date Noted  . Pedal edema 08/29/2019  . Medicare annual wellness visit, subsequent 01/13/2019  . Parkinson's disease ([place]) 03/12/2018  . Abnormal gait 01/04/2018  . Overactive bladder 05/21/2017  . Chronic pain of right knee 01/01/2017  . Chest pain with moderate risk  for cardiac etiology 08/17/2016  . Advanced care planning/counseling discussion 01/23/2016  . Prediabetes 07/31/2015  . Rosacea 07/06/2015  . Personal history of other malignant neoplasm of skin 11/19/2014  . Abdominal wall hernia 09/08/2014  . BPH without obstruction/lower urinary tract symptoms 09/08/2014  . Chronic constipation 09/08/2014  . Dyslipidemia 09/08/2014  . Glaucoma 09/08/2014  . GERD (gastroesophageal reflux disease) 09/08/2014  . Essential hypertension 09/08/2014  . Obesity, Class I, BMI 30-34.9 09/08/2014  . Nocturia 11/13/2012    Past Surgical History:  Procedure Laterality Date  . COLONOSCOPY  03/2009   1 TA, diverticulosis, rpt 5 yrs (Dr [facility])  . ESOPHAGOGASTRODUODENOSCOPY  03/2009   small HH, gastric polyps biopsied, dilation for dysphagia ([place])  . [place] Medications    Prior to Admission medications   Medication Sig Start Date End Date Taking? Authorizing Provider  B Complex Vitamins (VITAMIN B COMPLEX PO) Take 1 tablet by mouth daily.    [provider]  bimatoprost (LUMIGAN) 0.01 % SOLN Place 1 drop into both eyes at bedtime.     [provider]  carbidopa-levodopa (SINEMET IR) 25-100 MG tablet Take by mouth. 03/08/18 12/01/19  [provider]  cholecalciferol (VITAMIN D) 1000 units tablet Take 1,000 Units by mouth daily.     [provider]  cycloSPORINE (RESTASIS) 0.05 %  ophthalmic emulsion Place 1 drop into both eyes 2 (two) times daily.    [provider]  desmopressin (DDAVP) 0.2 MG tablet Take 0.6 mg by mouth at bedtime.     [provider]  fenofibrate 160 MG tablet Take 1 tablet (160 mg total) by mouth daily. 01/13/19   Ria Bush, MD  linaclotide [facility]) 290 MCG CAPS capsule Take 1 capsule (290 mcg total) by mouth daily before breakfast. 04/01/19   Lucilla Lame, MD  lisinopril-hydrochlorothiazide (ZESTORETIC) 20-12.5  MG tablet Take 1 tablet by mouth daily. 12/01/19   Ria Bush, MD  Multiple Vitamins-Minerals (MULTIVITAMIN ADULT PO) Take 1 tablet by mouth daily.    [provider]  NON FORMULARY Take 2 drops by mouth daily. TINCTURE OF GARLIC    [provider]  pantoprazole (PROTONIX) 40 MG tablet Take 1 tablet (40 mg total) by mouth daily. 12/03/18   Lucilla Lame, MD  simvastatin (ZOCOR) 40 MG tablet TAKE 1 TABLET DAILY AT 6 P.M. 01/13/19   Ria Bush, MD  solifenacin (VESICARE) 10 MG tablet  10/22/19   [provider]    Family History Family History  Problem Relation Age of Onset  . Diabetes Mother   . Alzheimer's disease Mother 61  . Kidney disease Father   . Cancer Father        brain tumor  . Cancer Sister        breast    Social History Social History   Tobacco Use  . Smoking status: Former Smoker    Quit date: 06/05/1968    Years since quitting: 51.6  . Smokeless tobacco: Never Used  Substance Use Topics  . Alcohol use: Yes    Alcohol/week: 0.0 standard drinks    Comment: minimal  . Drug use: No     Allergies   Patient has no known allergies.   Review of Systems Review of Systems  Constitutional: Negative for chills and fever.  HENT: Negative for ear pain and sore throat.   Eyes: Negative for pain and visual disturbance.  Respiratory: Negative for cough and shortness of breath.   Cardiovascular: Negative for chest pain and palpitations.  Gastrointestinal: Negative for abdominal pain and vomiting.  Genitourinary: Negative for dysuria and hematuria.  Musculoskeletal: Negative for arthralgias and back pain.  Skin: Negative for color change and rash.  Neurological: Negative for dizziness, seizures, syncope, facial asymmetry, speech difficulty, weakness and numbness.       Confusion  All other systems reviewed and are negative.    Physical Exam Triage Vital Signs ED Triage Vitals  Enc Vitals Group     BP      Pulse      Resp       Temp      Temp src      SpO2      Weight      Height      Head Circumference      Peak Flow      Pain Score      Pain Loc      Pain Edu?      Excl. in [place]?    No data found.  Updated Vital Signs BP 120/79 (BP Location: Right Arm)   Pulse 77   Temp 98.6 F (37 C) (Oral)   Resp 16   SpO2 96%   Visual Acuity Right Eye Distance:   Left Eye Distance:   Bilateral Distance:    Right Eye Near:   Left Eye Near:  Bilateral Near:     Physical Exam Vitals and nursing note reviewed.  Constitutional:      General: He is not in acute distress.    Appearance: He is well-developed. He is not ill-appearing.  HENT:     Head: Normocephalic and atraumatic.     Mouth/Throat:     Mouth: Mucous membranes are moist.     Pharynx: Oropharynx is clear.  Eyes:     Conjunctiva/sclera: Conjunctivae normal.  Cardiovascular:     Rate and Rhythm: Normal rate and regular rhythm.     Heart sounds: Normal heart sounds. No murmur heard.   Pulmonary:     Effort: Pulmonary effort is normal. No respiratory distress.     Breath sounds: Normal breath sounds. No wheezing or rhonchi.  Abdominal:     Palpations: Abdomen is soft.     Tenderness: There is no abdominal tenderness. There is no guarding or rebound.  Musculoskeletal:     Cervical back: Neck supple.  Skin:    General: Skin is warm and dry.     Findings: No rash.  Neurological:     Mental Status: He is alert. Mental status is at baseline.     Cranial Nerves: No cranial nerve deficit.     Sensory: No sensory deficit.     Motor: No weakness.     Comments: Oriented to person, place, season, and year.  No focal weakness.    Psychiatric:        Mood and Affect: Mood normal.        Behavior: Behavior normal.      UC Treatments / Results  Labs (all labs ordered are listed, but only abnormal results are displayed) Labs Reviewed  POCT URINALYSIS DIP (MANUAL ENTRY)    EKG   Radiology No results found.  Procedures Procedures  (including critical care time)  Medications Ordered in UC Medications - No data to display  Initial Impression / Assessment and Plan / UC Course  I have reviewed the triage vital signs and the nursing notes.  Pertinent labs & imaging results that were available during my care of the patient were reviewed by me and considered in my medical decision making (see chart for details).   Confusion, Parkinson's disease.  Urine does not show signs of infection.  No evidence of stroke.  Discussed with patient's wife that she should take him to the emergency department if he has signs of stroke, increased confusion, or other concerning symptoms.  Instructed her to follow-up with his PCP and neurologist.  Wife and patient agree to plan of care.   Final Clinical Impressions(s) / UC Diagnoses   Final diagnoses:  Confusion  Parkinson's disease [facility])     Discharge Instructions     Your urine does not show signs of infection today.    Go to the emergency department if you have increased confusion or develop new concerning symptoms.    Follow-up with your primary care provider or neurologist.        ED Prescriptions    None     PDMP not reviewed this encounter.   Sharion Balloon, NP 01/06/20 1023

## 2020-01-06 NOTE — ED Triage Notes (Signed)
Pt BIB family for change in mental status. Pt has history of parkinson's and is not at baseline per pt family member. Per family member pt is typically confused in the morning but comes around in the morning, and did come out of confusion today. Per PCP pt needs to have urine checked for possible UTI. Per pt's family member pt is not oriented to time, and typically is. Pt denies lower abdominal pain. Per wife pt complained of right sided pain yesterday, relieved by tums.

## 2020-01-06 NOTE — Telephone Encounter (Signed)
Seen at [facility], tested negative for UTI.  plz call for update on symptoms, offer in office appointment for further evaluation if ongoing confusion. Would also recommend they touch base with neurology.

## 2020-01-06 NOTE — Telephone Encounter (Signed)
Joshua Everett, pt's wife called with concerns of the pt having increase in confusion this morning... denies any stroke symptoms such as trouble walking, speaking, and understanding, or paralysis or numbness of the face, arm, or leg.  Per Dr Darnell Level pt should be evaluated today, nothing available in the office advised to go to UC to have urinalysis to rule out that as the cause of increased confusion, Joshua Everett expressed understanding and will take pt to [facility] UC in [place] and call Neurology also for further advice

## 2020-01-16 ENCOUNTER — Other Ambulatory Visit: Payer: Self-pay

## 2020-01-16 ENCOUNTER — Ambulatory Visit (INDEPENDENT_AMBULATORY_CARE_PROVIDER_SITE_OTHER): Payer: Medicare Other

## 2020-01-16 VITALS — BP 138/70 | HR 76 | Wt 214.0 lb

## 2020-01-16 DIAGNOSIS — Z0000 Encounter for general adult medical examination without abnormal findings: Secondary | ICD-10-CM

## 2020-01-16 NOTE — Patient Instructions (Signed)
Joshua Everett , Thank you for taking time to come for your Medicare Wellness Visit. I appreciate your ongoing commitment to your health goals. Please review the following plan we discussed and let me know if I can assist you in the future.   Screening recommendations/referrals: Colonoscopy: no longer required Recommended yearly ophthalmology/optometry visit for glaucoma screening and checkup Recommended yearly dental visit for hygiene and checkup  Vaccinations: Influenza vaccine: due, will be available in the office at the end of August Pneumococcal vaccine: Completed series Tdap vaccine: Up to date, completed 07/21/2013, due 07/2023 Shingles vaccine: due, check with your insurance regarding coverage    Covid-19: Completed series  Advanced directives: Please bring a copy of your POA (Power of [place]) and/or Living Will to your next appointment.   Conditions/risks identified: hypertension, hyperlipidemia  Next appointment: Follow up in one year for your annual wellness visit.   Preventive Care 37 Years and Older, Male Preventive care refers to lifestyle choices and visits with your health care provider that can promote health and wellness. What does preventive care include?  A yearly physical exam. This is also called an annual well check.  Dental exams once or twice a year.  Routine eye exams. Ask your health care provider how often you should have your eyes checked.  Personal lifestyle choices, including:  Daily care of your teeth and gums.  Regular physical activity.  Eating a healthy diet.  Avoiding tobacco and drug use.  Limiting alcohol use.  Practicing safe sex.  Taking low doses of aspirin every day.  Taking vitamin and mineral supplements as recommended by your health care provider. What happens during an annual well check? The services and screenings done by your health care provider during your annual well check will depend on your age, overall health,  lifestyle risk factors, and family history of disease. Counseling  Your health care provider may ask you questions about your:  Alcohol use.  Tobacco use.  Drug use.  Emotional well-being.  Home and relationship well-being.  Sexual activity.  Eating habits.  History of falls.  Memory and ability to understand (cognition).  Work and work Statistician. Screening  You may have the following tests or measurements:  Height, weight, and BMI.  Blood pressure.  Lipid and cholesterol levels. These may be checked every 5 years, or more frequently if you are over 15 years old.  Skin check.  Lung cancer screening. You may have this screening every year starting at age 79 if you have a 30-pack-year history of smoking and currently smoke or have quit within the past 15 years.  Fecal occult blood test (FOBT) of the stool. You may have this test every year starting at age 66.  Flexible sigmoidoscopy or colonoscopy. You may have a sigmoidoscopy every 5 years or a colonoscopy every 10 years starting at age 49.  Prostate cancer screening. Recommendations will vary depending on your family history and other risks.  Hepatitis C blood test.  Hepatitis B blood test.  Sexually transmitted disease (STD) testing.  Diabetes screening. This is done by checking your blood sugar (glucose) after you have not eaten for a while (fasting). You may have this done every 1-3 years.  Abdominal aortic aneurysm (AAA) screening. You may need this if you are a current or former smoker.  Osteoporosis. You may be screened starting at age 75 if you are at high risk. Talk with your health care provider about your test results, treatment options, and if necessary, the need for  more tests. Vaccines  Your health care provider may recommend certain vaccines, such as:  Influenza vaccine. This is recommended every year.  Tetanus, diphtheria, and acellular pertussis (Tdap, Td) vaccine. You may need a Td booster  every 10 years.  Zoster vaccine. You may need this after age 46.  Pneumococcal 13-valent conjugate (PCV13) vaccine. One dose is recommended after age 2.  Pneumococcal polysaccharide (PPSV23) vaccine. One dose is recommended after age 62. Talk to your health care provider about which screenings and vaccines you need and how often you need them. This information is not intended to replace advice given to you by your health care provider. Make sure you discuss any questions you have with your health care provider. Document Released: 06/18/2015 Document Revised: 02/09/2016 Document Reviewed: 03/23/2015 Elsevier Interactive Patient Education  2017 [employer] Prevention in the Home Falls can cause injuries. They can happen to people of all ages. There are many things you can do to make your home safe and to help prevent falls. What can I do on the outside of my home?  Regularly fix the edges of walkways and driveways and fix any cracks.  Remove anything that might make you trip as you walk through a door, such as a raised step or threshold.  Trim any bushes or trees on the path to your home.  Use bright outdoor lighting.  Clear any walking paths of anything that might make someone trip, such as rocks or tools.  Regularly check to see if handrails are loose or broken. Make sure that both sides of any steps have handrails.  Any raised decks and porches should have guardrails on the edges.  Have any leaves, snow, or ice cleared regularly.  Use sand or salt on walking paths during winter.  Clean up any spills in your garage right away. This includes oil or grease spills. What can I do in the bathroom?  Use night lights.  Install grab bars by the toilet and in the tub and shower. Do not use towel bars as grab bars.  Use non-skid mats or decals in the tub or shower.  If you need to sit down in the shower, use a plastic, non-slip stool.  Keep the floor dry. Clean up any  water that spills on the floor as soon as it happens.  Remove soap buildup in the tub or shower regularly.  Attach bath mats securely with double-sided non-slip rug tape.  Do not have throw rugs and other things on the floor that can make you trip. What can I do in the bedroom?  Use night lights.  Make sure that you have a light by your bed that is easy to reach.  Do not use any sheets or blankets that are too big for your bed. They should not hang down onto the floor.  Have a firm chair that has side arms. You can use this for support while you get dressed.  Do not have throw rugs and other things on the floor that can make you trip. What can I do in the kitchen?  Clean up any spills right away.  Avoid walking on wet floors.  Keep items that you use a lot in easy-to-reach places.  If you need to reach something above you, use a strong step stool that has a grab bar.  Keep electrical cords out of the way.  Do not use floor polish or wax that makes floors slippery. If you must use wax, use non-skid  floor wax.  Do not have throw rugs and other things on the floor that can make you trip. What can I do with my stairs?  Do not leave any items on the stairs.  Make sure that there are handrails on both sides of the stairs and use them. Fix handrails that are broken or loose. Make sure that handrails are as long as the stairways.  Check any carpeting to make sure that it is firmly attached to the stairs. Fix any carpet that is loose or worn.  Avoid having throw rugs at the top or bottom of the stairs. If you do have throw rugs, attach them to the floor with carpet tape.  Make sure that you have a light switch at the top of the stairs and the bottom of the stairs. If you do not have them, ask someone to add them for you. What else can I do to help prevent falls?  Wear shoes that:  Do not have high heels.  Have rubber bottoms.  Are comfortable and fit you well.  Are closed  at the toe. Do not wear sandals.  If you use a stepladder:  Make sure that it is fully opened. Do not climb a closed stepladder.  Make sure that both sides of the stepladder are locked into place.  Ask someone to hold it for you, if possible.  Clearly mark and make sure that you can see:  Any grab bars or handrails.  First and last steps.  Where the edge of each step is.  Use tools that help you move around (mobility aids) if they are needed. These include:  Canes.  Walkers.  Scooters.  Crutches.  Turn on the lights when you go into a dark area. Replace any light bulbs as soon as they burn out.  Set up your furniture so you have a clear path. Avoid moving your furniture around.  If any of your floors are uneven, fix them.  If there are any pets around you, be aware of where they are.  Review your medicines with your doctor. Some medicines can make you feel dizzy. This can increase your chance of falling. Ask your doctor what other things that you can do to help prevent falls. This information is not intended to replace advice given to you by your health care provider. Make sure you discuss any questions you have with your health care provider. Document Released: 03/18/2009 Document Revised: 10/28/2015 Document Reviewed: 06/26/2014 Elsevier Interactive Patient Education  2017 [employer].

## 2020-01-16 NOTE — Progress Notes (Signed)
Subjective:   Joshua Everett is a 85 y.o. male who presents for Medicare Annual/Subsequent preventive examination.  Review of Systems: N/A     I connected with the patient today by telephone and verified that I am speaking with the correct person using two identifiers. Location patient: home Location nurse: work Persons participating in the virtual visit: patient, Marine scientist.   I discussed the limitations, risks, security and privacy concerns of performing an evaluation and management service by telephone and the availability of in person appointments. I also discussed with the patient that there may be a patient responsible charge related to this service. The patient expressed understanding and verbally consented to this telephonic visit.    Interactive audio and video telecommunications were attempted between this nurse and patient, however failed, due to patient having technical difficulties OR patient did not have access to video capability.  We continued and completed visit with audio only.     Cardiac Risk Factors include: advanced age (>53men, >2 women);male gender;hypertension;dyslipidemia     Objective:    Today's Vitals   01/16/20 0859 01/16/20 0924  BP: 138/70   Pulse: 76   Weight: 214 lb (97.1 kg)   PainSc:  0-No pain   Body mass index is 30.71 kg/m.  Advanced Directives 01/16/2020 01/13/2019 12/26/2017 12/20/2016 08/17/2016 08/17/2016 11/11/2015  Does Patient Have a Medical Advance Directive? Yes Yes Yes Yes Yes No Yes  Type of Paramedic of [place];Living will Living will;Healthcare Power of [place];Living will [place];Living will Living will;Healthcare Power of [place];Living will  Does patient want to make changes to medical advance directive? - - - - No - Patient declined - No - Patient declined  Copy of [place] in Chart? No - copy requested  - No - copy requested Yes No - copy requested - No - copy requested  Would patient like information on creating a medical advance directive? - - - - - No - Patient declined -    Current Medications (verified) Outpatient Encounter Medications as of 01/16/2020  Medication Sig  . B Complex Vitamins (VITAMIN B COMPLEX PO) Take 1 tablet by mouth daily.  . bimatoprost (LUMIGAN) 0.01 % SOLN Place 1 drop into both eyes at bedtime.   . cholecalciferol (VITAMIN D) 1000 units tablet Take 1,000 Units by mouth daily.   . cycloSPORINE (RESTASIS) 0.05 % ophthalmic emulsion Place 1 drop into both eyes 2 (two) times daily.  [name] Kitchen desmopressin (DDAVP) 0.2 MG tablet Take 0.6 mg by mouth at bedtime.   . fenofibrate 160 MG tablet Take 1 tablet (160 mg total) by mouth daily.  [name] Kitchen linaclotide (LINZESS) 290 MCG CAPS capsule Take 1 capsule (290 mcg total) by mouth daily before breakfast.  . lisinopril-hydrochlorothiazide (ZESTORETIC) 20-12.5 MG tablet Take 1 tablet by mouth daily.  . Multiple Vitamins-Minerals (MULTIVITAMIN ADULT PO) Take 1 tablet by mouth daily.  . NON FORMULARY Take 2 drops by mouth daily. TINCTURE OF GARLIC  . pantoprazole (PROTONIX) 40 MG tablet Take 1 tablet (40 mg total) by mouth daily.  . simvastatin (ZOCOR) 40 MG tablet TAKE 1 TABLET DAILY AT 6 P.M.  . solifenacin (VESICARE) 10 MG tablet   . carbidopa-levodopa (SINEMET IR) 25-100 MG tablet Take by mouth.   No facility-administered encounter medications on file as of 01/16/2020.    Allergies (verified) Patient has no known allergies.   History: Past Medical History:  Diagnosis Date  .  Basal cell carcinoma 09/03/2014   right of midline forehead  . Basal cell carcinoma 03/25/2019   right lower nasal dorsum, EDC  . Basal cell carcinoma (BCC) 01/28/2015   right proximal nasal alar rim  . BPH without obstruction/lower urinary tract symptoms 09/08/2014  . Chronic constipation 09/08/2014  . GERD (gastroesophageal reflux disease)   . Glaucoma   .  History of chicken pox   . Hyperlipidemia   . Obesity, Class I, BMI 30-34.9 09/08/2014  . Prediabetes 07/31/2015  . Rosacea 07/06/2015  . Squamous cell carcinoma of skin 01/30/2007   mid back, in situ  . Squamous cell carcinoma of skin 03/12/2018   right forearm, in situ  . Squamous cell carcinoma of skin 04/22/2018   left pretibial  . Squamous cell carcinoma of skin 09/24/2018   right hand dorsum, in situ  . Urine incontinence    Past Surgical History:  Procedure Laterality Date  . COLONOSCOPY  03/2009   1 TA, diverticulosis, rpt 5 yrs (Dr [facility] in [facility])  . ESOPHAGOGASTRODUODENOSCOPY  03/2009   small HH, gastric polyps biopsied, dilation for dysphagia ([facility])  . HEMORRHOID SURGERY  1976   Family History  Problem Relation Age of Onset  . Diabetes Mother   . Alzheimer's disease Mother 2  . Kidney disease Father   . Cancer Father        brain tumor  . Cancer Sister        breast   Social History   Socioeconomic History  . Marital status: Married    Spouse name: Not on file  . Number of children: Not on file  . Years of education: Not on file  . Highest education level: Not on file  Occupational History  . Not on file  Tobacco Use  . Smoking status: Former Smoker    Quit date: 06/05/1968    Years since quitting: 51.6  . Smokeless tobacco: Never Used  Substance and Sexual Activity  . Alcohol use: Not Currently    Alcohol/week: 0.0 standard drinks  . Drug use: No  . Sexual activity: Never  Other Topics Concern  . Not on file  Social History Narrative   Lives with wife   Occ: Nature conservation officer, retired   Edu: MBA [employer]   Activity: golfs regularly   Diet: good water, fruits/vegetables daily   Social Determinants of Radio broadcast assistant Strain: Low Risk   . Difficulty of Paying Living Expenses: Not hard at all  Food Insecurity: No Food Insecurity  . Worried About Charity fundraiser in the Last Year: Never true  . Ran Out of Food  in the Last Year: Never true  Transportation Needs: No Transportation Needs  . Lack of Transportation (Medical): No  . Lack of Transportation (Non-Medical): No  Physical Activity: Insufficiently Active  . Days of Exercise per Week: 3 days  . Minutes of Exercise per Session: 30 min  Stress: No Stress Concern Present  . Feeling of Stress : Not at all  Social Connections:   . Frequency of Communication with Friends and Family:   . Frequency of Social Gatherings with Friends and Family:   . Attends Religious Services:   . Active Member of Clubs or Organizations:   . Attends Archivist Meetings:   [name] Kitchen Marital Status:     Tobacco Counseling Counseling given: Not Answered   Clinical Intake:  Pre-visit preparation completed: Yes  Pain : No/denies pain Pain Score: 0-No pain  Nutritional Risks: None Diabetes: No  How often do you need to have someone help you when you read instructions, pamphlets, or other written materials from your doctor or pharmacy?: 1 - Never What is the last grade level you completed in school?: masters  Diabetic: No Nutrition Risk Assessment:  Has the patient had any N/V/D within the last 2 months?  No  Does the patient have any non-healing wounds?  No  Has the patient had any unintentional weight loss or weight gain?  No   Diabetes:  Is the patient diabetic?  No  If diabetic, was a CBG obtained today?  N/A Did the patient bring in their glucometer from home?  N/A How often do you monitor your CBG's? N/A.   Financial Strains and Diabetes Management:  Are you having any financial strains with the device, your supplies or your medication? N/A.  Does the patient want to be seen by Chronic Care Management for management of their diabetes?  N/A Would the patient like to be referred to a Nutritionist or for Diabetic Management?  N/A     Interpreter Needed?: No  Information entered by :: CJohnson, LPN   Activities of Daily Living In  your present state of health, do you have any difficulty performing the following activities: 01/16/2020  Hearing? N  Vision? N  Difficulty concentrating or making decisions? Y  Comment some memory loss noted  Walking or climbing stairs? N  Dressing or bathing? N  Doing errands, shopping? N  Preparing Food and eating ? N  Using the Toilet? N  In the past six months, have you accidently leaked urine? Y  Comment takes medication  Do you have problems with loss of bowel control? N  Managing your Medications? N  Managing your Finances? N  Housekeeping or managing your Housekeeping? N  Some recent data might be hidden    Patient Care Team: Ria Bush, MD as PCP - General (Family Medicine) Leandrew Koyanagi, MD as Referring Physician (Ophthalmology) Lucilla Lame, MD as Consulting Physician (Gastroenterology) Franchot Gallo, MD as Consulting Physician (Urology) Ralene Bathe, MD as Consulting Physician (Dermatology)  Indicate any recent Medical Services you may have received from other than [facility] providers in the past year (date may be approximate).     Assessment:   This is a routine wellness examination for Joshua Everett.  Hearing/Vision screen  Hearing Screening   125Hz  250Hz  500Hz  1000Hz  2000Hz  3000Hz  4000Hz  6000Hz  8000Hz   Right ear:           Left ear:           Vision Screening Comments: Patient gets annual eye exams  Dietary issues and exercise activities discussed: Current Exercise Habits: Home exercise routine, Type of exercise: Other - see comments (exercise bike), Time (Minutes): 30, Frequency (Times/Week): 3, Weekly Exercise (Minutes/Week): 90, Intensity: Moderate, Exercise limited by: None identified  Goals    .  Increase physical activity (pt-stated)      Starting 12/20/2016, I will continue to exercise for at least 15-20 min 3 times weekly.     .  Increase physical activity      Starting 12/26/2017, I will continue exercise for 15 minutes twice weekly.       .  Patient Stated      01/16/2020, I will continue to ride my exercise bike 3 days a week for 30 minutes.       Depression Screen PHQ 2/9 Scores 01/16/2020 01/13/2019 12/26/2017 12/20/2016 12/20/2015 11/11/2015 05/10/2015  PHQ - 2  Score 0 0 0 0 0 0 0  PHQ- 9 Score 0 - 0 - - - -    Fall Risk Fall Risk  01/16/2020 01/13/2019 12/26/2017 12/20/2016 12/20/2015  Falls in the past year? 0 0 No No No  Number falls in past yr: 0 - - - -  Injury with Fall? 0 - - - -  Risk for fall due to : Medication side effect - - - -  Follow up Falls evaluation completed;Falls prevention discussed - - - -    Any stairs in or around the home? Yes  If so, are there any without handrails? No  Home free of loose throw rugs in walkways, pet beds, electrical cords, etc? Yes  Adequate lighting in your home to reduce risk of falls? Yes   ASSISTIVE DEVICES UTILIZED TO PREVENT FALLS:  Life alert? No  Use of a cane, walker or w/c? No  Grab bars in the bathroom? No  Shower chair or bench in shower? No  Elevated toilet seat or a handicapped toilet? No   TIMED UP AND GO:  Was the test performed? N/A, telephonic visit .   Cognitive Function: MMSE - Mini Mental State Exam 01/16/2020 12/26/2017 12/20/2016 11/11/2015  Orientation to time 5 5 5 5   Orientation to Place 5 5 5 5   Registration 3 3 3 3   Attention/ Calculation 5 0 0 0  Recall 3 3 2 3   Language- name 2 objects - 0 0 0  Language- repeat 1 1 1 1   Language- follow 3 step command - 3 3 3   Language- read & follow direction - 0 0 0  Write a sentence - 0 0 0  Copy design - 0 0 0  Total score - 20 19 20   Mini Cog  Mini-Cog screen was completed. Maximum score is 22. A value of 0 denotes this part of the MMSE was not completed or the patient failed this part of the Mini-Cog screening.       Immunizations Immunization History  Administered Date(s) Administered  . Influenza-Unspecified 03/23/2014  . PFIZER SARS-COV-2 Vaccination 07/31/2019, 08/27/2019  .  Pneumococcal Conjugate-13 03/23/2014  . Pneumococcal Polysaccharide-23 06/06/2007  . Tdap 07/21/2013  . Zoster 05/04/2013    TDAP status: Up to date Flu Vaccine status: due, will be available at the end of August in the office  Pneumococcal vaccine status: Up to date Covid-19 vaccine status: Completed vaccines  Qualifies for Shingles Vaccine? Yes   Zostavax completed Yes   Shingrix Completed?: No.    Education has been provided regarding the importance of this vaccine. Patient has been advised to call insurance company to determine out of pocket expense if they have not yet received this vaccine. Advised may also receive vaccine at local pharmacy or Health Dept. Verbalized acceptance and understanding.  Screening Tests Health Maintenance  Topic Date Due  . DTAP VACCINES (1) 10/09/1934  . INFLUENZA VACCINE  01/04/2020  . DTaP/Tdap/Td (2 - Td or Tdap) 07/22/2023  . TETANUS/TDAP  07/22/2023  . COVID-19 Vaccine  Completed  . PNA vac Low Risk Adult  Completed    Health Maintenance  Health Maintenance Due  Topic Date Due  . DTAP VACCINES (1) 10/09/1934  . INFLUENZA VACCINE  01/04/2020    Colorectal cancer screening: No longer required.   Lung Cancer Screening: (Low Dose CT Chest recommended if Age 55-80 years, 30 pack-year currently smoking OR have quit w/in 15 years.) does not qualify.    Additional Screening:  Hepatitis  C Screening: does not qualify; Completed N/A  Vision Screening: Recommended annual ophthalmology exams for early detection of glaucoma and other disorders of the eye. Is the patient up to date with their annual eye exam?  Yes  Who is the provider or what is the name of the office in which the patient attends annual eye exams? [facility], Dr. Wallace Going  If pt is not established with a provider, would they like to be referred to a provider to establish care? No .   Dental Screening: Recommended annual dental exams for proper oral  hygiene  Community Resource Referral / Chronic Care Management: CRR required this visit?  No   CCM required this visit?  No      Plan:     I have personally reviewed and noted the following in the patient's chart:   . Medical and social history . Use of alcohol, tobacco or illicit drugs  . Current medications and supplements . Functional ability and status . Nutritional status . Physical activity . Advanced directives . List of other physicians . Hospitalizations, surgeries, and ER visits in previous 12 months . Vitals . Screenings to include cognitive, depression, and falls . Referrals and appointments  In addition, I have reviewed and discussed with patient certain preventive protocols, quality metrics, and best practice recommendations. A written personalized care plan for preventive services as well as general preventive health recommendations were provided to patient.   Due to this being a telephonic visit, the after visit summary with patients personalized plan was offered to patient via mail or my-chart. Patient preferred to pick up at office at next visit.   Andrez Grime, LPN   [id number]

## 2020-01-16 NOTE — Progress Notes (Signed)
PCP notes:  Health Maintenance: Flu- due   Abnormal Screenings: none   Patient concerns: Discuss prognosis of Parkinson's. Had a follow up visit with his neurologist recently.    Nurse concerns: none   Next PCP appt.: 01/27/2020 @ 3:30 pm

## 2020-01-19 ENCOUNTER — Encounter: Payer: Medicare Other | Admitting: Family Medicine

## 2020-01-19 ENCOUNTER — Other Ambulatory Visit: Payer: Self-pay | Admitting: Family Medicine

## 2020-01-22 ENCOUNTER — Other Ambulatory Visit: Payer: Self-pay | Admitting: Family Medicine

## 2020-01-22 DIAGNOSIS — R7303 Prediabetes: Secondary | ICD-10-CM

## 2020-01-22 DIAGNOSIS — E785 Hyperlipidemia, unspecified: Secondary | ICD-10-CM

## 2020-01-23 ENCOUNTER — Other Ambulatory Visit: Payer: Self-pay

## 2020-01-23 ENCOUNTER — Other Ambulatory Visit (INDEPENDENT_AMBULATORY_CARE_PROVIDER_SITE_OTHER): Payer: Medicare Other

## 2020-01-23 DIAGNOSIS — R7303 Prediabetes: Secondary | ICD-10-CM | POA: Diagnosis not present

## 2020-01-23 DIAGNOSIS — E785 Hyperlipidemia, unspecified: Secondary | ICD-10-CM

## 2020-01-23 LAB — LIPID PANEL
Cholesterol: 133 mg/dL (ref 0–200)
HDL: 34.3 mg/dL — ABNORMAL LOW (ref >39.00)
LDL Cholesterol: 72 mg/dL (ref 0–99)
NonHDL: 99.17
Total CHOL/HDL Ratio: 4
Triglycerides: 138 mg/dL (ref 0.0–149.0)
VLDL: 27.6 mg/dL (ref 0.0–40.0)

## 2020-01-23 LAB — HEMOGLOBIN A1C: Hgb A1c MFr Bld: 5.9 % (ref 4.6–6.5)

## 2020-01-23 LAB — COMPREHENSIVE METABOLIC PANEL
ALT: 13 U/L (ref 0–53)
AST: 26 U/L (ref 0–37)
Albumin: 3.8 g/dL (ref 3.5–5.2)
Alkaline Phosphatase: 37 U/L — ABNORMAL LOW (ref 39–117)
BUN: 12 mg/dL (ref 6–23)
CO2: 30 mEq/L (ref 19–32)
Calcium: 10.3 mg/dL (ref 8.4–10.5)
Chloride: 108 mEq/L (ref 96–112)
Creatinine, Ser: 1.11 mg/dL (ref 0.40–1.50)
GFR: 63.19 mL/min (ref >60.00)
Glucose, Bld: 103 mg/dL — ABNORMAL HIGH (ref 70–99)
Potassium: 4.4 mEq/L (ref 3.5–5.1)
Sodium: 143 mEq/L (ref 135–145)
Total Bilirubin: 0.7 mg/dL (ref 0.2–1.2)
Total Protein: 6.6 g/dL (ref 6.0–8.3)

## 2020-01-27 ENCOUNTER — Ambulatory Visit (INDEPENDENT_AMBULATORY_CARE_PROVIDER_SITE_OTHER): Payer: Medicare Other | Admitting: Family Medicine

## 2020-01-27 ENCOUNTER — Other Ambulatory Visit: Payer: Self-pay

## 2020-01-27 ENCOUNTER — Encounter: Payer: Self-pay | Admitting: Family Medicine

## 2020-01-27 VITALS — BP 124/66 | HR 66 | Temp 98.2°F | Ht 69.75 in | Wt 208.4 lb

## 2020-01-27 DIAGNOSIS — E669 Obesity, unspecified: Secondary | ICD-10-CM

## 2020-01-27 DIAGNOSIS — H409 Unspecified glaucoma: Secondary | ICD-10-CM

## 2020-01-27 DIAGNOSIS — E785 Hyperlipidemia, unspecified: Secondary | ICD-10-CM

## 2020-01-27 DIAGNOSIS — N400 Enlarged prostate without lower urinary tract symptoms: Secondary | ICD-10-CM

## 2020-01-27 DIAGNOSIS — Z7189 Other specified counseling: Secondary | ICD-10-CM

## 2020-01-27 DIAGNOSIS — I10 Essential (primary) hypertension: Secondary | ICD-10-CM

## 2020-01-27 DIAGNOSIS — N3281 Overactive bladder: Secondary | ICD-10-CM

## 2020-01-27 DIAGNOSIS — K219 Gastro-esophageal reflux disease without esophagitis: Secondary | ICD-10-CM

## 2020-01-27 DIAGNOSIS — R7303 Prediabetes: Secondary | ICD-10-CM

## 2020-01-27 DIAGNOSIS — K5909 Other constipation: Secondary | ICD-10-CM

## 2020-01-27 DIAGNOSIS — G20 Parkinson's disease: Secondary | ICD-10-CM

## 2020-01-27 MED ORDER — FENOFIBRATE 160 MG PO TABS: 160.0000 mg | ORAL_TABLET | Freq: Every day | ORAL | 3 refills | Status: DC

## 2020-01-27 MED ORDER — SIMVASTATIN 40 MG PO TABS
ORAL_TABLET | ORAL | 3 refills | Status: DC
Start: 2020-01-27 — End: 2021-01-27

## 2020-01-27 MED ORDER — LISINOPRIL-HYDROCHLOROTHIAZIDE 20-12.5 MG PO TABS: 1.0000 | ORAL_TABLET | Freq: Every day | ORAL | 3 refills | Status: DC

## 2020-01-27 MED ORDER — DOCUSATE SODIUM 100 MG PO CAPS: 100.0000 mg | ORAL_CAPSULE | Freq: Every day | ORAL | Status: DC

## 2020-01-27 NOTE — Assessment & Plan Note (Signed)
Seeing glaucoma specialist.

## 2020-01-27 NOTE — Assessment & Plan Note (Signed)
Continues PPI daily.

## 2020-01-27 NOTE — Assessment & Plan Note (Addendum)
Ongoing weight loss noted - labs stable. Continue to monitor.

## 2020-01-27 NOTE — Assessment & Plan Note (Signed)
Followed by urology.  Now only on desmopressin.  will await latest uro note for latest recs.

## 2020-01-27 NOTE — Assessment & Plan Note (Signed)
Only on desmopressin - continue. Await latest uro note.

## 2020-01-27 NOTE — Progress Notes (Signed)
This visit was conducted in person.  BP 124/66 (BP Location: Left Arm, Patient Position: Sitting, Cuff Size: Large)   Pulse 66   Temp 98.2 F (36.8 C) (Temporal)   Ht 5' 9.75" (1.772 m)   Wt 208 lb 6 oz (94.5 kg)   SpO2 95%   BMI 30.11 kg/m    CC: AMW f/u visit  Subjective:    Patient ID: Joshua Everett, male    DOB: 1934/12/28, 85 y.o.   MRN: [id number]  HPI: Joshua Everett is a 85 y.o. male presenting on 01/27/2020 for Annual Exam (Prt 2. )   Saw health advisor 2 wks ago for medicare wellness visit. Note reviewed.    No exam data present    Clinical Support from 01/16/2020 in  at [facility] Total Score 0      Fall Risk  01/16/2020 01/13/2019 12/26/2017 12/20/2016 12/20/2015  Falls in the past year? 0 0 No No No  Number falls in past yr: 0 - - - -  Injury with Fall? 0 - - - -  Risk for fall due to : Medication side effect - - - -  Follow up Falls evaluation completed;Falls prevention discussed - - - -   PD - followed by [facility] Dr Manuella Ghazi on sinemet.   Overactive bladder has seen uro now off myrbetriq and flomax only on desmopressin and generic vesicare. Vesicare recently stopped due to concern over confusion and hallucinations - with likely improvement noted. uro pending message to me about diuretic dose. He is regularly using compression stockings.   Preventative: COLONOSCOPY Date: 2010TAx1, rpt 5 yrs([facility] [place])complicated by marked bradycardia s/p ER evaluation.iFOB negative 01/2017. Prostate cancer screening - aged out Declines flu shot. prevnar 03/2014, pneumovax 2009. Tdap 2015 COVID vaccine - Pfizer 07/2019, 08/2019  zostavax 04/2013 shingrix - discussed - to check with pharmacy.  Advanced directives - scanned 01/2016. No life prolonging measures if terminal irreversible condition. Separate HCPOA form filled out - [name] wife then Kimball Appleby are [place]. Scanned 02/2017. Seat belt use discussed.  Sunscreen use  discussed. Sees dermatology (Dr Nicole Kindred)  Ex smoker quit 1970  Alcohol - seldom  Dentist - Q6 mo  Eye exam - yearly (glaucoma)  Bowel - constipation well managed on linzess  Bladder - ongoing incontinence (OAB)   Lives with wife Occ: Nature conservation officer, retired Edu: MBA [employer] Activity:stopped golfing 6 months ago. Trouble with balance, was no longer fun. Diet: good water, fruits/vegetables daily.     Relevant past medical, surgical, family and social history reviewed and updated as indicated. Interim medical history since our last visit reviewed. Allergies and medications reviewed and updated. Outpatient Medications Prior to Visit  Medication Sig Dispense Refill  . B Complex Vitamins (VITAMIN B COMPLEX PO) Take 1 tablet by mouth daily.    . bimatoprost (LUMIGAN) 0.01 % SOLN Place 1 drop into both eyes at bedtime.     . carbidopa-levodopa (SINEMET IR) 25-100 MG tablet Take 1 tablet at 8am, 2 tablets at 12pm, 1 tablet at 4pm    . cholecalciferol (VITAMIN D) 1000 units tablet Take 1,000 Units by mouth daily.     . cycloSPORINE (RESTASIS) 0.05 % ophthalmic emulsion Place 1 drop into both eyes 2 (two) times daily.    [name] Kitchen desmopressin (DDAVP) 0.2 MG tablet Take 0.6 mg by mouth at bedtime.     [name] Kitchen linaclotide (LINZESS) 290 MCG CAPS capsule Take 1 capsule (290 mcg total) by mouth daily  before breakfast. 90 capsule 3  . Multiple Vitamins-Minerals (MULTIVITAMIN ADULT PO) Take 1 tablet by mouth daily.    . NON FORMULARY Take 2 drops by mouth daily. TINCTURE OF GARLIC    . pantoprazole (PROTONIX) 40 MG tablet Take 1 tablet (40 mg total) by mouth daily. 90 tablet 3  . carbidopa-levodopa (SINEMET IR) 25-100 MG tablet Take by mouth.    . fenofibrate 160 MG tablet Take 1 tablet (160 mg total) by mouth daily. 90 tablet 3  . lisinopril-hydrochlorothiazide (ZESTORETIC) 20-12.5 MG tablet Take 1 tablet by mouth daily. 90 tablet 2  . simvastatin (ZOCOR) 40 MG tablet TAKE 1 TABLET DAILY AT 6 P.M. 90 tablet 3  .  carbidopa-levodopa (SINEMET CR) 50-200 MG tablet Take 1 tablet by mouth at bedtime.    . solifenacin (VESICARE) 10 MG tablet  (Patient not taking: Reported on 01/27/2020)     No facility-administered medications prior to visit.     Per HPI unless specifically indicated in ROS section below Review of Systems Objective:  BP 124/66 (BP Location: Left Arm, Patient Position: Sitting, Cuff Size: Large)   Pulse 66   Temp 98.2 F (36.8 C) (Temporal)   Ht 5' 9.75" (1.772 m)   Wt 208 lb 6 oz (94.5 kg)   SpO2 95%   BMI 30.11 kg/m   Wt Readings from Last 3 Encounters:  01/27/20 208 lb 6 oz (94.5 kg)  01/16/20 214 lb (97.1 kg)  12/01/19 220 lb 9 oz (100 kg)      Physical Exam Vitals and nursing note reviewed.  Constitutional:      General: He is not in acute distress.    Appearance: Normal appearance. He is well-developed. He is not ill-appearing.  HENT:     Head: Normocephalic and atraumatic.     Right Ear: Hearing, tympanic membrane, ear canal and external ear normal.     Left Ear: Hearing, tympanic membrane, ear canal and external ear normal.  Eyes:     General: No scleral icterus.    Extraocular Movements: Extraocular movements intact.     Conjunctiva/sclera: Conjunctivae normal.     Pupils: Pupils are equal, round, and reactive to light.  Neck:     Thyroid: No thyroid mass, thyromegaly or thyroid tenderness.  Cardiovascular:     Rate and Rhythm: Normal rate and regular rhythm.     Pulses: Normal pulses.          Radial pulses are 2+ on the right side and 2+ on the left side.     Heart sounds: Normal heart sounds. No murmur heard.   Pulmonary:     Effort: Pulmonary effort is normal. No respiratory distress.     Breath sounds: Normal breath sounds. No wheezing, rhonchi or rales.  Abdominal:     General: Abdomen is flat. Bowel sounds are normal. There is no distension.     Palpations: Abdomen is soft. There is no mass.     Tenderness: There is no abdominal tenderness. There is  no guarding or rebound.     Hernia: No hernia is present.  Musculoskeletal:        General: Normal range of motion.     Cervical back: Normal range of motion and neck supple.     Right lower leg: No edema.     Left lower leg: No edema.  Lymphadenopathy:     Cervical: No cervical adenopathy.  Skin:    General: Skin is warm and dry.     Findings:  No rash.  Neurological:     General: No focal deficit present.     Mental Status: He is alert and oriented to person, place, and time.     Comments: CN grossly intact, station and gait intact  Psychiatric:        Mood and Affect: Mood normal.        Behavior: Behavior normal.        Thought Content: Thought content normal.        Judgment: Judgment normal.       Results for orders placed or performed in visit on 01/23/20  Hemoglobin A1c  Result Value Ref Range   Hgb A1c MFr Bld 5.9 4.6 - 6.5 %  Comprehensive metabolic panel  Result Value Ref Range   Sodium 143 135 - 145 mEq/L   Potassium 4.4 3.5 - 5.1 mEq/L   Chloride 108 96 - 112 mEq/L   CO2 30 19 - 32 mEq/L   Glucose, Bld 103 (H) 70 - 99 mg/dL   BUN 12 6 - 23 mg/dL   Creatinine, Ser 1.11 0.40 - 1.50 mg/dL   Total Bilirubin 0.7 0.2 - 1.2 mg/dL   Alkaline Phosphatase 37 (L) 39 - 117 U/L   AST 26 0 - 37 U/L   ALT 13 0 - 53 U/L   Total Protein 6.6 6.0 - 8.3 g/dL   Albumin 3.8 3.5 - 5.2 g/dL   GFR 63.19 >60.00 mL/min   Calcium 10.3 8.4 - 10.5 mg/dL  Lipid panel  Result Value Ref Range   Cholesterol 133 0 - 200 mg/dL   Triglycerides 138.0 0 - 149 mg/dL   HDL 34.30 (L) >39.00 mg/dL   VLDL 27.6 0.0 - 40.0 mg/dL   LDL Cholesterol 72 0 - 99 mg/dL   Total CHOL/HDL Ratio 4    NonHDL 99.17    Assessment & Plan:  This visit occurred during the SARS-CoV-2 public health emergency.  Safety protocols were in place, including screening questions prior to the visit, additional usage of staff PPE, and extensive cleaning of exam room while observing appropriate contact time as indicated for  disinfecting solutions.   Problem List Items Addressed This Visit    Prediabetes    Stable period.       Parkinson's disease ([place])    Stable period on sinemet - appreciate neuro care.       Relevant Medications   carbidopa-levodopa (SINEMET CR) 50-200 MG tablet   carbidopa-levodopa (SINEMET IR) 25-100 MG tablet   Overactive bladder    Only on desmopressin - continue. Await latest uro note.       Obesity, Class I, BMI 30-34.9    Ongoing weight loss noted - labs stable. Continue to monitor.       Glaucoma    Seeing glaucoma specialist.      GERD (gastroesophageal reflux disease)    Continues PPI daily.       Relevant Medications   docusate sodium (COLACE) 100 MG capsule   Essential hypertension    Chronic, stable. Continue current regimen.  Cr slightly elevated but adequate.       Relevant Medications   fenofibrate 160 MG tablet   lisinopril-hydrochlorothiazide (ZESTORETIC) 20-12.5 MG tablet   simvastatin (ZOCOR) 40 MG tablet   Dyslipidemia    Chronic, stable on statin and fibrate - continue. The ASCVD Risk score Mikey Bussing DC Jr., et al., 2013) failed to calculate for the following reasons:   The 2013 ASCVD risk score is only valid for ages  40 to 79       Relevant Medications   fenofibrate 160 MG tablet   simvastatin (ZOCOR) 40 MG tablet   Chronic constipation    Stable period on linzess.       Relevant Medications   docusate sodium (COLACE) 100 MG capsule   BPH without obstruction/lower urinary tract symptoms    Followed by urology.  Now only on desmopressin.  will await latest uro note for latest recs.       Advanced care planning/counseling discussion    Advanced directives - scanned 01/2016. No life prolonging measures if terminal irreversible condition. Separate HCPOA form filled out - [name] wife then Namish Krise are [place]. Scanned 02/2017          Meds ordered this encounter  Medications  . fenofibrate 160 MG tablet    Sig: Take 1 tablet (160  mg total) by mouth daily.    Dispense:  90 tablet    Refill:  3  . lisinopril-hydrochlorothiazide (ZESTORETIC) 20-12.5 MG tablet    Sig: Take 1 tablet by mouth daily.    Dispense:  90 tablet    Refill:  3  . simvastatin (ZOCOR) 40 MG tablet    Sig: TAKE 1 TABLET DAILY AT 6 P.M.    Dispense:  90 tablet    Refill:  3  . docusate sodium (COLACE) 100 MG capsule    Sig: Take 1 capsule (100 mg total) by mouth daily.   No orders of the defined types were placed in this encounter.   Patient instructions: If interested, check with pharmacy about new 2 shot shingles series (shingrix).  Double check at home on health care power of attorney form. You are doing well today Return as needed or in 1 year for next wellness visit.   Follow up plan: Return in about 1 year (around 01/26/2021) for medicare wellness visit.  Ria Bush, MD

## 2020-01-27 NOTE — Assessment & Plan Note (Signed)
Stable period on linzess.

## 2020-01-27 NOTE — Assessment & Plan Note (Signed)
Stable period on sinemet - appreciate neuro care.

## 2020-01-27 NOTE — Assessment & Plan Note (Addendum)
Chronic, stable. Continue current regimen.  Cr slightly elevated but adequate.

## 2020-01-27 NOTE — Assessment & Plan Note (Signed)
Stable period.  

## 2020-01-27 NOTE — Assessment & Plan Note (Signed)
Chronic, stable on statin and fibrate - continue. The ASCVD Risk score (Goff DC Jr., et al., 2013) failed to calculate for the following reasons:   The 2013 ASCVD risk score is only valid for ages 40 to 79  

## 2020-01-27 NOTE — Patient Instructions (Addendum)
If interested, check with pharmacy about new 2 shot shingles series (shingrix).  Double check at home on health care power of attorney form. You are doing well today Return as needed or in 1 year for next wellness visit.   Health Maintenance After Age 83 After age 20, you are at a higher risk for certain long-term diseases and infections as well as injuries from falls. Falls are a major cause of broken bones and head injuries in people who are older than age 63. Getting regular preventive care can help to keep you healthy and well. Preventive care includes getting regular testing and making lifestyle changes as recommended by your health care provider. Talk with your health care provider about:  Which screenings and tests you should have. A screening is a test that checks for a disease when you have no symptoms.  A diet and exercise plan that is right for you. What should I know about screenings and tests to prevent falls? Screening and testing are the best ways to find a health problem early. Early diagnosis and treatment give you the best chance of managing medical conditions that are common after age 13. Certain conditions and lifestyle choices may make you more likely to have a fall. Your health care provider may recommend:  Regular vision checks. Poor vision and conditions such as cataracts can make you more likely to have a fall. If you wear glasses, make sure to get your prescription updated if your vision changes.  Medicine review. Work with your health care provider to regularly review all of the medicines you are taking, including over-the-counter medicines. Ask your health care provider about any side effects that may make you more likely to have a fall. Tell your health care provider if any medicines that you take make you feel dizzy or sleepy.  Osteoporosis screening. Osteoporosis is a condition that causes the bones to get weaker. This can make the bones weak and cause them to break  more easily.  Blood pressure screening. Blood pressure changes and medicines to control blood pressure can make you feel dizzy.  Strength and balance checks. Your health care provider may recommend certain tests to check your strength and balance while standing, walking, or changing positions.  Foot health exam. Foot pain and numbness, as well as not wearing proper footwear, can make you more likely to have a fall.  Depression screening. You may be more likely to have a fall if you have a fear of falling, feel emotionally low, or feel unable to do activities that you used to do.  Alcohol use screening. Using too much alcohol can affect your balance and may make you more likely to have a fall. What actions can I take to lower my risk of falls? General instructions  Talk with your health care provider about your risks for falling. Tell your health care provider if: ? You fall. Be sure to tell your health care provider about all falls, even ones that seem minor. ? You feel dizzy, sleepy, or off-balance.  Take over-the-counter and prescription medicines only as told by your health care provider. These include any supplements.  Eat a healthy diet and maintain a healthy weight. A healthy diet includes low-fat dairy products, low-fat (lean) meats, and fiber from whole grains, beans, and lots of fruits and vegetables. Home safety  Remove any tripping hazards, such as rugs, cords, and clutter.  Install safety equipment such as grab bars in bathrooms and safety rails on stairs.  Keep  rooms and walkways well-lit. Activity   Follow a regular exercise program to stay fit. This will help you maintain your balance. Ask your health care provider what types of exercise are appropriate for you.  If you need a cane or walker, use it as recommended by your health care provider.  Wear supportive shoes that have nonskid soles. Lifestyle  Do not drink alcohol if your health care provider tells you not  to drink.  If you drink alcohol, limit how much you have: ? 0-1 drink a day for women. ? 0-2 drinks a day for men.  Be aware of how much alcohol is in your drink. In the [place] one drink equals one typical bottle of beer (12 oz), one-half glass of wine (5 oz), or one shot of hard liquor (1 oz).  Do not use any products that contain nicotine or tobacco, such as cigarettes and e-cigarettes. If you need help quitting, ask your health care provider. Summary  Having a healthy lifestyle and getting preventive care can help to protect your health and wellness after age 73.  Screening and testing are the best way to find a health problem early and help you avoid having a fall. Early diagnosis and treatment give you the best chance for managing medical conditions that are more common for people who are older than age 97.  Falls are a major cause of broken bones and head injuries in people who are older than age 71. Take precautions to prevent a fall at home.  Work with your health care provider to learn what changes you can make to improve your health and wellness and to prevent falls. This information is not intended to replace advice given to you by your health care provider. Make sure you discuss any questions you have with your health care provider. Document Revised: 09/12/2018 Document Reviewed: 04/04/2017 Elsevier Patient Education  2020 [employer].

## 2020-01-27 NOTE — Assessment & Plan Note (Addendum)
Advanced directives - scanned 01/2016. No life prolonging measures if terminal irreversible condition. Separate HCPOA form filled out - [name] wife then Chapin Arduini are [place]. Scanned 02/2017

## 2020-01-29 ENCOUNTER — Telehealth: Payer: Self-pay | Admitting: Family Medicine

## 2020-01-29 DIAGNOSIS — N3281 Overactive bladder: Secondary | ICD-10-CM

## 2020-01-29 NOTE — Telephone Encounter (Signed)
Plz call - 1. I actully found copy of his health care POA in our chart - it was scanned 2018. We don't need another copy.  2. I received note from Dr Zannie Cove - he had suggested increasing fluid pill because of noted leg swelling - but when I saw him I didn't think he had significant leg swelling - would have [place] just watch this for now, let us know if worsening.

## 2020-01-29 NOTE — Telephone Encounter (Signed)
Spoke with pt relaying Dr. G's message. Pt verbalizes understanding.  

## 2020-03-30 ENCOUNTER — Other Ambulatory Visit: Payer: Self-pay

## 2020-03-30 ENCOUNTER — Ambulatory Visit (INDEPENDENT_AMBULATORY_CARE_PROVIDER_SITE_OTHER): Payer: Medicare Other | Admitting: Dermatology

## 2020-03-30 DIAGNOSIS — L578 Other skin changes due to chronic exposure to nonionizing radiation: Secondary | ICD-10-CM | POA: Diagnosis not present

## 2020-03-30 DIAGNOSIS — C44311 Basal cell carcinoma of skin of nose: Secondary | ICD-10-CM

## 2020-03-30 DIAGNOSIS — Z86007 Personal history of in-situ neoplasm of skin: Secondary | ICD-10-CM

## 2020-03-30 DIAGNOSIS — Z85828 Personal history of other malignant neoplasm of skin: Secondary | ICD-10-CM | POA: Diagnosis not present

## 2020-03-30 DIAGNOSIS — D485 Neoplasm of uncertain behavior of skin: Secondary | ICD-10-CM

## 2020-03-30 NOTE — Patient Instructions (Signed)
Wound Care Instructions  1. Cleanse wound gently with soap and water once a day then pat dry with clean gauze. Apply a thing coat of Mupirocin (Bactroban) over the wound (unless you have an allergy to this). We recommend that you use a new, sterile tube of Mupirocin. Do not pick or remove scabs. Do not remove the yellow or white "healing tissue" from the base of the wound.  2. Cover the wound with fresh, clean, nonstick gauze and secure with paper tape. You may use Band-Aids in place of gauze and tape if the would is small enough, but would recommend trimming much of the tape off as there is often too much. Sometimes Band-Aids can irritate the skin.  3. You should call the office for your biopsy report after 1 week if you have not already been contacted.  4. If you experience any problems, such as abnormal amounts of bleeding, swelling, significant bruising, significant pain, or evidence of infection, please call the office immediately.  5. FOR ADULT SURGERY PATIENTS: If you need something for pain relief you may take 1 extra strength Tylenol (acetaminophen) AND 2 Ibuprofen (200mg  each) together every 4 hours as needed for pain. (do not take these if you are allergic to them or if you have a reason you should not take them.) Typically, you may only need pain medication for 1 to 3 days.

## 2020-03-30 NOTE — Progress Notes (Signed)
   Follow-Up Visit   Subjective  Joshua Everett is a 85 y.o. male who presents for the following: Spot Check (Spot on nose that has been bleeding x approx 3 weeks. Pt has hx of BCC on nose.).    The following portions of the chart were reviewed this encounter and updated as appropriate:     Review of Systems: No other skin or systemic complaints except as noted in HPI or Assessment and Plan.   Objective  Well appearing patient in no apparent distress; mood and affect are within normal limits.  A focused examination was performed including face. Relevant physical exam findings are noted in the Assessment and Plan.  Objective  Mid nasal tip: 4 mm heme crusted macule     Assessment & Plan  Neoplasm of uncertain behavior of skin Mid nasal tip  Epidermal / dermal shaving  Lesion diameter (cm):  0.4 Informed consent: discussed and consent obtained   Patient was prepped and draped in usual sterile fashion: Area prepped with alcohol. Anesthesia: the lesion was anesthetized in a standard fashion   Anesthetic:  1% lidocaine w/ epinephrine 1-100,000 buffered w/ 8.4% NaHCO3 Instrument used: flexible razor blade   Hemostasis achieved with: pressure, aluminum chloride and electrodesiccation   Outcome: patient tolerated procedure well    Destruction of lesion  Destruction method: electrodesiccation and curettage   Timeout:  patient name, date of birth, surgical site, and procedure verified Curettage performed in three different directions: Yes   Electrodesiccation performed over the curetted area: Yes   Lesion length (cm):  0.4 Lesion width (cm):  0.4 Margin per side (cm):  0.1 Final wound size (cm):  0.6 Final wound size (cm) comment:  Biopsy site was very deep (~3 mm) upon curettage, discussed resulting depressed scar Hemostasis achieved with:  pressure, aluminum chloride and electrodesiccation Outcome: patient tolerated procedure well with no complications     Post-procedure details: wound care instructions given   Additional details:  Mupirocin ointment and Bandaid applied    Specimen 1 - Surgical pathology Differential Diagnosis: rule out BCC Check Margins: No 4 mm hem crusted macule  Discussed possible MOHs referral depending on results.   History of Basal Cell Carcinoma of the Skin - No evidence of recurrence today R lower nasal dorsum EDC 10/20 - Recommend regular full body skin exams - Recommend daily broad spectrum sunscreen SPF 30+ to sun-exposed areas, reapply every 2 hours as needed.  - Call if any new or changing lesions are noted between office visits  History of Squamous Cell Carcinoma in Situ of the Skin - No evidence of recurrence today R hand dorsum EDC 4/20 - Recommend regular full body skin exams - Recommend daily broad spectrum sunscreen SPF 30+ to sun-exposed areas, reapply every 2 hours as needed.  - Call if any new or changing lesions are noted between office visits  Actinic Damage - diffuse scaly erythematous macules with underlying dyspigmentation - Recommend daily broad spectrum sunscreen SPF 30+ to sun-exposed areas, reapply every 2 hours as needed.  - Call for new or changing lesions.  Return in 27 days (on 04/26/2020) for as scheduled.   I, Harriett Sine, CMA, am acting as scribe for Brendolyn Patty, MD.  Documentation: I have reviewed the above documentation for accuracy and completeness, and I agree with the above.  Brendolyn Patty MD

## 2020-04-05 ENCOUNTER — Telehealth: Payer: Self-pay

## 2020-04-05 NOTE — Telephone Encounter (Signed)
Discussed pathology results with pt's wife.  Advised that scar may leave an indention, and if they wanted to have MOHs we could schedule pt for [facility] and that would leave a line scar.  Pt's wife said that at this time they will let area heal in and let Dr. Nicole Kindred re evaluate on 04/26/20 f/u.  At that time if they feel they want MOHs we could schedule.  Did advise pt's wife that if any recurrence we would need to schedule for MOH's./sh

## 2020-04-05 NOTE — Telephone Encounter (Signed)
-----   Message from Brendolyn Patty, MD sent at 04/05/2020  8:33 AM EDT ----- Skin , mid nasal tip BASAL CELL CARCINOMA, NODULAR PATTERN  BCC- was treated with EDC at time of biopsy, but was deeper than expected.  Pt can let area heal by secondary intention and it will likely leave a depressed scar, but it will heal in.  If he wanted to do Mohs, that is also an option.  There is generally a couple months wait to have the procedure done, so he would get a good idea how the area has healed up before he goes in.  Either option would be fine.  If he decides to let it heal in by itself, then if there is any recurrence, then he would need Mohs.

## 2020-04-12 ENCOUNTER — Ambulatory Visit: Payer: Medicare Other | Attending: Critical Care Medicine

## 2020-04-12 DIAGNOSIS — Z23 Encounter for immunization: Secondary | ICD-10-CM

## 2020-04-12 NOTE — Progress Notes (Signed)
   Covid-19 Vaccination Clinic  Name:  Joshua Everett    MRN: [id number] DOB: 03/05/1935  04/12/2020  Mr. Joshua Everett was observed post Covid-19 immunization for 15 minutes without incident. He was provided with Vaccine Information Sheet and instruction to access the V-Safe system.   Mr. Joshua Everett was instructed to call 911 with any severe reactions post vaccine: [name] Kitchen Difficulty breathing  . Swelling of face and throat  . A fast heartbeat  . A bad rash all over body  . Dizziness and weakness

## 2020-04-19 DIAGNOSIS — N401 Enlarged prostate with lower urinary tract symptoms: Secondary | ICD-10-CM | POA: Diagnosis not present

## 2020-04-20 ENCOUNTER — Encounter: Payer: Self-pay | Admitting: Family Medicine

## 2020-04-20 LAB — BASIC METABOLIC PANEL: Creatinine: 0.9 (ref 0.6–1.3)

## 2020-04-21 ENCOUNTER — Encounter: Payer: Self-pay | Admitting: Family Medicine

## 2020-04-22 ENCOUNTER — Telehealth: Payer: Self-pay

## 2020-04-22 NOTE — Telephone Encounter (Signed)
Spoke with pt's wife, [name] (on dpr).  She states pt has not increased salt/sodium that she knows of.  Nor is he taking any decongestant or new supplements.  I relayed Dr. Synthia Innocent message.  She verbalizes understanding and will update Dr. Lenon Curt to Dr. Darnell Level.

## 2020-04-22 NOTE — Telephone Encounter (Signed)
I will defer to PCP.  Thanks.

## 2020-04-22 NOTE — Telephone Encounter (Signed)
Pt's wife left a message on triage line stating his BP was up at Dr Dahlstadt's office yesterday.   I called and she said his BP this morning was 170/84. He denies having headache, dizziness, or any other high BP issues. Takes is lisinopril-hctz every morning.   I will send this note to Dr Danise Mina and Dr Damita Dunnings since Dr Darnell Level is out of the office today.   Please advise.

## 2020-04-22 NOTE — Telephone Encounter (Signed)
Any increased sodium/salt in diet, any new decongestant use or new supplements?  rec increase water, potassium rich diet. Would encourage regular walking routine.  rec monitor blood pressures regularly at home over the next 3-5 days and write down readings. If persistently staying SBP >150 will titrate meds likely increase hctz dose.

## 2020-04-26 ENCOUNTER — Ambulatory Visit (INDEPENDENT_AMBULATORY_CARE_PROVIDER_SITE_OTHER): Payer: Medicare Other | Admitting: Dermatology

## 2020-04-26 ENCOUNTER — Telehealth: Payer: Self-pay | Admitting: *Deleted

## 2020-04-26 ENCOUNTER — Other Ambulatory Visit: Payer: Self-pay

## 2020-04-26 ENCOUNTER — Encounter: Payer: Self-pay | Admitting: Dermatology

## 2020-04-26 DIAGNOSIS — L820 Inflamed seborrheic keratosis: Secondary | ICD-10-CM

## 2020-04-26 DIAGNOSIS — Z85828 Personal history of other malignant neoplasm of skin: Secondary | ICD-10-CM

## 2020-04-26 DIAGNOSIS — L570 Actinic keratosis: Secondary | ICD-10-CM | POA: Diagnosis not present

## 2020-04-26 DIAGNOSIS — L578 Other skin changes due to chronic exposure to nonionizing radiation: Secondary | ICD-10-CM

## 2020-04-26 DIAGNOSIS — Z1283 Encounter for screening for malignant neoplasm of skin: Secondary | ICD-10-CM

## 2020-04-26 DIAGNOSIS — D1800 Hemangioma unspecified site: Secondary | ICD-10-CM

## 2020-04-26 DIAGNOSIS — L821 Other seborrheic keratosis: Secondary | ICD-10-CM

## 2020-04-26 NOTE — Telephone Encounter (Signed)
Patient's wife left a voicemail stating that they were to get some blood pressure readings for Dr. Danise Mina. Patient's wife stated that his blood pressure today was 151/84 and 150/73 which is the lowest numbers that they have gotten. Patient's wife stated his blood pressure yesterday was 172/84 and 150/73 and Saturday 166/91.

## 2020-04-26 NOTE — Progress Notes (Addendum)
Follow-Up Visit   Subjective  Joshua Everett is a 85 y.o. male who presents for the following: Follow-up (UBSE). He has a history of BCCs and SCCs. Recheck BCC site of the mid nasal dorsum that was treated with [facility] 10/21/19.  Check raised scaly growth on back- gets caught on clothes.  The following portions of the chart were reviewed this encounter and updated as appropriate:      Review of Systems:  No other skin or systemic complaints except as noted in HPI or Assessment and Plan.  Objective  Well appearing patient in no apparent distress; mood and affect are within normal limits.  All skin waist up examined.  Objective  Mid Nasal Tip: Well-healed EDC site with depressed scar  Objective  Right Spinal Upper Back x 2 (2): Erythematous keratotic or waxy stuck-on papule  Objective  R vertex x 2, L lower ear helix x 1, L hand dorsum x 3 (6): Erythematous thin papules/macules with gritty scale.    Assessment & Plan  History of basal cell carcinoma (BCC) Mid Nasal Tip  Clear. Observe for recurrence. Call clinic for new or changing lesions.  Recommend regular skin exams, daily broad-spectrum spf 30+ sunscreen use, and photoprotection.    If recurs, pt would need Mohs   Inflamed seborrheic keratosis (2) Right Spinal Upper Back x 2  Destruction of lesion - Right Spinal Upper Back x 2  Destruction method: cryotherapy   Informed consent: discussed and consent obtained   Lesion destroyed using liquid nitrogen: Yes   Region frozen until ice ball extended beyond lesion: Yes   Outcome: patient tolerated procedure well with no complications   Post-procedure details: wound care instructions given    AK (actinic keratosis) (6) R vertex x 2, L lower ear helix x 1, L hand dorsum x 3  Destruction of lesion - R vertex x 2, L lower ear helix x 1, L hand dorsum x 3  Destruction method: cryotherapy   Informed consent: discussed and consent obtained   Lesion destroyed using  liquid nitrogen: Yes   Region frozen until ice ball extended beyond lesion: Yes   Outcome: patient tolerated procedure well with no complications   Post-procedure details: wound care instructions given    Actinic Damage - chronic, secondary to cumulative UV radiation exposure/sun exposure over time - diffuse scaly erythematous macules with underlying dyspigmentation - Recommend daily broad spectrum sunscreen SPF 30+ to sun-exposed areas, reapply every 2 hours as needed.  - Call for new or changing lesions.  Hemangiomas - Red papules - Discussed benign nature - Observe - Call for any changes  Seborrheic Keratoses - Stuck-on, waxy, tan-brown papules and plaques  - Discussed benign etiology and prognosis. - Observe - Call for any changes   History of Basal Cell Carcinoma of the Skin - No evidence of recurrence today, nose, forehead - Recommend regular full body skin exams - Recommend daily broad spectrum sunscreen SPF 30+ to sun-exposed areas, reapply every 2 hours as needed.  - Call if any new or changing lesions are noted between office visits  History of Squamous Cell Carcinoma of the Skin - No evidence of recurrence today - Recommend regular full body skin exams - Recommend daily broad spectrum sunscreen SPF 30+ to sun-exposed areas, reapply every 2 hours as needed.  - Call if any new or changing lesions are noted between office visits  Return in about 6 months (around 10/24/2020) for UBSE.   Lindi Adie, CMA, am acting  as scribe for Brendolyn Patty, MD .  Documentation: I have reviewed the above documentation for accuracy and completeness, and I agree with the above.  Brendolyn Patty MD

## 2020-04-26 NOTE — Patient Instructions (Signed)
Cryotherapy Aftercare  . Wash gently with soap and water everyday.   . Apply Vaseline and Band-Aid daily until healed.  

## 2020-04-28 MED ORDER — HYDROCHLOROTHIAZIDE 12.5 MG PO CAPS: 12.5000 mg | ORAL_CAPSULE | Freq: Every day | ORAL | 1 refills | Status: DC

## 2020-04-28 NOTE — Addendum Note (Signed)
Addended by: Ria Bush on: 04/28/2020 05:18 PM   Modules accepted: Orders

## 2020-04-28 NOTE — Telephone Encounter (Signed)
Spoke with pt's wife relaying Dr. Synthia Innocent message.  She verbalizes understanding and will inform pt.

## 2020-04-28 NOTE — Telephone Encounter (Signed)
Have sent in extra hctz 12.5mg  tablet to take with his lisinopril hctz 20/12.5mg .

## 2020-04-28 NOTE — Telephone Encounter (Signed)
Recommend we add an extra hctz 12.5mg  to regimen.  Have they just refilled lisinpril hctz 20/12.5mg  or are they about to? If just refilled, would add hctz 12.5mg  pill to take daily.  If not yet refilled, would increase lisinopril hctz to 20/25mg  daily.

## 2020-04-28 NOTE — Telephone Encounter (Signed)
Spoke with pt's wife, [name] (on dpr), relaying Dr. Synthia Innocent message.  She verbalizes understanding stateing pt did just fill lisinopril-HCTZ about 1 wk ago.

## 2020-05-20 NOTE — Telephone Encounter (Signed)
Let's stop extra hctz, continue lisinopril 20/hctz 12.5mg  daily.  If BP creeping up, would likely add amlodipine 2.5mg  daily - this is the medicine that previously caused leg swelling but I think he should tolerate low dose well. Let us know how BPs run.

## 2020-05-20 NOTE — Telephone Encounter (Signed)
Lvm asking pt/pt's wife, [name] (on dpr), to call back.  Need to relay Dr. Synthia Innocent message.

## 2020-05-20 NOTE — Telephone Encounter (Signed)
returning the phone call

## 2020-05-20 NOTE — Addendum Note (Signed)
Addended by: Ria Bush on: 05/20/2020 12:02 PM   Modules accepted: Orders

## 2020-05-20 NOTE — Telephone Encounter (Signed)
Ms Vaneaton left v/m that pt has been taking HCTZ and BP med but pt is not sleeping due to getting up 5 - 6 times nightly to urinate. Does not think pt is tolerating the HCTZ and wants to know if pt can stop the HCTZ. Pt was [facility] at [facility] yesterday for an appt and BP was 144/70. Ms Treu said pts home cks systolic pressure has been below 150. Pt has been limiting salt in his diet also.  Request cb after reviewed by Dr Danise Mina.

## 2020-05-24 MED ORDER — AMLODIPINE BESYLATE 2.5 MG PO TABS: 2.5000 mg | ORAL_TABLET | Freq: Every day | ORAL | 3 refills | Status: DC

## 2020-05-24 NOTE — Telephone Encounter (Signed)
Amlodipine sent in

## 2020-05-24 NOTE — Telephone Encounter (Signed)
Spoke with pt's wife, [name] (on dpr), relaying Dr. Synthia Innocent message.  She verbalizes understanding.  States pt BP has been going up.  Last checked it was 178/78 on 05/21/19.  She requests low dose amlodipine be sent in.

## 2020-05-24 NOTE — Telephone Encounter (Signed)
Spoke with pt's wife, [name], notifying her rx was sent.

## 2020-05-24 NOTE — Addendum Note (Signed)
Addended by: Ria Bush on: 05/24/2020 09:37 AM   Modules accepted: Orders

## 2020-06-07 ENCOUNTER — Other Ambulatory Visit: Payer: Self-pay | Admitting: *Deleted

## 2020-06-07 NOTE — Telephone Encounter (Signed)
Patient's wife called stating that her husband's blood pressure seems to be responding well to Amlodipine. Pateint's wife stated that his blood pressure has been 144/76, 135/72 and 138/69.  Patient's wife stated if he is to continue the Amlodipine she would like to switch the medication to [facility] for a 90 day supply. Pharmacy [facility]

## 2020-06-10 MED ORDER — AMLODIPINE BESYLATE 2.5 MG PO TABS
2.5000 mg | ORAL_TABLET | Freq: Every day | ORAL | 3 refills | Status: DC
Start: 2020-06-10 — End: 2021-01-05

## 2020-06-10 NOTE — Telephone Encounter (Signed)
Ive sent in amlodipine 2.5mg  to [facility].

## 2020-06-15 ENCOUNTER — Emergency Department: Payer: Medicare Other

## 2020-06-15 ENCOUNTER — Emergency Department
Admission: EM | Admit: 2020-06-15 | Discharge: 2020-06-15 | Disposition: A | Discharge status: Home / Self Care | Payer: Medicare Other | Attending: Emergency Medicine | Admitting: Emergency Medicine

## 2020-06-15 ENCOUNTER — Other Ambulatory Visit: Payer: Self-pay

## 2020-06-15 DIAGNOSIS — R531 Weakness: Secondary | ICD-10-CM | POA: Insufficient documentation

## 2020-06-15 DIAGNOSIS — Z79899 Other long term (current) drug therapy: Secondary | ICD-10-CM | POA: Insufficient documentation

## 2020-06-15 DIAGNOSIS — E860 Dehydration: Secondary | ICD-10-CM | POA: Insufficient documentation

## 2020-06-15 DIAGNOSIS — G20 Parkinson's disease: Secondary | ICD-10-CM | POA: Diagnosis not present

## 2020-06-15 DIAGNOSIS — R42 Dizziness and giddiness: Secondary | ICD-10-CM | POA: Diagnosis not present

## 2020-06-15 DIAGNOSIS — I10 Essential (primary) hypertension: Secondary | ICD-10-CM | POA: Diagnosis not present

## 2020-06-15 DIAGNOSIS — Z85828 Personal history of other malignant neoplasm of skin: Secondary | ICD-10-CM | POA: Insufficient documentation

## 2020-06-15 DIAGNOSIS — R55 Syncope and collapse: Secondary | ICD-10-CM | POA: Insufficient documentation

## 2020-06-15 DIAGNOSIS — R197 Diarrhea, unspecified: Secondary | ICD-10-CM | POA: Diagnosis not present

## 2020-06-15 DIAGNOSIS — R4020 Unspecified coma: Secondary | ICD-10-CM | POA: Diagnosis not present

## 2020-06-15 DIAGNOSIS — R0902 Hypoxemia: Secondary | ICD-10-CM | POA: Diagnosis not present

## 2020-06-15 DIAGNOSIS — Z87891 Personal history of nicotine dependence: Secondary | ICD-10-CM | POA: Diagnosis not present

## 2020-06-15 DIAGNOSIS — I499 Cardiac arrhythmia, unspecified: Secondary | ICD-10-CM | POA: Diagnosis not present

## 2020-06-15 LAB — BASIC METABOLIC PANEL
Anion gap: 5 (ref 5–15)
BUN: 25 mg/dL — ABNORMAL HIGH (ref 8–23)
CO2: 30 mmol/L (ref 22–32)
Calcium: 10.4 mg/dL — ABNORMAL HIGH (ref 8.9–10.3)
Chloride: 103 mmol/L (ref 98–111)
Creatinine, Ser: 1.34 mg/dL — ABNORMAL HIGH (ref 0.61–1.24)
GFR, Estimated: 53 mL/min — ABNORMAL LOW (ref >60)
Glucose, Bld: 141 mg/dL — ABNORMAL HIGH (ref 70–99)
Potassium: 3.8 mmol/L (ref 3.5–5.1)
Sodium: 138 mmol/L (ref 135–145)

## 2020-06-15 LAB — CBC
HCT: 41.2 % (ref 39.0–52.0)
Hemoglobin: 13.9 g/dL (ref 13.0–17.0)
MCH: 30.1 pg (ref 26.0–34.0)
MCHC: 33.7 g/dL (ref 30.0–36.0)
MCV: 89.2 fL (ref 80.0–100.0)
Platelets: 178 10*3/uL (ref 150–400)
RBC: 4.62 MIL/uL (ref 4.22–5.81)
RDW: 13 % (ref 11.5–15.5)
WBC: 8.3 10*3/uL (ref 4.0–10.5)
nRBC: 0 % (ref 0.0–0.2)

## 2020-06-15 NOTE — ED Triage Notes (Signed)
First Nurse Note:  Arrives via [facility] from home.  Patient has history of dementia and parkinson's.  Per report, patient had an episode of diarrhea this morning and wife reports patient was unresponsive for an unknown amount of time.  VS wnl.  Patient awake and alert to baseline.  NAD

## 2020-06-15 NOTE — ED Notes (Signed)
Sandwich tray and sprite given.  Patient ambulated to bathroom with fam member.

## 2020-06-15 NOTE — ED Triage Notes (Addendum)
Pt comes via EMS from home with c/o episode of diarrhea and then went unresponsive with wife for unknown amount of time. Pt states diarrhea.  Pt denies any CP or SOB. Pt states some dizziness.  Wife reports she heard the pt groan and then later went to check on him he was sitting in a chair with his head back, arms straight and unresponsive.

## 2020-06-15 NOTE — ED Provider Notes (Signed)
[facility] Emergency Department Provider Note ____________________________________________   Event Date/Time   First MD Initiated Contact with Patient 06/15/20 1113     (approximate)  I have reviewed the triage vital signs and the nursing notes.  HISTORY  Chief Complaint Near Syncope   HPI Joshua Everett is a 86 y.o. malewho presents to the ED for evaluation of possible syncopal episode.  Chart review indicates history of Parkinson's disease and dementia, GERD, HLD, HTN and GERD.  Lives at home with wife.  Patient presents with his wife due to concerns for syncopal episode that she witnessed at home.  Patient cannot provide much relevant history due to his baseline dementia and Parkinson's disease.  Wife therefore provides majority of history.  Wife reports patient was normal yesterday and first thing this morning, had his normal breakfast this morning.  She reports being in another room when she heard him brown, as if he was stretching.  She reports that checking on him about 5 minutes later and that he was unresponsive, slumped backwards in a chair.  He came to about 5 minutes later without witnessed seizure activity, incontinence, tongue biting or additional syncopal episodes.  Wife reports that he seems normal now, "maybe a little bit slower, and complaining of dizziness."  Patient reports that he does feel dizzy while at rest, but he cannot elaborate on the symptoms to elucidate presyncope versus vertigo.  Wife reports that the only change to his chronic medications with the addition of amlodipine 2.5 mg last week.  She monitors his blood pressure at home, and noted improvement with systolics 696 prior to this, and systolics of 789 the past couple days.   Past Medical History:  Diagnosis Date  . Basal cell carcinoma 09/03/2014   right of midline forehead  . Basal cell carcinoma 03/25/2019   right lower nasal dorsum, EDC  . Basal cell carcinoma  03/30/2020   Mid nasal tip, EDC  . Basal cell carcinoma (BCC) 01/28/2015   right proximal nasal alar rim  . BPH without obstruction/lower urinary tract symptoms 09/08/2014  . Chronic constipation 09/08/2014  . GERD (gastroesophageal reflux disease)   . Glaucoma   . History of chicken pox   . Hyperlipidemia   . Obesity, Class I, BMI 30-34.9 09/08/2014  . Prediabetes 07/31/2015  . Rosacea 07/06/2015  . Squamous cell carcinoma of skin 01/30/2007   mid back, in situ  . Squamous cell carcinoma of skin 03/12/2018   right forearm, in situ  . Squamous cell carcinoma of skin 04/22/2018   left pretibial  . Squamous cell carcinoma of skin 09/24/2018   right hand dorsum, in situ  . Urine incontinence     Patient Active Problem List   Diagnosis Date Noted  . Pedal edema 08/29/2019  . Medicare annual wellness visit, subsequent 01/13/2019  . Parkinson's disease ([place]) 03/12/2018  . Abnormal gait 01/04/2018  . Overactive bladder 05/21/2017  . Chronic pain of right knee 01/01/2017  . Chest pain with moderate risk for cardiac etiology 08/17/2016  . Advanced care planning/counseling discussion 01/23/2016  . Prediabetes 07/31/2015  . Rosacea 07/06/2015  . Personal history of other malignant neoplasm of skin 11/19/2014  . Abdominal wall hernia 09/08/2014  . BPH without obstruction/lower urinary tract symptoms 09/08/2014  . Chronic constipation 09/08/2014  . Dyslipidemia 09/08/2014  . Glaucoma 09/08/2014  . GERD (gastroesophageal reflux disease) 09/08/2014  . Essential hypertension 09/08/2014  . Obesity, Class I, BMI 30-34.9 09/08/2014  . Nocturia 11/13/2012  Past Surgical History:  Procedure Laterality Date  . COLONOSCOPY  03/2009   1 TA, diverticulosis, rpt 5 yrs (Dr [facility] in [facility])  . ESOPHAGOGASTRODUODENOSCOPY  03/2009   small HH, gastric polyps biopsied, dilation for dysphagia ([place])  . [place]    Prior to Admission medications    Medication Sig Start Date End Date Taking? Authorizing Provider  amLODipine (NORVASC) 2.5 MG tablet Take 1 tablet (2.5 mg total) by mouth daily. 06/10/20   Ria Bush, MD  B Complex Vitamins (VITAMIN B COMPLEX PO) Take 1 tablet by mouth daily.    [provider]  bimatoprost (LUMIGAN) 0.01 % SOLN Place 1 drop into both eyes at bedtime.     [provider]  carbidopa-levodopa (SINEMET CR) 50-200 MG tablet Take 1 tablet by mouth at bedtime. 01/27/20   Ria Bush, MD  carbidopa-levodopa (SINEMET IR) 25-100 MG tablet Take 1 tablet at 8am, 2 tablets at 12pm, 1 tablet at 4pm 01/27/20   Ria Bush, MD  cholecalciferol (VITAMIN D) 1000 units tablet Take 1,000 Units by mouth daily.     [provider]  cycloSPORINE (RESTASIS) 0.05 % ophthalmic emulsion Place 1 drop into both eyes 2 (two) times daily.    [provider]  desmopressin (DDAVP) 0.2 MG tablet Take 0.6 mg by mouth at bedtime.     [provider]  docusate sodium (COLACE) 100 MG capsule Take 1 capsule (100 mg total) by mouth daily. 01/27/20   Ria Bush, MD  fenofibrate 160 MG tablet Take 1 tablet (160 mg total) by mouth daily. 01/27/20   Ria Bush, MD  linaclotide [facility]) 290 MCG CAPS capsule Take 1 capsule (290 mcg total) by mouth daily before breakfast. 04/01/19   Lucilla Lame, MD  lisinopril-hydrochlorothiazide (ZESTORETIC) 20-12.5 MG tablet Take 1 tablet by mouth daily. 01/27/20   Ria Bush, MD  Multiple Vitamins-Minerals (MULTIVITAMIN ADULT PO) Take 1 tablet by mouth daily.    [provider]  NON FORMULARY Take 2 drops by mouth daily. TINCTURE OF GARLIC    [provider]  pantoprazole (PROTONIX) 40 MG tablet Take 1 tablet (40 mg total) by mouth daily. 12/03/18   Lucilla Lame, MD  simvastatin (ZOCOR) 40 MG tablet TAKE 1 TABLET DAILY AT 6 P.M. 01/27/20   Ria Bush, MD    Allergies Solifenacin  Family History  Problem Relation  Age of Onset  . Diabetes Mother   . Alzheimer's disease Mother 62  . Kidney disease Father   . Cancer Father        brain tumor  . Cancer Sister        breast    Social History Social History   Tobacco Use  . Smoking status: Former Smoker    Quit date: 06/05/1968    Years since quitting: 52.0  . Smokeless tobacco: Never Used  Substance Use Topics  . Alcohol use: Not Currently    Alcohol/week: 0.0 standard drinks  . Drug use: No    Review of Systems  Unable to be adequately assessed due to patient's dementia. ____________________________________________   PHYSICAL EXAM:  VITAL SIGNS: Vitals:   06/15/20 1044  BP: (!) 161/59  Pulse: (!) 55  Resp: 18  Temp: 97.7 F (36.5 C)  SpO2: 100%      Constitutional: Alert and oriented. Well appearing and in no acute distress. Eyes: Conjunctivae are normal. PERRL. EOMI. Head: Atraumatic. Nose: No congestion/rhinnorhea. Mouth/Throat: Mucous membranes are moist.  Oropharynx non-erythematous.  Neck: No stridor. No cervical spine tenderness to palpation. Cardiovascular: Normal rate, regular rhythm. Grossly normal heart sounds.  Good peripheral circulation. Respiratory: Normal respiratory effort.  No retractions. Lungs CTAB. Gastrointestinal: Soft , nondistended, nontender to palpation. No CVA tenderness. Musculoskeletal: No lower extremity tenderness nor edema.  No joint effusions. No signs of acute trauma. Neurologic:  Normal speech and language. No gross focal neurologic deficits are appreciated.  Cranial nerves II through XII intact 5/5 strength and sensation in all 4 extremities Patient is able to intermittently stand and ambulate around the room, but stumbles towards the left side a couple times and wife reports that this is abnormal. Skin:  Skin is warm, dry and intact. No rash noted. Psychiatric: Mood and affect are normal. Speech and behavior are normal.  ____________________________________________   LABS (all labs  ordered are listed, but only abnormal results are displayed)  Labs Reviewed  BASIC METABOLIC PANEL - Abnormal; Notable for the following components:      Result Value   Glucose, Bld 141 (*)    BUN 25 (*)    Creatinine, Ser 1.34 (*)    Calcium 10.4 (*)    GFR, Estimated 53 (*)    All other components within normal limits  CBC  URINALYSIS, COMPLETE (UACMP) WITH MICROSCOPIC  CBG MONITORING, ED   ____________________________________________  12 Lead EKG  Sinus rhythm, rate of 55 bpm.  Normal axis and intervals.  1 PVC.  No evidence of acute ischemia. ____________________________________________  RADIOLOGY  ED MD interpretation:    Official radiology report(s): MR BRAIN WO CONTRAST  Result Date: 06/15/2020 CLINICAL DATA:  Dizziness EXAM: MRI HEAD WITHOUT CONTRAST TECHNIQUE: Multiplanar, multiecho pulse sequences of the brain and surrounding structures were obtained without intravenous contrast. COMPARISON:  November 2019 FINDINGS: Brain: There is no acute infarction or intracranial hemorrhage. There is no intracranial mass, mass effect, or edema. There is no hydrocephalus or extra-axial fluid collection. Prominence of the ventricles and sulci reflects generalized parenchymal volume loss. Patchy areas of T2 hyperintensity in the supratentorial white matter are nonspecific but may reflect mild to moderate chronic microvascular ischemic changes. Vascular: Major vessel flow voids at the skull base are preserved. Skull and upper cervical spine: Normal marrow signal is preserved. Sinuses/Orbits: Minor mucosal thickening.  Orbits are unremarkable. Other: Sella is unremarkable.  Mastoid air cells are clear. IMPRESSION: No evidence of recent infarction, hemorrhage, or mass. Stable chronic microvascular ischemic changes. Electronically Signed   By: Macy Mis M.D.   On: 06/15/2020 14:34    ____________________________________________   PROCEDURES and INTERVENTIONS  Procedure(s) performed  (including Critical Care):  Procedures  Medications - No data to display  ____________________________________________   MDM / ED COURSE   86 year old male with history of Parkinson's disease presents to the ED after a syncopal episode with dizziness, without evidence of acute CVA, and most significant for mild dehydration amenable to outpatient management.  Normal vitals on room air.  Exam with minimal stumbling while ambulating, otherwise neurologically intact without distress.  He does initially report some dizziness, that resolves after administration of p.o. fluids.  Blood work with mild stigmata of dehydration and mild AKI, for which he received p.o. fluids.  Due to his complaints of persistent dizziness, stumbling gait, MRI obtained to evaluate for posterior CNS lesion, and without evidence of acute cranial pathology.  He is asymptomatic and at his baseline, per wife at the bedside, after p.o. fluids.  I see no evidence of additional acute pathology to warrant further work-up  in the ED or hospitalization.  We discussed close follow-up with PCP and return precautions for the ED.  Patient medically stable for discharge home.   Clinical Course as of 06/15/20 1501  Tue Jun 15, 2020  1448 Reassessed.  Patient reports he feels well.  He has drank 1.5 cups of water and reports his dizziness has subsided.  Discussed work-up with patient, and wife who remains at the bedside, without evidence of acute pathology beyond mild dehydration.  We discussed following up with his PCP in the next week and we discussed return precautions to the ED. [DS]    Clinical Course User Index [DS] Vladimir Crofts, MD    ____________________________________________   FINAL CLINICAL IMPRESSION(S) / ED DIAGNOSES  Final diagnoses:  Dehydration  Weakness  Syncope, unspecified syncope type     ED Discharge Orders    None       Joshua Everett   Note:  This document was prepared using Dragon voice recognition  software and may include unintentional dictation errors.   Vladimir Crofts, MD 06/15/20 [phone]

## 2020-06-15 NOTE — Discharge Instructions (Signed)
As we discussed, his blood work showed mild signs of dehydration, but otherwise without problems.  MRI of his brain did not show any signs of stroke.   Please follow-up with his primary care physician in the next week to discuss this episode.  Continue to give him all of his home prescription medications, but keep a close eye on his blood pressure to ensure its not too low.  If he has any further episode such as this again, please return to the ED.

## 2020-06-22 ENCOUNTER — Other Ambulatory Visit: Payer: Self-pay

## 2020-06-22 ENCOUNTER — Encounter: Payer: Self-pay | Admitting: Family Medicine

## 2020-06-22 ENCOUNTER — Ambulatory Visit (INDEPENDENT_AMBULATORY_CARE_PROVIDER_SITE_OTHER): Payer: Medicare Other | Admitting: Family Medicine

## 2020-06-22 VITALS — BP 140/70 | HR 58 | Temp 97.7°F | Ht 69.75 in | Wt 213.1 lb

## 2020-06-22 DIAGNOSIS — G20 Parkinson's disease: Secondary | ICD-10-CM | POA: Diagnosis not present

## 2020-06-22 DIAGNOSIS — I10 Essential (primary) hypertension: Secondary | ICD-10-CM | POA: Diagnosis not present

## 2020-06-22 DIAGNOSIS — R55 Syncope and collapse: Secondary | ICD-10-CM | POA: Diagnosis not present

## 2020-06-22 DIAGNOSIS — G903 Multi-system degeneration of the autonomic nervous system: Secondary | ICD-10-CM

## 2020-06-22 MED ORDER — LISINOPRIL 20 MG PO TABS: 20.0000 mg | ORAL_TABLET | Freq: Every day | ORAL | 3 refills | Status: DC

## 2020-06-22 NOTE — Assessment & Plan Note (Signed)
Stable period - see below.

## 2020-06-22 NOTE — Assessment & Plan Note (Signed)
Appreciate neuro care.

## 2020-06-22 NOTE — Patient Instructions (Addendum)
I'm glad you're feeling better today.  Let's change blood pressure medication to lisinopril 20mg  daily and amlodipine 2.5mg  daily. Monitor blood pressure readings with this change.  Push small sips of fluids throughout the day.  Let me know if any recurrent episode to discuss further evaluation (heart ultrasound and/or see heart doctor)

## 2020-06-22 NOTE — Progress Notes (Signed)
Patient ID: Joshua Everett, male    DOB: 08-25-1934, 86 y.o.   MRN: [id number]  This visit was conducted in person.  BP 140/70 (BP Location: Left Arm, Patient Position: Sitting, Cuff Size: Large)   Pulse (!) 58   Temp 97.7 F (36.5 C) (Temporal)   Ht 5' 9.75" (1.772 m)   Wt 213 lb 2 oz (96.7 kg)   SpO2 98%   BMI 30.80 kg/m   Orthostatic VS for the past 24 hrs (Last 3 readings):  BP- Lying BP- Standing at 3 minutes  06/22/20 1438 - 144/70  06/22/20 1435 160/90 -    CC: ER f/u visit  Subjective:   HPI: Joshua Everett is a 86 y.o. male presenting on 06/22/2020 for Hospitalization Follow-up (Seen on 06/15/20 at [facility] ED, dx dehydration, weakness, syncope.  Pt accompanied by wife, [name]- temp 97.7.)   Recent ER visit 06/15/2020 for possible syncope. Records reviewed. Unwitnessed syncopal episode 1/11 found unresponsive in his recliner. Labs overall stable with Cr 1.34. brain MRI without acute change, chronic microvascular ischemic changes. Dizziness did improve after oral rehydration.  He was very confused after episode, he does not remember anything about episode. Wife thinks he was unconscious for about 7 minutes.   Amlodipine 2.5mg  recently added to regimen due to uncontrolled hypertension with improved control noted at home.     Known PD followed by [facility] Manuella Ghazi).  Melatonin recently started - he has not been taking.  On desmopressin for uncontrolled nocturia.   He's transitioned to drinking water with a straw      Relevant past medical, surgical, family and social history reviewed and updated as indicated. Interim medical history since our last visit reviewed. Allergies and medications reviewed and updated. Outpatient Medications Prior to Visit  Medication Sig Dispense Refill  . amLODipine (NORVASC) 2.5 MG tablet Take 1 tablet (2.5 mg total) by mouth daily. 90 tablet 3  . B Complex Vitamins (VITAMIN B COMPLEX PO) Take 1 tablet by mouth daily.    .  bimatoprost (LUMIGAN) 0.01 % SOLN Place 1 drop into both eyes at bedtime.     . carbidopa-levodopa (SINEMET CR) 50-200 MG tablet Take 1 tablet by mouth at bedtime.    . carbidopa-levodopa (SINEMET IR) 25-100 MG tablet Take 1 tablet at 8am, 2 tablets at 12pm, 1 tablet at 4pm    . cholecalciferol (VITAMIN D) 1000 units tablet Take 1,000 Units by mouth daily.     . cycloSPORINE (RESTASIS) 0.05 % ophthalmic emulsion Place 1 drop into both eyes 2 (two) times daily.    [name] Kitchen desmopressin (DDAVP) 0.2 MG tablet Take 0.6 mg by mouth at bedtime.     . docusate sodium (COLACE) 100 MG capsule Take 1 capsule (100 mg total) by mouth daily.    . fenofibrate 160 MG tablet Take 1 tablet (160 mg total) by mouth daily. 90 tablet 3  . linaclotide (LINZESS) 290 MCG CAPS capsule Take 1 capsule (290 mcg total) by mouth daily before breakfast. 90 capsule 3  . Multiple Vitamins-Minerals (MULTIVITAMIN ADULT PO) Take 1 tablet by mouth daily.    . NON FORMULARY Take 2 drops by mouth daily. TINCTURE OF GARLIC    . pantoprazole (PROTONIX) 40 MG tablet Take 1 tablet (40 mg total) by mouth daily. 90 tablet 3  . simvastatin (ZOCOR) 40 MG tablet TAKE 1 TABLET DAILY AT 6 P.M. 90 tablet 3  . lisinopril-hydrochlorothiazide (ZESTORETIC) 20-12.5 MG tablet Take 1 tablet by mouth daily. Zeigler  tablet 3   No facility-administered medications prior to visit.     Per HPI unless specifically indicated in ROS section below Review of Systems Objective:  BP 140/70 (BP Location: Left Arm, Patient Position: Sitting, Cuff Size: Large)   Pulse (!) 58   Temp 97.7 F (36.5 C) (Temporal)   Ht 5' 9.75" (1.772 m)   Wt 213 lb 2 oz (96.7 kg)   SpO2 98%   BMI 30.80 kg/m   Wt Readings from Last 3 Encounters:  06/22/20 213 lb 2 oz (96.7 kg)  01/27/20 208 lb 6 oz (94.5 kg)  01/16/20 214 lb (97.1 kg)      Physical Exam Vitals and nursing note reviewed.  Constitutional:      Appearance: Normal appearance. He is not ill-appearing.  Eyes:      Extraocular Movements: Extraocular movements intact.     Pupils: Pupils are equal, round, and reactive to light.  Neck:     Vascular: No carotid bruit.  Cardiovascular:     Rate and Rhythm: Normal rate and regular rhythm.     Pulses: Normal pulses.     Heart sounds: Normal heart sounds. No murmur heard.   Pulmonary:     Effort: Pulmonary effort is normal. No respiratory distress.     Breath sounds: Normal breath sounds. No wheezing, rhonchi or rales.  Musculoskeletal:     Cervical back: Normal range of motion. No rigidity.     Right lower leg: No edema.     Left lower leg: No edema.  Lymphadenopathy:     Cervical: No cervical adenopathy.  Skin:    General: Skin is warm and dry.     Findings: No rash.  Neurological:     Mental Status: He is alert.  Psychiatric:        Mood and Affect: Mood normal.        Behavior: Behavior normal.       Results for orders placed or performed during the hospital encounter of 30/09/23  Basic metabolic panel  Result Value Ref Range   Sodium 138 135 - 145 mmol/L   Potassium 3.8 3.5 - 5.1 mmol/L   Chloride 103 98 - 111 mmol/L   CO2 30 22 - 32 mmol/L   Glucose, Bld 141 (H) 70 - 99 mg/dL   BUN 25 (H) 8 - 23 mg/dL   Creatinine, Ser 1.34 (H) 0.61 - 1.24 mg/dL   Calcium 10.4 (H) 8.9 - 10.3 mg/dL   GFR, Estimated 53 (L) >60 mL/min   Anion gap 5 5 - 15  CBC  Result Value Ref Range   WBC 8.3 4.0 - 10.5 K/uL   RBC 4.62 4.22 - 5.81 MIL/uL   Hemoglobin 13.9 13.0 - 17.0 g/dL   HCT 41.2 39.0 - 52.0 %   MCV 89.2 80.0 - 100.0 fL   MCH 30.1 26.0 - 34.0 pg   MCHC 33.7 30.0 - 36.0 g/dL   RDW 13.0 11.5 - 15.5 %   Platelets 178 150 - 400 K/uL   nRBC 0.0 0.0 - 0.2 %   Assessment & Plan:  This visit occurred during the SARS-CoV-2 public health emergency.  Safety protocols were in place, including screening questions prior to the visit, additional usage of staff PPE, and extensive cleaning of exam room while observing appropriate contact time as indicated  for disinfecting solutions.   Problem List Items Addressed This Visit    Syncope - Primary    Unwitnessed syncope at home of unclear cause.  Reviewed possible causes. Reassuring ER evaluation reviewed. ?related to orthostasis given orthostatic drop noted on vitals today - see below. Encouraged good hydration status. Doubt hypoglycemia related. Advised to let us know if recurrent to consider carotid US, echocardiogram vs cardiology referral for eval, consider holter monitor.       Parkinson disease [facility])    Appreciate neuro care.       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 Reviewed importance of good hydration status.       Relevant Medications   lisinopril (ZESTRIL) 20 MG tablet   Essential hypertension    Stable period - see below.       Relevant Medications   lisinopril (ZESTRIL) 20 MG tablet       Meds ordered this encounter  Medications  . lisinopril (ZESTRIL) 20 MG tablet    Sig: Take 1 tablet (20 mg total) by mouth daily.    Dispense:  90 tablet    Refill:  3    In place of lisinopril/hctz como pill   No orders of the defined types were placed in this encounter.   Patient Instructions  I'm glad you're feeling better today.  Let's change blood pressure medication to lisinopril 20mg  daily and amlodipine 2.5mg  daily. Monitor blood pressure readings with this change.  Push small sips of fluids throughout the day.  Let me know if any recurrent episode to discuss further evaluation (heart ultrasound and/or see heart doctor)   Follow up plan: Return if symptoms worsen or fail to improve.  Ria Bush, MD

## 2020-06-22 NOTE — Assessment & Plan Note (Signed)
?  recent syncope related to orthostasis given orthostatic drop noted on vitals today - will discontinue HCTZ component, continue lisinopril 20mg  and amlodipine 2.5mg  daily, monitor BP and update with effect. Reviewed importance of good hydration status.

## 2020-06-22 NOTE — Assessment & Plan Note (Signed)
Unwitnessed syncope at home of unclear cause. Reviewed possible causes. Reassuring ER evaluation reviewed. ?related to orthostasis given orthostatic drop noted on vitals today - see below. Encouraged good hydration status. Doubt hypoglycemia related. Advised to let us know if recurrent to consider carotid US, echocardiogram vs cardiology referral for eval, consider holter monitor.

## 2020-06-29 DIAGNOSIS — F0280 Dementia in other diseases classified elsewhere without behavioral disturbance: Secondary | ICD-10-CM | POA: Diagnosis not present

## 2020-06-29 DIAGNOSIS — G20 Parkinson's disease: Secondary | ICD-10-CM | POA: Diagnosis not present

## 2020-06-29 DIAGNOSIS — R569 Unspecified convulsions: Secondary | ICD-10-CM | POA: Diagnosis not present

## 2020-06-29 DIAGNOSIS — G4752 REM sleep behavior disorder: Secondary | ICD-10-CM | POA: Diagnosis not present

## 2020-06-29 DIAGNOSIS — R441 Visual hallucinations: Secondary | ICD-10-CM | POA: Diagnosis not present

## 2020-07-02 DIAGNOSIS — R569 Unspecified convulsions: Secondary | ICD-10-CM | POA: Diagnosis not present

## 2020-07-05 ENCOUNTER — Telehealth: Payer: Self-pay | Admitting: *Deleted

## 2020-07-05 NOTE — Telephone Encounter (Signed)
Noted  

## 2020-07-05 NOTE — Telephone Encounter (Signed)
Patient's wife called stating that she got a strange e-mail from [facility] telling her to contact her husband's doctor for a refill on his Simvastatin. Patient's wife stated that the bottle that she has now shows no refills. Advised patient's wife that a new script was sent to [facility] on 01/27/20 #90/3. Advised patient's wife that she should contact [facility] and let them know they should have a script on file for her husband. Patient's wife stated that she will contact [facility] and update them on the script. Advised patient's wife to let our office know if there is a problem with them getting the script.  Patient's wife stated that she wanted to let Dr. Danise Mina know that her husband saw his neurologist Dr. Manuella Ghazi and he thinks that her husband had a seizure when he passed out. Patient's wife stated that her husband had an EEG done Friday but they have not gotten the results back yet.

## 2020-07-13 DIAGNOSIS — H401131 Primary open-angle glaucoma, bilateral, mild stage: Secondary | ICD-10-CM | POA: Diagnosis not present

## 2020-08-19 DIAGNOSIS — R569 Unspecified convulsions: Secondary | ICD-10-CM | POA: Diagnosis not present

## 2020-09-21 DIAGNOSIS — R569 Unspecified convulsions: Secondary | ICD-10-CM | POA: Diagnosis not present

## 2020-09-21 DIAGNOSIS — G20 Parkinson's disease: Secondary | ICD-10-CM | POA: Diagnosis not present

## 2020-09-21 DIAGNOSIS — F0280 Dementia in other diseases classified elsewhere without behavioral disturbance: Secondary | ICD-10-CM | POA: Diagnosis not present

## 2020-09-30 DIAGNOSIS — R9431 Abnormal electrocardiogram [ECG] [EKG]: Secondary | ICD-10-CM | POA: Diagnosis not present

## 2020-09-30 DIAGNOSIS — R001 Bradycardia, unspecified: Secondary | ICD-10-CM | POA: Diagnosis not present

## 2020-09-30 DIAGNOSIS — I10 Essential (primary) hypertension: Secondary | ICD-10-CM | POA: Diagnosis not present

## 2020-09-30 DIAGNOSIS — R0602 Shortness of breath: Secondary | ICD-10-CM | POA: Diagnosis not present

## 2020-09-30 DIAGNOSIS — I679 Cerebrovascular disease, unspecified: Secondary | ICD-10-CM | POA: Diagnosis not present

## 2020-09-30 DIAGNOSIS — E782 Mixed hyperlipidemia: Secondary | ICD-10-CM | POA: Diagnosis not present

## 2020-10-08 DIAGNOSIS — F0280 Dementia in other diseases classified elsewhere without behavioral disturbance: Secondary | ICD-10-CM | POA: Diagnosis not present

## 2020-10-08 DIAGNOSIS — G20 Parkinson's disease: Secondary | ICD-10-CM | POA: Diagnosis not present

## 2020-10-18 DIAGNOSIS — R351 Nocturia: Secondary | ICD-10-CM | POA: Diagnosis not present

## 2020-10-18 DIAGNOSIS — R001 Bradycardia, unspecified: Secondary | ICD-10-CM | POA: Diagnosis not present

## 2020-10-18 DIAGNOSIS — N401 Enlarged prostate with lower urinary tract symptoms: Secondary | ICD-10-CM | POA: Diagnosis not present

## 2020-10-25 ENCOUNTER — Encounter: Payer: Self-pay | Admitting: Dermatology

## 2020-10-25 ENCOUNTER — Ambulatory Visit (INDEPENDENT_AMBULATORY_CARE_PROVIDER_SITE_OTHER): Payer: Medicare Other | Admitting: Dermatology

## 2020-10-25 ENCOUNTER — Other Ambulatory Visit: Payer: Self-pay

## 2020-10-25 DIAGNOSIS — Z85828 Personal history of other malignant neoplasm of skin: Secondary | ICD-10-CM | POA: Diagnosis not present

## 2020-10-25 DIAGNOSIS — L738 Other specified follicular disorders: Secondary | ICD-10-CM

## 2020-10-25 DIAGNOSIS — L578 Other skin changes due to chronic exposure to nonionizing radiation: Secondary | ICD-10-CM | POA: Diagnosis not present

## 2020-10-25 DIAGNOSIS — L814 Other melanin hyperpigmentation: Secondary | ICD-10-CM

## 2020-10-25 DIAGNOSIS — L720 Epidermal cyst: Secondary | ICD-10-CM | POA: Diagnosis not present

## 2020-10-25 DIAGNOSIS — Z1283 Encounter for screening for malignant neoplasm of skin: Secondary | ICD-10-CM | POA: Diagnosis not present

## 2020-10-25 DIAGNOSIS — L820 Inflamed seborrheic keratosis: Secondary | ICD-10-CM | POA: Diagnosis not present

## 2020-10-25 DIAGNOSIS — D2262 Melanocytic nevi of left upper limb, including shoulder: Secondary | ICD-10-CM

## 2020-10-25 DIAGNOSIS — L570 Actinic keratosis: Secondary | ICD-10-CM | POA: Diagnosis not present

## 2020-10-25 DIAGNOSIS — D1800 Hemangioma unspecified site: Secondary | ICD-10-CM | POA: Diagnosis not present

## 2020-10-25 DIAGNOSIS — L821 Other seborrheic keratosis: Secondary | ICD-10-CM

## 2020-10-25 DIAGNOSIS — D229 Melanocytic nevi, unspecified: Secondary | ICD-10-CM | POA: Diagnosis not present

## 2020-10-25 DIAGNOSIS — L918 Other hypertrophic disorders of the skin: Secondary | ICD-10-CM | POA: Diagnosis not present

## 2020-10-25 NOTE — Patient Instructions (Addendum)

## 2020-10-25 NOTE — Progress Notes (Signed)
Follow-Up Visit   Subjective  Joshua Everett is a 86 y.o. male who presents for the following: Follow-up (Patient here for 6 month follow-up UBSE. He has  history of BCC, SCC, and Aks.). No new spots of concern today. Some scaly spots on face and body.   The following portions of the chart were reviewed this encounter and updated as appropriate:       Review of Systems:  No other skin or systemic complaints except as noted in HPI or Assessment and Plan.  Objective  Well appearing patient in no apparent distress; mood and affect are within normal limits.  All skin waist up examined.  Objective  Mid nasal tip: Well healed scar with no evidence of recurrence.   Objective  Right Upper Back, Left Lower Neck: Subcutaneous nodule.   Objective  R spinal upper back x 1, L elbow x 1 (2): Erythematous keratotic or waxy stuck-on papule  Objective  Left Shoulder: 3.66mm speckled brown macule  Objective  L cheek x 6, R forearm x 1, L forearm x 1, L temple x 1 (9): Pink scaly macules.  Objective  R mid cheek: 3.34mm pink yellow papule   Assessment & Plan   Skin cancer screening performed today.  Actinic Damage - chronic, secondary to cumulative UV radiation exposure/sun exposure over time - diffuse scaly erythematous macules with underlying dyspigmentation - Recommend daily broad spectrum sunscreen SPF 30+ to sun-exposed areas, reapply every 2 hours as needed.  - Recommend staying in the shade or wearing long sleeves, sun glasses (UVA+UVB protection) and wide brim hats (4-inch brim around the entire circumference of the hat). - Call for new or changing lesions.  Lentigines - Scattered tan macules - Due to sun exposure - Benign-appering, observe - Recommend daily broad spectrum sunscreen SPF 30+ to sun-exposed areas, reapply every 2 hours as needed. - Call for any changes  Seborrheic Keratoses - Stuck-on, waxy, tan-brown papules and/or plaques  - Benign-appearing -  Discussed benign etiology and prognosis. - Observe - Call for any changes  Hemangiomas - Red papules - Discussed benign nature - Observe - Call for any changes  Acrochordons (Skin Tags) - Fleshy, skin-colored pedunculated papules of the back - Benign appearing.  - Observe. - If desired, they can be removed with an in office procedure that is not covered by insurance. - Please call the clinic if you notice any new or changing lesions.  Melanocytic Nevi - Tan-brown and/or pink-flesh-colored symmetric macules and papules - Benign appearing on exam today - Observation - Call clinic for new or changing moles - Recommend daily use of broad spectrum spf 30+ sunscreen to sun-exposed areas.    History of Basal Cell Carcinoma of the Skin - No evidence of recurrence today - Recommend regular full body skin exams - Recommend daily broad spectrum sunscreen SPF 30+ to sun-exposed areas, reapply every 2 hours as needed.  - Call if any new or changing lesions are noted between office visits  History of Squamous Cell Carcinoma of the Skin - No evidence of recurrence today - No lymphadenopathy - Recommend regular full body skin exams - Recommend daily broad spectrum sunscreen SPF 30+ to sun-exposed areas, reapply every 2 hours as needed.  - Call if any new or changing lesions are noted between office visits History of basal cell carcinoma (BCC) Mid nasal tip  Clear. Observe for recurrence. Call clinic for new or changing lesions.  Recommend regular skin exams, daily broad-spectrum spf 30+ sunscreen use,  and photoprotection.     Epidermal inclusion cyst Right Upper Back, Left Lower Neck  Benign-appearing. Exam most consistent with an epidermal inclusion cyst. Discussed that a cyst is a benign growth that can grow over time and sometimes get irritated or inflamed. Recommend observation if it is not bothersome. Discussed option of surgical excision to remove it if it is growing, symptomatic,  or other changes noted. Please call for new or changing lesions so they can be evaluated.    Inflamed seborrheic keratosis (2) R spinal upper back x 1, L elbow x 1  Prior to procedure, discussed risks of blister formation, small wound, skin dyspigmentation, or rare scar following cryotherapy.    Destruction of lesion - R spinal upper back x 1, L elbow x 1  Destruction method: cryotherapy   Informed consent: discussed and consent obtained   Lesion destroyed using liquid nitrogen: Yes   Region frozen until ice ball extended beyond lesion: Yes   Outcome: patient tolerated procedure well with no complications   Post-procedure details: wound care instructions given    Nevus Left Shoulder  Benign-appearing.  Observation.  Call clinic for new or changing moles.  Recommend daily use of broad spectrum spf 30+ sunscreen to sun-exposed areas.    AK (actinic keratosis) (9) L cheek x 6, R forearm x 1, L forearm x 1, L temple x 1  Prior to procedure, discussed risks of blister formation, small wound, skin dyspigmentation, or rare scar following cryotherapy.   Will discuss topical 5FU/Calcipotriene Cream to face on follow-up in fall.  Destruction of lesion - L cheek x 6, R forearm x 1, L forearm x 1, L temple x 1  Destruction method: cryotherapy   Informed consent: discussed and consent obtained   Lesion destroyed using liquid nitrogen: Yes   Region frozen until ice ball extended beyond lesion: Yes   Outcome: patient tolerated procedure well with no complications   Post-procedure details: wound care instructions given    Sebaceous hyperplasia R mid cheek  Benign, observe.  Recheck on f/up   Return in about 6 months (around 04/27/2021) for AKs.   IJamesetta Orleans, CMA, am acting as scribe for Brendolyn Patty, MD .  Documentation: I have reviewed the above documentation for accuracy and completeness, and I agree with the above.  Brendolyn Patty MD

## 2020-11-23 DIAGNOSIS — R0602 Shortness of breath: Secondary | ICD-10-CM | POA: Diagnosis not present

## 2020-11-23 DIAGNOSIS — R9431 Abnormal electrocardiogram [ECG] [EKG]: Secondary | ICD-10-CM | POA: Diagnosis not present

## 2020-12-16 ENCOUNTER — Telehealth: Payer: Self-pay | Admitting: *Deleted

## 2020-12-16 MED ORDER — LISINOPRIL 20 MG PO TABS: 20.0000 mg | ORAL_TABLET | Freq: Every day | ORAL | 2 refills | Status: DC

## 2020-12-16 NOTE — Telephone Encounter (Signed)
Pt's wife left VM at Triage saying that all of pt's recent refills from [facility] all are still the lisinopril HCTZ combo but wife said that was d/c in Jan and lisinopril (plain) was sent in to [facility].   I called [facility] and the rep advised me that they are only sending pt regular lisinopril not the combo and last time they sent the lisinopril-hctz combo was 04/2020.   I called pt's wife and advised her of what [facility] told me. Wife went and got a bottle of pt's med and it said lisinopril HCTZ combo and it was filled on 09/06/20 through [facility]. I advise pt to call pharmacy directly and let them know since she has the bottle it will have the refill # on it and let them know that they are still sending wrong Rx. Wife said she will call them know and let them know but if there is any issues she will call us back.  FYI to PCP

## 2020-12-16 NOTE — Telephone Encounter (Signed)
Noted. Thank you.  Keep [place] updated on blood pressures on new plain lisinopril. If sitting BPs running higher, I'd like them to check standing blood pressures and let us know both sitting and standing readings.

## 2020-12-16 NOTE — Telephone Encounter (Signed)
Please see prev comments. Wife called back she spoke to [facility] and she gave them pt's Rx # off the bottle. They claim that the Rx # is linked to Lisinopril (plain) but wife read the med name to them Lisinopril-HCTZ combo off the bottle. They stated that to get this resolved they need a new Rx for lisinopril plan. Lisinopril 20mg  resent to [facility]

## 2020-12-17 DIAGNOSIS — R9431 Abnormal electrocardiogram [ECG] [EKG]: Secondary | ICD-10-CM | POA: Diagnosis not present

## 2020-12-17 DIAGNOSIS — R0602 Shortness of breath: Secondary | ICD-10-CM | POA: Diagnosis not present

## 2020-12-20 NOTE — Telephone Encounter (Signed)
Spoke with pt's wife, [name] (on dpr), relaying Dr. Synthia Innocent message.  Verbalizes understanding.

## 2020-12-21 DIAGNOSIS — E782 Mixed hyperlipidemia: Secondary | ICD-10-CM | POA: Diagnosis not present

## 2020-12-21 DIAGNOSIS — I10 Essential (primary) hypertension: Secondary | ICD-10-CM | POA: Diagnosis not present

## 2020-12-21 DIAGNOSIS — R001 Bradycardia, unspecified: Secondary | ICD-10-CM | POA: Diagnosis not present

## 2020-12-28 NOTE — Telephone Encounter (Signed)
Plz notify BPs initially high but are doing better the last few days - continue current medicines, let us know if again trending up.  Per wife's chart: Blood Pressure for Shi Bayerl 1935-01-27 151/72 Sitting 144/77 Standing 7/ 18/22 148/70 Sitting at doctors office 161/82 Sitting 152/79 Standing 12/22/20 128/71 Sitting 133/71 Standing 12/23/20 131/67 Sitting 130/69 Standing 12/24/20 I'm not sure why his pressure came down some but I'm happy to see some improvement. Ranae Plumber

## 2020-12-29 NOTE — Telephone Encounter (Signed)
[name], patient's wife, advised.

## 2020-12-30 ENCOUNTER — Encounter: Payer: Self-pay | Admitting: Emergency Medicine

## 2020-12-30 ENCOUNTER — Ambulatory Visit: Admission: EM | Admit: 2020-12-30 | Discharge: 2020-12-30 | Payer: Medicare Other

## 2020-12-30 ENCOUNTER — Emergency Department: Payer: Medicare Other

## 2020-12-30 ENCOUNTER — Emergency Department
Admission: EM | Admit: 2020-12-30 | Discharge: 2020-12-30 | Disposition: A | Discharge status: Home / Self Care | Payer: Medicare Other | Attending: Emergency Medicine | Admitting: Emergency Medicine

## 2020-12-30 ENCOUNTER — Other Ambulatory Visit: Payer: Self-pay

## 2020-12-30 DIAGNOSIS — R42 Dizziness and giddiness: Secondary | ICD-10-CM | POA: Diagnosis not present

## 2020-12-30 DIAGNOSIS — Z87891 Personal history of nicotine dependence: Secondary | ICD-10-CM | POA: Insufficient documentation

## 2020-12-30 DIAGNOSIS — I160 Hypertensive urgency: Secondary | ICD-10-CM | POA: Diagnosis not present

## 2020-12-30 DIAGNOSIS — R030 Elevated blood-pressure reading, without diagnosis of hypertension: Secondary | ICD-10-CM

## 2020-12-30 DIAGNOSIS — I10 Essential (primary) hypertension: Secondary | ICD-10-CM | POA: Diagnosis not present

## 2020-12-30 DIAGNOSIS — G20 Parkinson's disease: Secondary | ICD-10-CM | POA: Diagnosis not present

## 2020-12-30 DIAGNOSIS — Z79899 Other long term (current) drug therapy: Secondary | ICD-10-CM | POA: Diagnosis not present

## 2020-12-30 DIAGNOSIS — Z20822 Contact with and (suspected) exposure to covid-19: Secondary | ICD-10-CM | POA: Insufficient documentation

## 2020-12-30 DIAGNOSIS — Z85828 Personal history of other malignant neoplasm of skin: Secondary | ICD-10-CM | POA: Diagnosis not present

## 2020-12-30 LAB — BASIC METABOLIC PANEL
Anion gap: 9 (ref 5–15)
BUN: 20 mg/dL (ref 8–23)
CO2: 28 mmol/L (ref 22–32)
Calcium: 10.1 mg/dL (ref 8.9–10.3)
Chloride: 108 mmol/L (ref 98–111)
Creatinine, Ser: 1.03 mg/dL (ref 0.61–1.24)
GFR, Estimated: >60 mL/min (ref >60)
Glucose, Bld: 161 mg/dL — ABNORMAL HIGH (ref 70–99)
Potassium: 3.5 mmol/L (ref 3.5–5.1)
Sodium: 145 mmol/L (ref 135–145)

## 2020-12-30 LAB — CBC
HCT: 41.5 % (ref 39.0–52.0)
Hemoglobin: 14.3 g/dL (ref 13.0–17.0)
MCH: 31.4 pg (ref 26.0–34.0)
MCHC: 34.5 g/dL (ref 30.0–36.0)
MCV: 91.2 fL (ref 80.0–100.0)
Platelets: 170 10*3/uL (ref 150–400)
RBC: 4.55 MIL/uL (ref 4.22–5.81)
RDW: 13 % (ref 11.5–15.5)
WBC: 7.5 10*3/uL (ref 4.0–10.5)
nRBC: 0 % (ref 0.0–0.2)

## 2020-12-30 LAB — RESP PANEL BY RT-PCR (FLU A&B, COVID) ARPGX2
Influenza A by PCR: NEGATIVE
Influenza B by PCR: NEGATIVE
SARS Coronavirus 2 by RT PCR: NEGATIVE

## 2020-12-30 LAB — TROPONIN I (HIGH SENSITIVITY)
Troponin I (High Sensitivity): 4 ng/L (ref <18)
Troponin I (High Sensitivity): 9 ng/L (ref <18)

## 2020-12-30 MED ORDER — HYDRALAZINE HCL 20 MG/ML IJ SOLN
20.0000 mg | Freq: Once | INTRAMUSCULAR | Status: AC
  Administered 2020-12-30: 20 mg via INTRAVENOUS
  Filled 2020-12-30: qty 1

## 2020-12-30 NOTE — Discharge Instructions (Addendum)
Average her blood pressures throughout the day to.  Take multiple readings throughout the day and find the average.  If you find that the readings are reading 185 top number or higher take 3 of the amlodipine medication for a total of 10 mg in 1 day.  If blood pressures remain high then present to the emergency department for evaluation.  If he becomes symptomatic with severe headache, vision changes, weakness present to the emergency department.  Make sure you follow-up with primary care in regards to varying blood pressures.

## 2020-12-30 NOTE — Telephone Encounter (Addendum)
Pts wife (DPR signed) called and pt started feeling dizzy little while ago; BP rt arm 188/91 P 59;  pt rested with feet up and recked BP lt arm 207/96 P 55 and  Rt arm BP 213/101. Pt said he felt lightheaded earlier and that was when cked first BP. Pt does not have H/A, CP, SOB or vision changes. Pts wife said that pt still feels lightheaded and weird but cannot describe any further.  Pt took Amlodipine 2.5 mg this morning and Lisinopril 20 mg this morning also. Pt said he did not want any lunch to eat. Pt has done usual activity this morning; pt has not been outside and has not been in the heat.  Pts wife said has been monitoring BP and BP has been good until today. Pt has not gotten upset or no pain and no reason that BP would have gone up that much. No available appts at [facility] and pts wife is going to take him to [facility] on [address] in [place]. 911 declined. Pt recently saw card with good visit but Mrs Drewett said she does not want pt to have a stroke.Mrs Worlds will call with update on how pt is feeling and BP on 12/31/20 also.sending note to Dr Darnell Level who is out of office, Dr Damita Dunnings who is in office and Lattie Haw CMA.

## 2020-12-30 NOTE — Telephone Encounter (Signed)
Noted, routed to PCP.

## 2020-12-30 NOTE — ED Notes (Signed)
Provider called to room for concerns of HTN and dizziness. Pt referred to ED. Stable on leaving.

## 2020-12-30 NOTE — Discharge Instructions (Addendum)
Your current condition warrants further evaluation and/or treatment which exceed services available to you in this urgent care setting. I have discussed with you your currrent condition and the need for further evaluation and/or treatment in an emergency department setting. In response to my medical recommendation, you have opted to go to the emergency department at:  [facility]  

## 2020-12-30 NOTE — ED Provider Notes (Signed)
[facility] Emergency Department Provider Note ____________________________________________   Event Date/Time   First MD Initiated Contact with Patient 12/30/20 1836     (approximate)  I have reviewed the triage vital signs and the nursing notes.  HISTORY  Chief Complaint Dizziness and Hypertension   HPI Joshua Everett is a 86 y.o. malewho presents to the ED for evaluation of hypertension and dizziness  Chart review indicates history of HTN, HLD, GERD, previous smoking.  Parkinson's disease.  Patient dents to the ED, accompanied by his wife, for evaluation of feeling poorly, dizziness in the setting of hypertension at home.  They report that he was "normal" and asymptomatic this morning and has being feeling well recently without recent illnesses.  Wife reports noting that patient called out early this afternoon indicating that he felt "weird" and in generalized fashion did not feel well.  He relays symptoms of dizziness that he has difficulty describing.  Denies presyncope and denies vertigo, just says he does not feel right in a generalized fashion.  Denies any falls, trauma or syncopal episodes.  Denies headache or pain anywhere.  Denies chest pain, shortness of breath, lower extremity swelling, orthopnea.  Past Medical History:  Diagnosis Date   Actinic keratosis    Basal cell carcinoma 09/03/2014   right of midline forehead   Basal cell carcinoma 03/25/2019   right lower nasal dorsum, [facility]   Basal cell carcinoma 03/30/2020   Mid nasal tip, [facility]   Basal cell carcinoma (BCC) 01/28/2015   right proximal nasal alar rim   BPH without obstruction/lower urinary tract symptoms 09/08/2014   Chronic constipation 09/08/2014   GERD (gastroesophageal reflux disease)    Glaucoma    History of chicken pox    Hyperlipidemia    Obesity, Class I, BMI 30-34.9 09/08/2014   Prediabetes 07/31/2015   Rosacea 07/06/2015   Squamous cell carcinoma of skin 01/30/2007    mid back, in situ   Squamous cell carcinoma of skin 03/12/2018   right forearm, in situ   Squamous cell carcinoma of skin 04/22/2018   left pretibial   Squamous cell carcinoma of skin 09/24/2018   right hand dorsum, in situ   Urine incontinence     Patient Active Problem List   Diagnosis Date Noted   Syncope 06/22/2020   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[place]) 06/22/2020   Pedal edema 08/29/2019   Medicare annual wellness visit, subsequent 01/13/2019   Parkinson disease ([place]) 03/12/2018   Abnormal gait 01/04/2018   Overactive bladder 05/21/2017   Chronic pain of right knee 01/01/2017   Chest pain with moderate risk for cardiac etiology 08/17/2016   Advanced care planning/counseling discussion 01/23/2016   Prediabetes 07/31/2015   Rosacea 07/06/2015   Personal history of other malignant neoplasm of skin 11/19/2014   Abdominal wall hernia 09/08/2014   BPH without obstruction/lower urinary tract symptoms 09/08/2014   Chronic constipation 09/08/2014   Dyslipidemia 09/08/2014   Glaucoma 09/08/2014   GERD (gastroesophageal reflux disease) 09/08/2014   Essential hypertension 09/08/2014   Obesity, Class I, BMI 30-34.9 09/08/2014   Nocturia 11/13/2012    Past Surgical History:  Procedure Laterality Date   COLONOSCOPY  03/2009   1 TA, diverticulosis, rpt 5 yrs (Dr Ac[facility]an[facility] in [facility])   ESOPHAGOGASTRODUODENOSCOPY  03/2009   small HH, gastric polyps biopsied, dilation for dysphagia ([place])   [place]    Prior to Admission medications   Medication Sig Start Date End  Date Taking? Authorizing Provider  amLODipine (NORVASC) 2.5 MG tablet Take 1 tablet (2.5 mg total) by mouth daily. 06/10/20   Ria Bush, MD  B Complex Vitamins (VITAMIN B COMPLEX PO) Take 1 tablet by mouth daily.    [provider]  bimatoprost (LUMIGAN) 0.01 % SOLN Place 1 drop into both eyes at bedtime.     [provider]   carbidopa-levodopa (SINEMET CR) 50-200 MG tablet Take 1 tablet by mouth at bedtime. 01/27/20   Ria Bush, MD  carbidopa-levodopa (SINEMET IR) 25-100 MG tablet Take 1 tablet at 8am, 2 tablets at 12pm, 1 tablet at 4pm 01/27/20   Ria Bush, MD  cholecalciferol (VITAMIN D) 1000 units tablet Take 1,000 Units by mouth daily.     [provider]  cycloSPORINE (RESTASIS) 0.05 % ophthalmic emulsion Place 1 drop into both eyes 2 (two) times daily.    [provider]  desmopressin (DDAVP) 0.2 MG tablet Take 0.6 mg by mouth at bedtime.     [provider]  docusate sodium (COLACE) 100 MG capsule Take 1 capsule (100 mg total) by mouth daily. 01/27/20   Ria Bush, MD  fenofibrate 160 MG tablet Take 1 tablet (160 mg total) by mouth daily. 01/27/20   Ria Bush, MD  linaclotide [facility]) 290 MCG CAPS capsule Take 1 capsule (290 mcg total) by mouth daily before breakfast. 04/01/19   Lucilla Lame, MD  lisinopril (ZESTRIL) 20 MG tablet Take 1 tablet (20 mg total) by mouth daily. 12/16/20   Ria Bush, MD  Multiple Vitamins-Minerals (MULTIVITAMIN ADULT PO) Take 1 tablet by mouth daily.    [provider]  NON FORMULARY Take 2 drops by mouth daily. TINCTURE OF GARLIC    [provider]  pantoprazole (PROTONIX) 40 MG tablet Take 1 tablet (40 mg total) by mouth daily. 12/03/18   Lucilla Lame, MD  rivastigmine (EXELON) 1.5 MG capsule Take 1.5 mg by mouth 2 (two) times daily. 09/21/20   [provider]  simvastatin (ZOCOR) 40 MG tablet TAKE 1 TABLET DAILY AT 6 P.M. 01/27/20   Ria Bush, MD    Allergies Solifenacin  Family History  Problem Relation Age of Onset   Diabetes Mother    Alzheimer's disease Mother 45   Kidney disease Father    Cancer Father        brain tumor   Cancer Sister        breast    Social History Social History   Tobacco Use   Smoking status: Former    Types: Cigarettes    Quit date: 06/05/1968     Years since quitting: 52.6   Smokeless tobacco: Never  Substance Use Topics   Alcohol use: Not Currently    Alcohol/week: 0.0 standard drinks   Drug use: No    Review of Systems  Constitutional: No fever/chills.  Positive generalized sensation of feeling unwell Eyes: No visual changes. ENT: No sore throat. Cardiovascular: Denies chest pain. Respiratory: Denies shortness of breath. Gastrointestinal: No abdominal pain.  No nausea, no vomiting.  No diarrhea.  No constipation. Genitourinary: Negative for dysuria. Musculoskeletal: Negative for back pain. Skin: Negative for rash. Neurological: Negative for headaches, focal weakness or numbness.   ____________________________________________   PHYSICAL EXAM:  VITAL SIGNS: Vitals:   12/30/20 1506  BP: (!) 178/80  Pulse: (!) 54  Resp: 16  Temp: 97.8 F (36.6 C)  SpO2: 95%    Constitutional: Alert and oriented. Well appearing and in no acute distress.  Slow movements  and slow responses to questions consistent with parkinsonism Eyes: Conjunctivae are normal. PERRL. EOMI. Head: Atraumatic. Nose: No congestion/rhinnorhea. Mouth/Throat: Mucous membranes are moist.  Oropharynx non-erythematous. Neck: No stridor. No cervical spine tenderness to palpation. Cardiovascular: Normal rate, regular rhythm. Grossly normal heart sounds.  Good peripheral circulation. Respiratory: Normal respiratory effort.  No retractions. Lungs CTAB. Gastrointestinal: Soft , nondistended, nontender to palpation. No CVA tenderness. Musculoskeletal: No lower extremity tenderness nor edema.  No joint effusions. No signs of acute trauma. Neurologic:  Normal speech and language. No gross focal neurologic deficits are appreciated.  Cranial nerves II through XII intact 5/5 strength and sensation in all 4 extremities Skin:  Skin is warm, dry and intact. No rash noted. Psychiatric: Mood and affect are normal. Speech and behavior are  normal. ____________________________________________   LABS (all labs ordered are listed, but only abnormal results are displayed)  Labs Reviewed  BASIC METABOLIC PANEL - Abnormal; Notable for the following components:      Result Value   Glucose, Bld 161 (*)    All other components within normal limits  RESP PANEL BY RT-PCR (FLU A&B, COVID) ARPGX2  CBC  URINALYSIS, COMPLETE (UACMP) WITH MICROSCOPIC  CBG MONITORING, ED  TROPONIN I (HIGH SENSITIVITY)  TROPONIN I (HIGH SENSITIVITY)   ____________________________________________  12 Lead EKG  Sinus rhythm, rate of 60 bpm.  Normal axis and intervals.  No evidence of acute ischemia. ____________________________________________  RADIOLOGY  ED MD interpretation:    Official radiology report(s): No results found.  ____________________________________________   PROCEDURES and INTERVENTIONS  Procedure(s) performed (including Critical Care):  .Critical Care  Date/Time: 12/30/2020 7:34 PM Performed by: Vladimir Crofts, MD Authorized by: Vladimir Crofts, MD   Critical care provider statement:    Critical care time (minutes):  30   Critical care was necessary to treat or prevent imminent or life-threatening deterioration of the following conditions:  Circulatory failure   Critical care was time spent personally by me on the following activities:  Discussions with consultants, evaluation of patient's response to treatment, examination of patient, ordering and performing treatments and interventions, ordering and review of laboratory studies, ordering and review of radiographic studies, pulse oximetry, re-evaluation of patient's condition, obtaining history from patient or surrogate and review of old charts .1-3 Lead EKG Interpretation  Date/Time: 12/30/2020 7:34 PM Performed by: Vladimir Crofts, MD Authorized by: Vladimir Crofts, MD     Interpretation: normal     ECG rate:  60   ECG rate assessment: normal     Rhythm: sinus rhythm      Ectopy: none     Conduction: normal    Medications  hydrALAZINE (APRESOLINE) injection 20 mg (has no administration in time range)    ____________________________________________   MDM / ED COURSE   86 year old male with a history of hypertension, presents to the ED with evidence of hypertensive urgency, requiring IV antihypertensives and likely observation medical admission.  Triaged with BP 178/80, progressing to 123456 systolic during my examination.  He continues to have some vague symptoms of generalized feeling unwell and dizziness, though has no evidence of focal neurologic deficits on my examination.  Due to his continued symptomatic nature, we will provide IV antihypertensives with hydralazine and perform CT head to ensure no ICH or SAH.  After this anticipate observation medical admission for titration of his antihypertensive regimen.  Clinical Course as of 12/30/20 1936  Thu Dec 30, 2020  1925 Repeat BP with systolic [id number] while I am in the room, repeated a few  minutes later and 215.  Discussed plan of care with patient and wife with IV antihypertensive medications, CT of his head and possible observation admission.  They are in agreement. [DS]    Clinical Course User Index [DS] Vladimir Crofts, MD    ____________________________________________   FINAL CLINICAL IMPRESSION(S) / ED DIAGNOSES  Final diagnoses:  Hypertensive urgency  Dizziness     ED Discharge Orders     None        Cristy Colmenares Tamala Julian   Note:  This document was prepared using Dragon voice recognition software and may include unintentional dictation errors.    Vladimir Crofts, MD 12/30/20 [phone]

## 2020-12-30 NOTE — Progress Notes (Signed)
Called to admit patient for hypertension.  Per ER provider patient with systolic blood pressure in the 200s. Patient is an 86 year old male with a history of hypertension and Parkinson's disease who presents to the ER accompanied by his wife for evaluation of hypertension. His wife who provided most of the history states that patient complained of not feeling well earlier in the day and she checked his blood pressure and he had a systolic blood pressure in the 200s. She has been keeping a blood pressure log for the patient for the last couple of days and his blood pressures readings have been within normal limits between [id number] systolic. She attempted to contact patient's primary care provider but was unable to reach him and was advised to take the patient on urgent care clinic.  At the urgent care clinic his systolic blood pressure was 210 and he was referred to the emergency room. Patient takes amlodipine 2.5 mg daily and lisinopril 20 mg daily and has taken both medications this morning prior to his presentation. He received a dose of hydralazine 20 mg IV in the ER and during my evaluation his blood pressure was 148/86 Patient is back to his baseline and denies having any complaints.  He and his wife are okay with being discharged home to follow-up with his primary care provider and to continue ambulatory blood pressure checks at home. This has been discussed with ER provider

## 2020-12-30 NOTE — ED Notes (Signed)
Pt was sent to ED via POV per Serafina Royals, NP for potential hypertensive emergency.

## 2020-12-30 NOTE — ED Notes (Signed)
Patient transported to CT 

## 2020-12-30 NOTE — ED Provider Notes (Signed)
Patient presents with elevated blood pressure at home along with dizziness.  The patient's blood pressure at home has been running above 123456 systolic.  Patient is currently stable, but given that his blood pressure is elevated along with symptoms, the patient will need to be seen in the emergency department for further investigation.  Patient presented to [facility] emergency department via private vehicle.  Patient stable on discharge.   Serafina Royals, [place] 12/30/20 1429

## 2020-12-30 NOTE — ED Triage Notes (Signed)
Pt presents to the ED with c/o dizziness and hypertension that began today at noon. Pt's wife reports several reading over [id number] systolic. Pt notified PCP who told him to come to ED

## 2020-12-30 NOTE — ED Provider Notes (Signed)
-----------------------------------------   10:10 PM on 12/30/2020 -----------------------------------------  Blood pressure (!) 148/66, pulse 66, temperature 97.8 F (36.6 C), temperature source Oral, resp. rate 18, height '5\' 10"'$  (1.778 m), weight 90.7 kg, SpO2 97 %.  Assuming care from Dr. Tamala Julian.  In short, Joshua Everett is a 86 y.o. male with a chief complaint of Dizziness and Hypertension .  Refer to the original H&P for additional details.  The current plan of care is to await CT scan and admit to the hospitalist service.  Patient arrived with hypertensive urgency, was feeling dizzy, was noted to have blood pressures in the 123456 systolic.  Plan was to await CT imaging to ensure no intracranial hemorrhage and give hydralazine and admit to the hospital service.  Patient CT returned without acute intracranial abnormality.  Patient did have his hydralazine and steadily improved with blood pressures dropping into the 140s over 60s.  I discussed the patient with the hospitalist service but given the significant improvement on hydralazine they felt like the patient did not require admission given the fact there was no neurodeficits on exam, imaging and labs are overall reassuring, and patient's pressure has improved.  Patient's most recent blood pressure was 148/66.  He remains neurologically intact..  At this time had a lengthy discussion with the patient and his wife regarding his blood pressure and his improvement after the hydralazine.  Both the patient and the wife would like to be discharged and states that as he has had significant improvement they are anxious to go home.  Given the otherwise reassuring work-up and the fact that patient's blood pressure has improved with the hydralazine I feel comfortable with the patient being discharged.  We have had a lengthy discussion about return precautions and they are in agreement with same.  Follow-up with primary care in regards to the patient's  fluctuating blood pressure over the past several weeks.  Again strict return precautions are discussed with the patient.  We discussed the use of increasing the patient's Norvasc at home if he has acutely elevated blood pressures.  Patient will increase the dose to 10 mg if he finds that his blood pressures are trending upwards.  If this is not alleviating rising hypertension he will return to the ED.  Again concerning neuro deficits are discussed.   ED diagnosis: Hypertensive urgency    Guerry Minors Jarome Lamas 12/30/20 2329    Vladimir Crofts, MD 12/31/20 [phone]

## 2021-01-02 NOTE — Telephone Encounter (Signed)
Seen at ER with hypertensive urgency, labs and head imaging reassuring.  How are blood pressures? I don't believe any med changes made.

## 2021-01-03 NOTE — Telephone Encounter (Signed)
Spoke with pt's wife, [name] (on dpr), asking how BP is running.  States it's up and down but today was better.  BP this morning was 131/69.  Says she will bring a log to 8/3 ER f/u.

## 2021-01-04 ENCOUNTER — Other Ambulatory Visit: Payer: Self-pay

## 2021-01-05 ENCOUNTER — Ambulatory Visit (INDEPENDENT_AMBULATORY_CARE_PROVIDER_SITE_OTHER): Payer: Medicare Other | Admitting: Family Medicine

## 2021-01-05 ENCOUNTER — Encounter: Payer: Self-pay | Admitting: Family Medicine

## 2021-01-05 VITALS — BP 164/66 | HR 64 | Temp 97.9°F | Ht 70.0 in | Wt 206.4 lb

## 2021-01-05 DIAGNOSIS — I10 Essential (primary) hypertension: Secondary | ICD-10-CM

## 2021-01-05 DIAGNOSIS — G20 Parkinson's disease: Secondary | ICD-10-CM

## 2021-01-05 DIAGNOSIS — G903 Multi-system degeneration of the autonomic nervous system: Secondary | ICD-10-CM

## 2021-01-05 MED ORDER — AMLODIPINE BESYLATE 5 MG PO TABS: 5.0000 mg | ORAL_TABLET | Freq: Every day | ORAL | 1 refills | Status: DC

## 2021-01-05 NOTE — Progress Notes (Signed)
Patient ID: Joshua Everett, male    DOB: 07-Jan-1935, 86 y.o.   MRN: [id number]  This visit was conducted in person.  BP (!) 164/66   Pulse 64   Temp 97.9 F (36.6 C) (Temporal)   Ht '5\' 10"'$  (1.778 m)   Wt 206 lb 6 oz (93.6 kg)   SpO2 97%   BMI 29.61 kg/m   Orthostatic VS for the past 24 hrs (Last 3 readings):  BP- Lying BP- Standing at 3 minutes  01/05/21 1208 -- 158/60  01/05/21 1205 172/88 --   CC: ER f/u visit  Subjective:   HPI: Joshua Everett is a 86 y.o. male presenting on 01/05/2021 for Hospitalization Follow-up (Seen on 12/30/20 at [facility], dx elevated BP;  dizziness and giddiness.  Then sent to [facility] ED, dx hypertensive urgency.  Still c/o dizziness and HA. Pt accompanied by wife, [name]- temp 98.1.)   Known Parkinson's disease followed by neurology Manuella Ghazi), concern for new seizures 06/2020 EEG abnormal. Rivastigmine ([place]) started recently, with improvement in hallucinations and recognition (ie wife).   06/2020 we discontinued HCTZ due to concern for orthostatic hypotension.   Recent ER visit 12/30/2020 for dizziness with hypertensive urgency with BP up to 123456 systolic. Workup reassuring including EKG, head CT, labwork including troponin x2. Treated with IV hydralazine with improvement in blood pressures.   Since home ongoing dizziness, headache. Describes denies vertigo or presyncope/syncope. Dizziness more described as unsteadiness/lightheadedness. Headache is located to posterior head without radiation. No vision changes, no chest pain/tightness dyspnea or palpitations.  Brings BP log - wide fluctuations 130-200/60-90, HR 50-60s.  Overall drinking sufficient fluids.      Relevant past medical, surgical, family and social history reviewed and updated as indicated. Interim medical history since our last visit reviewed. Allergies and medications reviewed and updated. Outpatient Medications Prior to Visit  Medication Sig Dispense Refill   B Complex Vitamins (VITAMIN B  COMPLEX PO) Take 1 tablet by mouth daily.     bimatoprost (LUMIGAN) 0.01 % SOLN Place 1 drop into both eyes at bedtime.      carbidopa-levodopa (SINEMET IR) 25-100 MG tablet Take 1 tablet at 8am, 2 tablets at 12pm, 1 tablet at 4pm     cholecalciferol (VITAMIN D) 1000 units tablet Take 1,000 Units by mouth daily.      cycloSPORINE (RESTASIS) 0.05 % ophthalmic emulsion Place 1 drop into both eyes 2 (two) times daily.     desmopressin (DDAVP) 0.2 MG tablet Take 0.6 mg by mouth at bedtime.      docusate sodium (COLACE) 100 MG capsule Take 1 capsule (100 mg total) by mouth daily.     fenofibrate 160 MG tablet Take 1 tablet (160 mg total) by mouth daily. 90 tablet 3   linaclotide (LINZESS) 290 MCG CAPS capsule Take 1 capsule (290 mcg total) by mouth daily before breakfast. 90 capsule 3   lisinopril (ZESTRIL) 20 MG tablet Take 1 tablet (20 mg total) by mouth daily. 90 tablet 2   Multiple Vitamins-Minerals (MULTIVITAMIN ADULT PO) Take 1 tablet by mouth daily.     NON FORMULARY Take 2 drops by mouth daily. TINCTURE OF GARLIC     pantoprazole (PROTONIX) 40 MG tablet Take 1 tablet (40 mg total) by mouth daily. 90 tablet 3   rivastigmine (EXELON) 1.5 MG capsule Take 1.5 mg by mouth 2 (two) times daily.     simvastatin (ZOCOR) 40 MG tablet TAKE 1 TABLET DAILY AT 6 P.M. 90 tablet 3  amLODipine (NORVASC) 2.5 MG tablet Take 1 tablet (2.5 mg total) by mouth daily. 90 tablet 3   amLODipine (NORVASC) 2.5 MG tablet Take 2 tablets (5 mg total) by mouth daily.     carbidopa-levodopa (SINEMET CR) 50-200 MG tablet Take 1 tablet by mouth at bedtime.     No facility-administered medications prior to visit.     Per HPI unless specifically indicated in ROS section below Review of Systems  Objective:  BP (!) 164/66   Pulse 64   Temp 97.9 F (36.6 C) (Temporal)   Ht '5\' 10"'$  (1.778 m)   Wt 206 lb 6 oz (93.6 kg)   SpO2 97%   BMI 29.61 kg/m   Wt Readings from Last 3 Encounters:  01/05/21 206 lb 6 oz (93.6 kg)   12/30/20 200 lb (90.7 kg)  06/22/20 213 lb 2 oz (96.7 kg)      Physical Exam Vitals and nursing note reviewed.  Constitutional:      Appearance: Normal appearance. He is not ill-appearing.  Eyes:     Extraocular Movements: Extraocular movements intact.     Pupils: Pupils are equal, round, and reactive to light.  Neck:     Thyroid: No thyroid mass or thyromegaly.  Cardiovascular:     Rate and Rhythm: Normal rate and regular rhythm.     Pulses: Normal pulses.     Heart sounds: Normal heart sounds. No murmur heard. Pulmonary:     Effort: Pulmonary effort is normal. No respiratory distress.     Breath sounds: Normal breath sounds. No wheezing, rhonchi or rales.  Musculoskeletal:     Cervical back: Normal range of motion and neck supple. No rigidity.     Right lower leg: No edema.     Left lower leg: No edema.  Lymphadenopathy:     Cervical: No cervical adenopathy.  Skin:    General: Skin is warm and dry.     Findings: No rash.  Neurological:     Mental Status: He is alert.  Psychiatric:        Mood and Affect: Mood normal.        Behavior: Behavior normal.      Results for orders placed or performed during the hospital encounter of 12/30/20  Resp Panel by RT-PCR (Flu A&B, Covid) Nasopharyngeal Swab   Specimen: Nasopharyngeal Swab; Nasopharyngeal(NP) swabs in vial transport medium  Result Value Ref Range   SARS Coronavirus 2 by RT PCR NEGATIVE NEGATIVE   Influenza A by PCR NEGATIVE NEGATIVE   Influenza B by PCR NEGATIVE NEGATIVE  Basic metabolic panel  Result Value Ref Range   Sodium 145 135 - 145 mmol/L   Potassium 3.5 3.5 - 5.1 mmol/L   Chloride 108 98 - 111 mmol/L   CO2 28 22 - 32 mmol/L   Glucose, Bld 161 (H) 70 - 99 mg/dL   BUN 20 8 - 23 mg/dL   Creatinine, Ser 1.03 0.61 - 1.24 mg/dL   Calcium 10.1 8.9 - 10.3 mg/dL   GFR, Estimated >60 >60 mL/min   Anion gap 9 5 - 15  CBC  Result Value Ref Range   WBC 7.5 4.0 - 10.5 K/uL   RBC 4.55 4.22 - 5.81 MIL/uL    Hemoglobin 14.3 13.0 - 17.0 g/dL   HCT 41.5 39.0 - 52.0 %   MCV 91.2 80.0 - 100.0 fL   MCH 31.4 26.0 - 34.0 pg   MCHC 34.5 30.0 - 36.0 g/dL   RDW 13.0 11.5 -  15.5 %   Platelets 170 150 - 400 K/uL   nRBC 0.0 0.0 - 0.2 %  Troponin I (High Sensitivity)  Result Value Ref Range   Troponin I (High Sensitivity) 4 <18 ng/L  Troponin I (High Sensitivity)  Result Value Ref Range   Troponin I (High Sensitivity) 9 <18 ng/L    Assessment & Plan:  This visit occurred during the SARS-CoV-2 public health emergency.  Safety protocols were in place, including screening questions prior to the visit, additional usage of staff PPE, and extensive cleaning of exam room while observing appropriate contact time as indicated for disinfecting solutions.   Problem List Items Addressed This Visit     Essential hypertension - Primary    BP persistently elevated - will increase amlodipine to '5mg'$  nightly (as am BPs tend to run highest), continue lisinopril at '20mg'$  daily in am. Update [place] with readings over next 1-2 wks. He does have upcoming CPE scheduled in a few weeks - will reassess then.  Reviewed good hydration, limiting salt/sodium in diet.        Relevant Medications   amLODipine (NORVASC) 5 MG tablet   Parkinson disease ([place])    Closely followed by neurology Manuella Ghazi).  Recently started exelon daily with noted benefit. Doubt this is contributing to elevated BP readings.        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    Dispense:  30 tablet    Refill:  1    Note new sig    No orders of the defined types were placed in this encounter.   Patient Instructions  Increase amlodipine to '5mg'$ , take at night.  Continue lisinopril '20mg'$  daily in the morning.  Update [place] with readings in 10-14 days. Ok to take 3rd amlodipine (2.'5mg'$ ) if BP staying >180.  Continue good water intake, limiting salt/sodium in the diet.  Keep physical appointment later this month.   Follow up plan: Return if symptoms worsen or fail to improve.  Ria Bush, MD

## 2021-01-05 NOTE — Assessment & Plan Note (Signed)
BP persistently elevated - will increase amlodipine to '5mg'$  nightly (as am BPs tend to run highest), continue lisinopril at '20mg'$  daily in am. Update [place] with readings over next 1-2 wks. He does have upcoming CPE scheduled in a few weeks - will reassess then.  Reviewed good hydration, limiting salt/sodium in diet.

## 2021-01-05 NOTE — Assessment & Plan Note (Signed)
Concern for this - will proceed with antihypertensive titration cautiously

## 2021-01-05 NOTE — Patient Instructions (Signed)
Increase amlodipine to '5mg'$ , take at night. Continue lisinopril '20mg'$  daily in the morning.  Update [place] with readings in 10-14 days. Ok to take 3rd amlodipine (2.'5mg'$ ) if BP staying >180.  Continue good water intake, limiting salt/sodium in the diet.  Keep physical appointment later this month.

## 2021-01-05 NOTE — Assessment & Plan Note (Signed)
Closely followed by neurology Manuella Ghazi).  Recently started exelon daily with noted benefit. Doubt this is contributing to elevated BP readings.

## 2021-01-10 DIAGNOSIS — H401131 Primary open-angle glaucoma, bilateral, mild stage: Secondary | ICD-10-CM | POA: Diagnosis not present

## 2021-01-16 ENCOUNTER — Other Ambulatory Visit: Payer: Self-pay | Admitting: Family Medicine

## 2021-01-16 DIAGNOSIS — E785 Hyperlipidemia, unspecified: Secondary | ICD-10-CM

## 2021-01-16 DIAGNOSIS — R7303 Prediabetes: Secondary | ICD-10-CM

## 2021-01-18 ENCOUNTER — Other Ambulatory Visit (INDEPENDENT_AMBULATORY_CARE_PROVIDER_SITE_OTHER): Payer: Medicare Other

## 2021-01-18 ENCOUNTER — Ambulatory Visit: Payer: Medicare Other

## 2021-01-18 ENCOUNTER — Other Ambulatory Visit: Payer: Self-pay

## 2021-01-18 DIAGNOSIS — H401131 Primary open-angle glaucoma, bilateral, mild stage: Secondary | ICD-10-CM | POA: Diagnosis not present

## 2021-01-18 DIAGNOSIS — E785 Hyperlipidemia, unspecified: Secondary | ICD-10-CM | POA: Diagnosis not present

## 2021-01-18 DIAGNOSIS — R7303 Prediabetes: Secondary | ICD-10-CM | POA: Diagnosis not present

## 2021-01-18 LAB — LIPID PANEL
Cholesterol: 116 mg/dL (ref 0–200)
HDL: 42 mg/dL (ref >39.00)
LDL Cholesterol: 59 mg/dL (ref 0–99)
NonHDL: 74.21
Total CHOL/HDL Ratio: 3
Triglycerides: 77 mg/dL (ref 0.0–149.0)
VLDL: 15.4 mg/dL (ref 0.0–40.0)

## 2021-01-18 LAB — COMPREHENSIVE METABOLIC PANEL
ALT: 2 U/L (ref 0–53)
AST: 17 U/L (ref 0–37)
Albumin: 3.9 g/dL (ref 3.5–5.2)
Alkaline Phosphatase: 35 U/L — ABNORMAL LOW (ref 39–117)
BUN: 18 mg/dL (ref 6–23)
CO2: 31 mEq/L (ref 19–32)
Calcium: 10.3 mg/dL (ref 8.4–10.5)
Chloride: 107 mEq/L (ref 96–112)
Creatinine, Ser: 1.09 mg/dL (ref 0.40–1.50)
GFR: 62.35 mL/min (ref >60.00)
Glucose, Bld: 100 mg/dL — ABNORMAL HIGH (ref 70–99)
Potassium: 4.2 mEq/L (ref 3.5–5.1)
Sodium: 141 mEq/L (ref 135–145)
Total Bilirubin: 0.7 mg/dL (ref 0.2–1.2)
Total Protein: 6.4 g/dL (ref 6.0–8.3)

## 2021-01-18 LAB — HEMOGLOBIN A1C: Hgb A1c MFr Bld: 5.5 % (ref 4.6–6.5)

## 2021-01-26 ENCOUNTER — Other Ambulatory Visit: Payer: Self-pay

## 2021-01-26 ENCOUNTER — Encounter: Payer: Self-pay | Admitting: Family Medicine

## 2021-01-26 ENCOUNTER — Ambulatory Visit (INDEPENDENT_AMBULATORY_CARE_PROVIDER_SITE_OTHER): Payer: Medicare Other | Admitting: Family Medicine

## 2021-01-26 VITALS — BP 122/64 | HR 97 | Temp 98.0°F | Ht 70.0 in | Wt 206.0 lb

## 2021-01-26 DIAGNOSIS — I10 Essential (primary) hypertension: Secondary | ICD-10-CM | POA: Diagnosis not present

## 2021-01-26 DIAGNOSIS — Z0000 Encounter for general adult medical examination without abnormal findings: Secondary | ICD-10-CM

## 2021-01-26 DIAGNOSIS — N400 Enlarged prostate without lower urinary tract symptoms: Secondary | ICD-10-CM | POA: Diagnosis not present

## 2021-01-26 DIAGNOSIS — R7303 Prediabetes: Secondary | ICD-10-CM | POA: Diagnosis not present

## 2021-01-26 DIAGNOSIS — G20 Parkinson's disease: Secondary | ICD-10-CM | POA: Diagnosis not present

## 2021-01-26 DIAGNOSIS — N3281 Overactive bladder: Secondary | ICD-10-CM

## 2021-01-26 DIAGNOSIS — E663 Overweight: Secondary | ICD-10-CM

## 2021-01-26 DIAGNOSIS — K5909 Other constipation: Secondary | ICD-10-CM | POA: Diagnosis not present

## 2021-01-26 DIAGNOSIS — E785 Hyperlipidemia, unspecified: Secondary | ICD-10-CM | POA: Diagnosis not present

## 2021-01-26 DIAGNOSIS — G903 Multi-system degeneration of the autonomic nervous system: Secondary | ICD-10-CM

## 2021-01-26 MED ORDER — FENOFIBRATE 160 MG PO TABS: 160.0000 mg | ORAL_TABLET | Freq: Every day | ORAL | 3 refills | Status: DC

## 2021-01-26 MED ORDER — AMLODIPINE BESYLATE 5 MG PO TABS: 5.0000 mg | ORAL_TABLET | Freq: Every day | ORAL | 3 refills | Status: DC

## 2021-01-26 NOTE — Patient Instructions (Addendum)
If interested, check with pharmacy about new 2 shot shingles series (shingrix).  You are doing well today  Blood pressures are doing well.  Return as needed or in 6 months for follow up visit.   Health Maintenance After Age 84 After age 49, you are at a higher risk for certain long-term diseases and infections as well as injuries from falls. Falls are a major cause of broken bones and head injuries in people who are older than age 63. Getting regular preventive care can help to keep you healthy and well. Preventive care includes getting regular testing and making lifestyle changes as recommended by your health care provider. Talk with your health care provider about: Which screenings and tests you should have. A screening is a test that checks for a disease when you have no symptoms. A diet and exercise plan that is right for you. What should I know about screenings and tests to prevent falls? Screening and testing are the best ways to find a health problem early. Early diagnosis and treatment give you the best chance of managing medical conditions that are common after age 31. Certain conditions and lifestyle choices may make you more likely to have a fall. Your health care provider may recommend: Regular vision checks. Poor vision and conditions such as cataracts can make you more likely to have a fall. If you wear glasses, make sure to get your prescription updated if your vision changes. Medicine review. Work with your health care provider to regularly review all of the medicines you are taking, including over-the-counter medicines. Ask your health care provider about any side effects that may make you more likely to have a fall. Tell your health care provider if any medicines that you take make you feel dizzy or sleepy. Osteoporosis screening. Osteoporosis is a condition that causes the bones to get weaker. This can make the bones weak and cause them to break more easily. Blood pressure screening.  Blood pressure changes and medicines to control blood pressure can make you feel dizzy. Strength and balance checks. Your health care provider may recommend certain tests to check your strength and balance while standing, walking, or changing positions. Foot health exam. Foot pain and numbness, as well as not wearing proper footwear, can make you more likely to have a fall. Depression screening. You may be more likely to have a fall if you have a fear of falling, feel emotionally low, or feel unable to do activities that you used to do. Alcohol use screening. Using too much alcohol can affect your balance and may make you more likely to have a fall. What actions can I take to lower my risk of falls? General instructions Talk with your health care provider about your risks for falling. Tell your health care provider if: You fall. Be sure to tell your health care provider about all falls, even ones that seem minor. You feel dizzy, sleepy, or off-balance. Take over-the-counter and prescription medicines only as told by your health care provider. These include any supplements. Eat a healthy diet and maintain a healthy weight. A healthy diet includes low-fat dairy products, low-fat (lean) meats, and fiber from whole grains, beans, and lots of fruits and vegetables. Home safety Remove any tripping hazards, such as rugs, cords, and clutter. Install safety equipment such as grab bars in bathrooms and safety rails on stairs. Keep rooms and walkways well-lit. Activity  Follow a regular exercise program to stay fit. This will help you maintain your balance. Ask  your health care provider what types of exercise are appropriate for you. If you need a cane or walker, use it as recommended by your health care provider. Wear supportive shoes that have nonskid soles.  Lifestyle Do not drink alcohol if your health care provider tells you not to drink. If you drink alcohol, limit how much you have: 0-1 drink a  day for women. 0-2 drinks a day for men. Be aware of how much alcohol is in your drink. In the [place] one drink equals one typical bottle of beer (12 oz), one-half glass of wine (5 oz), or one shot of hard liquor (1 oz). Do not use any products that contain nicotine or tobacco, such as cigarettes and e-cigarettes. If you need help quitting, ask your health care provider. Summary Having a healthy lifestyle and getting preventive care can help to protect your health and wellness after age 67. Screening and testing are the best way to find a health problem early and help you avoid having a fall. Early diagnosis and treatment give you the best chance for managing medical conditions that are more common for people who are older than age 24. Falls are a major cause of broken bones and head injuries in people who are older than age 42. Take precautions to prevent a fall at home. Work with your health care provider to learn what changes you can make to improve your health and wellness and to prevent falls. This information is not intended to replace advice given to you by your health care provider. Make sure you discuss any questions you have with your healthcare provider. Document Revised: 05/07/2020 Document Reviewed: 05/07/2020 Elsevier Patient Education  2022 [employer].

## 2021-01-26 NOTE — Progress Notes (Signed)
Patient ID: Joshua Everett, male    DOB: January 06, 1935, 86 y.o.   MRN: [id number]  This visit was conducted in person.  BP 122/64   Pulse 97   Temp 98 F (36.7 C) (Temporal)   Ht '5\' 10"'$  (1.778 m)   Wt 206 lb (93.4 kg)   SpO2 97%   BMI 29.56 kg/m    CC: AMW Subjective:   HPI: Joshua Everett is a 86 y.o. male presenting on 01/26/2021 for Annual Exam   Did not see health advisor this year.   No results found.  [place] Office Visit from 01/26/2021 in [place] at [facility]  PHQ-2 Total Score 0       Fall Risk  01/26/2021 01/16/2020 01/13/2019 12/26/2017 12/20/2016  Falls in the past year? 0 0 0 No No  Number falls in past yr: 0 0 - - -  Injury with Fall? 0 0 - - -  Risk for fall due to : - Medication side effect - - -  Follow up - Falls evaluation completed;Falls prevention discussed - - -   PD followed by [facility] Dr Manuella Ghazi on sinemet + rivastigmine (excelon). H/o orthostatic hypotension. Noticing night time episodes of confusion and ?visual hallucinations - has conversations with deceased family members - more noticeable at night.   Recent fluctuating blood pressures currently on lisinopril '20mg'$  daily in am and amlodipine '5mg'$  nightly. Brings BP log 130-170/60-80s, HR 50-60. Sometimes with headaches - all seated blood pressures, none standing. Recent BP readings have been better controlled.   Overactive bladder has seen uro now off myrbetriq and flomax only on desmopressin. Joshua Everett was beneficial but not covered by insurance. Accidents every night. Vesicare stopped due to concern over confusion and hallucinations - with improvement noted. Uses 2 pads nightly.   Preventative: COLONOSCOPY Date: 2010 TAx1, rpt 5 yrs (Dr Acquanetta [facility] in [facility]) complicated by marked bradycardia s/p ER evaluation. iFOB negative 01/2017. Aged out.  Prostate cancer screening - aged out  Lung cancer screen - aged out  Declines flu shot. COVID vaccine - Pfizer 07/2019, 08/2019,  booster 04/2020 prevnar 03/2014, pneumovax 2009.  Tdap 2015 zostavax 04/2013 shingrix - discussed - to check with pharmacy.  Advanced directives - scanned 01/2016. No life prolonging measures if terminal irreversible condition. Separate HCPOA form filled out - [name] wife then Hau Hudecek are [place]. Scanned 02/2017. Seat belt use discussed.  Sunscreen use discussed. Sees dermatology (Dr Nicole Kindred).  Ex smoker quit 1970  Alcohol - seldom  Dentist - Q6 mo  Eye exam - yearly (glaucoma)  Bowel - constipation well managed on linzess  Bladder - ongoing incontinence (OAB) - see above. 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 Trouble with balance, was no longer fun.  Diet: good water, fruits/vegetables daily.      Relevant past medical, surgical, family and social history reviewed and updated as indicated. Interim medical history since our last visit reviewed. Allergies and medications reviewed and updated. Outpatient Medications Prior to Visit  Medication Sig Dispense Refill  . B Complex Vitamins (VITAMIN B COMPLEX PO) Take 1 tablet by mouth daily.    . bimatoprost (LUMIGAN) 0.01 % SOLN Place 1 drop into both eyes at bedtime.     . carbidopa-levodopa (SINEMET IR) 25-100 MG tablet Take 1 tablet at 8am, 2 tablets at 12pm, 1 tablet at 4pm    . cholecalciferol (VITAMIN D)  1000 units tablet Take 1,000 Units by mouth daily.     . cycloSPORINE (RESTASIS) 0.05 % ophthalmic emulsion Place 1 drop into both eyes 2 (two) times daily.    [name] Kitchen desmopressin (DDAVP) 0.2 MG tablet Take 0.6 mg by mouth at bedtime.     . docusate sodium (COLACE) 100 MG capsule Take 1 capsule (100 mg total) by mouth daily.    [name] Kitchen linaclotide (LINZESS) 290 MCG CAPS capsule Take 1 capsule (290 mcg total) by mouth daily before breakfast. 90 capsule 3  . lisinopril (ZESTRIL) 20 MG tablet Take 1 tablet (20 mg total) by mouth daily. 90 tablet 2  .  Multiple Vitamins-Minerals (MULTIVITAMIN ADULT PO) Take 1 tablet by mouth daily.    . NON FORMULARY Take 2 drops by mouth daily. TINCTURE OF GARLIC    . pantoprazole (PROTONIX) 40 MG tablet Take 1 tablet (40 mg total) by mouth daily. 90 tablet 3  . rivastigmine (EXELON) 1.5 MG capsule Take 1.5 mg by mouth 2 (two) times daily.    [name] Kitchen amLODipine (NORVASC) 5 MG tablet Take 1 tablet (5 mg total) by mouth at bedtime. 30 tablet 1  . fenofibrate 160 MG tablet Take 1 tablet (160 mg total) by mouth daily. 90 tablet 3  . simvastatin (ZOCOR) 40 MG tablet TAKE 1 TABLET DAILY AT 6 P.M. 90 tablet 3   No facility-administered medications prior to visit.     Per HPI unless specifically indicated in ROS section below Review of Systems  Objective:  BP 122/64   Pulse 97   Temp 98 F (36.7 C) (Temporal)   Ht '5\' 10"'$  (1.778 m)   Wt 206 lb (93.4 kg)   SpO2 97%   BMI 29.56 kg/m   Wt Readings from Last 3 Encounters:  01/26/21 206 lb (93.4 kg)  01/05/21 206 lb 6 oz (93.6 kg)  12/30/20 200 lb (90.7 kg)      Physical Exam Vitals and nursing note reviewed.  Constitutional:      General: He is not in acute distress.    Appearance: Normal appearance. He is well-developed. He is not ill-appearing.     Comments: Ambulates with walker  HENT:     Head: Normocephalic and atraumatic.     Right Ear: Hearing, tympanic membrane, ear canal and external ear normal.     Left Ear: Hearing, tympanic membrane, ear canal and external ear normal.  Eyes:     General: No scleral icterus.    Extraocular Movements: Extraocular movements intact.     Conjunctiva/sclera: Conjunctivae normal.     Pupils: Pupils are equal, round, and reactive to light.  Neck:     Thyroid: No thyroid mass or thyromegaly.     Vascular: No carotid bruit.  Cardiovascular:     Rate and Rhythm: Normal rate and regular rhythm.     Pulses: Normal pulses.          Radial pulses are 2+ on the right side and 2+ on the left side.     Heart sounds:  Normal heart sounds. No murmur heard. Pulmonary:     Effort: Pulmonary effort is normal. No respiratory distress.     Breath sounds: Normal breath sounds. No wheezing, rhonchi or rales.  Abdominal:     General: Bowel sounds are normal. There is no distension.     Palpations: Abdomen is soft. There is no mass.     Tenderness: There is no abdominal tenderness. There is no guarding or rebound.  Hernia: No hernia is present.  Musculoskeletal:        General: Normal range of motion.     Cervical back: Normal range of motion and neck supple.     Right lower leg: No edema.     Left lower leg: No edema.  Lymphadenopathy:     Cervical: No cervical adenopathy.  Skin:    General: Skin is warm and dry.     Findings: No rash.  Neurological:     General: No focal deficit present.     Mental Status: He is alert and oriented to person, place, and time.     Comments:  Recall 1/3, 2/3 with cue Calculation 1/5 DR  Resting tremor present  Psychiatric:        Mood and Affect: Mood normal.        Behavior: Behavior normal.        Thought Content: Thought content normal.        Judgment: Judgment normal.      Results for orders placed or performed in visit on 01/18/21  Hemoglobin A1c  Result Value Ref Range   Hgb A1c MFr Bld 5.5 4.6 - 6.5 %  Comprehensive metabolic panel  Result Value Ref Range   Sodium 141 135 - 145 mEq/L   Potassium 4.2 3.5 - 5.1 mEq/L   Chloride 107 96 - 112 mEq/L   CO2 31 19 - 32 mEq/L   Glucose, Bld 100 (H) 70 - 99 mg/dL   BUN 18 6 - 23 mg/dL   Creatinine, Ser 1.09 0.40 - 1.50 mg/dL   Total Bilirubin 0.7 0.2 - 1.2 mg/dL   Alkaline Phosphatase 35 (L) 39 - 117 U/L   AST 17 0 - 37 U/L   ALT 2 0 - 53 U/L   Total Protein 6.4 6.0 - 8.3 g/dL   Albumin 3.9 3.5 - 5.2 g/dL   GFR 62.35 >60.00 mL/min   Calcium 10.3 8.4 - 10.5 mg/dL  Lipid panel  Result Value Ref Range   Cholesterol 116 0 - 200 mg/dL   Triglycerides 77.0 0.0 - 149.0 mg/dL   HDL 42.00 >39.00 mg/dL    VLDL 15.4 0.0 - 40.0 mg/dL   LDL Cholesterol 59 0 - 99 mg/dL   Total CHOL/HDL Ratio 3    NonHDL 74.21     Assessment & Plan:  This visit occurred during the SARS-CoV-2 public health emergency.  Safety protocols were in place, including screening questions prior to the visit, additional usage of staff PPE, and extensive cleaning of exam room while observing appropriate contact time as indicated for disinfecting solutions.   Problem List Items Addressed This Visit     Medicare annual wellness visit, subsequent - Primary (Chronic)    I have personally reviewed the Medicare Annual Wellness questionnaire and have noted 1. The patient's medical and social history 2. Their use of alcohol, tobacco or illicit drugs 3. Their current medications and supplements 4. The patient's functional ability including ADL's, fall risks, home safety risks and hearing or visual impairment. Cognitive function has been assessed and addressed as indicated.  5. Diet and physical activity 6. Evidence for depression or mood disorders The patients weight, height, BMI have been recorded in the chart. I have made referrals, counseling and provided education to the patient based on review of the above and I have provided the pt with a written personalized care plan for preventive services. Provider list updated.. See scanned questionairre as needed for further documentation. Reviewed preventative protocols  and updated unless pt declined.       BPH without obstruction/lower urinary tract symptoms    Followed by urology on desmopressin, trying to find way to get [facility] covered as this was helpful.       Chronic constipation    Continue linzess use.       Dyslipidemia    Continue simvastatin, fibrate.  The ASCVD Risk score Mikey Bussing DC Jr., et al., 2013) failed to calculate for the following reasons:   The 2013 ASCVD risk score is only valid for ages 67 to 9       Relevant Medications   fenofibrate 160 MG tablet    simvastatin (ZOCOR) 40 MG tablet   Essential hypertension    Fluctuating blood pressures, more recently better controlled on current regimen of lisinopril '20mg'$  daily, amlodipine '5mg'$  daily. All readings have been while seated. In PD and orthostatic hypotension predisposition, hesitant to drop BP any lower - continue current regimen, suggested checking BP standing when sitting blood pressures trend up - may need to dose based on standing pressures.       Relevant Medications   amLODipine (NORVASC) 5 MG tablet   fenofibrate 160 MG tablet   simvastatin (ZOCOR) 40 MG tablet   Overweight (BMI 25.0-29.9)    Weight loss over the past year - will continue to mointor.       Prediabetes    a1c now in normal range with weight loss.       Overactive bladder    Continues urology f/u ([facility]) on desmopressin - Bryson Dames was also helpful but unaffordable. Avoiding antimuscarinics due to anticholinergic properties. myrbetriq was helpful but need caution with effect on hypertension.      Parkinson disease [facility])    Appreciate neurology care - now on sinemet+ rivastigmine which has helped with hallucination symptoms. They note possible ongoing hallucinations worse at night - have neuro f/u planned shortly.       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 amLODipine (NORVASC) 5 MG tablet    Sig: Take 1 tablet (5 mg total) by mouth at bedtime.    Dispense:  90 tablet    Refill:  3  . fenofibrate 160 MG tablet    Sig: Take 1 tablet (160 mg total) by mouth daily.    Dispense:  90 tablet    Refill:  3  . simvastatin (ZOCOR) 40 MG tablet    Sig: TAKE 1 TABLET DAILY AT 6 P.M.    Dispense:  90 tablet    Refill:  3    No orders of the defined types were placed in this encounter.   Patient instructions: If  interested, check with pharmacy about new 2 shot shingles series (shingrix).  You are doing well today  Blood pressures are doing well.  Return as needed or in 6 months for follow up visit.   Follow up plan: Return in about 6 months (around 07/29/2021) for follow up visit.  Ria Bush, MD

## 2021-01-27 ENCOUNTER — Encounter: Payer: Self-pay | Admitting: Family Medicine

## 2021-01-27 MED ORDER — SIMVASTATIN 40 MG PO TABS: ORAL_TABLET | ORAL | 3 refills | Status: DC

## 2021-01-27 NOTE — Assessment & Plan Note (Signed)
Weight loss over the past year - will continue to mointor.

## 2021-01-27 NOTE — Assessment & Plan Note (Signed)
Appreciate neurology care - now on sinemet+ rivastigmine which has helped with hallucination symptoms. They note possible ongoing hallucinations worse at night - have neuro f/u planned shortly.

## 2021-01-27 NOTE — Assessment & Plan Note (Signed)
Continues urology f/u ([facility]) on desmopressin - Joshua Everett was also helpful but unaffordable. Avoiding antimuscarinics due to anticholinergic properties. myrbetriq was helpful but need caution with effect on hypertension.

## 2021-01-27 NOTE — Assessment & Plan Note (Signed)
Continue linzess use.

## 2021-01-27 NOTE — Assessment & Plan Note (Signed)
Fluctuating blood pressures, more recently better controlled on current regimen of lisinopril '20mg'$  daily, amlodipine '5mg'$  daily. All readings have been while seated. In PD and orthostatic hypotension predisposition, hesitant to drop BP any lower - continue current regimen, suggested checking BP standing when sitting blood pressures trend up - may need to dose based on standing pressures.

## 2021-01-27 NOTE — Assessment & Plan Note (Signed)
Followed by urology on desmopressin, trying to find way to get [facility] covered as this was helpful.

## 2021-01-27 NOTE — Assessment & Plan Note (Signed)

## 2021-01-27 NOTE — Assessment & Plan Note (Signed)
Continue simvastatin, fibrate.  The ASCVD Risk score Mikey Bussing DC Jr., et al., 2013) failed to calculate for the following reasons:   The 2013 ASCVD risk score is only valid for ages 42 to 62

## 2021-01-27 NOTE — Assessment & Plan Note (Signed)
a1c now in normal range with weight loss.

## 2021-01-31 DIAGNOSIS — G20 Parkinson's disease: Secondary | ICD-10-CM | POA: Diagnosis not present

## 2021-01-31 DIAGNOSIS — G4752 REM sleep behavior disorder: Secondary | ICD-10-CM | POA: Diagnosis not present

## 2021-01-31 DIAGNOSIS — R6889 Other general symptoms and signs: Secondary | ICD-10-CM | POA: Diagnosis not present

## 2021-01-31 DIAGNOSIS — F0280 Dementia in other diseases classified elsewhere without behavioral disturbance: Secondary | ICD-10-CM | POA: Diagnosis not present

## 2021-01-31 DIAGNOSIS — R569 Unspecified convulsions: Secondary | ICD-10-CM | POA: Diagnosis not present

## 2021-01-31 DIAGNOSIS — R634 Abnormal weight loss: Secondary | ICD-10-CM | POA: Diagnosis not present

## 2021-02-16 DIAGNOSIS — G20 Parkinson's disease: Secondary | ICD-10-CM | POA: Diagnosis not present

## 2021-02-16 DIAGNOSIS — M810 Age-related osteoporosis without current pathological fracture: Secondary | ICD-10-CM | POA: Diagnosis not present

## 2021-02-16 DIAGNOSIS — Z79899 Other long term (current) drug therapy: Secondary | ICD-10-CM | POA: Diagnosis not present

## 2021-02-16 DIAGNOSIS — R569 Unspecified convulsions: Secondary | ICD-10-CM | POA: Diagnosis not present

## 2021-02-28 ENCOUNTER — Ambulatory Visit: Payer: Medicare Other

## 2021-03-07 DIAGNOSIS — N3941 Urge incontinence: Secondary | ICD-10-CM | POA: Diagnosis not present

## 2021-03-07 DIAGNOSIS — R351 Nocturia: Secondary | ICD-10-CM | POA: Diagnosis not present

## 2021-03-07 DIAGNOSIS — N401 Enlarged prostate with lower urinary tract symptoms: Secondary | ICD-10-CM | POA: Diagnosis not present

## 2021-03-07 DIAGNOSIS — F980 Enuresis not due to a substance or known physiological condition: Secondary | ICD-10-CM | POA: Diagnosis not present

## 2021-04-04 DIAGNOSIS — R569 Unspecified convulsions: Secondary | ICD-10-CM | POA: Diagnosis not present

## 2021-04-04 DIAGNOSIS — G4752 REM sleep behavior disorder: Secondary | ICD-10-CM | POA: Diagnosis not present

## 2021-04-04 DIAGNOSIS — R441 Visual hallucinations: Secondary | ICD-10-CM | POA: Diagnosis not present

## 2021-04-04 DIAGNOSIS — G20 Parkinson's disease: Secondary | ICD-10-CM | POA: Diagnosis not present

## 2021-04-04 DIAGNOSIS — F0280 Dementia in other diseases classified elsewhere without behavioral disturbance: Secondary | ICD-10-CM | POA: Diagnosis not present

## 2021-04-26 ENCOUNTER — Ambulatory Visit (INDEPENDENT_AMBULATORY_CARE_PROVIDER_SITE_OTHER): Payer: Medicare Other | Admitting: Dermatology

## 2021-04-26 ENCOUNTER — Other Ambulatory Visit: Payer: Self-pay

## 2021-04-26 DIAGNOSIS — L578 Other skin changes due to chronic exposure to nonionizing radiation: Secondary | ICD-10-CM

## 2021-04-26 DIAGNOSIS — D1800 Hemangioma unspecified site: Secondary | ICD-10-CM | POA: Diagnosis not present

## 2021-04-26 DIAGNOSIS — L821 Other seborrheic keratosis: Secondary | ICD-10-CM

## 2021-04-26 DIAGNOSIS — L853 Xerosis cutis: Secondary | ICD-10-CM

## 2021-04-26 DIAGNOSIS — D692 Other nonthrombocytopenic purpura: Secondary | ICD-10-CM | POA: Diagnosis not present

## 2021-04-26 DIAGNOSIS — Z85828 Personal history of other malignant neoplasm of skin: Secondary | ICD-10-CM

## 2021-04-26 DIAGNOSIS — L570 Actinic keratosis: Secondary | ICD-10-CM | POA: Diagnosis not present

## 2021-04-26 NOTE — Patient Instructions (Signed)
Gentle Skin Care Guide  1. Bathe no more than once a day.  2. Avoid bathing in hot water  3. Use a mild soap like Dove, Vanicream, Cetaphil, CeraVe. Can use Lever 2000 or Cetaphil antibacterial soap  4. Use soap only where you need it. On most days, use it under your arms, between your legs, and on your feet. Let the water rinse other areas unless visibly dirty.  5. When you get out of the bath/shower, use a towel to gently blot your skin dry, don't rub it.  6. While your skin is still a little damp, apply a moisturizing cream such as Vanicream, CeraVe, Cetaphil, Eucerin, Sarna lotion or plain Vaseline Jelly. For hands apply Neutrogena [place] Hand Cream or Excipial Hand Cream.  7. Reapply moisturizer any time you start to itch or feel dry.  8. Sometimes using free and clear laundry detergents can be helpful. Fabric softener sheets should be avoided. Downy Free & Gentle liquid, or any liquid fabric softener that is free of dyes and perfumes, it acceptable to use  9. If your doctor has given you prescription creams you may apply moisturizers over them

## 2021-04-26 NOTE — Progress Notes (Signed)
Follow-Up Visit   Subjective  Joshua Everett is a 86 y.o. male who presents for the following: Actinic Keratosis (L cheek, R forearm, L forearm, L temple - check for new or persistent skin lesions today.).   The following portions of the chart were reviewed this encounter and updated as appropriate:       Review of Systems:  No other skin or systemic complaints except as noted in HPI or Assessment and Plan.  Objective  Well appearing patient in no apparent distress; mood and affect are within normal limits.  A focused examination was performed including the face and hands. Relevant physical exam findings are noted in the Assessment and Plan.  R ear helix x 1, R preauricular x 1, L hand dorsum x 3, L temple x 2, R forearm x 1 (8) Erythematous thin papules/macules with gritty scale.    Assessment & Plan  AK (actinic keratosis) (8) R ear helix x 1, R preauricular x 1, L hand dorsum x 3, L temple x 2, R forearm x 1  Actinic keratoses are precancerous spots that appear secondary to cumulative UV radiation exposure/sun exposure over time. They are chronic with expected duration over 1 year. A portion of actinic keratoses will progress to squamous cell carcinoma of the skin. It is not possible to reliably predict which spots will progress to skin cancer and so treatment is recommended to prevent development of skin cancer.  Recommend daily broad spectrum sunscreen SPF 30+ to sun-exposed areas, reapply every 2 hours as needed.  Recommend staying in the shade or wearing long sleeves, sun glasses (UVA+UVB protection) and wide brim hats (4-inch brim around the entire circumference of the hat). Call for new or changing lesions.   Destruction of lesion - R ear helix x 1, R preauricular x 1, L hand dorsum x 3, L temple x 2, R forearm x 1  Destruction method: cryotherapy   Informed consent: discussed and consent obtained   Lesion destroyed using liquid nitrogen: Yes   Region frozen until  ice ball extended beyond lesion: Yes   Outcome: patient tolerated procedure well with no complications   Post-procedure details: wound care instructions given   Additional details:  Prior to procedure, discussed risks of blister formation, small wound, skin dyspigmentation, or rare scar following cryotherapy. Recommend Vaseline ointment to treated areas while healing.   Actinic Damage - chronic, secondary to cumulative UV radiation exposure/sun exposure over time - diffuse scaly erythematous macules with underlying dyspigmentation - Recommend daily broad spectrum sunscreen SPF 30+ to sun-exposed areas, reapply every 2 hours as needed.  - Recommend staying in the shade or wearing long sleeves, sun glasses (UVA+UVB protection) and wide brim hats (4-inch brim around the entire circumference of the hat). - Call for new or changing lesions.  Seborrheic Keratoses - Stuck-on, waxy, tan-brown papules and/or plaques  - Benign-appearing - Discussed benign etiology and prognosis. - Observe - Call for any changes  Hemangiomas - Red papules - Discussed benign nature - Observe - Call for any changes  Purpura - Chronic; persistent and recurrent.  Treatable, but not curable. - Violaceous macules and patches - Benign - Related to trauma, age, sun damage and/or use of blood thinners, chronic use of topical and/or oral steroids - Observe - Can use OTC arnica containing moisturizer such as Dermend Bruise Formula if desired - Call for worsening or other concerns  Xerosis - diffuse xerotic patches - recommend gentle, hydrating skin care - gentle skin care handout  given  History of Basal Cell Carcinoma of the Skin - No evidence of recurrence today nose - Recommend regular full body skin exams - Recommend daily broad spectrum sunscreen SPF 30+ to sun-exposed areas, reapply every 2 hours as needed.  - Call if any new or changing lesions are noted between office visits  Return in about 6 months  (around 10/24/2021) for UBSE.  Luther Redo, CMA, am acting as scribe for Brendolyn Patty, MD .  Documentation: I have reviewed the above documentation for accuracy and completeness, and I agree with the above.  Brendolyn Patty MD

## 2021-05-24 DIAGNOSIS — H524 Presbyopia: Secondary | ICD-10-CM | POA: Diagnosis not present

## 2021-05-24 DIAGNOSIS — H4010X1 Unspecified open-angle glaucoma, mild stage: Secondary | ICD-10-CM | POA: Diagnosis not present

## 2021-05-24 DIAGNOSIS — H25813 Combined forms of age-related cataract, bilateral: Secondary | ICD-10-CM | POA: Diagnosis not present

## 2021-06-24 DIAGNOSIS — R350 Frequency of micturition: Secondary | ICD-10-CM | POA: Diagnosis not present

## 2021-06-24 DIAGNOSIS — N3941 Urge incontinence: Secondary | ICD-10-CM | POA: Diagnosis not present

## 2021-06-24 DIAGNOSIS — F980 Enuresis not due to a substance or known physiological condition: Secondary | ICD-10-CM | POA: Diagnosis not present

## 2021-07-11 DIAGNOSIS — G20 Parkinson's disease: Secondary | ICD-10-CM | POA: Diagnosis not present

## 2021-07-11 DIAGNOSIS — R569 Unspecified convulsions: Secondary | ICD-10-CM | POA: Diagnosis not present

## 2021-07-11 DIAGNOSIS — R441 Visual hallucinations: Secondary | ICD-10-CM | POA: Diagnosis not present

## 2021-07-11 DIAGNOSIS — F05 Delirium due to known physiological condition: Secondary | ICD-10-CM | POA: Diagnosis not present

## 2021-07-11 DIAGNOSIS — F0280 Dementia in other diseases classified elsewhere without behavioral disturbance: Secondary | ICD-10-CM | POA: Diagnosis not present

## 2021-07-11 DIAGNOSIS — Z79899 Other long term (current) drug therapy: Secondary | ICD-10-CM | POA: Diagnosis not present

## 2021-07-19 DIAGNOSIS — H401131 Primary open-angle glaucoma, bilateral, mild stage: Secondary | ICD-10-CM | POA: Diagnosis not present

## 2021-07-29 ENCOUNTER — Ambulatory Visit: Payer: Medicare Other | Admitting: Family Medicine

## 2021-08-12 ENCOUNTER — Other Ambulatory Visit: Payer: Self-pay

## 2021-08-12 ENCOUNTER — Encounter: Payer: Self-pay | Admitting: Family Medicine

## 2021-08-12 ENCOUNTER — Ambulatory Visit (INDEPENDENT_AMBULATORY_CARE_PROVIDER_SITE_OTHER): Payer: Medicare Other | Admitting: Family Medicine

## 2021-08-12 VITALS — BP 118/62 | HR 62 | Temp 97.6°F | Ht 70.0 in | Wt 208.4 lb

## 2021-08-12 DIAGNOSIS — G20 Parkinson's disease: Secondary | ICD-10-CM | POA: Diagnosis not present

## 2021-08-12 DIAGNOSIS — G903 Multi-system degeneration of the autonomic nervous system: Secondary | ICD-10-CM

## 2021-08-12 DIAGNOSIS — E785 Hyperlipidemia, unspecified: Secondary | ICD-10-CM | POA: Diagnosis not present

## 2021-08-12 DIAGNOSIS — N3281 Overactive bladder: Secondary | ICD-10-CM

## 2021-08-12 DIAGNOSIS — K5909 Other constipation: Secondary | ICD-10-CM

## 2021-08-12 DIAGNOSIS — K219 Gastro-esophageal reflux disease without esophagitis: Secondary | ICD-10-CM

## 2021-08-12 DIAGNOSIS — I10 Essential (primary) hypertension: Secondary | ICD-10-CM

## 2021-08-12 DIAGNOSIS — R600 Localized edema: Secondary | ICD-10-CM

## 2021-08-12 MED ORDER — FIBER COMPLETE PO TABS: 2.0000 | ORAL_TABLET | Freq: Every day | ORAL | Status: DC

## 2021-08-12 NOTE — Assessment & Plan Note (Signed)
Longstanding issue, difficult to control. Has largely failed all medication trials, has seen incontinence specialist Dr Matilde Sprang, now off medications, only managed with voiding prior to getting in bed, pad use.  ?

## 2021-08-12 NOTE — Assessment & Plan Note (Signed)
Stable period on current regimen. Discussed option to decrease antihypertensive if this progresses.  ?

## 2021-08-12 NOTE — Assessment & Plan Note (Signed)
Mild. Discussed compression stocking use which they have at home. This may help orthostatic hypotension.  ?

## 2021-08-12 NOTE — Assessment & Plan Note (Signed)
Appreciate neurology care -sees regularly.  ?

## 2021-08-12 NOTE — Patient Instructions (Addendum)
Hold fenofibrate for next 5 moths, we will recheck cholesterol levels at your physical.  ?Consider compression stockings for noted leg swelling - this may help drops in blood pressure when standing.  ?Good to see you today ?Return as needed or in 5 months for wellness visit.  ?

## 2021-08-12 NOTE — Assessment & Plan Note (Addendum)
Chronic, stable period on current regimen - continue.  ?They deny orthostatic symptoms.  ?

## 2021-08-12 NOTE — Progress Notes (Signed)
Patient ID: Joshua Everett, male    DOB: 10-14-1934, 87 y.o.   MRN: [id number]  This visit was conducted in person.  BP 118/62    Pulse 62    Temp 97.6 F (36.4 C) (Temporal)    Ht '5\' 10"'$  (1.778 m)    Wt 208 lb 6 oz (94.5 kg)    SpO2 99%    BMI 29.90 kg/m    CC: 6 mo f/u visit  Subjective:   HPI: Joshua Everett is a 87 y.o. male presenting on 08/12/2021 for Follow-up (Here for 6 mo f/u.  Accompanied by wife, [name]. )   Recently celebrated 87yo birthday!  Not regularly taking B complex although he is regularly taking vitamin D, centrum silver MVI, and 2 fiber tablets daily in the mornings.  Constipation - managed with fiber, no longer taking linzess.   Known parkinson disease followed by neurology Dr Manuella Ghazi. On sinemet. Progression of dementia noted, continues rivastigmine ([place]) - previously with night time visual hallucinations. History of orthostatic hypotension. Given concern over seizure like activity 02/2021, lamictal 25/'50mg'$  bid was added. Melatonin for REM sleep behavior disorder. Imbalance with increased fall risk - he uses cane when out of the house and walker in the house. Has had several falls recently. Significant weight loss endorsed.   OAB - sees urology ([place]) on desmopressin - most recently this was stopped as well. Logan Bores was helpful but not covered by insurance. Having nightly accidents. Previously on myrbetriq, flomax, vesicare. Uses 2 pads nightly. No benefit with PTNS. Saw Dr Matilde Sprang - offered surgical intervention, declined.   HTN - on amlodipine '5mg'$  nightly and lisinopril '20mg'$  daily. Recommend dosing BP medications based on standing blood pressures. Denies low blood pressure symptoms.   Care team: Dermatology Dr Dereck Leep Neurology: Dr Queen Slough Urology: Dr Diona Fanti Ophthalmology: Dr Kerin Ransom     Relevant past medical, surgical, family and social history reviewed and updated as indicated. Interim medical history since our last visit  reviewed. Allergies and medications reviewed and updated. Outpatient Medications Prior to Visit  Medication Sig Dispense Refill   amLODipine (NORVASC) 5 MG tablet Take 1 tablet (5 mg total) by mouth at bedtime. 90 tablet 3   bimatoprost (LUMIGAN) 0.01 % SOLN Place 1 drop into both eyes at bedtime.      carbidopa-levodopa (SINEMET IR) 25-100 MG tablet Take 1 tablet at 8am, 2 tablets at 12pm, 1 tablet at 4pm     cholecalciferol (VITAMIN D) 1000 units tablet Take 1,000 Units by mouth daily.      cycloSPORINE (RESTASIS) 0.05 % ophthalmic emulsion Place 1 drop into both eyes 2 (two) times daily.     docusate sodium (COLACE) 100 MG capsule Take 1 capsule (100 mg total) by mouth daily.     lisinopril (ZESTRIL) 20 MG tablet Take 1 tablet (20 mg total) by mouth daily. 90 tablet 2   Multiple Vitamins-Minerals (MULTIVITAMIN ADULT PO) Take 1 tablet by mouth daily.     rivastigmine (EXELON) 1.5 MG capsule Take 1.5 mg by mouth 2 (two) times daily.     simvastatin (ZOCOR) 40 MG tablet TAKE 1 TABLET DAILY AT 6 P.M. 90 tablet 3   B Complex Vitamins (VITAMIN B COMPLEX PO) Take 1 tablet by mouth daily.     fenofibrate 160 MG tablet Take 1 tablet (160 mg total) by mouth daily. 90 tablet 3   linaclotide (LINZESS) 290 MCG CAPS capsule Take 1 capsule (290 mcg total) by mouth daily before breakfast.  90 capsule 3   NON FORMULARY Take 2 drops by mouth daily. TINCTURE OF GARLIC     pantoprazole (PROTONIX) 40 MG tablet Take 1 tablet (40 mg total) by mouth daily. 90 tablet 3   lamoTRIgine (LAMICTAL) 25 MG tablet Take 1 tablet (25 mg total) by mouth in the morning AND 1 tablet (25 mg total) at bedtime.     desmopressin (DDAVP) 0.2 MG tablet Take 0.6 mg by mouth at bedtime.      lamoTRIgine (LAMICTAL) 25 MG tablet Take 50 mg by mouth 2 (two) times daily.     No facility-administered medications prior to visit.     Per HPI unless specifically indicated in ROS section below Review of Systems  Objective:  BP 118/62     Pulse 62    Temp 97.6 F (36.4 C) (Temporal)    Ht '5\' 10"'$  (1.778 m)    Wt 208 lb 6 oz (94.5 kg)    SpO2 99%    BMI 29.90 kg/m   Wt Readings from Last 3 Encounters:  08/12/21 208 lb 6 oz (94.5 kg)  01/26/21 206 lb (93.4 kg)  01/05/21 206 lb 6 oz (93.6 kg)      Physical Exam Vitals and nursing note reviewed.  Constitutional:      Appearance: Normal appearance. He is not ill-appearing.     Comments: Ambulates with cane  Eyes:     Extraocular Movements: Extraocular movements intact.     Pupils: Pupils are equal, round, and reactive to light.  Cardiovascular:     Rate and Rhythm: Normal rate and regular rhythm.     Pulses: Normal pulses.     Heart sounds: Normal heart sounds. No murmur heard. Pulmonary:     Effort: Pulmonary effort is normal. No respiratory distress.     Breath sounds: Normal breath sounds. No wheezing, rhonchi or rales.  Musculoskeletal:     Right lower leg: Edema (tr) present.     Left lower leg: Edema (tr) present.  Skin:    Findings: No rash.  Neurological:     Mental Status: He is alert.     Comments: Slowed gait   Psychiatric:        Mood and Affect: Mood normal.        Behavior: Behavior normal.      Results for orders placed or performed in visit on 01/18/21  Hemoglobin A1c  Result Value Ref Range   Hgb A1c MFr Bld 5.5 4.6 - 6.5 %  Comprehensive metabolic panel  Result Value Ref Range   Sodium 141 135 - 145 mEq/L   Potassium 4.2 3.5 - 5.1 mEq/L   Chloride 107 96 - 112 mEq/L   CO2 31 19 - 32 mEq/L   Glucose, Bld 100 (H) 70 - 99 mg/dL   BUN 18 6 - 23 mg/dL   Creatinine, Ser 1.09 0.40 - 1.50 mg/dL   Total Bilirubin 0.7 0.2 - 1.2 mg/dL   Alkaline Phosphatase 35 (L) 39 - 117 U/L   AST 17 0 - 37 U/L   ALT 2 0 - 53 U/L   Total Protein 6.4 6.0 - 8.3 g/dL   Albumin 3.9 3.5 - 5.2 g/dL   GFR 62.35 >60.00 mL/min   Calcium 10.3 8.4 - 10.5 mg/dL  Lipid panel  Result Value Ref Range   Cholesterol 116 0 - 200 mg/dL   Triglycerides 77.0 0.0 - 149.0  mg/dL   HDL 42.00 >39.00 mg/dL   VLDL 15.4 0.0 - 40.0  mg/dL   LDL Cholesterol 59 0 - 99 mg/dL   Total CHOL/HDL Ratio 3    NonHDL 74.21    Assessment & Plan:  This visit occurred during the SARS-CoV-2 public health emergency.  Safety protocols were in place, including screening questions prior to the visit, additional usage of staff PPE, and extensive cleaning of exam room while observing appropriate contact time as indicated for disinfecting solutions.   Problem List Items Addressed This Visit     Chronic constipation    He has not been taking linzess.  Discussed fiber intake - tablets vs gummies vs soluble fiber powder supplements.       Relevant Medications   Fiber Complete TABS   Dyslipidemia    Long-term on simvastatin and fenofibrate. Latest FLP 01/2021 with Trig 77, LDL 59. They note weight loss since then. Will stop fenofibrate and reassess FLP at CPE this summer.       GERD (gastroesophageal reflux disease)    No reflux symptoms.  Has been off PPI for a while, stable period.       Relevant Medications   Fiber Complete TABS   Essential hypertension    Chronic, stable period on current regimen - continue.  They deny orthostatic symptoms.       Overactive bladder    Longstanding issue, difficult to control. Has largely failed all medication trials, has seen incontinence specialist Dr Matilde Sprang, now off medications, only managed with voiding prior to getting in bed, pad use.       Parkinson disease ([place]) - Primary    Appreciate neurology care -sees regularly.       Relevant Medications   lamoTRIgine (LAMICTAL) 25 MG tablet   Pedal edema    Mild. Discussed compression stocking use which they have at home. This may help orthostatic hypotension.       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[place])    Stable period on current regimen. Discussed option to decrease antihypertensive if this progresses.       Relevant Medications   lamoTRIgine (LAMICTAL) 25  MG tablet     Meds ordered this encounter  Medications   Fiber Complete TABS    Sig: Take 2 tablets by mouth daily.   No orders of the defined types were placed in this encounter.    Patient Instructions  Hold fenofibrate for next 5 moths, we will recheck cholesterol levels at your physical.  Consider compression stockings for noted leg swelling - this may help drops in blood pressure when standing.  Good to see you today Return as needed or in 5 months for wellness visit.    Follow up plan: Return for medicare wellness visit.  Ria Bush, MD

## 2021-08-12 NOTE — Assessment & Plan Note (Addendum)
No reflux symptoms.  ?Has been off PPI for a while, stable period.  ?

## 2021-08-12 NOTE — Assessment & Plan Note (Signed)
Long-term on simvastatin and fenofibrate. Latest FLP 01/2021 with Trig 77, LDL 59. They note weight loss since then. Will stop fenofibrate and reassess FLP at CPE this summer.  ?

## 2021-08-12 NOTE — Assessment & Plan Note (Signed)
He has not been taking linzess.  ?Discussed fiber intake - tablets vs gummies vs soluble fiber powder supplements.  ?

## 2021-08-26 DIAGNOSIS — R634 Abnormal weight loss: Secondary | ICD-10-CM | POA: Diagnosis not present

## 2021-08-26 DIAGNOSIS — R32 Unspecified urinary incontinence: Secondary | ICD-10-CM | POA: Diagnosis not present

## 2021-08-26 DIAGNOSIS — R569 Unspecified convulsions: Secondary | ICD-10-CM | POA: Diagnosis not present

## 2021-08-26 DIAGNOSIS — R296 Repeated falls: Secondary | ICD-10-CM | POA: Diagnosis not present

## 2021-08-26 DIAGNOSIS — G4752 REM sleep behavior disorder: Secondary | ICD-10-CM | POA: Diagnosis not present

## 2021-08-26 DIAGNOSIS — G20 Parkinson's disease: Secondary | ICD-10-CM | POA: Diagnosis not present

## 2021-08-26 DIAGNOSIS — Z79899 Other long term (current) drug therapy: Secondary | ICD-10-CM | POA: Diagnosis not present

## 2021-08-26 DIAGNOSIS — F0280 Dementia in other diseases classified elsewhere without behavioral disturbance: Secondary | ICD-10-CM | POA: Diagnosis not present

## 2021-09-02 DIAGNOSIS — H401131 Primary open-angle glaucoma, bilateral, mild stage: Secondary | ICD-10-CM | POA: Diagnosis not present

## 2021-09-04 DIAGNOSIS — R634 Abnormal weight loss: Secondary | ICD-10-CM | POA: Diagnosis not present

## 2021-09-04 DIAGNOSIS — K5900 Constipation, unspecified: Secondary | ICD-10-CM | POA: Diagnosis not present

## 2021-09-04 DIAGNOSIS — R32 Unspecified urinary incontinence: Secondary | ICD-10-CM | POA: Diagnosis not present

## 2021-09-04 DIAGNOSIS — F02818 Dementia in other diseases classified elsewhere, unspecified severity, with other behavioral disturbance: Secondary | ICD-10-CM | POA: Diagnosis not present

## 2021-09-04 DIAGNOSIS — K219 Gastro-esophageal reflux disease without esophagitis: Secondary | ICD-10-CM | POA: Diagnosis not present

## 2021-09-04 DIAGNOSIS — F0282 Dementia in other diseases classified elsewhere, unspecified severity, with psychotic disturbance: Secondary | ICD-10-CM | POA: Diagnosis not present

## 2021-09-04 DIAGNOSIS — Z6828 Body mass index (BMI) 28.0-28.9, adult: Secondary | ICD-10-CM | POA: Diagnosis not present

## 2021-09-04 DIAGNOSIS — H409 Unspecified glaucoma: Secondary | ICD-10-CM | POA: Diagnosis not present

## 2021-09-04 DIAGNOSIS — R7303 Prediabetes: Secondary | ICD-10-CM | POA: Diagnosis not present

## 2021-09-04 DIAGNOSIS — I10 Essential (primary) hypertension: Secondary | ICD-10-CM | POA: Diagnosis not present

## 2021-09-04 DIAGNOSIS — M1990 Unspecified osteoarthritis, unspecified site: Secondary | ICD-10-CM | POA: Diagnosis not present

## 2021-09-04 DIAGNOSIS — G4752 REM sleep behavior disorder: Secondary | ICD-10-CM | POA: Diagnosis not present

## 2021-09-04 DIAGNOSIS — E785 Hyperlipidemia, unspecified: Secondary | ICD-10-CM | POA: Diagnosis not present

## 2021-09-04 DIAGNOSIS — R296 Repeated falls: Secondary | ICD-10-CM | POA: Diagnosis not present

## 2021-09-04 DIAGNOSIS — G20 Parkinson's disease: Secondary | ICD-10-CM | POA: Diagnosis not present

## 2021-09-04 DIAGNOSIS — N3281 Overactive bladder: Secondary | ICD-10-CM | POA: Diagnosis not present

## 2021-09-04 DIAGNOSIS — Z87891 Personal history of nicotine dependence: Secondary | ICD-10-CM | POA: Diagnosis not present

## 2021-09-07 DIAGNOSIS — R296 Repeated falls: Secondary | ICD-10-CM | POA: Diagnosis not present

## 2021-09-07 DIAGNOSIS — G20 Parkinson's disease: Secondary | ICD-10-CM | POA: Diagnosis not present

## 2021-09-13 DIAGNOSIS — R296 Repeated falls: Secondary | ICD-10-CM | POA: Diagnosis not present

## 2021-09-13 DIAGNOSIS — G20 Parkinson's disease: Secondary | ICD-10-CM | POA: Diagnosis not present

## 2021-09-14 DIAGNOSIS — G20 Parkinson's disease: Secondary | ICD-10-CM | POA: Diagnosis not present

## 2021-09-14 DIAGNOSIS — R296 Repeated falls: Secondary | ICD-10-CM | POA: Diagnosis not present

## 2021-09-14 DIAGNOSIS — M1990 Unspecified osteoarthritis, unspecified site: Secondary | ICD-10-CM | POA: Diagnosis not present

## 2021-09-14 DIAGNOSIS — I10 Essential (primary) hypertension: Secondary | ICD-10-CM | POA: Diagnosis not present

## 2021-09-14 DIAGNOSIS — F0282 Dementia in other diseases classified elsewhere, unspecified severity, with psychotic disturbance: Secondary | ICD-10-CM | POA: Diagnosis not present

## 2021-09-14 DIAGNOSIS — F02818 Dementia in other diseases classified elsewhere, unspecified severity, with other behavioral disturbance: Secondary | ICD-10-CM | POA: Diagnosis not present

## 2021-09-17 DIAGNOSIS — G20 Parkinson's disease: Secondary | ICD-10-CM | POA: Diagnosis not present

## 2021-09-17 DIAGNOSIS — F02818 Dementia in other diseases classified elsewhere, unspecified severity, with other behavioral disturbance: Secondary | ICD-10-CM | POA: Diagnosis not present

## 2021-09-17 DIAGNOSIS — R296 Repeated falls: Secondary | ICD-10-CM | POA: Diagnosis not present

## 2021-09-17 DIAGNOSIS — M1990 Unspecified osteoarthritis, unspecified site: Secondary | ICD-10-CM | POA: Diagnosis not present

## 2021-09-17 DIAGNOSIS — I10 Essential (primary) hypertension: Secondary | ICD-10-CM | POA: Diagnosis not present

## 2021-09-17 DIAGNOSIS — F0282 Dementia in other diseases classified elsewhere, unspecified severity, with psychotic disturbance: Secondary | ICD-10-CM | POA: Diagnosis not present

## 2021-09-22 DIAGNOSIS — M1990 Unspecified osteoarthritis, unspecified site: Secondary | ICD-10-CM | POA: Diagnosis not present

## 2021-09-22 DIAGNOSIS — I10 Essential (primary) hypertension: Secondary | ICD-10-CM | POA: Diagnosis not present

## 2021-09-22 DIAGNOSIS — G20 Parkinson's disease: Secondary | ICD-10-CM | POA: Diagnosis not present

## 2021-09-22 DIAGNOSIS — F02818 Dementia in other diseases classified elsewhere, unspecified severity, with other behavioral disturbance: Secondary | ICD-10-CM | POA: Diagnosis not present

## 2021-09-22 DIAGNOSIS — F0282 Dementia in other diseases classified elsewhere, unspecified severity, with psychotic disturbance: Secondary | ICD-10-CM | POA: Diagnosis not present

## 2021-09-22 DIAGNOSIS — R296 Repeated falls: Secondary | ICD-10-CM | POA: Diagnosis not present

## 2021-09-27 ENCOUNTER — Telehealth: Payer: Self-pay

## 2021-09-27 DIAGNOSIS — F02818 Dementia in other diseases classified elsewhere, unspecified severity, with other behavioral disturbance: Secondary | ICD-10-CM | POA: Diagnosis not present

## 2021-09-27 DIAGNOSIS — R296 Repeated falls: Secondary | ICD-10-CM | POA: Diagnosis not present

## 2021-09-27 DIAGNOSIS — G20 Parkinson's disease: Secondary | ICD-10-CM | POA: Diagnosis not present

## 2021-09-27 DIAGNOSIS — M1990 Unspecified osteoarthritis, unspecified site: Secondary | ICD-10-CM | POA: Diagnosis not present

## 2021-09-27 DIAGNOSIS — F0282 Dementia in other diseases classified elsewhere, unspecified severity, with psychotic disturbance: Secondary | ICD-10-CM | POA: Diagnosis not present

## 2021-09-27 DIAGNOSIS — I10 Essential (primary) hypertension: Secondary | ICD-10-CM | POA: Diagnosis not present

## 2021-09-27 NOTE — Telephone Encounter (Signed)
Hillary Bow, PT assistant with [facility], called to let us know that patient fell last evening fixing his bed just lost his balance. He has some bruising but no bleeding or open wounds. He did not hit his head and did not pass out. Patient is not in distress at this time. Per their protocol they needed to advise PCP of this . If any questions [facility] [phone] ?

## 2021-09-27 NOTE — Telephone Encounter (Signed)
Noted. Thanks.

## 2021-09-30 DIAGNOSIS — Z79899 Other long term (current) drug therapy: Secondary | ICD-10-CM | POA: Diagnosis not present

## 2021-09-30 DIAGNOSIS — G20 Parkinson's disease: Secondary | ICD-10-CM | POA: Diagnosis not present

## 2021-10-04 DIAGNOSIS — R634 Abnormal weight loss: Secondary | ICD-10-CM | POA: Diagnosis not present

## 2021-10-04 DIAGNOSIS — G4752 REM sleep behavior disorder: Secondary | ICD-10-CM | POA: Diagnosis not present

## 2021-10-04 DIAGNOSIS — Z87891 Personal history of nicotine dependence: Secondary | ICD-10-CM | POA: Diagnosis not present

## 2021-10-04 DIAGNOSIS — R296 Repeated falls: Secondary | ICD-10-CM | POA: Diagnosis not present

## 2021-10-04 DIAGNOSIS — E785 Hyperlipidemia, unspecified: Secondary | ICD-10-CM | POA: Diagnosis not present

## 2021-10-04 DIAGNOSIS — K5900 Constipation, unspecified: Secondary | ICD-10-CM | POA: Diagnosis not present

## 2021-10-04 DIAGNOSIS — R32 Unspecified urinary incontinence: Secondary | ICD-10-CM | POA: Diagnosis not present

## 2021-10-04 DIAGNOSIS — G20 Parkinson's disease: Secondary | ICD-10-CM | POA: Diagnosis not present

## 2021-10-04 DIAGNOSIS — F0282 Dementia in other diseases classified elsewhere, unspecified severity, with psychotic disturbance: Secondary | ICD-10-CM | POA: Diagnosis not present

## 2021-10-04 DIAGNOSIS — I10 Essential (primary) hypertension: Secondary | ICD-10-CM | POA: Diagnosis not present

## 2021-10-04 DIAGNOSIS — R7303 Prediabetes: Secondary | ICD-10-CM | POA: Diagnosis not present

## 2021-10-04 DIAGNOSIS — M1990 Unspecified osteoarthritis, unspecified site: Secondary | ICD-10-CM | POA: Diagnosis not present

## 2021-10-04 DIAGNOSIS — H409 Unspecified glaucoma: Secondary | ICD-10-CM | POA: Diagnosis not present

## 2021-10-04 DIAGNOSIS — N3281 Overactive bladder: Secondary | ICD-10-CM | POA: Diagnosis not present

## 2021-10-04 DIAGNOSIS — Z6828 Body mass index (BMI) 28.0-28.9, adult: Secondary | ICD-10-CM | POA: Diagnosis not present

## 2021-10-04 DIAGNOSIS — K219 Gastro-esophageal reflux disease without esophagitis: Secondary | ICD-10-CM | POA: Diagnosis not present

## 2021-10-04 DIAGNOSIS — F02818 Dementia in other diseases classified elsewhere, unspecified severity, with other behavioral disturbance: Secondary | ICD-10-CM | POA: Diagnosis not present

## 2021-10-05 DIAGNOSIS — F02818 Dementia in other diseases classified elsewhere, unspecified severity, with other behavioral disturbance: Secondary | ICD-10-CM | POA: Diagnosis not present

## 2021-10-05 DIAGNOSIS — F0282 Dementia in other diseases classified elsewhere, unspecified severity, with psychotic disturbance: Secondary | ICD-10-CM | POA: Diagnosis not present

## 2021-10-05 DIAGNOSIS — G20 Parkinson's disease: Secondary | ICD-10-CM | POA: Diagnosis not present

## 2021-10-05 DIAGNOSIS — R296 Repeated falls: Secondary | ICD-10-CM | POA: Diagnosis not present

## 2021-10-05 DIAGNOSIS — M1990 Unspecified osteoarthritis, unspecified site: Secondary | ICD-10-CM | POA: Diagnosis not present

## 2021-10-05 DIAGNOSIS — I10 Essential (primary) hypertension: Secondary | ICD-10-CM | POA: Diagnosis not present

## 2021-10-12 DIAGNOSIS — R296 Repeated falls: Secondary | ICD-10-CM | POA: Diagnosis not present

## 2021-10-12 DIAGNOSIS — M1990 Unspecified osteoarthritis, unspecified site: Secondary | ICD-10-CM | POA: Diagnosis not present

## 2021-10-12 DIAGNOSIS — I10 Essential (primary) hypertension: Secondary | ICD-10-CM | POA: Diagnosis not present

## 2021-10-12 DIAGNOSIS — G20 Parkinson's disease: Secondary | ICD-10-CM | POA: Diagnosis not present

## 2021-10-12 DIAGNOSIS — F02818 Dementia in other diseases classified elsewhere, unspecified severity, with other behavioral disturbance: Secondary | ICD-10-CM | POA: Diagnosis not present

## 2021-10-12 DIAGNOSIS — F0282 Dementia in other diseases classified elsewhere, unspecified severity, with psychotic disturbance: Secondary | ICD-10-CM | POA: Diagnosis not present

## 2021-10-19 DIAGNOSIS — G20 Parkinson's disease: Secondary | ICD-10-CM | POA: Diagnosis not present

## 2021-10-19 DIAGNOSIS — F0282 Dementia in other diseases classified elsewhere, unspecified severity, with psychotic disturbance: Secondary | ICD-10-CM | POA: Diagnosis not present

## 2021-10-19 DIAGNOSIS — I10 Essential (primary) hypertension: Secondary | ICD-10-CM | POA: Diagnosis not present

## 2021-10-19 DIAGNOSIS — R296 Repeated falls: Secondary | ICD-10-CM | POA: Diagnosis not present

## 2021-10-19 DIAGNOSIS — M1990 Unspecified osteoarthritis, unspecified site: Secondary | ICD-10-CM | POA: Diagnosis not present

## 2021-10-19 DIAGNOSIS — F02818 Dementia in other diseases classified elsewhere, unspecified severity, with other behavioral disturbance: Secondary | ICD-10-CM | POA: Diagnosis not present

## 2021-10-27 DIAGNOSIS — F0282 Dementia in other diseases classified elsewhere, unspecified severity, with psychotic disturbance: Secondary | ICD-10-CM | POA: Diagnosis not present

## 2021-10-27 DIAGNOSIS — R296 Repeated falls: Secondary | ICD-10-CM | POA: Diagnosis not present

## 2021-10-27 DIAGNOSIS — G20 Parkinson's disease: Secondary | ICD-10-CM | POA: Diagnosis not present

## 2021-10-27 DIAGNOSIS — I10 Essential (primary) hypertension: Secondary | ICD-10-CM | POA: Diagnosis not present

## 2021-10-27 DIAGNOSIS — F02818 Dementia in other diseases classified elsewhere, unspecified severity, with other behavioral disturbance: Secondary | ICD-10-CM | POA: Diagnosis not present

## 2021-10-27 DIAGNOSIS — M1990 Unspecified osteoarthritis, unspecified site: Secondary | ICD-10-CM | POA: Diagnosis not present

## 2021-11-01 ENCOUNTER — Ambulatory Visit (INDEPENDENT_AMBULATORY_CARE_PROVIDER_SITE_OTHER): Payer: Medicare Other | Admitting: Dermatology

## 2021-11-01 DIAGNOSIS — L720 Epidermal cyst: Secondary | ICD-10-CM | POA: Diagnosis not present

## 2021-11-01 DIAGNOSIS — D1800 Hemangioma unspecified site: Secondary | ICD-10-CM

## 2021-11-01 DIAGNOSIS — D692 Other nonthrombocytopenic purpura: Secondary | ICD-10-CM | POA: Diagnosis not present

## 2021-11-01 DIAGNOSIS — L821 Other seborrheic keratosis: Secondary | ICD-10-CM | POA: Diagnosis not present

## 2021-11-01 DIAGNOSIS — D2239 Melanocytic nevi of other parts of face: Secondary | ICD-10-CM

## 2021-11-01 DIAGNOSIS — L570 Actinic keratosis: Secondary | ICD-10-CM

## 2021-11-01 DIAGNOSIS — D229 Melanocytic nevi, unspecified: Secondary | ICD-10-CM

## 2021-11-01 DIAGNOSIS — Z85828 Personal history of other malignant neoplasm of skin: Secondary | ICD-10-CM | POA: Diagnosis not present

## 2021-11-01 DIAGNOSIS — L853 Xerosis cutis: Secondary | ICD-10-CM | POA: Diagnosis not present

## 2021-11-01 DIAGNOSIS — L578 Other skin changes due to chronic exposure to nonionizing radiation: Secondary | ICD-10-CM

## 2021-11-01 DIAGNOSIS — Z1283 Encounter for screening for malignant neoplasm of skin: Secondary | ICD-10-CM

## 2021-11-01 DIAGNOSIS — L820 Inflamed seborrheic keratosis: Secondary | ICD-10-CM | POA: Diagnosis not present

## 2021-11-01 NOTE — Progress Notes (Signed)
Follow-Up Visit   Subjective  Joshua Everett is a 87 y.o. male who presents for the following: Upper body skin exam (Hx of Aks, hx of BCCs, SCCs).  The patient presents for Upper Body Skin Exam (UBSE) for skin cancer screening and mole check.  The patient has spots, moles and lesions to be evaluated, some may be new or changing and the patient has concerns that these could be cancer.   The following portions of the chart were reviewed this encounter and updated as appropriate:       Review of Systems:  No other skin or systemic complaints except as noted in HPI or Assessment and Plan.  Objective  Well appearing patient in no apparent distress; mood and affect are within normal limits.  All skin waist up examined.  R nasal tip x 1, nasal dorusm x 1, L temple x 1, L lat cheek x 2, R hand dorsum x 5 (10) Pink scaly macules  L chin 6.35m flesh lobulated pap L chin (lateral)     Right Upper Back Firm SQ nodule  Right Shoulder - Anterior x 1 Stuck on waxy papule with erythema    Assessment & Plan   Seborrheic Keratoses - Stuck-on, waxy, tan-brown papules and/or plaques  - Benign-appearing - Discussed benign etiology and prognosis. - Observe - Call for any changes - arms  Melanocytic Nevi - Tan-brown and/or pink-flesh-colored symmetric macules and papules - Benign appearing on exam today - Observation - Call clinic for new or changing moles - Recommend daily use of broad spectrum spf 30+ sunscreen to sun-exposed areas. - back   Hemangiomas - Red papules - Discussed benign nature - Observe - Call for any changes - back  Actinic Damage - Chronic condition, secondary to cumulative UV/sun exposure - diffuse scaly erythematous macules with underlying dyspigmentation - Recommend daily broad spectrum sunscreen SPF 30+ to sun-exposed areas, reapply every 2 hours as needed.  - Staying in the shade or wearing long sleeves, sun glasses (UVA+UVB protection) and  wide brim hats (4-inch brim around the entire circumference of the hat) are also recommended for sun protection.  - Call for new or changing lesions.  Skin cancer screening performed today.  AK (actinic keratosis) (10) R nasal tip x 1, nasal dorusm x 1, L temple x 1, L lat cheek x 2, R hand dorsum x 5  Destruction of lesion - R nasal tip x 1, nasal dorusm x 1, L temple x 1, L lat cheek x 2, R hand dorsum x 5  Destruction method: cryotherapy   Informed consent: discussed and consent obtained   Lesion destroyed using liquid nitrogen: Yes   Region frozen until ice ball extended beyond lesion: Yes   Outcome: patient tolerated procedure well with no complications   Post-procedure details: wound care instructions given   Additional details:  Prior to procedure, discussed risks of blister formation, small wound, skin dyspigmentation, or rare scar following cryotherapy. Recommend Vaseline ointment to treated areas while healing.   Nevus L chin  Benign-appearing.  Observation.  Call clinic for new or changing lesions.  Recommend daily use of broad spectrum spf 30+ sunscreen to sun-exposed areas.    Pts wife says mole has been there for years, no changes  Recheck on f/up  Epidermal cyst Right Upper Back  Benign-appearing. Exam most consistent with an epidermal inclusion cyst. Discussed that a cyst is a benign growth that can grow over time and sometimes get irritated or inflamed. Recommend observation  if it is not bothersome. Discussed option of surgical excision to remove it if it is growing, symptomatic, or other changes noted. Please call for new or changing lesions so they can be evaluated.    Inflamed seborrheic keratosis Right Shoulder - Anterior x 1  Symptomatic, irritating, patient would like treated.   Destruction of lesion - Right Shoulder - Anterior x 1  Destruction method: cryotherapy   Informed consent: discussed and consent obtained   Lesion destroyed using liquid  nitrogen: Yes   Region frozen until ice ball extended beyond lesion: Yes   Outcome: patient tolerated procedure well with no complications   Post-procedure details: wound care instructions given   Additional details:  Prior to procedure, discussed risks of blister formation, small wound, skin dyspigmentation, or rare scar following cryotherapy. Recommend Vaseline ointment to treated areas while healing.    History of Basal Cell Carcinoma of the Skin - No evidence of recurrence today - Recommend regular full body skin exams - Recommend daily broad spectrum sunscreen SPF 30+ to sun-exposed areas, reapply every 2 hours as needed.  - Call if any new or changing lesions are noted between office visits  - multiple  History of Squamous Cell Carcinoma of the Skin - No evidence of recurrence today - No lymphadenopathy - Recommend regular full body skin exams - Recommend daily broad spectrum sunscreen SPF 30+ to sun-exposed areas, reapply every 2 hours as needed.  - Call if any new or changing lesions are noted between office visits - multiple  Purpura - Chronic; persistent and recurrent.  Treatable, but not curable. - Violaceous macules and patches - Benign - Related to trauma, age, sun damage and/or use of blood thinners, chronic use of topical and/or oral steroids - Observe - Can use OTC arnica containing moisturizer such as Dermend Bruise Formula if desired - Call for worsening or other concerns  Xerosis - diffuse xerotic patches - recommend gentle, hydrating skin care - gentle skin care handout given -Recommend starting moisturizer with exfoliant (Urea, Salicylic acid, or Lactic acid) one to two times daily to help smooth rough and bumpy skin.  OTC options include Cetaphil Rough and Bumpy lotion (Urea), Eucerin Roughness Relief lotion or spot treatment cream (Urea), CeraVe SA lotion/cream for Rough and Bumpy skin (Sal Acid), Gold Bond Rough and Bumpy cream (Sal Acid), and AmLactin 12%  lotion/cream (Lactic Acid).  If applying in morning, also apply sunscreen to sun-exposed areas, since these exfoliating moisturizers can increase sensitivity to sun.     Return in about 6 months (around 05/04/2022) for ak/ISK f/u.  I, Othelia Pulling, RMA, am acting as scribe for Brendolyn Patty, MD .  Documentation: I have reviewed the above documentation for accuracy and completeness, and I agree with the above.  Brendolyn Patty MD

## 2021-11-01 NOTE — Patient Instructions (Addendum)
Cryotherapy Aftercare  Wash gently with soap and water everyday.   Apply Vaseline and Band-Aid daily until healed.   Recommend starting moisturizer with exfoliant (Urea, Salicylic acid, or Lactic acid) one to two times daily to help smooth rough and bumpy skin.  OTC options include Cetaphil Rough and Bumpy lotion (Urea), Eucerin Roughness Relief lotion or spot treatment cream (Urea), CeraVe SA lotion/cream for Rough and Bumpy skin (Sal Acid), Gold Bond Rough and Bumpy cream (Sal Acid), and AmLactin 12% lotion/cream (Lactic Acid).  If applying in morning, also apply sunscreen to sun-exposed areas, since these exfoliating moisturizers can increase sensitivity to sun.   If You Need Anything After Your Visit  If you have any questions or concerns for your doctor, please call our main line at [phone] and press option 4 to reach your doctor's medical assistant. If no one answers, please leave a voicemail as directed and we will return your call as soon as possible. Messages left after 4 pm will be answered the following business day.   You may also send [place] a message via [place]. We typically respond to MyChart messages within 1-2 business days.  For prescription refills, please ask your pharmacy to contact our office. Our fax number is [phone].  If you have an urgent issue when the clinic is closed that cannot wait until the next business day, you can page your doctor at the number below.    Please note that while we do our best to be available for urgent issues outside of office hours, we are not available 24/7.   If you have an urgent issue and are unable to reach [place], you may choose to seek medical care at your doctor's office, retail clinic, urgent care center, or emergency room.  If you have a medical emergency, please immediately call 911 or go to the emergency department.  Pager Numbers  - Dr. Nehemiah Massed: [phone]  - Dr. Laurence Ferrari: [phone]  - Dr. Nicole Kindred: [phone]  In  the event of inclement weather, please call our main line at [phone] for an update on the status of any delays or closures.  Dermatology Medication Tips: Please keep the boxes that topical medications come in in order to help keep track of the instructions about where and how to use these. Pharmacies typically print the medication instructions only on the boxes and not directly on the medication tubes.   If your medication is too expensive, please contact our office at [phone] option 4 or send [place] a message through [place].   We are unable to tell what your co-pay for medications will be in advance as this is different depending on your insurance coverage. However, we may be able to find a substitute medication at lower cost or fill out paperwork to get insurance to cover a needed medication.   If a prior authorization is required to get your medication covered by your insurance company, please allow [place] 1-2 business days to complete this process.  Drug prices often vary depending on where the prescription is filled and some pharmacies may offer cheaper prices.  The website www.goodrx.com contains coupons for medications through different pharmacies. The prices here do not account for what the cost may be with help from insurance (it may be cheaper with your insurance), but the website can give you the price if you did not use any insurance.  - You can print the associated coupon and take it with your prescription to the pharmacy.  - You may also stop  by our office during regular business hours and pick up a GoodRx coupon card.  - If you need your prescription sent electronically to a different pharmacy, notify our office through [facility] or by phone at [phone] option 4.     Si Usted Necesita Algo Despus de Su Visita  Tambin puede enviarnos un mensaje a travs de Pharmacist, community. Por lo general respondemos a los mensajes de MyChart en el transcurso de 1 a 2 das  hbiles.  Para renovar recetas, por favor pida a su farmacia que se ponga en contacto con nuestra oficina. Harland Dingwall de fax es [place] [phone].  Si tiene un asunto urgente cuando la clnica est cerrada y que no puede esperar hasta el siguiente da hbil, puede llamar/localizar a su doctor(a) al nmero que aparece a continuacin.   Por favor, tenga en cuenta que aunque hacemos todo lo posible para estar disponibles para asuntos urgentes fuera del horario de [place], no estamos disponibles las 24 horas del da, los 7 das de la [place].   Si tiene un problema urgente y no puede comunicarse con nosotros, puede optar por buscar atencin mdica  en el consultorio de su doctor(a), en una clnica privada, en un centro de atencin urgente o en una sala de emergencias.  Si tiene Engineering geologist, por favor llame inmediatamente al 911 o vaya a la sala de emergencias.  Nmeros de bper  - Dr. Nehemiah Massed: [phone]  - Dra. Moye: [phone]  - Dra. Nicole Kindred: [phone]  En caso de inclemencias del [place], por favor llame a Johnsie Kindred principal al [phone] para una actualizacin sobre el [place] de cualquier retraso o cierre.  Consejos para la medicacin en dermatologa: Por favor, guarde las cajas en las que vienen los medicamentos de uso tpico para ayudarle a seguir las instrucciones sobre dnde y cmo usarlos. Las farmacias generalmente imprimen las instrucciones del medicamento slo en las cajas y no directamente en los tubos del [place].   Si su medicamento es muy caro, por favor, pngase en contacto con Zigmund Daniel llamando al [phone] y presione la opcin 4 o envenos un mensaje a travs de Pharmacist, community.   No podemos decirle cul ser su copago por los medicamentos por adelantado ya que esto es diferente dependiendo de la cobertura de su seguro. Sin embargo, es posible que podamos encontrar un medicamento sustituto a Electrical engineer un formulario para que el  seguro cubra el medicamento que se considera necesario.   Si se requiere una autorizacin previa para que su compaa de seguros [place] su medicamento, por favor permtanos de 1 a 2 das hbiles para completar este proceso.  Los precios de los medicamentos varan con frecuencia dependiendo del Environmental consultant de dnde se surte la receta y alguna farmacias pueden ofrecer precios ms baratos.  El sitio web www.goodrx.com tiene cupones para medicamentos de Airline pilot. Los precios aqu no tienen en cuenta lo que podra costar con la ayuda del seguro (puede ser ms barato con su seguro), pero el sitio web puede darle el precio si no utiliz Research scientist (physical sciences).  - Puede imprimir el cupn correspondiente y llevarlo con su receta a la farmacia.  - Tambin puede pasar por nuestra oficina durante el horario de atencin regular y Charity fundraiser una tarjeta de cupones de GoodRx.  - Si necesita que su receta se enve electrnicamente a una farmacia diferente, informe a nuestra oficina a travs de MyChart de  o por telfono llamando al [phone] y presione la opcin 4.

## 2021-11-02 DIAGNOSIS — R296 Repeated falls: Secondary | ICD-10-CM | POA: Diagnosis not present

## 2021-11-02 DIAGNOSIS — F0282 Dementia in other diseases classified elsewhere, unspecified severity, with psychotic disturbance: Secondary | ICD-10-CM | POA: Diagnosis not present

## 2021-11-02 DIAGNOSIS — M1990 Unspecified osteoarthritis, unspecified site: Secondary | ICD-10-CM | POA: Diagnosis not present

## 2021-11-02 DIAGNOSIS — G20 Parkinson's disease: Secondary | ICD-10-CM | POA: Diagnosis not present

## 2021-11-02 DIAGNOSIS — F02818 Dementia in other diseases classified elsewhere, unspecified severity, with other behavioral disturbance: Secondary | ICD-10-CM | POA: Diagnosis not present

## 2021-11-02 DIAGNOSIS — I10 Essential (primary) hypertension: Secondary | ICD-10-CM | POA: Diagnosis not present

## 2021-11-03 DIAGNOSIS — F0282 Dementia in other diseases classified elsewhere, unspecified severity, with psychotic disturbance: Secondary | ICD-10-CM | POA: Diagnosis not present

## 2021-11-03 DIAGNOSIS — Z6828 Body mass index (BMI) 28.0-28.9, adult: Secondary | ICD-10-CM | POA: Diagnosis not present

## 2021-11-03 DIAGNOSIS — M1990 Unspecified osteoarthritis, unspecified site: Secondary | ICD-10-CM | POA: Diagnosis not present

## 2021-11-03 DIAGNOSIS — H409 Unspecified glaucoma: Secondary | ICD-10-CM | POA: Diagnosis not present

## 2021-11-03 DIAGNOSIS — R32 Unspecified urinary incontinence: Secondary | ICD-10-CM | POA: Diagnosis not present

## 2021-11-03 DIAGNOSIS — R7303 Prediabetes: Secondary | ICD-10-CM | POA: Diagnosis not present

## 2021-11-03 DIAGNOSIS — R296 Repeated falls: Secondary | ICD-10-CM | POA: Diagnosis not present

## 2021-11-03 DIAGNOSIS — E785 Hyperlipidemia, unspecified: Secondary | ICD-10-CM | POA: Diagnosis not present

## 2021-11-03 DIAGNOSIS — Z87891 Personal history of nicotine dependence: Secondary | ICD-10-CM | POA: Diagnosis not present

## 2021-11-03 DIAGNOSIS — G4752 REM sleep behavior disorder: Secondary | ICD-10-CM | POA: Diagnosis not present

## 2021-11-03 DIAGNOSIS — F02818 Dementia in other diseases classified elsewhere, unspecified severity, with other behavioral disturbance: Secondary | ICD-10-CM | POA: Diagnosis not present

## 2021-11-03 DIAGNOSIS — I10 Essential (primary) hypertension: Secondary | ICD-10-CM | POA: Diagnosis not present

## 2021-11-03 DIAGNOSIS — K219 Gastro-esophageal reflux disease without esophagitis: Secondary | ICD-10-CM | POA: Diagnosis not present

## 2021-11-03 DIAGNOSIS — G20 Parkinson's disease: Secondary | ICD-10-CM | POA: Diagnosis not present

## 2021-11-03 DIAGNOSIS — N3281 Overactive bladder: Secondary | ICD-10-CM | POA: Diagnosis not present

## 2021-11-03 DIAGNOSIS — K5900 Constipation, unspecified: Secondary | ICD-10-CM | POA: Diagnosis not present

## 2021-11-03 DIAGNOSIS — R634 Abnormal weight loss: Secondary | ICD-10-CM | POA: Diagnosis not present

## 2021-11-08 DIAGNOSIS — M1990 Unspecified osteoarthritis, unspecified site: Secondary | ICD-10-CM | POA: Diagnosis not present

## 2021-11-08 DIAGNOSIS — G20 Parkinson's disease: Secondary | ICD-10-CM | POA: Diagnosis not present

## 2021-11-08 DIAGNOSIS — R296 Repeated falls: Secondary | ICD-10-CM | POA: Diagnosis not present

## 2021-11-08 DIAGNOSIS — F0282 Dementia in other diseases classified elsewhere, unspecified severity, with psychotic disturbance: Secondary | ICD-10-CM | POA: Diagnosis not present

## 2021-11-08 DIAGNOSIS — I10 Essential (primary) hypertension: Secondary | ICD-10-CM | POA: Diagnosis not present

## 2021-11-08 DIAGNOSIS — F02818 Dementia in other diseases classified elsewhere, unspecified severity, with other behavioral disturbance: Secondary | ICD-10-CM | POA: Diagnosis not present

## 2021-11-10 DIAGNOSIS — R296 Repeated falls: Secondary | ICD-10-CM | POA: Diagnosis not present

## 2021-11-10 DIAGNOSIS — I10 Essential (primary) hypertension: Secondary | ICD-10-CM | POA: Diagnosis not present

## 2021-11-10 DIAGNOSIS — M1990 Unspecified osteoarthritis, unspecified site: Secondary | ICD-10-CM | POA: Diagnosis not present

## 2021-11-10 DIAGNOSIS — G20 Parkinson's disease: Secondary | ICD-10-CM | POA: Diagnosis not present

## 2021-11-10 DIAGNOSIS — F02818 Dementia in other diseases classified elsewhere, unspecified severity, with other behavioral disturbance: Secondary | ICD-10-CM | POA: Diagnosis not present

## 2021-11-10 DIAGNOSIS — F0282 Dementia in other diseases classified elsewhere, unspecified severity, with psychotic disturbance: Secondary | ICD-10-CM | POA: Diagnosis not present

## 2021-11-15 DIAGNOSIS — M1990 Unspecified osteoarthritis, unspecified site: Secondary | ICD-10-CM | POA: Diagnosis not present

## 2021-11-15 DIAGNOSIS — I10 Essential (primary) hypertension: Secondary | ICD-10-CM | POA: Diagnosis not present

## 2021-11-15 DIAGNOSIS — F0282 Dementia in other diseases classified elsewhere, unspecified severity, with psychotic disturbance: Secondary | ICD-10-CM | POA: Diagnosis not present

## 2021-11-15 DIAGNOSIS — R296 Repeated falls: Secondary | ICD-10-CM | POA: Diagnosis not present

## 2021-11-15 DIAGNOSIS — F02818 Dementia in other diseases classified elsewhere, unspecified severity, with other behavioral disturbance: Secondary | ICD-10-CM | POA: Diagnosis not present

## 2021-11-15 DIAGNOSIS — G20 Parkinson's disease: Secondary | ICD-10-CM | POA: Diagnosis not present

## 2021-11-19 DIAGNOSIS — M1990 Unspecified osteoarthritis, unspecified site: Secondary | ICD-10-CM | POA: Diagnosis not present

## 2021-11-19 DIAGNOSIS — I10 Essential (primary) hypertension: Secondary | ICD-10-CM | POA: Diagnosis not present

## 2021-11-19 DIAGNOSIS — R296 Repeated falls: Secondary | ICD-10-CM | POA: Diagnosis not present

## 2021-11-19 DIAGNOSIS — F02818 Dementia in other diseases classified elsewhere, unspecified severity, with other behavioral disturbance: Secondary | ICD-10-CM | POA: Diagnosis not present

## 2021-11-19 DIAGNOSIS — G20 Parkinson's disease: Secondary | ICD-10-CM | POA: Diagnosis not present

## 2021-11-19 DIAGNOSIS — F0282 Dementia in other diseases classified elsewhere, unspecified severity, with psychotic disturbance: Secondary | ICD-10-CM | POA: Diagnosis not present

## 2021-11-22 DIAGNOSIS — G20 Parkinson's disease: Secondary | ICD-10-CM | POA: Diagnosis not present

## 2021-11-22 DIAGNOSIS — F0282 Dementia in other diseases classified elsewhere, unspecified severity, with psychotic disturbance: Secondary | ICD-10-CM | POA: Diagnosis not present

## 2021-11-22 DIAGNOSIS — I10 Essential (primary) hypertension: Secondary | ICD-10-CM | POA: Diagnosis not present

## 2021-11-22 DIAGNOSIS — R296 Repeated falls: Secondary | ICD-10-CM | POA: Diagnosis not present

## 2021-11-22 DIAGNOSIS — M1990 Unspecified osteoarthritis, unspecified site: Secondary | ICD-10-CM | POA: Diagnosis not present

## 2021-11-22 DIAGNOSIS — F02818 Dementia in other diseases classified elsewhere, unspecified severity, with other behavioral disturbance: Secondary | ICD-10-CM | POA: Diagnosis not present

## 2021-11-24 DIAGNOSIS — R296 Repeated falls: Secondary | ICD-10-CM | POA: Diagnosis not present

## 2021-11-24 DIAGNOSIS — I10 Essential (primary) hypertension: Secondary | ICD-10-CM | POA: Diagnosis not present

## 2021-11-24 DIAGNOSIS — G20 Parkinson's disease: Secondary | ICD-10-CM | POA: Diagnosis not present

## 2021-11-24 DIAGNOSIS — M1990 Unspecified osteoarthritis, unspecified site: Secondary | ICD-10-CM | POA: Diagnosis not present

## 2021-11-24 DIAGNOSIS — F0282 Dementia in other diseases classified elsewhere, unspecified severity, with psychotic disturbance: Secondary | ICD-10-CM | POA: Diagnosis not present

## 2021-11-24 DIAGNOSIS — F02818 Dementia in other diseases classified elsewhere, unspecified severity, with other behavioral disturbance: Secondary | ICD-10-CM | POA: Diagnosis not present

## 2021-11-24 DIAGNOSIS — K219 Gastro-esophageal reflux disease without esophagitis: Secondary | ICD-10-CM | POA: Diagnosis not present

## 2021-11-30 ENCOUNTER — Other Ambulatory Visit: Payer: Self-pay | Admitting: Family Medicine

## 2021-11-30 DIAGNOSIS — M1990 Unspecified osteoarthritis, unspecified site: Secondary | ICD-10-CM | POA: Diagnosis not present

## 2021-11-30 DIAGNOSIS — R296 Repeated falls: Secondary | ICD-10-CM | POA: Diagnosis not present

## 2021-11-30 DIAGNOSIS — G20 Parkinson's disease: Secondary | ICD-10-CM | POA: Diagnosis not present

## 2021-11-30 DIAGNOSIS — I10 Essential (primary) hypertension: Secondary | ICD-10-CM | POA: Diagnosis not present

## 2021-11-30 DIAGNOSIS — F0282 Dementia in other diseases classified elsewhere, unspecified severity, with psychotic disturbance: Secondary | ICD-10-CM | POA: Diagnosis not present

## 2021-11-30 DIAGNOSIS — F02818 Dementia in other diseases classified elsewhere, unspecified severity, with other behavioral disturbance: Secondary | ICD-10-CM | POA: Diagnosis not present

## 2021-12-03 DIAGNOSIS — K5900 Constipation, unspecified: Secondary | ICD-10-CM | POA: Diagnosis not present

## 2021-12-03 DIAGNOSIS — Z6828 Body mass index (BMI) 28.0-28.9, adult: Secondary | ICD-10-CM | POA: Diagnosis not present

## 2021-12-03 DIAGNOSIS — R634 Abnormal weight loss: Secondary | ICD-10-CM | POA: Diagnosis not present

## 2021-12-03 DIAGNOSIS — H409 Unspecified glaucoma: Secondary | ICD-10-CM | POA: Diagnosis not present

## 2021-12-03 DIAGNOSIS — R32 Unspecified urinary incontinence: Secondary | ICD-10-CM | POA: Diagnosis not present

## 2021-12-03 DIAGNOSIS — G20 Parkinson's disease: Secondary | ICD-10-CM | POA: Diagnosis not present

## 2021-12-03 DIAGNOSIS — F0282 Dementia in other diseases classified elsewhere, unspecified severity, with psychotic disturbance: Secondary | ICD-10-CM | POA: Diagnosis not present

## 2021-12-03 DIAGNOSIS — N3281 Overactive bladder: Secondary | ICD-10-CM | POA: Diagnosis not present

## 2021-12-03 DIAGNOSIS — Z87891 Personal history of nicotine dependence: Secondary | ICD-10-CM | POA: Diagnosis not present

## 2021-12-03 DIAGNOSIS — G4752 REM sleep behavior disorder: Secondary | ICD-10-CM | POA: Diagnosis not present

## 2021-12-03 DIAGNOSIS — M1990 Unspecified osteoarthritis, unspecified site: Secondary | ICD-10-CM | POA: Diagnosis not present

## 2021-12-03 DIAGNOSIS — R7303 Prediabetes: Secondary | ICD-10-CM | POA: Diagnosis not present

## 2021-12-03 DIAGNOSIS — K219 Gastro-esophageal reflux disease without esophagitis: Secondary | ICD-10-CM | POA: Diagnosis not present

## 2021-12-03 DIAGNOSIS — F02818 Dementia in other diseases classified elsewhere, unspecified severity, with other behavioral disturbance: Secondary | ICD-10-CM | POA: Diagnosis not present

## 2021-12-03 DIAGNOSIS — R296 Repeated falls: Secondary | ICD-10-CM | POA: Diagnosis not present

## 2021-12-03 DIAGNOSIS — E785 Hyperlipidemia, unspecified: Secondary | ICD-10-CM | POA: Diagnosis not present

## 2021-12-03 DIAGNOSIS — I10 Essential (primary) hypertension: Secondary | ICD-10-CM | POA: Diagnosis not present

## 2021-12-07 DIAGNOSIS — R296 Repeated falls: Secondary | ICD-10-CM | POA: Diagnosis not present

## 2021-12-07 DIAGNOSIS — G20 Parkinson's disease: Secondary | ICD-10-CM | POA: Diagnosis not present

## 2021-12-07 DIAGNOSIS — F02818 Dementia in other diseases classified elsewhere, unspecified severity, with other behavioral disturbance: Secondary | ICD-10-CM | POA: Diagnosis not present

## 2021-12-07 DIAGNOSIS — I10 Essential (primary) hypertension: Secondary | ICD-10-CM | POA: Diagnosis not present

## 2021-12-07 DIAGNOSIS — F0282 Dementia in other diseases classified elsewhere, unspecified severity, with psychotic disturbance: Secondary | ICD-10-CM | POA: Diagnosis not present

## 2021-12-07 DIAGNOSIS — M1990 Unspecified osteoarthritis, unspecified site: Secondary | ICD-10-CM | POA: Diagnosis not present

## 2021-12-13 DIAGNOSIS — I10 Essential (primary) hypertension: Secondary | ICD-10-CM | POA: Diagnosis not present

## 2021-12-13 DIAGNOSIS — F0282 Dementia in other diseases classified elsewhere, unspecified severity, with psychotic disturbance: Secondary | ICD-10-CM | POA: Diagnosis not present

## 2021-12-13 DIAGNOSIS — G20 Parkinson's disease: Secondary | ICD-10-CM | POA: Diagnosis not present

## 2021-12-13 DIAGNOSIS — F02818 Dementia in other diseases classified elsewhere, unspecified severity, with other behavioral disturbance: Secondary | ICD-10-CM | POA: Diagnosis not present

## 2021-12-13 DIAGNOSIS — M1990 Unspecified osteoarthritis, unspecified site: Secondary | ICD-10-CM | POA: Diagnosis not present

## 2021-12-13 DIAGNOSIS — R296 Repeated falls: Secondary | ICD-10-CM | POA: Diagnosis not present

## 2021-12-16 DIAGNOSIS — R0602 Shortness of breath: Secondary | ICD-10-CM | POA: Diagnosis not present

## 2021-12-16 DIAGNOSIS — I10 Essential (primary) hypertension: Secondary | ICD-10-CM | POA: Diagnosis not present

## 2021-12-16 DIAGNOSIS — E782 Mixed hyperlipidemia: Secondary | ICD-10-CM | POA: Diagnosis not present

## 2021-12-16 DIAGNOSIS — R001 Bradycardia, unspecified: Secondary | ICD-10-CM | POA: Diagnosis not present

## 2021-12-21 DIAGNOSIS — R296 Repeated falls: Secondary | ICD-10-CM | POA: Diagnosis not present

## 2021-12-21 DIAGNOSIS — M1990 Unspecified osteoarthritis, unspecified site: Secondary | ICD-10-CM | POA: Diagnosis not present

## 2021-12-21 DIAGNOSIS — F0282 Dementia in other diseases classified elsewhere, unspecified severity, with psychotic disturbance: Secondary | ICD-10-CM | POA: Diagnosis not present

## 2021-12-21 DIAGNOSIS — F02818 Dementia in other diseases classified elsewhere, unspecified severity, with other behavioral disturbance: Secondary | ICD-10-CM | POA: Diagnosis not present

## 2021-12-21 DIAGNOSIS — I10 Essential (primary) hypertension: Secondary | ICD-10-CM | POA: Diagnosis not present

## 2021-12-21 DIAGNOSIS — G20 Parkinson's disease: Secondary | ICD-10-CM | POA: Diagnosis not present

## 2021-12-28 DIAGNOSIS — I10 Essential (primary) hypertension: Secondary | ICD-10-CM | POA: Diagnosis not present

## 2021-12-28 DIAGNOSIS — F02818 Dementia in other diseases classified elsewhere, unspecified severity, with other behavioral disturbance: Secondary | ICD-10-CM | POA: Diagnosis not present

## 2021-12-28 DIAGNOSIS — M1990 Unspecified osteoarthritis, unspecified site: Secondary | ICD-10-CM | POA: Diagnosis not present

## 2021-12-28 DIAGNOSIS — R296 Repeated falls: Secondary | ICD-10-CM | POA: Diagnosis not present

## 2021-12-28 DIAGNOSIS — G20 Parkinson's disease: Secondary | ICD-10-CM | POA: Diagnosis not present

## 2021-12-28 DIAGNOSIS — F0282 Dementia in other diseases classified elsewhere, unspecified severity, with psychotic disturbance: Secondary | ICD-10-CM | POA: Diagnosis not present

## 2022-01-02 DIAGNOSIS — N3281 Overactive bladder: Secondary | ICD-10-CM | POA: Diagnosis not present

## 2022-01-02 DIAGNOSIS — F0282 Dementia in other diseases classified elsewhere, unspecified severity, with psychotic disturbance: Secondary | ICD-10-CM | POA: Diagnosis not present

## 2022-01-02 DIAGNOSIS — K5900 Constipation, unspecified: Secondary | ICD-10-CM | POA: Diagnosis not present

## 2022-01-02 DIAGNOSIS — G4752 REM sleep behavior disorder: Secondary | ICD-10-CM | POA: Diagnosis not present

## 2022-01-02 DIAGNOSIS — G20 Parkinson's disease: Secondary | ICD-10-CM | POA: Diagnosis not present

## 2022-01-02 DIAGNOSIS — I10 Essential (primary) hypertension: Secondary | ICD-10-CM | POA: Diagnosis not present

## 2022-01-02 DIAGNOSIS — R001 Bradycardia, unspecified: Secondary | ICD-10-CM | POA: Diagnosis not present

## 2022-01-02 DIAGNOSIS — R634 Abnormal weight loss: Secondary | ICD-10-CM | POA: Diagnosis not present

## 2022-01-02 DIAGNOSIS — R32 Unspecified urinary incontinence: Secondary | ICD-10-CM | POA: Diagnosis not present

## 2022-01-02 DIAGNOSIS — R7303 Prediabetes: Secondary | ICD-10-CM | POA: Diagnosis not present

## 2022-01-02 DIAGNOSIS — M1990 Unspecified osteoarthritis, unspecified site: Secondary | ICD-10-CM | POA: Diagnosis not present

## 2022-01-02 DIAGNOSIS — Z87891 Personal history of nicotine dependence: Secondary | ICD-10-CM | POA: Diagnosis not present

## 2022-01-02 DIAGNOSIS — F02818 Dementia in other diseases classified elsewhere, unspecified severity, with other behavioral disturbance: Secondary | ICD-10-CM | POA: Diagnosis not present

## 2022-01-02 DIAGNOSIS — K219 Gastro-esophageal reflux disease without esophagitis: Secondary | ICD-10-CM | POA: Diagnosis not present

## 2022-01-02 DIAGNOSIS — H409 Unspecified glaucoma: Secondary | ICD-10-CM | POA: Diagnosis not present

## 2022-01-02 DIAGNOSIS — R296 Repeated falls: Secondary | ICD-10-CM | POA: Diagnosis not present

## 2022-01-02 DIAGNOSIS — Z6828 Body mass index (BMI) 28.0-28.9, adult: Secondary | ICD-10-CM | POA: Diagnosis not present

## 2022-01-02 DIAGNOSIS — E785 Hyperlipidemia, unspecified: Secondary | ICD-10-CM | POA: Diagnosis not present

## 2022-01-03 DIAGNOSIS — F02818 Dementia in other diseases classified elsewhere, unspecified severity, with other behavioral disturbance: Secondary | ICD-10-CM | POA: Diagnosis not present

## 2022-01-03 DIAGNOSIS — G20 Parkinson's disease: Secondary | ICD-10-CM | POA: Diagnosis not present

## 2022-01-03 DIAGNOSIS — F0282 Dementia in other diseases classified elsewhere, unspecified severity, with psychotic disturbance: Secondary | ICD-10-CM | POA: Diagnosis not present

## 2022-01-03 DIAGNOSIS — M1990 Unspecified osteoarthritis, unspecified site: Secondary | ICD-10-CM | POA: Diagnosis not present

## 2022-01-03 DIAGNOSIS — I10 Essential (primary) hypertension: Secondary | ICD-10-CM | POA: Diagnosis not present

## 2022-01-03 DIAGNOSIS — R296 Repeated falls: Secondary | ICD-10-CM | POA: Diagnosis not present

## 2022-01-04 DIAGNOSIS — R569 Unspecified convulsions: Secondary | ICD-10-CM | POA: Diagnosis not present

## 2022-01-04 DIAGNOSIS — G20 Parkinson's disease: Secondary | ICD-10-CM | POA: Diagnosis not present

## 2022-01-04 DIAGNOSIS — F0280 Dementia in other diseases classified elsewhere without behavioral disturbance: Secondary | ICD-10-CM | POA: Diagnosis not present

## 2022-01-04 DIAGNOSIS — G4752 REM sleep behavior disorder: Secondary | ICD-10-CM | POA: Diagnosis not present

## 2022-01-11 DIAGNOSIS — R296 Repeated falls: Secondary | ICD-10-CM | POA: Diagnosis not present

## 2022-01-11 DIAGNOSIS — I10 Essential (primary) hypertension: Secondary | ICD-10-CM | POA: Diagnosis not present

## 2022-01-11 DIAGNOSIS — M1990 Unspecified osteoarthritis, unspecified site: Secondary | ICD-10-CM | POA: Diagnosis not present

## 2022-01-11 DIAGNOSIS — F0282 Dementia in other diseases classified elsewhere, unspecified severity, with psychotic disturbance: Secondary | ICD-10-CM | POA: Diagnosis not present

## 2022-01-11 DIAGNOSIS — G20 Parkinson's disease: Secondary | ICD-10-CM | POA: Diagnosis not present

## 2022-01-11 DIAGNOSIS — F02818 Dementia in other diseases classified elsewhere, unspecified severity, with other behavioral disturbance: Secondary | ICD-10-CM | POA: Diagnosis not present

## 2022-01-17 DIAGNOSIS — R0602 Shortness of breath: Secondary | ICD-10-CM | POA: Diagnosis not present

## 2022-01-17 DIAGNOSIS — R296 Repeated falls: Secondary | ICD-10-CM | POA: Diagnosis not present

## 2022-01-17 DIAGNOSIS — R001 Bradycardia, unspecified: Secondary | ICD-10-CM | POA: Diagnosis not present

## 2022-01-17 DIAGNOSIS — E782 Mixed hyperlipidemia: Secondary | ICD-10-CM | POA: Diagnosis not present

## 2022-01-17 DIAGNOSIS — F02818 Dementia in other diseases classified elsewhere, unspecified severity, with other behavioral disturbance: Secondary | ICD-10-CM | POA: Diagnosis not present

## 2022-01-17 DIAGNOSIS — M1990 Unspecified osteoarthritis, unspecified site: Secondary | ICD-10-CM | POA: Diagnosis not present

## 2022-01-17 DIAGNOSIS — G20 Parkinson's disease: Secondary | ICD-10-CM | POA: Diagnosis not present

## 2022-01-17 DIAGNOSIS — F0282 Dementia in other diseases classified elsewhere, unspecified severity, with psychotic disturbance: Secondary | ICD-10-CM | POA: Diagnosis not present

## 2022-01-17 DIAGNOSIS — I10 Essential (primary) hypertension: Secondary | ICD-10-CM | POA: Diagnosis not present

## 2022-01-19 ENCOUNTER — Other Ambulatory Visit: Payer: Self-pay | Admitting: Family Medicine

## 2022-01-19 DIAGNOSIS — E785 Hyperlipidemia, unspecified: Secondary | ICD-10-CM

## 2022-01-19 DIAGNOSIS — R7303 Prediabetes: Secondary | ICD-10-CM

## 2022-01-20 ENCOUNTER — Other Ambulatory Visit (INDEPENDENT_AMBULATORY_CARE_PROVIDER_SITE_OTHER): Payer: Medicare Other

## 2022-01-20 DIAGNOSIS — E785 Hyperlipidemia, unspecified: Secondary | ICD-10-CM | POA: Diagnosis not present

## 2022-01-20 DIAGNOSIS — R7303 Prediabetes: Secondary | ICD-10-CM | POA: Diagnosis not present

## 2022-01-20 LAB — COMPREHENSIVE METABOLIC PANEL
ALT: 6 U/L (ref 0–53)
AST: 15 U/L (ref 0–37)
Albumin: 3.8 g/dL (ref 3.5–5.2)
Alkaline Phosphatase: 42 U/L (ref 39–117)
BUN: 16 mg/dL (ref 6–23)
CO2: 33 mEq/L — ABNORMAL HIGH (ref 19–32)
Calcium: 9.9 mg/dL (ref 8.4–10.5)
Chloride: 108 mEq/L (ref 96–112)
Creatinine, Ser: 0.9 mg/dL (ref 0.40–1.50)
GFR: 77.91 mL/min (ref >60.00)
Glucose, Bld: 95 mg/dL (ref 70–99)
Potassium: 4 mEq/L (ref 3.5–5.1)
Sodium: 142 mEq/L (ref 135–145)
Total Bilirubin: 0.8 mg/dL (ref 0.2–1.2)
Total Protein: 6.3 g/dL (ref 6.0–8.3)

## 2022-01-20 LAB — LIPID PANEL
Cholesterol: 171 mg/dL (ref 0–200)
HDL: 36.7 mg/dL — ABNORMAL LOW (ref >39.00)
LDL Cholesterol: 102 mg/dL — ABNORMAL HIGH (ref 0–99)
NonHDL: 134.22
Total CHOL/HDL Ratio: 5
Triglycerides: 162 mg/dL — ABNORMAL HIGH (ref 0.0–149.0)
VLDL: 32.4 mg/dL (ref 0.0–40.0)

## 2022-01-20 LAB — HEMOGLOBIN A1C: Hgb A1c MFr Bld: 5.5 % (ref 4.6–6.5)

## 2022-01-25 NOTE — Telephone Encounter (Signed)
Opened in error

## 2022-01-26 DIAGNOSIS — F02818 Dementia in other diseases classified elsewhere, unspecified severity, with other behavioral disturbance: Secondary | ICD-10-CM | POA: Diagnosis not present

## 2022-01-26 DIAGNOSIS — G20 Parkinson's disease: Secondary | ICD-10-CM | POA: Diagnosis not present

## 2022-01-26 DIAGNOSIS — R296 Repeated falls: Secondary | ICD-10-CM | POA: Diagnosis not present

## 2022-01-26 DIAGNOSIS — I10 Essential (primary) hypertension: Secondary | ICD-10-CM | POA: Diagnosis not present

## 2022-01-26 DIAGNOSIS — F0282 Dementia in other diseases classified elsewhere, unspecified severity, with psychotic disturbance: Secondary | ICD-10-CM | POA: Diagnosis not present

## 2022-01-26 DIAGNOSIS — M1990 Unspecified osteoarthritis, unspecified site: Secondary | ICD-10-CM | POA: Diagnosis not present

## 2022-01-27 ENCOUNTER — Ambulatory Visit (INDEPENDENT_AMBULATORY_CARE_PROVIDER_SITE_OTHER): Payer: Medicare Other | Admitting: Family Medicine

## 2022-01-27 ENCOUNTER — Encounter: Payer: Self-pay | Admitting: Family Medicine

## 2022-01-27 ENCOUNTER — Ambulatory Visit: Payer: Medicare Other

## 2022-01-27 ENCOUNTER — Ambulatory Visit (INDEPENDENT_AMBULATORY_CARE_PROVIDER_SITE_OTHER): Payer: Medicare Other

## 2022-01-27 VITALS — Wt 208.0 lb

## 2022-01-27 VITALS — BP 122/62 | HR 58 | Temp 97.7°F | Ht 70.0 in | Wt 203.4 lb

## 2022-01-27 DIAGNOSIS — N400 Enlarged prostate without lower urinary tract symptoms: Secondary | ICD-10-CM

## 2022-01-27 DIAGNOSIS — Z7189 Other specified counseling: Secondary | ICD-10-CM

## 2022-01-27 DIAGNOSIS — G20 Parkinson's disease: Secondary | ICD-10-CM

## 2022-01-27 DIAGNOSIS — K5909 Other constipation: Secondary | ICD-10-CM | POA: Diagnosis not present

## 2022-01-27 DIAGNOSIS — E785 Hyperlipidemia, unspecified: Secondary | ICD-10-CM | POA: Diagnosis not present

## 2022-01-27 DIAGNOSIS — R269 Unspecified abnormalities of gait and mobility: Secondary | ICD-10-CM | POA: Diagnosis not present

## 2022-01-27 DIAGNOSIS — G903 Multi-system degeneration of the autonomic nervous system: Secondary | ICD-10-CM

## 2022-01-27 DIAGNOSIS — I10 Essential (primary) hypertension: Secondary | ICD-10-CM

## 2022-01-27 DIAGNOSIS — K219 Gastro-esophageal reflux disease without esophagitis: Secondary | ICD-10-CM | POA: Diagnosis not present

## 2022-01-27 DIAGNOSIS — Z0000 Encounter for general adult medical examination without abnormal findings: Secondary | ICD-10-CM

## 2022-01-27 DIAGNOSIS — R7303 Prediabetes: Secondary | ICD-10-CM | POA: Diagnosis not present

## 2022-01-27 MED ORDER — LISINOPRIL 20 MG PO TABS: 20.0000 mg | ORAL_TABLET | Freq: Every day | ORAL | 3 refills | Status: DC

## 2022-01-27 MED ORDER — POLYETHYLENE GLYCOL 3350 17 GM/SCOOP PO POWD: 17.0000 g | Freq: Every day | ORAL | Status: AC | PRN

## 2022-01-27 NOTE — Assessment & Plan Note (Signed)
Stable period now off amlodipine. Continue to monitor

## 2022-01-27 NOTE — Patient Instructions (Addendum)
Stay off amlodipine and simvastatin.  If interested, check with pharmacy about new 2 shot shingles series (shingrix).  Work on regular bowel regimen ie miralax, metamucil, water and fiber in diet, and as needed linzess.  Return in 6 months for follow up visit  Health Maintenance After Age 85 After age 70, you are at a higher risk for certain long-term diseases and infections as well as injuries from falls. Falls are a major cause of broken bones and head injuries in people who are older than age 50. Getting regular preventive care can help to keep you healthy and well. Preventive care includes getting regular testing and making lifestyle changes as recommended by your health care provider. Talk with your health care provider about: Which screenings and tests you should have. A screening is a test that checks for a disease when you have no symptoms. A diet and exercise plan that is right for you. What should I know about screenings and tests to prevent falls? Screening and testing are the best ways to find a health problem early. Early diagnosis and treatment give you the best chance of managing medical conditions that are common after age 38. Certain conditions and lifestyle choices may make you more likely to have a fall. Your health care provider may recommend: Regular vision checks. Poor vision and conditions such as cataracts can make you more likely to have a fall. If you wear glasses, make sure to get your prescription updated if your vision changes. Medicine review. Work with your health care provider to regularly review all of the medicines you are taking, including over-the-counter medicines. Ask your health care provider about any side effects that may make you more likely to have a fall. Tell your health care provider if any medicines that you take make you feel dizzy or sleepy. Strength and balance checks. Your health care provider may recommend certain tests to check your strength and  balance while standing, walking, or changing positions. Foot health exam. Foot pain and numbness, as well as not wearing proper footwear, can make you more likely to have a fall. Screenings, including: Osteoporosis screening. Osteoporosis is a condition that causes the bones to get weaker and break more easily. Blood pressure screening. Blood pressure changes and medicines to control blood pressure can make you feel dizzy. Depression screening. You may be more likely to have a fall if you have a fear of falling, feel depressed, or feel unable to do activities that you used to do. Alcohol use screening. Using too much alcohol can affect your balance and may make you more likely to have a fall. Follow these instructions at home: Lifestyle Do not drink alcohol if: Your health care provider tells you not to drink. If you drink alcohol: Limit how much you have to: 0-1 drink a day for women. 0-2 drinks a day for men. Know how much alcohol is in your drink. In the [place] one drink equals one 12 oz bottle of beer (355 mL), one 5 oz glass of wine (148 mL), or one 1 oz glass of hard liquor (44 mL). Do not use any products that contain nicotine or tobacco. These products include cigarettes, chewing tobacco, and vaping devices, such as e-cigarettes. If you need help quitting, ask your health care provider. Activity  Follow a regular exercise program to stay fit. This will help you maintain your balance. Ask your health care provider what types of exercise are appropriate for you. If you need a cane or walker,  use it as recommended by your health care provider. Wear supportive shoes that have nonskid soles. Safety  Remove any tripping hazards, such as rugs, cords, and clutter. Install safety equipment such as grab bars in bathrooms and safety rails on stairs. Keep rooms and walkways well-lit. General instructions Talk with your health care provider about your risks for falling. Tell your health care  provider if: You fall. Be sure to tell your health care provider about all falls, even ones that seem minor. You feel dizzy, tiredness (fatigue), or off-balance. Take over-the-counter and prescription medicines only as told by your health care provider. These include supplements. Eat a healthy diet and maintain a healthy weight. A healthy diet includes low-fat dairy products, low-fat (lean) meats, and fiber from whole grains, beans, and lots of fruits and vegetables. Stay current with your vaccines. Schedule regular health, dental, and eye exams. Summary Having a healthy lifestyle and getting preventive care can help to protect your health and wellness after age 49. Screening and testing are the best way to find a health problem early and help you avoid having a fall. Early diagnosis and treatment give you the best chance for managing medical conditions that are more common for people who are older than age 45. Falls are a major cause of broken bones and head injuries in people who are older than age 66. Take precautions to prevent a fall at home. Work with your health care provider to learn what changes you can make to improve your health and wellness and to prevent falls. This information is not intended to replace advice given to you by your health care provider. Make sure you discuss any questions you have with your health care provider. Document Revised: 10/11/2020 Document Reviewed: 10/11/2020 Elsevier Patient Education  [place].

## 2022-01-27 NOTE — Assessment & Plan Note (Addendum)
Fibrate stopped March of this year.  He was also on simvastatin, cardiology has since stopped statin.  Will remain off all cholesterol medication at this time.

## 2022-01-27 NOTE — Progress Notes (Signed)
Patient ID: Joshua Everett, male    DOB: Oct 17, 1934, 87 y.o.   MRN: [id number]  This visit was conducted in person.  BP 122/62   Pulse (!) 58   Temp 97.7 F (36.5 C) (Temporal)   Ht '5\' 10"'$  (1.778 m)   Wt 203 lb 6 oz (92.3 kg)   SpO2 96%   BMI 29.18 kg/m    CC: AMW f/u visit  Subjective:   HPI: Joshua Everett is a 87 y.o. male presenting on 01/27/2022 for Annual Exam (MCR prt 2. )   Saw health advisor today for medicare wellness visit. Note reviewed.   No results found.  Flowsheet Row Clinical Support from 01/27/2022 in [facility] [place]  PHQ-2 Total Score 1          01/27/2022   10:47 AM 01/26/2021    2:43 PM 01/16/2020    9:31 AM 01/13/2019   10:39 AM 12/26/2017    9:30 AM  Fall Risk   Falls in the past year? 1 0 0 0 No  Number falls in past yr: 1 0 0    Injury with Fall? 0 0 0    Risk for fall due to : History of fall(s)  Medication side effect    Follow up Falls evaluation completed;Falls prevention discussed  Falls evaluation completed;Falls prevention discussed    Lamictal related falls, but also stays unsteady. Wife planning to start using wheelchair/transport chair. Still unsteady despite using cane or walker.   PD followed by [facility] Dr Joshua Everett on sinemet + rivastigmine (excelon). Started on lamictal 25/'25mg'$  bid - this was stopped 09/2021 due to increased fall risk. 1 seizure in the past few months. Melatonin for REM sleep behavior disorder. H/o orthostatic hypotension. Started on gabapentin '100mg'$  at night time.   Overactive bladder has seen uro now off myrbetriq, flomax, desmopressin. Joshua Everett was helpful but not covered by insurance. Previous trial of vesicare. Accidents every night. Vesicare stopped due to concern over confusion and hallucinations - with improvement noted. Uses 2 pads nightly.   Saw cards - Joshua Everett stopped amlodipine and simvastatin.    Care team: Dermatology Dr Joshua Everett Neurology: Dr Joshua Everett Urology: Dr  Joshua Everett Ophthalmology: Dr Joshua Everett GI: Dr Joshua Everett Cardiology: Dr Joshua Everett - released from care 2023  Preventative: COLONOSCOPY Date: 2010 TAx1, rpt 5 yrs (Dr Joshua Everett GI in [facility]) complicated by marked bradycardia s/p ER evaluation. iFOB negative 01/2017. Aged out.  Prostate cancer screening - aged out  Lung cancer screen - aged out  Declines flu shot. [place] 07/2019, 08/2019, booster 04/2020 Prevnar-13 03/2014, pneumovax 2009.  Tdap 2015 zostavax 04/2013 shingrix - discussed - to check with pharmacy.  Advanced directives - scanned 01/2016. No life prolonging measures if terminal irreversible condition. Separate HCPOA form filled out - Joshua Everett wife then Joshua Everett are [place]. Scanned 02/2017. Will check at home to ensure no update is needed.  Seat belt use discussed.  Sunscreen use discussed. Sees dermatology (Dr Joshua Everett).  Ex smoker quit 1970  Alcohol - seldom Dentist - Q6 mo  Eye exam - yearly (glaucoma)  Bowel - constipation well managed on linzess as well as miralax and metamucil gummies.  Bladder - ongoing incontinence (OAB). Gemtesa not covered.   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 Activity: stopped golfing 6 months ago. Trouble with balance, was no longer fun.  Diet: good water, fruits/vegetables daily.      Relevant past medical, surgical, family  and social history reviewed and updated as indicated. Interim medical history since our last visit reviewed. Allergies and medications reviewed and updated. Outpatient Medications Prior to Visit  Medication Sig Dispense Refill   bimatoprost (LUMIGAN) 0.01 % SOLN Place 1 drop into both eyes at bedtime.      carbidopa-levodopa (SINEMET IR) 25-100 MG tablet Take 1 tablet at 8am, 2 tablets at 12pm, 1 tablet at 4pm     cholecalciferol (VITAMIN D) 1000 units tablet Take 1,000 Units by mouth daily.      cycloSPORINE (RESTASIS) 0.05 % ophthalmic emulsion Place 1 drop into both eyes 2 (two)  times daily.     docusate sodium (COLACE) 100 MG capsule Take 1 capsule (100 mg total) by mouth daily.     Fiber Complete TABS Take 2 tablets by mouth daily.     gabapentin (NEURONTIN) 100 MG capsule Take 100 mg by mouth at bedtime.     linaclotide (LINZESS) 290 MCG CAPS capsule Take 1 capsule (290 mcg total) by mouth daily as needed (constipation).     Multiple Vitamins-Minerals (MULTIVITAMIN ADULT PO) Take 1 tablet by mouth daily.     rivastigmine (EXELON) 1.5 MG capsule Take 1.5 mg by mouth 2 (two) times daily.     lisinopril (ZESTRIL) 20 MG tablet TAKE 1 TABLET DAILY 90 tablet 0   amLODipine (NORVASC) 5 MG tablet Take 1 tablet (5 mg total) by mouth at bedtime. (Patient not taking: Reported on 01/27/2022) 90 tablet 3   lamoTRIgine (LAMICTAL) 25 MG tablet Take 1 tablet (25 mg total) by mouth in the morning AND 1 tablet (25 mg total) at bedtime. (Patient not taking: Reported on 11/01/2021)     simvastatin (ZOCOR) 40 MG tablet TAKE 1 TABLET DAILY AT 6 P.M. (Patient not taking: Reported on 01/27/2022) 90 tablet 3   No facility-administered medications prior to visit.     Per HPI unless specifically indicated in ROS section below Review of Systems  Objective:  BP 122/62   Pulse (!) 58   Temp 97.7 F (36.5 C) (Temporal)   Ht '5\' 10"'$  (1.778 m)   Wt 203 lb 6 oz (92.3 kg)   SpO2 96%   BMI 29.18 kg/m   Wt Readings from Last 3 Encounters:  01/27/22 203 lb 6 oz (92.3 kg)  01/27/22 208 lb (94.3 kg)  08/12/21 208 lb 6 oz (94.5 kg)      Physical Exam Vitals and nursing note reviewed.  Constitutional:      General: He is not in acute distress.    Appearance: Normal appearance. He is well-developed. He is obese. He is not ill-appearing.     Comments: Unsteady gait with cane  HENT:     Head: Normocephalic and atraumatic.     Right Ear: Hearing, tympanic membrane, ear canal and external ear normal.     Left Ear: Hearing, tympanic membrane, ear canal and external ear normal.  Eyes:      General: No scleral icterus.    Extraocular Movements: Extraocular movements intact.     Conjunctiva/sclera: Conjunctivae normal.     Pupils: Pupils are equal, round, and reactive to light.  Neck:     Thyroid: No thyroid mass or thyromegaly.  Cardiovascular:     Rate and Rhythm: Normal rate and regular rhythm.     Pulses: Normal pulses.          Radial pulses are 2+ on the right side and 2+ on the left side.     Heart sounds:  Normal heart sounds. No murmur heard. Pulmonary:     Effort: Pulmonary effort is normal. No respiratory distress.     Breath sounds: Normal breath sounds. No wheezing, rhonchi or rales.  Abdominal:     General: Bowel sounds are normal. There is no distension.     Palpations: Abdomen is soft. There is no mass.     Tenderness: There is no abdominal tenderness. There is no guarding or rebound.     Hernia: No hernia is present.  Musculoskeletal:        General: Normal range of motion.     Cervical back: Normal range of motion and neck supple.     Right lower leg: No edema.     Left lower leg: No edema.  Lymphadenopathy:     Cervical: No cervical adenopathy.  Skin:    General: Skin is warm and dry.     Findings: No rash.  Neurological:     General: No focal deficit present.     Mental Status: He is alert and oriented to person, place, and time.  Psychiatric:        Mood and Affect: Mood normal.        Behavior: Behavior normal.        Thought Content: Thought content normal.        Judgment: Judgment normal.       Results for orders placed or performed in visit on 01/20/22  Hemoglobin A1c  Result Value Ref Range   Hgb A1c MFr Bld 5.5 4.6 - 6.5 %  Comprehensive metabolic panel  Result Value Ref Range   Sodium 142 135 - 145 mEq/L   Potassium 4.0 3.5 - 5.1 mEq/L   Chloride 108 96 - 112 mEq/L   CO2 33 (H) 19 - 32 mEq/L   Glucose, Bld 95 70 - 99 mg/dL   BUN 16 6 - 23 mg/dL   Creatinine, Ser 0.90 0.40 - 1.50 mg/dL   Total Bilirubin 0.8 0.2 - 1.2 mg/dL    Alkaline Phosphatase 42 39 - 117 U/L   AST 15 0 - 37 U/L   ALT 6 0 - 53 U/L   Total Protein 6.3 6.0 - 8.3 g/dL   Albumin 3.8 3.5 - 5.2 g/dL   GFR 77.91 >60.00 mL/min   Calcium 9.9 8.4 - 10.5 mg/dL  Lipid panel  Result Value Ref Range   Cholesterol 171 0 - 200 mg/dL   Triglycerides 162.0 (H) 0.0 - 149.0 mg/dL   HDL 36.70 (L) >39.00 mg/dL   VLDL 32.4 0.0 - 40.0 mg/dL   LDL Cholesterol 102 (H) 0 - 99 mg/dL   Total CHOL/HDL Ratio 5    NonHDL 134.22     Assessment & Plan:   Problem List Items Addressed This Visit     Advanced care planning/counseling discussion - Primary (Chronic)    Advanced directives - scanned 01/2016. No life prolonging measures if terminal irreversible condition. Separate HCPOA form filled out - Joshua Everett wife then Brier Reid are [place]. Scanned 02/2017. Will check at home to ensure no update is needed.       BPH without obstruction/lower urinary tract symptoms    Joshua Everett was most effective but not covered by insurance.      Chronic constipation    Reviewed bowel regimen- continue regular Metamucil Gummies, MiraLAX (may have not been using recently) and Linzess as needed.  Update if ongoing trouble despite this regimen.      Relevant Medications   polyethylene glycol powder (  GLYCOLAX/MIRALAX) 17 GM/SCOOP powder   Dyslipidemia    Fibrate stopped March of this year.  He was also on simvastatin, cardiology has since stopped statin.  Will remain off all cholesterol medication at this time.      GERD (gastroesophageal reflux disease)    Stable time off medication.      Relevant Medications   linaclotide (LINZESS) 290 MCG CAPS capsule   polyethylene glycol powder (GLYCOLAX/MIRALAX) 17 GM/SCOOP powder   Essential hypertension    Most recently amlodipine was discontinued, has done well off this medication.  In history of autonomic instability, will remain off amlodipine.  We will treat based on standing blood pressures.      Relevant Medications    lisinopril (ZESTRIL) 20 MG tablet   Prediabetes    A1c remains in normal range.      Abnormal gait    Prescription provided for transport chair given increasing difficulty with ambulation and ongoing fall risk.      Parkinson disease [facility])    Appreciate neurology care, continue Sinemet, rivastigmine, now also on gabapentin 100 mg at nighttime.      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 Continue to monitor      Relevant Medications   lisinopril (ZESTRIL) 20 MG tablet     Meds ordered this encounter  Medications   lisinopril (ZESTRIL) 20 MG tablet    Sig: Take 1 tablet (20 mg total) by mouth daily.    Dispense:  90 tablet    Refill:  3   polyethylene glycol powder (GLYCOLAX/MIRALAX) 17 GM/SCOOP powder    Sig: Take 17 g by mouth daily as needed for moderate constipation.   No orders of the defined types were placed in this encounter.    Patient instructions: Stay off amlodipine and simvastatin.  If interested, check with pharmacy about new 2 shot shingles series (shingrix).  Work on regular bowel regimen ie miralax, metamucil, water and fiber in diet, and as needed linzess.  Return in 6 months for follow up visit  Follow up plan: Return in about 6 months (around 07/30/2022) for follow up visit.  Ria Bush, MD

## 2022-01-27 NOTE — Progress Notes (Signed)
Virtual Visit via Telephone Note  I connected with  Joshua Everett on 01/27/22 at 10:45 AM EDT by telephone and verified that I am speaking with the correct person using two identifiers.  Location: Patient: home Provider: [place] Persons participating in the virtual visit: [place]   I discussed the limitations, risks, security and privacy concerns of performing an evaluation and management service by telephone and the availability of in person appointments. The patient expressed understanding and agreed to proceed.  Interactive audio and video telecommunications were attempted between this nurse and patient, however failed, due to patient having technical difficulties OR patient did not have access to video capability.  We continued and completed visit with audio only.  Some vital signs may be absent or patient reported.   Dionisio David, LPN  Subjective:   Joshua Everett is a 87 y.o. male who presents for Medicare Annual/Subsequent preventive examination.  Review of Systems           Objective:    There were no vitals filed for this visit. There is no height or weight on file to calculate BMI.     12/30/2020    3:08 PM 06/15/2020   10:43 AM 01/16/2020    9:30 AM 01/13/2019    2:06 PM 12/26/2017    9:32 AM 12/20/2016    8:55 AM 08/17/2016    2:00 PM  Advanced Directives  Does Patient Have a Medical Advance Directive? No No Yes Yes Yes Yes Yes  Type of Comptroller;Living will Living will;Healthcare Power of [place];Living will [place];Living will Living will;Healthcare Power of Attorney  Does patient want to make changes to medical advance directive?       No - Patient declined  Copy of [place] in Chart?   No - copy requested  No - copy requested Yes No - copy requested  Would patient like information on creating a medical advance  directive? No - Guardian declined          Current Medications (verified) Outpatient Encounter Medications as of 01/27/2022  Medication Sig   bimatoprost (LUMIGAN) 0.01 % SOLN Place 1 drop into both eyes at bedtime.    carbidopa-levodopa (SINEMET IR) 25-100 MG tablet Take 1 tablet at 8am, 2 tablets at 12pm, 1 tablet at 4pm   cholecalciferol (VITAMIN D) 1000 units tablet Take 1,000 Units by mouth daily.    cycloSPORINE (RESTASIS) 0.05 % ophthalmic emulsion Place 1 drop into both eyes 2 (two) times daily.   docusate sodium (COLACE) 100 MG capsule Take 1 capsule (100 mg total) by mouth daily.   Fiber Complete TABS Take 2 tablets by mouth daily.   gabapentin (NEURONTIN) 100 MG capsule Take 100 mg by mouth at bedtime.   lisinopril (ZESTRIL) 20 MG tablet TAKE 1 TABLET DAILY   Multiple Vitamins-Minerals (MULTIVITAMIN ADULT PO) Take 1 tablet by mouth daily.   rivastigmine (EXELON) 1.5 MG capsule Take 1.5 mg by mouth 2 (two) times daily.   amLODipine (NORVASC) 5 MG tablet Take 1 tablet (5 mg total) by mouth at bedtime. (Patient not taking: Reported on 01/27/2022)   lamoTRIgine (LAMICTAL) 25 MG tablet Take 1 tablet (25 mg total) by mouth in the morning AND 1 tablet (25 mg total) at bedtime. (Patient not taking: Reported on 11/01/2021)   simvastatin (ZOCOR) 40 MG tablet TAKE 1 TABLET DAILY AT 6 P.M. (Patient not taking: Reported  on 01/27/2022)   No facility-administered encounter medications on file as of 01/27/2022.    Allergies (verified) Solifenacin   History: Past Medical History:  Diagnosis Date   Actinic keratosis    Basal cell carcinoma 09/03/2014   right of midline forehead   Basal cell carcinoma 03/25/2019   right lower nasal dorsum, EDC   Basal cell carcinoma 03/30/2020   Mid nasal tip, EDC   Basal cell carcinoma (BCC) 01/28/2015   right proximal nasal alar rim   BPH without obstruction/lower urinary tract symptoms 09/08/2014   Chronic constipation 09/08/2014   GERD (gastroesophageal  reflux disease)    Glaucoma    History of chicken pox    Hyperlipidemia    Obesity, Class I, BMI 30-34.9 09/08/2014   Prediabetes 07/31/2015   Rosacea 07/06/2015   Squamous cell carcinoma of skin 01/30/2007   mid back, in situ   Squamous cell carcinoma of skin 03/12/2018   right forearm, in situ   Squamous cell carcinoma of skin 04/22/2018   left pretibial   Squamous cell carcinoma of skin 09/24/2018   right hand dorsum, in situ   Urine incontinence    Past Surgical History:  Procedure Laterality Date   COLONOSCOPY  03/2009   1 TA, diverticulosis, rpt 5 yrs (Dr [facility])   ESOPHAGOGASTRODUODENOSCOPY  03/2009   small HH, gastric polyps biopsied, dilation for dysphagia ([facility] [place])   [place]   Family History  Problem Relation Age of Onset   Diabetes Mother    Alzheimer's disease Mother 45   Kidney disease Father    Cancer Father        brain tumor   Cancer Sister        breast   Social History   Socioeconomic History   Marital status: Married    Spouse name: Not on file   Number of children: Not on file   Years of education: Not on file   Highest education level: Not on file  Occupational History   Not on file  Tobacco Use   Smoking status: Former    Types: Cigarettes    Quit date: 06/05/1968    Years since quitting: 53.6   Smokeless tobacco: Never  Substance and Sexual Activity   Alcohol use: Not Currently    Alcohol/week: 0.0 standard drinks of alcohol   Drug use: No   Sexual activity: Never  Other Topics Concern   Not on file  Social History Narrative   Lives with wife   Occ: Nature conservation officer, retired   Edu: MBA [employer]   Activity: golfs regularly   Diet: good water, fruits/vegetables daily   Social Determinants of Health   Financial Resource Strain: Low Risk  (01/16/2020)   Overall Financial Resource Strain (CARDIA)    Difficulty of Paying Living Expenses: Not hard at all  Food Insecurity: No Food Insecurity  (01/16/2020)   Hunger Vital Sign    Worried About Running Out of Food in the Last Year: Never true    Ran Out of Food in the Last Year: Never true  Transportation Needs: No Transportation Needs (01/16/2020)   PRAPARE - Hydrologist (Medical): No    Lack of Transportation (Non-Medical): No  Physical Activity: Insufficiently Active (01/27/2022)   Exercise Vital Sign    Days of Exercise per Week: 2 days    Minutes of Exercise per Session: 20 min  Stress: No Stress Concern Present (01/27/2022)   [place]  [employer] - Occupational Stress Questionnaire    Feeling of Stress : Only a little  Social Connections: Not on file    Tobacco Counseling Counseling given: Not Answered   Clinical Intake:  Pre-visit preparation completed: Yes  Pain : No/denies pain     Nutritional Risks: None Diabetes: No  How often do you need to have someone help you when you read instructions, pamphlets, or other written materials from your doctor or pharmacy?: 1 - Never  Diabetic?no  Interpreter Needed?: No  Information entered by :: Kirke Shaggy, LPN   Activities of Daily Living     No data to display          Patient Care Team: Ria Bush, MD as PCP - General (Family Medicine) Leandrew Koyanagi, MD as Referring Physician (Ophthalmology) Lucilla Lame, MD as Consulting Physician (Gastroenterology) Franchot Gallo, MD as Consulting Physician (Urology) Ralene Bathe, MD as Consulting Physician (Dermatology)  Indicate any recent Medical Services you may have received from other than [facility] providers in the past year (date may be approximate).     Assessment:   This is a routine wellness examination for Joshua Everett.  Hearing/Vision screen No results found.  Dietary issues and exercise activities discussed:     Goals Addressed             This Visit's Progress    DIET - EAT MORE FRUITS AND VEGETABLES         Depression  Screen    01/27/2022   10:44 AM 01/26/2021    2:43 PM 01/16/2020    9:31 AM 01/13/2019   10:39 AM 12/26/2017    9:30 AM 12/20/2016    8:55 AM 12/20/2015    2:28 PM  PHQ 2/9 Scores  PHQ - 2 Score 1 0 0 0 0 0 0  PHQ- 9 Score 1  0  0      Fall Risk    01/26/2021    2:43 PM 01/16/2020    9:31 AM 01/13/2019   10:39 AM 12/26/2017    9:30 AM 12/20/2016    8:55 AM  Fall Risk   Falls in the past year? 0 0 0 No No  Number falls in past yr: 0 0     Injury with Fall? 0 0     Risk for fall due to :  Medication side effect     Follow up  Falls evaluation completed;Falls prevention discussed       FALL RISK PREVENTION PERTAINING TO THE HOME:  Any stairs in or around the home? No  If so, are there any without handrails? No  Home free of loose throw rugs in walkways, pet beds, electrical cords, etc? Yes  Adequate lighting in your home to reduce risk of falls? Yes   ASSISTIVE DEVICES UTILIZED TO PREVENT FALLS:  Life alert? No  Use of a cane, walker or w/c? Yes  Grab bars in the bathroom? Yes  Shower chair or bench in shower? Yes  Elevated toilet seat or a handicapped toilet? No    Cognitive Function:declined PT is A & O      01/16/2020    9:38 AM 12/26/2017    9:26 AM 12/20/2016    8:56 AM 11/11/2015    2:30 PM  MMSE - Mini Mental State Exam  Orientation to time '5 5 5 5  '$ Orientation to Place '5 5 5 5  '$ Registration '3 3 3 3  '$ Attention/ Calculation 5 0 0 0  Recall  $'3 3 2 3  's$ Language- name 2 objects  0 0 0  Language- repeat '1 1 1 1  '$ Language- follow 3 step command  '3 3 3  '$ Language- read & follow direction  0 0 0  Write a sentence  0 0 0  Copy design  0 0 0  Total score  '20 19 20        '$ Immunizations Immunization History  Administered Date(s) Administered   Influenza-Unspecified 03/23/2014   PFIZER(Purple Top)SARS-COV-2 Vaccination 07/31/2019, 08/27/2019, 04/12/2020   Pneumococcal Conjugate-13 03/23/2014   Pneumococcal Polysaccharide-23 06/06/2007   Tdap 07/21/2013   Zoster,  Live 05/04/2013    TDAP status: Up to date  Flu Vaccine status: Declined, Education has been provided regarding the importance of this vaccine but patient still declined. Advised may receive this vaccine at local pharmacy or Health Dept. Aware to provide a copy of the vaccination record if obtained from local pharmacy or Health Dept. Verbalized acceptance and understanding.  Pneumococcal vaccine status: Up to date  Covid-19 vaccine status: Completed vaccines  Qualifies for Shingles Vaccine? Yes   Zostavax completed Yes   Shingrix Completed?: No.    Education has been provided regarding the importance of this vaccine. Patient has been advised to call insurance company to determine out of pocket expense if they have not yet received this vaccine. Advised may also receive vaccine at local pharmacy or Health Dept. Verbalized acceptance and understanding.  Screening Tests Health Maintenance  Topic Date Due   Zoster Vaccines- Shingrix (1 of 2) Never done   COVID-19 Vaccine (4 - Pfizer risk series) 06/07/2020   INFLUENZA VACCINE  01/03/2022   TETANUS/TDAP  07/22/2023   Pneumonia Vaccine 53+ Years old  Completed   HPV VACCINES  Aged Out    Health Maintenance  Health Maintenance Due  Topic Date Due   Zoster Vaccines- Shingrix (1 of 2) Never done   COVID-19 Vaccine (4 - Pfizer risk series) 06/07/2020   INFLUENZA VACCINE  01/03/2022    Colorectal cancer screening: No longer required.   Lung Cancer Screening: (Low Dose CT Chest recommended if Age 58-80 years, 30 pack-year currently smoking OR have quit w/in 15years.) does not qualify.    Additional Screening:  Hepatitis C Screening: does not qualify; Completed no  Vision Screening: Recommended annual ophthalmology exams for early detection of glaucoma and other disorders of the eye. Is the patient up to date with their annual eye exam?  Yes  Who is the provider or what is the name of the office in which the patient attends annual  eye exams? [place] If pt is not established with a provider, would they like to be referred to a provider to establish care? No .   Dental Screening: Recommended annual dental exams for proper oral hygiene  Community Resource Referral / Chronic Care Management: CRR required this visit?  No   CCM required this visit?  No      Plan:     I have personally reviewed and noted the following in the patient's chart:   Medical and social history Use of alcohol, tobacco or illicit drugs  Current medications and supplements including opioid prescriptions. Patient is not currently taking opioid prescriptions. Functional ability and status Nutritional status Physical activity Advanced directives List of other physicians Hospitalizations, surgeries, and ER visits in previous 12 months Vitals Screenings to include cognitive, depression, and falls Referrals and appointments  In addition, I have reviewed and discussed with patient certain preventive protocols, quality metrics,  and best practice recommendations. A written personalized care plan for preventive services as well as general preventive health recommendations were provided to patient.     Dionisio David, LPN   [id number]   Nurse Notes: none

## 2022-01-27 NOTE — Assessment & Plan Note (Signed)
Most recently amlodipine was discontinued, has done well off this medication.  In history of autonomic instability, will remain off amlodipine.  We will treat based on standing blood pressures.

## 2022-01-27 NOTE — Assessment & Plan Note (Signed)
Reviewed bowel regimen- continue regular Metamucil Gummies, MiraLAX (may have not been using recently) and Linzess as needed.  Update if ongoing trouble despite this regimen.

## 2022-01-27 NOTE — Assessment & Plan Note (Signed)
Appreciate neurology care, continue Sinemet, rivastigmine, now also on gabapentin 100 mg at nighttime.

## 2022-01-27 NOTE — Assessment & Plan Note (Signed)
A1c remains in normal range.

## 2022-01-27 NOTE — Assessment & Plan Note (Signed)
Advanced directives - scanned 01/2016. No life prolonging measures if terminal irreversible condition. Separate HCPOA form filled out - [name] wife then Deontre Allsup are [place]. Scanned 02/2017. Will check at home to ensure no update is needed.

## 2022-01-27 NOTE — Assessment & Plan Note (Signed)
Prescription provided for transport chair given increasing difficulty with ambulation and ongoing fall risk.

## 2022-01-27 NOTE — Patient Instructions (Signed)
Joshua Everett , Thank you for taking time to come for your Medicare Wellness Visit. I appreciate your ongoing commitment to your health goals. Please review the following plan we discussed and let me know if I can assist you in the future.   Screening recommendations/referrals: Colonoscopy: aged out Recommended yearly ophthalmology/optometry visit for glaucoma screening and checkup Recommended yearly dental visit for hygiene and checkup  Vaccinations: Influenza vaccine: n/d Pneumococcal vaccine: 03/23/14 Tdap vaccine: 07/21/13 Shingles vaccine: Zostavax 05/04/13   Covid-19: 2/25/1, 08/27/19, 04/12/20  Advanced directives: no  Conditions/risks identified: none  Next appointment: Follow up in one year for your annual wellness visit. 01/30/23 @ 11 am by phone  Preventive Care 85 Years and Older, Male Preventive care refers to lifestyle choices and visits with your health care provider that can promote health and wellness. What does preventive care include? A yearly physical exam. This is also called an annual well check. Dental exams once or twice a year. Routine eye exams. Ask your health care provider how often you should have your eyes checked. Personal lifestyle choices, including: Daily care of your teeth and gums. Regular physical activity. Eating a healthy diet. Avoiding tobacco and drug use. Limiting alcohol use. Practicing safe sex. Taking low doses of aspirin every day. Taking vitamin and mineral supplements as recommended by your health care provider. What happens during an annual well check? The services and screenings done by your health care provider during your annual well check will depend on your age, overall health, lifestyle risk factors, and family history of disease. Counseling  Your health care provider may ask you questions about your: Alcohol use. Tobacco use. Drug use. Emotional well-being. Home and relationship well-being. Sexual activity. Eating  habits. History of falls. Memory and ability to understand (cognition). Work and work Statistician. Screening  You may have the following tests or measurements: Height, weight, and BMI. Blood pressure. Lipid and cholesterol levels. These may be checked every 5 years, or more frequently if you are over 55 years old. Skin check. Lung cancer screening. You may have this screening every year starting at age 85 if you have a 30-pack-year history of smoking and currently smoke or have quit within the past 15 years. Fecal occult blood test (FOBT) of the stool. You may have this test every year starting at age 85. Flexible sigmoidoscopy or colonoscopy. You may have a sigmoidoscopy every 5 years or a colonoscopy every 10 years starting at age 85. Prostate cancer screening. Recommendations will vary depending on your family history and other risks. Hepatitis C blood test. Hepatitis B blood test. Sexually transmitted disease (STD) testing. Diabetes screening. This is done by checking your blood sugar (glucose) after you have not eaten for a while (fasting). You may have this done every 1-3 years. Abdominal aortic aneurysm (AAA) screening. You may need this if you are a current or former smoker. Osteoporosis. You may be screened starting at age 85 if you are at high risk. Talk with your health care provider about your test results, treatment options, and if necessary, the need for more tests. Vaccines  Your health care provider may recommend certain vaccines, such as: Influenza vaccine. This is recommended every year. Tetanus, diphtheria, and acellular pertussis (Tdap, Td) vaccine. You may need a Td booster every 10 years. Zoster vaccine. You may need this after age 85. Pneumococcal 13-valent conjugate (PCV13) vaccine. One dose is recommended after age 85. Pneumococcal polysaccharide (PPSV23) vaccine. One dose is recommended after age 85. Talk to your  health care provider about which screenings and  vaccines you need and how often you need them. This information is not intended to replace advice given to you by your health care provider. Make sure you discuss any questions you have with your health care provider. Document Released: 06/18/2015 Document Revised: 02/09/2016 Document Reviewed: 03/23/2015 Elsevier Interactive Patient Education  2017 [place] Prevention in the Home Falls can cause injuries. They can happen to people of all ages. There are many things you can do to make your home safe and to help prevent falls. What can I do on the outside of my home? Regularly fix the edges of walkways and driveways and fix any cracks. Remove anything that might make you trip as you walk through a door, such as a raised step or threshold. Trim any bushes or trees on the path to your home. Use bright outdoor lighting. Clear any walking paths of anything that might make someone trip, such as rocks or tools. Regularly check to see if handrails are loose or broken. Make sure that both sides of any steps have handrails. Any raised decks and porches should have guardrails on the edges. Have any leaves, snow, or ice cleared regularly. Use sand or salt on walking paths during winter. Clean up any spills in your garage right away. This includes oil or grease spills. What can I do in the bathroom? Use night lights. Install grab bars by the toilet and in the tub and shower. Do not use towel bars as grab bars. Use non-skid mats or decals in the tub or shower. If you need to sit down in the shower, use a plastic, non-slip stool. Keep the floor dry. Clean up any water that spills on the floor as soon as it happens. Remove soap buildup in the tub or shower regularly. Attach bath mats securely with double-sided non-slip rug tape. Do not have throw rugs and other things on the floor that can make you trip. What can I do in the bedroom? Use night lights. Make sure that you have a light by your  bed that is easy to reach. Do not use any sheets or blankets that are too big for your bed. They should not hang down onto the floor. Have a firm chair that has side arms. You can use this for support while you get dressed. Do not have throw rugs and other things on the floor that can make you trip. What can I do in the kitchen? Clean up any spills right away. Avoid walking on wet floors. Keep items that you use a lot in easy-to-reach places. If you need to reach something above you, use a strong step stool that has a grab bar. Keep electrical cords out of the way. Do not use floor polish or wax that makes floors slippery. If you must use wax, use non-skid floor wax. Do not have throw rugs and other things on the floor that can make you trip. What can I do with my stairs? Do not leave any items on the stairs. Make sure that there are handrails on both sides of the stairs and use them. Fix handrails that are broken or loose. Make sure that handrails are as long as the stairways. Check any carpeting to make sure that it is firmly attached to the stairs. Fix any carpet that is loose or worn. Avoid having throw rugs at the top or bottom of the stairs. If you do have throw rugs, attach them  to the floor with carpet tape. Make sure that you have a light switch at the top of the stairs and the bottom of the stairs. If you do not have them, ask someone to add them for you. What else can I do to help prevent falls? Wear shoes that: Do not have high heels. Have rubber bottoms. Are comfortable and fit you well. Are closed at the toe. Do not wear sandals. If you use a stepladder: Make sure that it is fully opened. Do not climb a closed stepladder. Make sure that both sides of the stepladder are locked into place. Ask someone to hold it for you, if possible. Clearly mark and make sure that you can see: Any grab bars or handrails. First and last steps. Where the edge of each step is. Use tools that  help you move around (mobility aids) if they are needed. These include: Canes. Walkers. Scooters. Crutches. Turn on the lights when you go into a dark area. Replace any light bulbs as soon as they burn out. Set up your furniture so you have a clear path. Avoid moving your furniture around. If any of your floors are uneven, fix them. If there are any pets around you, be aware of where they are. Review your medicines with your doctor. Some medicines can make you feel dizzy. This can increase your chance of falling. Ask your doctor what other things that you can do to help prevent falls. This information is not intended to replace advice given to you by your health care provider. Make sure you discuss any questions you have with your health care provider. Document Released: 03/18/2009 Document Revised: 10/28/2015 Document Reviewed: 06/26/2014 Elsevier Interactive Patient Education  2017 [employer].

## 2022-01-27 NOTE — Assessment & Plan Note (Signed)
Stable time off medication.

## 2022-01-27 NOTE — Assessment & Plan Note (Signed)
Joshua Everett was most effective but not covered by insurance.

## 2022-01-30 DIAGNOSIS — M1990 Unspecified osteoarthritis, unspecified site: Secondary | ICD-10-CM | POA: Diagnosis not present

## 2022-01-30 DIAGNOSIS — G20 Parkinson's disease: Secondary | ICD-10-CM | POA: Diagnosis not present

## 2022-01-30 DIAGNOSIS — R296 Repeated falls: Secondary | ICD-10-CM | POA: Diagnosis not present

## 2022-01-30 DIAGNOSIS — F0282 Dementia in other diseases classified elsewhere, unspecified severity, with psychotic disturbance: Secondary | ICD-10-CM | POA: Diagnosis not present

## 2022-01-30 DIAGNOSIS — I10 Essential (primary) hypertension: Secondary | ICD-10-CM | POA: Diagnosis not present

## 2022-01-30 DIAGNOSIS — F02818 Dementia in other diseases classified elsewhere, unspecified severity, with other behavioral disturbance: Secondary | ICD-10-CM | POA: Diagnosis not present

## 2022-02-01 DIAGNOSIS — E785 Hyperlipidemia, unspecified: Secondary | ICD-10-CM | POA: Diagnosis not present

## 2022-02-01 DIAGNOSIS — R7303 Prediabetes: Secondary | ICD-10-CM | POA: Diagnosis not present

## 2022-02-01 DIAGNOSIS — Z6828 Body mass index (BMI) 28.0-28.9, adult: Secondary | ICD-10-CM | POA: Diagnosis not present

## 2022-02-01 DIAGNOSIS — F0282 Dementia in other diseases classified elsewhere, unspecified severity, with psychotic disturbance: Secondary | ICD-10-CM | POA: Diagnosis not present

## 2022-02-01 DIAGNOSIS — N3281 Overactive bladder: Secondary | ICD-10-CM | POA: Diagnosis not present

## 2022-02-01 DIAGNOSIS — G4752 REM sleep behavior disorder: Secondary | ICD-10-CM | POA: Diagnosis not present

## 2022-02-01 DIAGNOSIS — R634 Abnormal weight loss: Secondary | ICD-10-CM | POA: Diagnosis not present

## 2022-02-01 DIAGNOSIS — H409 Unspecified glaucoma: Secondary | ICD-10-CM | POA: Diagnosis not present

## 2022-02-01 DIAGNOSIS — K219 Gastro-esophageal reflux disease without esophagitis: Secondary | ICD-10-CM | POA: Diagnosis not present

## 2022-02-01 DIAGNOSIS — I10 Essential (primary) hypertension: Secondary | ICD-10-CM | POA: Diagnosis not present

## 2022-02-01 DIAGNOSIS — F02818 Dementia in other diseases classified elsewhere, unspecified severity, with other behavioral disturbance: Secondary | ICD-10-CM | POA: Diagnosis not present

## 2022-02-01 DIAGNOSIS — M1990 Unspecified osteoarthritis, unspecified site: Secondary | ICD-10-CM | POA: Diagnosis not present

## 2022-02-01 DIAGNOSIS — G20 Parkinson's disease: Secondary | ICD-10-CM | POA: Diagnosis not present

## 2022-02-01 DIAGNOSIS — Z87891 Personal history of nicotine dependence: Secondary | ICD-10-CM | POA: Diagnosis not present

## 2022-02-01 DIAGNOSIS — R32 Unspecified urinary incontinence: Secondary | ICD-10-CM | POA: Diagnosis not present

## 2022-02-01 DIAGNOSIS — K5900 Constipation, unspecified: Secondary | ICD-10-CM | POA: Diagnosis not present

## 2022-02-01 DIAGNOSIS — R296 Repeated falls: Secondary | ICD-10-CM | POA: Diagnosis not present

## 2022-02-02 DIAGNOSIS — F0282 Dementia in other diseases classified elsewhere, unspecified severity, with psychotic disturbance: Secondary | ICD-10-CM | POA: Diagnosis not present

## 2022-02-02 DIAGNOSIS — I10 Essential (primary) hypertension: Secondary | ICD-10-CM | POA: Diagnosis not present

## 2022-02-02 DIAGNOSIS — G20 Parkinson's disease: Secondary | ICD-10-CM | POA: Diagnosis not present

## 2022-02-02 DIAGNOSIS — H401131 Primary open-angle glaucoma, bilateral, mild stage: Secondary | ICD-10-CM | POA: Diagnosis not present

## 2022-02-02 DIAGNOSIS — R296 Repeated falls: Secondary | ICD-10-CM | POA: Diagnosis not present

## 2022-02-02 DIAGNOSIS — M1990 Unspecified osteoarthritis, unspecified site: Secondary | ICD-10-CM | POA: Diagnosis not present

## 2022-02-02 DIAGNOSIS — K219 Gastro-esophageal reflux disease without esophagitis: Secondary | ICD-10-CM | POA: Diagnosis not present

## 2022-02-02 DIAGNOSIS — F02818 Dementia in other diseases classified elsewhere, unspecified severity, with other behavioral disturbance: Secondary | ICD-10-CM | POA: Diagnosis not present

## 2022-02-08 DIAGNOSIS — R296 Repeated falls: Secondary | ICD-10-CM | POA: Diagnosis not present

## 2022-02-08 DIAGNOSIS — F0282 Dementia in other diseases classified elsewhere, unspecified severity, with psychotic disturbance: Secondary | ICD-10-CM | POA: Diagnosis not present

## 2022-02-08 DIAGNOSIS — I10 Essential (primary) hypertension: Secondary | ICD-10-CM | POA: Diagnosis not present

## 2022-02-08 DIAGNOSIS — F02818 Dementia in other diseases classified elsewhere, unspecified severity, with other behavioral disturbance: Secondary | ICD-10-CM | POA: Diagnosis not present

## 2022-02-08 DIAGNOSIS — G20 Parkinson's disease: Secondary | ICD-10-CM | POA: Diagnosis not present

## 2022-02-08 DIAGNOSIS — M1990 Unspecified osteoarthritis, unspecified site: Secondary | ICD-10-CM | POA: Diagnosis not present

## 2022-02-13 DIAGNOSIS — F0282 Dementia in other diseases classified elsewhere, unspecified severity, with psychotic disturbance: Secondary | ICD-10-CM | POA: Diagnosis not present

## 2022-02-13 DIAGNOSIS — R296 Repeated falls: Secondary | ICD-10-CM | POA: Diagnosis not present

## 2022-02-13 DIAGNOSIS — F02818 Dementia in other diseases classified elsewhere, unspecified severity, with other behavioral disturbance: Secondary | ICD-10-CM | POA: Diagnosis not present

## 2022-02-13 DIAGNOSIS — G20 Parkinson's disease: Secondary | ICD-10-CM | POA: Diagnosis not present

## 2022-02-13 DIAGNOSIS — I10 Essential (primary) hypertension: Secondary | ICD-10-CM | POA: Diagnosis not present

## 2022-02-13 DIAGNOSIS — M1990 Unspecified osteoarthritis, unspecified site: Secondary | ICD-10-CM | POA: Diagnosis not present

## 2022-02-16 DIAGNOSIS — F0280 Dementia in other diseases classified elsewhere without behavioral disturbance: Secondary | ICD-10-CM | POA: Diagnosis not present

## 2022-02-16 DIAGNOSIS — G20 Parkinson's disease: Secondary | ICD-10-CM | POA: Diagnosis not present

## 2022-02-16 DIAGNOSIS — R296 Repeated falls: Secondary | ICD-10-CM | POA: Diagnosis not present

## 2022-02-16 DIAGNOSIS — G4752 REM sleep behavior disorder: Secondary | ICD-10-CM | POA: Diagnosis not present

## 2022-02-16 DIAGNOSIS — R569 Unspecified convulsions: Secondary | ICD-10-CM | POA: Diagnosis not present

## 2022-02-21 DIAGNOSIS — F02818 Dementia in other diseases classified elsewhere, unspecified severity, with other behavioral disturbance: Secondary | ICD-10-CM | POA: Diagnosis not present

## 2022-02-21 DIAGNOSIS — I10 Essential (primary) hypertension: Secondary | ICD-10-CM | POA: Diagnosis not present

## 2022-02-21 DIAGNOSIS — M1990 Unspecified osteoarthritis, unspecified site: Secondary | ICD-10-CM | POA: Diagnosis not present

## 2022-02-21 DIAGNOSIS — F0282 Dementia in other diseases classified elsewhere, unspecified severity, with psychotic disturbance: Secondary | ICD-10-CM | POA: Diagnosis not present

## 2022-02-21 DIAGNOSIS — G20 Parkinson's disease: Secondary | ICD-10-CM | POA: Diagnosis not present

## 2022-02-21 DIAGNOSIS — R296 Repeated falls: Secondary | ICD-10-CM | POA: Diagnosis not present

## 2022-02-28 DIAGNOSIS — F02818 Dementia in other diseases classified elsewhere, unspecified severity, with other behavioral disturbance: Secondary | ICD-10-CM | POA: Diagnosis not present

## 2022-02-28 DIAGNOSIS — M1990 Unspecified osteoarthritis, unspecified site: Secondary | ICD-10-CM | POA: Diagnosis not present

## 2022-02-28 DIAGNOSIS — I10 Essential (primary) hypertension: Secondary | ICD-10-CM | POA: Diagnosis not present

## 2022-02-28 DIAGNOSIS — G20 Parkinson's disease: Secondary | ICD-10-CM | POA: Diagnosis not present

## 2022-02-28 DIAGNOSIS — R296 Repeated falls: Secondary | ICD-10-CM | POA: Diagnosis not present

## 2022-02-28 DIAGNOSIS — F0282 Dementia in other diseases classified elsewhere, unspecified severity, with psychotic disturbance: Secondary | ICD-10-CM | POA: Diagnosis not present

## 2022-03-03 DIAGNOSIS — N3281 Overactive bladder: Secondary | ICD-10-CM | POA: Diagnosis not present

## 2022-03-03 DIAGNOSIS — R7303 Prediabetes: Secondary | ICD-10-CM | POA: Diagnosis not present

## 2022-03-03 DIAGNOSIS — R634 Abnormal weight loss: Secondary | ICD-10-CM | POA: Diagnosis not present

## 2022-03-03 DIAGNOSIS — Z6828 Body mass index (BMI) 28.0-28.9, adult: Secondary | ICD-10-CM | POA: Diagnosis not present

## 2022-03-03 DIAGNOSIS — K5900 Constipation, unspecified: Secondary | ICD-10-CM | POA: Diagnosis not present

## 2022-03-03 DIAGNOSIS — M1990 Unspecified osteoarthritis, unspecified site: Secondary | ICD-10-CM | POA: Diagnosis not present

## 2022-03-03 DIAGNOSIS — R32 Unspecified urinary incontinence: Secondary | ICD-10-CM | POA: Diagnosis not present

## 2022-03-03 DIAGNOSIS — F02818 Dementia in other diseases classified elsewhere, unspecified severity, with other behavioral disturbance: Secondary | ICD-10-CM | POA: Diagnosis not present

## 2022-03-03 DIAGNOSIS — G20 Parkinson's disease: Secondary | ICD-10-CM | POA: Diagnosis not present

## 2022-03-03 DIAGNOSIS — H409 Unspecified glaucoma: Secondary | ICD-10-CM | POA: Diagnosis not present

## 2022-03-03 DIAGNOSIS — I10 Essential (primary) hypertension: Secondary | ICD-10-CM | POA: Diagnosis not present

## 2022-03-03 DIAGNOSIS — E785 Hyperlipidemia, unspecified: Secondary | ICD-10-CM | POA: Diagnosis not present

## 2022-03-03 DIAGNOSIS — K219 Gastro-esophageal reflux disease without esophagitis: Secondary | ICD-10-CM | POA: Diagnosis not present

## 2022-03-03 DIAGNOSIS — Z87891 Personal history of nicotine dependence: Secondary | ICD-10-CM | POA: Diagnosis not present

## 2022-03-03 DIAGNOSIS — R296 Repeated falls: Secondary | ICD-10-CM | POA: Diagnosis not present

## 2022-03-03 DIAGNOSIS — G4752 REM sleep behavior disorder: Secondary | ICD-10-CM | POA: Diagnosis not present

## 2022-03-03 DIAGNOSIS — F0282 Dementia in other diseases classified elsewhere, unspecified severity, with psychotic disturbance: Secondary | ICD-10-CM | POA: Diagnosis not present

## 2022-03-06 DIAGNOSIS — F0282 Dementia in other diseases classified elsewhere, unspecified severity, with psychotic disturbance: Secondary | ICD-10-CM | POA: Diagnosis not present

## 2022-03-06 DIAGNOSIS — M1990 Unspecified osteoarthritis, unspecified site: Secondary | ICD-10-CM | POA: Diagnosis not present

## 2022-03-06 DIAGNOSIS — R296 Repeated falls: Secondary | ICD-10-CM | POA: Diagnosis not present

## 2022-03-06 DIAGNOSIS — F02818 Dementia in other diseases classified elsewhere, unspecified severity, with other behavioral disturbance: Secondary | ICD-10-CM | POA: Diagnosis not present

## 2022-03-06 DIAGNOSIS — I10 Essential (primary) hypertension: Secondary | ICD-10-CM | POA: Diagnosis not present

## 2022-03-06 DIAGNOSIS — G20 Parkinson's disease: Secondary | ICD-10-CM | POA: Diagnosis not present

## 2022-03-14 DIAGNOSIS — F02818 Dementia in other diseases classified elsewhere, unspecified severity, with other behavioral disturbance: Secondary | ICD-10-CM | POA: Diagnosis not present

## 2022-03-14 DIAGNOSIS — I10 Essential (primary) hypertension: Secondary | ICD-10-CM | POA: Diagnosis not present

## 2022-03-14 DIAGNOSIS — M1990 Unspecified osteoarthritis, unspecified site: Secondary | ICD-10-CM | POA: Diagnosis not present

## 2022-03-14 DIAGNOSIS — R296 Repeated falls: Secondary | ICD-10-CM | POA: Diagnosis not present

## 2022-03-14 DIAGNOSIS — G20 Parkinson's disease: Secondary | ICD-10-CM | POA: Diagnosis not present

## 2022-03-14 DIAGNOSIS — F0282 Dementia in other diseases classified elsewhere, unspecified severity, with psychotic disturbance: Secondary | ICD-10-CM | POA: Diagnosis not present

## 2022-03-21 DIAGNOSIS — F02818 Dementia in other diseases classified elsewhere, unspecified severity, with other behavioral disturbance: Secondary | ICD-10-CM | POA: Diagnosis not present

## 2022-03-21 DIAGNOSIS — G20 Parkinson's disease: Secondary | ICD-10-CM | POA: Diagnosis not present

## 2022-03-21 DIAGNOSIS — I10 Essential (primary) hypertension: Secondary | ICD-10-CM | POA: Diagnosis not present

## 2022-03-21 DIAGNOSIS — F0282 Dementia in other diseases classified elsewhere, unspecified severity, with psychotic disturbance: Secondary | ICD-10-CM | POA: Diagnosis not present

## 2022-03-21 DIAGNOSIS — M1990 Unspecified osteoarthritis, unspecified site: Secondary | ICD-10-CM | POA: Diagnosis not present

## 2022-03-21 DIAGNOSIS — R296 Repeated falls: Secondary | ICD-10-CM | POA: Diagnosis not present

## 2022-03-24 DIAGNOSIS — R296 Repeated falls: Secondary | ICD-10-CM | POA: Diagnosis not present

## 2022-03-24 DIAGNOSIS — G20A1 Parkinson's disease without dyskinesia, without mention of fluctuations: Secondary | ICD-10-CM | POA: Diagnosis not present

## 2022-03-24 DIAGNOSIS — F0280 Dementia in other diseases classified elsewhere without behavioral disturbance: Secondary | ICD-10-CM | POA: Diagnosis not present

## 2022-03-24 DIAGNOSIS — G4752 REM sleep behavior disorder: Secondary | ICD-10-CM | POA: Diagnosis not present

## 2022-03-24 DIAGNOSIS — R32 Unspecified urinary incontinence: Secondary | ICD-10-CM | POA: Diagnosis not present

## 2022-03-29 DIAGNOSIS — R296 Repeated falls: Secondary | ICD-10-CM | POA: Diagnosis not present

## 2022-03-29 DIAGNOSIS — F02818 Dementia in other diseases classified elsewhere, unspecified severity, with other behavioral disturbance: Secondary | ICD-10-CM | POA: Diagnosis not present

## 2022-03-29 DIAGNOSIS — M1990 Unspecified osteoarthritis, unspecified site: Secondary | ICD-10-CM | POA: Diagnosis not present

## 2022-03-29 DIAGNOSIS — I10 Essential (primary) hypertension: Secondary | ICD-10-CM | POA: Diagnosis not present

## 2022-03-29 DIAGNOSIS — F0282 Dementia in other diseases classified elsewhere, unspecified severity, with psychotic disturbance: Secondary | ICD-10-CM | POA: Diagnosis not present

## 2022-03-29 DIAGNOSIS — G20 Parkinson's disease: Secondary | ICD-10-CM | POA: Diagnosis not present

## 2022-04-02 DIAGNOSIS — K5900 Constipation, unspecified: Secondary | ICD-10-CM | POA: Diagnosis not present

## 2022-04-02 DIAGNOSIS — K219 Gastro-esophageal reflux disease without esophagitis: Secondary | ICD-10-CM | POA: Diagnosis not present

## 2022-04-02 DIAGNOSIS — G4752 REM sleep behavior disorder: Secondary | ICD-10-CM | POA: Diagnosis not present

## 2022-04-02 DIAGNOSIS — E785 Hyperlipidemia, unspecified: Secondary | ICD-10-CM | POA: Diagnosis not present

## 2022-04-02 DIAGNOSIS — R296 Repeated falls: Secondary | ICD-10-CM | POA: Diagnosis not present

## 2022-04-02 DIAGNOSIS — I10 Essential (primary) hypertension: Secondary | ICD-10-CM | POA: Diagnosis not present

## 2022-04-02 DIAGNOSIS — F02818 Dementia in other diseases classified elsewhere, unspecified severity, with other behavioral disturbance: Secondary | ICD-10-CM | POA: Diagnosis not present

## 2022-04-02 DIAGNOSIS — N3281 Overactive bladder: Secondary | ICD-10-CM | POA: Diagnosis not present

## 2022-04-02 DIAGNOSIS — H409 Unspecified glaucoma: Secondary | ICD-10-CM | POA: Diagnosis not present

## 2022-04-02 DIAGNOSIS — M1990 Unspecified osteoarthritis, unspecified site: Secondary | ICD-10-CM | POA: Diagnosis not present

## 2022-04-02 DIAGNOSIS — G20A1 Parkinson's disease without dyskinesia, without mention of fluctuations: Secondary | ICD-10-CM | POA: Diagnosis not present

## 2022-04-02 DIAGNOSIS — R32 Unspecified urinary incontinence: Secondary | ICD-10-CM | POA: Diagnosis not present

## 2022-04-02 DIAGNOSIS — R7303 Prediabetes: Secondary | ICD-10-CM | POA: Diagnosis not present

## 2022-04-02 DIAGNOSIS — Z87891 Personal history of nicotine dependence: Secondary | ICD-10-CM | POA: Diagnosis not present

## 2022-04-02 DIAGNOSIS — Z6828 Body mass index (BMI) 28.0-28.9, adult: Secondary | ICD-10-CM | POA: Diagnosis not present

## 2022-04-02 DIAGNOSIS — R634 Abnormal weight loss: Secondary | ICD-10-CM | POA: Diagnosis not present

## 2022-04-02 DIAGNOSIS — F0282 Dementia in other diseases classified elsewhere, unspecified severity, with psychotic disturbance: Secondary | ICD-10-CM | POA: Diagnosis not present

## 2022-04-03 ENCOUNTER — Emergency Department: Payer: Medicare Other

## 2022-04-03 ENCOUNTER — Other Ambulatory Visit: Payer: Self-pay

## 2022-04-03 ENCOUNTER — Telehealth: Payer: Self-pay | Admitting: Family Medicine

## 2022-04-03 ENCOUNTER — Emergency Department
Admission: EM | Admit: 2022-04-03 | Discharge: 2022-04-03 | Disposition: A | Discharge status: Home / Self Care | Payer: Medicare Other | Attending: Emergency Medicine | Admitting: Emergency Medicine

## 2022-04-03 DIAGNOSIS — R296 Repeated falls: Secondary | ICD-10-CM | POA: Diagnosis not present

## 2022-04-03 DIAGNOSIS — F0282 Dementia in other diseases classified elsewhere, unspecified severity, with psychotic disturbance: Secondary | ICD-10-CM | POA: Diagnosis not present

## 2022-04-03 DIAGNOSIS — R4182 Altered mental status, unspecified: Secondary | ICD-10-CM | POA: Diagnosis not present

## 2022-04-03 DIAGNOSIS — R404 Transient alteration of awareness: Secondary | ICD-10-CM | POA: Diagnosis not present

## 2022-04-03 DIAGNOSIS — R531 Weakness: Secondary | ICD-10-CM | POA: Diagnosis not present

## 2022-04-03 DIAGNOSIS — I10 Essential (primary) hypertension: Secondary | ICD-10-CM | POA: Diagnosis not present

## 2022-04-03 DIAGNOSIS — R001 Bradycardia, unspecified: Secondary | ICD-10-CM | POA: Diagnosis not present

## 2022-04-03 DIAGNOSIS — J90 Pleural effusion, not elsewhere classified: Secondary | ICD-10-CM | POA: Diagnosis not present

## 2022-04-03 DIAGNOSIS — J9811 Atelectasis: Secondary | ICD-10-CM | POA: Diagnosis not present

## 2022-04-03 DIAGNOSIS — R630 Anorexia: Secondary | ICD-10-CM | POA: Insufficient documentation

## 2022-04-03 DIAGNOSIS — G20A1 Parkinson's disease without dyskinesia, without mention of fluctuations: Secondary | ICD-10-CM | POA: Diagnosis not present

## 2022-04-03 DIAGNOSIS — E860 Dehydration: Secondary | ICD-10-CM | POA: Diagnosis not present

## 2022-04-03 DIAGNOSIS — F02818 Dementia in other diseases classified elsewhere, unspecified severity, with other behavioral disturbance: Secondary | ICD-10-CM | POA: Diagnosis not present

## 2022-04-03 DIAGNOSIS — M1990 Unspecified osteoarthritis, unspecified site: Secondary | ICD-10-CM | POA: Diagnosis not present

## 2022-04-03 LAB — CBC WITH DIFFERENTIAL/PLATELET
Abs Immature Granulocytes: 0.02 10*3/uL (ref 0.00–0.07)
Basophils Absolute: 0.1 10*3/uL (ref 0.0–0.1)
Basophils Relative: 1 %
Eosinophils Absolute: 0.3 10*3/uL (ref 0.0–0.5)
Eosinophils Relative: 4 %
HCT: 41.1 % (ref 39.0–52.0)
Hemoglobin: 13.9 g/dL (ref 13.0–17.0)
Immature Granulocytes: 0 %
Lymphocytes Relative: 31 %
Lymphs Abs: 2.2 10*3/uL (ref 0.7–4.0)
MCH: 29.6 pg (ref 26.0–34.0)
MCHC: 33.8 g/dL (ref 30.0–36.0)
MCV: 87.4 fL (ref 80.0–100.0)
Monocytes Absolute: 0.5 10*3/uL (ref 0.1–1.0)
Monocytes Relative: 7 %
Neutro Abs: 3.9 10*3/uL (ref 1.7–7.7)
Neutrophils Relative %: 57 %
Platelets: 146 10*3/uL — ABNORMAL LOW (ref 150–400)
RBC: 4.7 MIL/uL (ref 4.22–5.81)
RDW: 12.7 % (ref 11.5–15.5)
WBC: 7 10*3/uL (ref 4.0–10.5)
nRBC: 0 % (ref 0.0–0.2)

## 2022-04-03 LAB — URINALYSIS, ROUTINE W REFLEX MICROSCOPIC
Bilirubin Urine: NEGATIVE
Glucose, UA: NEGATIVE mg/dL
Hgb urine dipstick: NEGATIVE
Ketones, ur: 5 mg/dL — AB
Leukocytes,Ua: NEGATIVE
Nitrite: NEGATIVE
Protein, ur: NEGATIVE mg/dL
Specific Gravity, Urine: 1.017 (ref 1.005–1.030)
pH: 7 (ref 5.0–8.0)

## 2022-04-03 LAB — LACTIC ACID, PLASMA: Lactic Acid, Venous: 1.1 mmol/L (ref 0.5–1.9)

## 2022-04-03 LAB — COMPREHENSIVE METABOLIC PANEL
ALT: 5 U/L (ref 0–44)
AST: 18 U/L (ref 15–41)
Albumin: 3.5 g/dL (ref 3.5–5.0)
Alkaline Phosphatase: 38 U/L (ref 38–126)
Anion gap: 7 (ref 5–15)
BUN: 25 mg/dL — ABNORMAL HIGH (ref 8–23)
CO2: 31 mmol/L (ref 22–32)
Calcium: 10.1 mg/dL (ref 8.9–10.3)
Chloride: 106 mmol/L (ref 98–111)
Creatinine, Ser: 0.91 mg/dL (ref 0.61–1.24)
GFR, Estimated: >60 mL/min (ref >60)
Glucose, Bld: 91 mg/dL (ref 70–99)
Potassium: 4.1 mmol/L (ref 3.5–5.1)
Sodium: 144 mmol/L (ref 135–145)
Total Bilirubin: 0.7 mg/dL (ref 0.3–1.2)
Total Protein: 6.1 g/dL — ABNORMAL LOW (ref 6.5–8.1)

## 2022-04-03 LAB — LIPASE, BLOOD: Lipase: 33 U/L (ref 11–51)

## 2022-04-03 LAB — TROPONIN I (HIGH SENSITIVITY): Troponin I (High Sensitivity): 7 ng/L (ref <18)

## 2022-04-03 MED ORDER — AMOXICILLIN-POT CLAVULANATE 875-125 MG PO TABS: 1.0000 | ORAL_TABLET | Freq: Two times a day (BID) | ORAL | 0 refills | Status: AC

## 2022-04-03 MED ORDER — SODIUM CHLORIDE 0.9 % IV BOLUS
1000.0000 mL | Freq: Once | INTRAVENOUS | Status: AC
  Administered 2022-04-03: 1000 mL via INTRAVENOUS

## 2022-04-03 MED ORDER — SODIUM CHLORIDE 0.9 % IV SOLN
2.0000 g | Freq: Once | INTRAVENOUS | Status: AC
  Administered 2022-04-03: 2 g via INTRAVENOUS
  Filled 2022-04-03: qty 20

## 2022-04-03 NOTE — ED Notes (Signed)
Patient more alert. Talking with spouse. No complaints. States "I want to get home before lunch".

## 2022-04-03 NOTE — Telephone Encounter (Signed)
Patient wife called and stated patient is not hisself and not responding well. Patient was sent to access nurse.

## 2022-04-03 NOTE — Telephone Encounter (Signed)
[facility] - Client TELEPHONE ADVICE RECORD [facility] Patient Name: Joshua Everett EN Gender: Male DOB: [birth year] Age: 87 Y 81 M 23 D Return Phone Number: [phone] (Primary) Address: City/ State/ Zip: [place]  [address] Client [facility] - Client Client Site [place] Provider Ria Bush - MD Contact Type Call Who Is Calling Patient / Member / Family / Caregiver Call Type Triage / Clinical Caller Name [name] Relationship To Patient Spouse Return Phone Number [phone] (Primary) Chief Complaint Blood Pressure Low Reason for Call Symptomatic / Request for Health Information Initial Comment Caller transferred from office- Wife states husband is not responding well and is not being himself. Husband has parkinsons and dementia. Heart rate 105 pulse rate ranging 40-60's. Last blood pressure 115/69. PC asked caller if patient is acting confused and caller stated no. PT is currently at patient's house and they just see that patient is not acting normal. Translation No Nurse Assessment Nurse: Cherre Robins, RN, Ria Comment Date/Time (Eastern Time): 04/03/2022 9:58:49 AM Confirm and document reason for call. If symptomatic, describe symptoms. ---Caller states her son is acting abnormally today. States he wasn't responding to his physical therapist directions this morning and just seems "out of it today. States his current BP 115/69 Does the patient have any new or worsening symptoms? ---Yes Will a triage be completed? ---Yes Related visit to physician within the last 2 weeks? ---No Does the PT have any chronic conditions? (i.e. diabetes, asthma, this includes High risk factors for pregnancy, etc.) ---Yes List chronic conditions. ---parkinson's, dementia Is this a behavioral health or substance abuse call? ---No Guidelines Guideline Title Affirmed Question Affirmed Notes Nurse  Date/Time (Eastern Time) Dementia Symptoms and Questions [1] Difficult to awaken or acting confused (e.g., Weiss-Hilton, RN, Ria Comment 04/03/2022 10:01:12 AM PLEASE NOTE: All timestamps contained within this report are represented as [place] Standard Time. CONFIDENTIALTY NOTICE: This fax transmission is intended only for the addressee. It contains information that is legally privileged, confidential or otherwise protected from use or disclosure. If you are not the intended recipient, you are strictly prohibited from reviewing, disclosing, copying using or disseminating any of this information or taking any action in reliance on or regarding this information. If you have received this fax in error, please notify us immediately by telephone so that we can arrange for its return to [place]. Phone: [phone], Toll-Free: [phone], Fax: [phone] Page: 2 of 2 Call Id: [id number] Guidelines Guideline Title Affirmed Question Affirmed Notes Nurse Date/Time Eilene Ghazi Time) disoriented, slurred speech) AND [2] present now AND [3] new-onset Disp. Time Eilene Ghazi Time) Disposition Final User 04/03/2022 10:03:02 AM Call EMS 911 Now Yes Weiss-Hilton, RN, Ria Comment 04/03/2022 10:08:20 AM 911 Outcome Documentation Weiss-Hilton, RN, Ria Comment Reason: Call went straight to voicemail. Final Disposition 04/03/2022 10:03:02 AM Call EMS 911 Now Yes Weiss-Hilton, RN, Ivonne Andrew Disagree/Comply Comply Caller Understands Yes PreDisposition InappropriateToAsk Care Advice Given Per Guideline CALL EMS 911 NOW: * Immediate medical attention is needed. You need to hang up and call 911 (or an ambulance). * Triager Discretion: I'll call you back in a few minutes to be sure you were able to reach them. CARE ADVICE given per Dementia Symptoms and Questions (Adult) guideline

## 2022-04-03 NOTE — ED Triage Notes (Signed)
"  History of dementia, Oriented x 1-2 at baseline. This morning he has just been wandering around. Not able to focus to follow directions when PT came to do his PT. We got concerned that something was wrong because he just seems kind of off this morning" per spouse.  Oriented x 1 on arrival. Able to follow simple commands. No complaints.

## 2022-04-03 NOTE — Discharge Instructions (Addendum)
Try to drink at least 6 glasses of water daily  Take the antibiotics as prescribed  I'd recommend seeing your primary doctor in 2 days or so for check-up and repeat evaluation, lung exam

## 2022-04-03 NOTE — ED Provider Notes (Signed)
[facility] Provider Note    Event Date/Time   First MD Initiated Contact with Patient 04/03/22 1056     (approximate)   History   Altered Mental Status   HPI  Joshua Everett is a 87 y.o. male here with altered mental status.  History provided primarily by the patient's wife.  Per report, the patient was significantly less responsive than he normally does.  He would not talk to her.  He was roaming on the house.  This was significantly off his baseline.  He has not been ill.  No recent medication changes.  She became concerned so presents for evaluation.  Patient remains slightly less responsive than usual.  She does not recall any recent medication changes.  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  He has not had any increase in urinary output.  He has had slightly decreased p.o. intake.  He has had mild cough but no fevers.     Physical Exam   Triage Vital Signs: ED Triage Vitals  Enc Vitals Group     BP 04/03/22 1105 (!) 181/67     Pulse Rate 04/03/22 1105 (!) 50     Resp 04/03/22 1105 (!) 25     Temp 04/03/22 1105 98.5 F (36.9 C)     Temp Source 04/03/22 1105 Oral     SpO2 04/03/22 1105 96 %     Weight 04/03/22 1112 200 lb (90.7 kg)     Height 04/03/22 1112 '5\' 10"'$  (1.778 m)     Head Circumference --      Peak Flow --      Pain Score 04/03/22 1105 0     Pain Loc --      Pain Edu? --      Excl. in [place]? --     Most recent vital signs: Vitals:   04/03/22 1415 04/03/22 1500  BP: (!) 182/75 (!) 164/57  Pulse: (!) 47 (!) 51  Resp: 15 13  Temp:    SpO2: 98% 97%     General: Awake, no distress.  CV:  Good peripheral perfusion.  Regular rate and rhythm.  No murmurs or rubs. Resp:  Normal effort.  Lungs with slight rales in the left base, normal work of breathing. Abd:  No distention.  Other:  On my examination, alert, oriented to person and place but not time which seems to be his baseline.   Strength 5 out of 5 bilateral upper and lower extremities.  Normal station light touch.  No cranial nerve deficits.   ED Results / Procedures / Treatments   Labs (all labs ordered are listed, but only abnormal results are displayed) Labs Reviewed  CBC WITH DIFFERENTIAL/PLATELET - Abnormal; Notable for the following components:      Result Value   Platelets 146 (*)    All other components within normal limits  COMPREHENSIVE METABOLIC PANEL - Abnormal; Notable for the following components:   BUN 25 (*)    Total Protein 6.1 (*)    All other components within normal limits  URINALYSIS, ROUTINE W REFLEX MICROSCOPIC - Abnormal; Notable for the following components:   Color, Urine YELLOW (*)    APPearance HAZY (*)    Ketones, ur 5 (*)    All other components within normal limits  LIPASE, BLOOD  LACTIC ACID, PLASMA  LACTIC ACID, PLASMA  TROPONIN I (HIGH SENSITIVITY)     EKG Sinus rhythm, ventricular rate 52.  PR  186, QRS 104, QTc 4 2.  No acute ST elevations or depressions.   RADIOLOGY CT head: No acute intracranial abnormality Chest x-ray: Mild left basilar subsegmental atelectasis   I also independently reviewed and agree with radiologist interpretations.   PROCEDURES:  Critical Care performed: No    MEDICATIONS ORDERED IN ED: Medications  sodium chloride 0.9 % bolus 1,000 mL (1,000 mLs Intravenous New Bag/Given 04/03/22 1531)  cefTRIAXone (ROCEPHIN) 2 g in sodium chloride 0.9 % 100 mL IVPB (2 g Intravenous New Bag/Given 04/03/22 1535)     IMPRESSION / MDM / ASSESSMENT AND PLAN / ED COURSE  I reviewed the triage vital signs and the nursing notes.                               The patient is on the cardiac monitor to evaluate for evidence of arrhythmia and/or significant heart rate changes.   Ddx:  Differential includes the following, with pertinent life- or limb-threatening emergencies considered:  Acute encephalopathy due to occult infection such as UTI or  pneumonia, metabolic encephalopathy, dehydration, polypharmacy  Patient's presentation is most consistent with acute presentation with potential threat to life or bodily function.  MDM:  87 year old male here with altered mental status.  Patient is now back to his baseline.  Clinically appears overall very well-appearing.  He has no focal neurological deficits.  Chest x-ray is clear.  CT head is negative.  Urinalysis shows mild dehydration and he seems to have improved significantly with some IV fluids.  Troponin negative, EKG is nonischemic.  CBC and CMP are unremarkable.  CMP does show elevated BUN to creatinine ratio consistent with dehydration.  Chest x-ray does show left basilar subsegmental atelectasis, and he has had a mild cough.  Will treat for possible early pneumonia although clinically, this is somewhat less likely.  I long discussion with the patient's wife regarding possible admission for observation, and she would like to attempt outpatient management and feels he would be much better at home which I think is reasonable given his dementia.  Will place on Augmentin for possible early pneumonia, encourage hydration, and outpatient follow-up.   MEDICATIONS GIVEN IN ED: Medications  sodium chloride 0.9 % bolus 1,000 mL (1,000 mLs Intravenous New Bag/Given 04/03/22 1531)  cefTRIAXone (ROCEPHIN) 2 g in sodium chloride 0.9 % 100 mL IVPB (2 g Intravenous New Bag/Given 04/03/22 1535)     Consults:     EMR reviewed       FINAL CLINICAL IMPRESSION(S) / ED DIAGNOSES   Final diagnoses:  Transient alteration of awareness  Dehydration  Atelectasis of left lung     Rx / DC Orders   ED Discharge Orders          Ordered    amoxicillin-clavulanate (AUGMENTIN) 875-125 MG tablet  2 times daily        04/03/22 1505             Note:  This document was prepared using Dragon voice recognition software and may include unintentional dictation errors.   Duffy Williamson,  MD 04/03/22 [phone]

## 2022-04-03 NOTE — Telephone Encounter (Signed)
Per chart review tab pt is at [facility] ED. Sending note to Dr Darnell Level and Lattie Haw CMA.

## 2022-04-04 NOTE — Telephone Encounter (Signed)
Spoke to patient's wife and was advised that he seems to be feeling better today. Patient's wife stated that he does not have a fever today and is eating well. Patient's wife stated that she is pushing fluids. Patient's wife was advised to call the office back if he does not continue to improve. Patient's wife stated that she has already scheduled a follow-up appointment with Dr. Danise Mina next week for her husband.

## 2022-04-04 NOTE — Telephone Encounter (Signed)
Seen at ER, treated for dehydration and possible early pneumonia with augmentin course.  Plz call for update on symptoms.

## 2022-04-06 ENCOUNTER — Telehealth: Payer: Self-pay

## 2022-04-06 NOTE — Chronic Care Management (AMB) (Addendum)
Chronic Care Management Pharmacy Assistant   Name: Tavien Chestnut  MRN: [id number] DOB: September 18, 1934  Reason for Encounter: Non-CCM (Hosptial Follow Up)  Medications: Outpatient Encounter Medications as of 04/06/2022  Medication Sig   amoxicillin-clavulanate (AUGMENTIN) 875-125 MG tablet Take 1 tablet by mouth 2 (two) times daily for 7 days.   bimatoprost (LUMIGAN) 0.01 % SOLN Place 1 drop into both eyes at bedtime.    carbidopa-levodopa (SINEMET IR) 25-100 MG tablet Take 1 tablet at 8am, 2 tablets at 12pm, 1 tablet at 4pm   cholecalciferol (VITAMIN D) 1000 units tablet Take 1,000 Units by mouth daily.    cycloSPORINE (RESTASIS) 0.05 % ophthalmic emulsion Place 1 drop into both eyes 2 (two) times daily.   docusate sodium (COLACE) 100 MG capsule Take 1 capsule (100 mg total) by mouth daily.   Fiber Complete TABS Take 2 tablets by mouth daily.   gabapentin (NEURONTIN) 100 MG capsule Take 100 mg by mouth at bedtime.   linaclotide (LINZESS) 290 MCG CAPS capsule Take 1 capsule (290 mcg total) by mouth daily as needed (constipation).   lisinopril (ZESTRIL) 20 MG tablet Take 1 tablet (20 mg total) by mouth daily.   Multiple Vitamins-Minerals (MULTIVITAMIN ADULT PO) Take 1 tablet by mouth daily.   polyethylene glycol powder (GLYCOLAX/MIRALAX) 17 GM/SCOOP powder Take 17 g by mouth daily as needed for moderate constipation.   rivastigmine (EXELON) 1.5 MG capsule Take 1.5 mg by mouth 2 (two) times daily.   No facility-administered encounter medications on file as of 04/06/2022.    Reviewed hospital notes for details of recent visit. Has patient been contacted by Transitions of Care team? No Has patient seen PCP/specialist for hospital follow up (summarize OV if yes): No  Admitted to the ED on 04/03/2022. Discharge date was 04/03/2022.  Discharged from [facility].   Discharge diagnosis (Principal Problem): Transient alteration of awareness, Dehydration and Atelectasis of left  lung   Patient was discharged to Home  Brief summary of hospital course: 87 year old male here with altered mental status. Patient is now back to his baseline.  Clinically appears overall very well-appearing.  He has no focal neurological deficits.  Chest x-ray is clear.  CT head is negative.  Urinalysis shows mild dehydration and he seems to have improved significantly with some IV fluids. Troponin negative, EKG is nonischemic.  CBC and CMP are unremarkable.  CMP does show elevated BUN to creatinine ratio consistent with dehydration.  Chest x-ray does show left basilar subsegmental atelectasis, and he has had a mild cough.  Will treat for possible early pneumonia although clinically, this is somewhat less likely.  I long discussion with the patient's wife regarding possible admission for observation, and she would like to attempt outpatient management and feels he would be much better at home which I think is reasonable given his dementia.  Will place on Augmentin for possible early pneumonia, encourage hydration, and outpatient follow-up.   New?Medications Started at [facility] Discharge:?? -Started amoxicillin-clavulanate (AUGMENTIN) 875-125 MG tablet    Medications that remain the same after Hospital Discharge:??  -All other medications will remain the same.    Next CCM appt: Non-CCM  Other upcoming appts: PCP appointment on 04/07/2022 for hospital follow up  Charlene Brooke, PharmD notified and will determine if action is needed.  Charlene Brooke, CPP notified  Marijean Niemann, [place] Clinical Pharmacy Assistant [phone]   Pharmacist addendum: Patient has seen PCP for follow up and sx have improved. No further action needed.  Mendel Ryder  Foltanski, PharmD, [place] 04/26/22 12:15 PM

## 2022-04-07 ENCOUNTER — Ambulatory Visit (INDEPENDENT_AMBULATORY_CARE_PROVIDER_SITE_OTHER): Payer: Medicare Other | Admitting: Family Medicine

## 2022-04-07 ENCOUNTER — Encounter: Payer: Self-pay | Admitting: Family Medicine

## 2022-04-07 VITALS — BP 130/68 | HR 50 | Temp 97.9°F | Ht 70.0 in | Wt 203.2 lb

## 2022-04-07 DIAGNOSIS — K5909 Other constipation: Secondary | ICD-10-CM

## 2022-04-07 DIAGNOSIS — I10 Essential (primary) hypertension: Secondary | ICD-10-CM

## 2022-04-07 DIAGNOSIS — R410 Disorientation, unspecified: Secondary | ICD-10-CM

## 2022-04-07 MED ORDER — SENNA-DOCUSATE SODIUM 8.6-50 MG PO TABS: 1.0000 | ORAL_TABLET | Freq: Every day | ORAL | Status: AC

## 2022-04-07 NOTE — Patient Instructions (Addendum)
For bowels - continue colace daily, add senna or sennakot daily. Let us know how we do with this.  Finish antibiotic course.  I'm glad you're doing better today. Keep February appointment

## 2022-04-07 NOTE — Progress Notes (Unsigned)
Patient ID: Joshua Everett, male    DOB: May 03, 1935, 87 y.o.   MRN: [id number]  This visit was conducted in person.  BP 130/68 (BP Location: Right Arm, Patient Position: Sitting)   Pulse (!) 50   Temp 97.9 F (36.6 C) (Skin)   Ht '5\' 10"'$  (7.564 m)   Wt 203 lb 4 oz (92.2 kg)   SpO2 97%   BMI 29.16 kg/m   No data found.   CC: ER f/u visit  Subjective:   HPI: Joshua Everett is a 87 y.o. male presenting on 04/07/2022 for Follow-up (ER,,  wife is present today)   Known seizures, parkinson disease on sinemet followed by [facility] neurology Dr Manuella Ghazi. Known REM sleep behavior disorder now on gabapentin '100mg'$  nightly in place of melatonin. Significant sundowning going on. History of orthostatic hypotension. Saw neurology 02/2022 and 03/2022 - discussed placement in skilled ALF. Off rivastigmine for months now.   HTN, continues lisinopril '20mg'$  daily. Most recently amlodipine stopped due to orthostatic hypotension.   Recent ER visit on Monday for AMS, treated for dehydration and possible early pneumonia with IV rocephin then oral augmentin course to complete 7 days. This was initially noted by wife and HHPT - they called EMT.   No cough or fever.   Wife notes he doesn't drink much throughout the day.   Head CT - no acute abnormality CXR - mild L basilar subsegmental atx, ?early PNA   Ongoing difficulty with chronic constipation, with trouble getting stool out - wife helps with this. Continues Colace stool softener nightly as well as miralax 1 capful daily.      Relevant past medical, surgical, family and social history reviewed and updated as indicated. Interim medical history since our last visit reviewed. Allergies and medications reviewed and updated. Outpatient Medications Prior to Visit  Medication Sig Dispense Refill   amoxicillin-clavulanate (AUGMENTIN) 875-125 MG tablet Take 1 tablet by mouth 2 (two) times daily for 7 days. 14 tablet 0   aspirin EC 81 MG tablet Take 81 mg by  mouth daily. Swallow whole.     bimatoprost (LUMIGAN) 0.01 % SOLN Place 1 drop into both eyes at bedtime.      carbidopa-levodopa (SINEMET IR) 25-100 MG tablet Take 1 tablet at 8am, 2 tablets at 12pm, 1 tablet at 4pm     cholecalciferol (VITAMIN D) 1000 units tablet Take 1,000 Units by mouth daily.      Cyanocobalamin (VITAMIN B 12 PO) Take by mouth daily.     docusate sodium (COLACE) 100 MG capsule Take 1 capsule (100 mg total) by mouth daily.     Fiber Complete TABS Take 2 tablets by mouth daily.     gabapentin (NEURONTIN) 100 MG capsule Take 100 mg by mouth at bedtime.     linaclotide (LINZESS) 290 MCG CAPS capsule Take 1 capsule (290 mcg total) by mouth daily as needed (constipation).     lisinopril (ZESTRIL) 20 MG tablet Take 1 tablet (20 mg total) by mouth daily. 90 tablet 3   Multiple Vitamins-Minerals (MULTIVITAMIN ADULT PO) Take 1 tablet by mouth daily.     polyethylene glycol powder (GLYCOLAX/MIRALAX) 17 GM/SCOOP powder Take 17 g by mouth daily as needed for moderate constipation.     aspirin EC 81 MG tablet Take 81 mg by mouth daily. Swallow whole. (Patient not taking: Reported on 04/07/2022)     cycloSPORINE (RESTASIS) 0.05 % ophthalmic emulsion Place 1 drop into both eyes 2 (two) times daily. (Patient not  taking: Reported on 04/07/2022)     rivastigmine (EXELON) 1.5 MG capsule Take 1.5 mg by mouth 2 (two) times daily. (Patient not taking: Reported on 04/07/2022)     No facility-administered medications prior to visit.     Per HPI unless specifically indicated in ROS section below Review of Systems  Objective:  BP 130/68 (BP Location: Right Arm, Patient Position: Sitting)   Pulse (!) 50   Temp 97.9 F (36.6 C) (Skin)   Ht '5\' 10"'$  (1.778 m)   Wt 203 lb 4 oz (92.2 kg)   SpO2 97%   BMI 29.16 kg/m   Wt Readings from Last 3 Encounters:  04/07/22 203 lb 4 oz (92.2 kg)  04/03/22 200 lb (90.7 kg)  01/27/22 203 lb 6 oz (92.3 kg)      Physical Exam Vitals and nursing note  reviewed.  Constitutional:      Appearance: Normal appearance. He is not ill-appearing.  HENT:     Head: Normocephalic and atraumatic.     Mouth/Throat:     Mouth: Mucous membranes are moist.     Pharynx: Oropharynx is clear. No oropharyngeal exudate or posterior oropharyngeal erythema.  Eyes:     Extraocular Movements: Extraocular movements intact.     Conjunctiva/sclera: Conjunctivae normal.     Pupils: Pupils are equal, round, and reactive to light.  Cardiovascular:     Rate and Rhythm: Normal rate and regular rhythm.     Pulses: Normal pulses.     Heart sounds: Normal heart sounds. No murmur heard. Pulmonary:     Effort: Pulmonary effort is normal. No respiratory distress.     Breath sounds: Normal breath sounds. No wheezing, rhonchi or rales.  Abdominal:     General: Bowel sounds are normal. There is no distension.     Palpations: Abdomen is soft. There is no mass.     Tenderness: There is no abdominal tenderness. There is no guarding.  Musculoskeletal:     Right lower leg: No edema.     Left lower leg: No edema.  Skin:    General: Skin is warm and dry.     Findings: No rash.  Neurological:     Mental Status: He is alert.  Psychiatric:        Mood and Affect: Mood normal.        Behavior: Behavior normal.       Results for orders placed or performed during the hospital encounter of 04/03/22  CBC with Differential  Result Value Ref Range   WBC 7.0 4.0 - 10.5 K/uL   RBC 4.70 4.22 - 5.81 MIL/uL   Hemoglobin 13.9 13.0 - 17.0 g/dL   HCT 41.1 39.0 - 52.0 %   MCV 87.4 80.0 - 100.0 fL   MCH 29.6 26.0 - 34.0 pg   MCHC 33.8 30.0 - 36.0 g/dL   RDW 12.7 11.5 - 15.5 %   Platelets 146 (L) 150 - 400 K/uL   nRBC 0.0 0.0 - 0.2 %   Neutrophils Relative % 57 %   Neutro Abs 3.9 1.7 - 7.7 K/uL   Lymphocytes Relative 31 %   Lymphs Abs 2.2 0.7 - 4.0 K/uL   Monocytes Relative 7 %   Monocytes Absolute 0.5 0.1 - 1.0 K/uL   Eosinophils Relative 4 %   Eosinophils Absolute 0.3 0.0 -  0.5 K/uL   Basophils Relative 1 %   Basophils Absolute 0.1 0.0 - 0.1 K/uL   Immature Granulocytes 0 %   Abs Immature  Granulocytes 0.02 0.00 - 0.07 K/uL  Comprehensive metabolic panel  Result Value Ref Range   Sodium 144 135 - 145 mmol/L   Potassium 4.1 3.5 - 5.1 mmol/L   Chloride 106 98 - 111 mmol/L   CO2 31 22 - 32 mmol/L   Glucose, Bld 91 70 - 99 mg/dL   BUN 25 (H) 8 - 23 mg/dL   Creatinine, Ser 0.91 0.61 - 1.24 mg/dL   Calcium 10.1 8.9 - 10.3 mg/dL   Total Protein 6.1 (L) 6.5 - 8.1 g/dL   Albumin 3.5 3.5 - 5.0 g/dL   AST 18 15 - 41 U/L   ALT 5 0 - 44 U/L   Alkaline Phosphatase 38 38 - 126 U/L   Total Bilirubin 0.7 0.3 - 1.2 mg/dL   GFR, Estimated >60 >60 mL/min   Anion gap 7 5 - 15  Lipase, blood  Result Value Ref Range   Lipase 33 11 - 51 U/L  Lactic acid, plasma  Result Value Ref Range   Lactic Acid, Venous 1.1 0.5 - 1.9 mmol/L  Urinalysis, Routine w reflex microscopic  Result Value Ref Range   Color, Urine YELLOW (A) YELLOW   APPearance HAZY (A) CLEAR   Specific Gravity, Urine 1.017 1.005 - 1.030   pH 7.0 5.0 - 8.0   Glucose, UA NEGATIVE NEGATIVE mg/dL   Hgb urine dipstick NEGATIVE NEGATIVE   Bilirubin Urine NEGATIVE NEGATIVE   Ketones, ur 5 (A) NEGATIVE mg/dL   Protein, ur NEGATIVE NEGATIVE mg/dL   Nitrite NEGATIVE NEGATIVE   Leukocytes,Ua NEGATIVE NEGATIVE  Troponin I (High Sensitivity)  Result Value Ref Range   Troponin I (High Sensitivity) 7 <18 ng/L    Assessment & Plan:   Problem List Items Addressed This Visit   None    No orders of the defined types were placed in this encounter.  No orders of the defined types were placed in this encounter.    ***Follow up plan: No follow-ups on file.  Ria Bush, MD

## 2022-04-08 ENCOUNTER — Encounter: Payer: Self-pay | Admitting: Family Medicine

## 2022-04-08 DIAGNOSIS — K5900 Constipation, unspecified: Secondary | ICD-10-CM | POA: Insufficient documentation

## 2022-04-08 DIAGNOSIS — R4182 Altered mental status, unspecified: Secondary | ICD-10-CM | POA: Insufficient documentation

## 2022-04-08 NOTE — Assessment & Plan Note (Signed)
Chronic, stable period on lisinopril '20mg'$  daily, now off amlodipine.

## 2022-04-08 NOTE — Assessment & Plan Note (Addendum)
Ongoing difficulty with chronic constipation - anticipate combination of slowed colonic transit and outlet dysfunction as he's unable to fully evacuate, with autonomic dysfunction from PD contributing.  Reviewed bowel regimen - recommend trial senna, may continue PRN miralax. They note he has trouble tolerating linzess - will stop.  Discussed fluid intake, recommend fluid filled foods such as berries, watermelon, celery, grapes, etc.  Consider probiotic and lubiprostone (amitiza) Will also recommend bran constipation recipe.

## 2022-04-08 NOTE — Assessment & Plan Note (Signed)
Of unclear etiology, anticipate dehydration contributing. Treated for possible early pneumonia given CXR findings - recommend finish antibiotic course. He did quickly improve after IVF in ER.

## 2022-04-10 DIAGNOSIS — I10 Essential (primary) hypertension: Secondary | ICD-10-CM | POA: Diagnosis not present

## 2022-04-10 DIAGNOSIS — R296 Repeated falls: Secondary | ICD-10-CM | POA: Diagnosis not present

## 2022-04-10 DIAGNOSIS — F0282 Dementia in other diseases classified elsewhere, unspecified severity, with psychotic disturbance: Secondary | ICD-10-CM | POA: Diagnosis not present

## 2022-04-10 DIAGNOSIS — G20A1 Parkinson's disease without dyskinesia, without mention of fluctuations: Secondary | ICD-10-CM | POA: Diagnosis not present

## 2022-04-10 DIAGNOSIS — F02818 Dementia in other diseases classified elsewhere, unspecified severity, with other behavioral disturbance: Secondary | ICD-10-CM | POA: Diagnosis not present

## 2022-04-10 DIAGNOSIS — M1990 Unspecified osteoarthritis, unspecified site: Secondary | ICD-10-CM | POA: Diagnosis not present

## 2022-04-18 DIAGNOSIS — F02818 Dementia in other diseases classified elsewhere, unspecified severity, with other behavioral disturbance: Secondary | ICD-10-CM | POA: Diagnosis not present

## 2022-04-18 DIAGNOSIS — I10 Essential (primary) hypertension: Secondary | ICD-10-CM | POA: Diagnosis not present

## 2022-04-18 DIAGNOSIS — F0282 Dementia in other diseases classified elsewhere, unspecified severity, with psychotic disturbance: Secondary | ICD-10-CM | POA: Diagnosis not present

## 2022-04-18 DIAGNOSIS — M1990 Unspecified osteoarthritis, unspecified site: Secondary | ICD-10-CM | POA: Diagnosis not present

## 2022-04-18 DIAGNOSIS — R296 Repeated falls: Secondary | ICD-10-CM | POA: Diagnosis not present

## 2022-04-18 DIAGNOSIS — G20A1 Parkinson's disease without dyskinesia, without mention of fluctuations: Secondary | ICD-10-CM | POA: Diagnosis not present

## 2022-04-28 DIAGNOSIS — R296 Repeated falls: Secondary | ICD-10-CM | POA: Diagnosis not present

## 2022-04-28 DIAGNOSIS — F0282 Dementia in other diseases classified elsewhere, unspecified severity, with psychotic disturbance: Secondary | ICD-10-CM | POA: Diagnosis not present

## 2022-04-28 DIAGNOSIS — F02818 Dementia in other diseases classified elsewhere, unspecified severity, with other behavioral disturbance: Secondary | ICD-10-CM | POA: Diagnosis not present

## 2022-04-28 DIAGNOSIS — I10 Essential (primary) hypertension: Secondary | ICD-10-CM | POA: Diagnosis not present

## 2022-04-28 DIAGNOSIS — G20A1 Parkinson's disease without dyskinesia, without mention of fluctuations: Secondary | ICD-10-CM | POA: Diagnosis not present

## 2022-04-28 DIAGNOSIS — M1990 Unspecified osteoarthritis, unspecified site: Secondary | ICD-10-CM | POA: Diagnosis not present

## 2022-05-03 ENCOUNTER — Encounter: Payer: Self-pay | Admitting: Emergency Medicine

## 2022-05-03 ENCOUNTER — Emergency Department: Payer: Medicare Other

## 2022-05-03 DIAGNOSIS — M47812 Spondylosis without myelopathy or radiculopathy, cervical region: Secondary | ICD-10-CM | POA: Diagnosis not present

## 2022-05-03 DIAGNOSIS — I10 Essential (primary) hypertension: Secondary | ICD-10-CM | POA: Diagnosis not present

## 2022-05-03 DIAGNOSIS — W01198A Fall on same level from slipping, tripping and stumbling with subsequent striking against other object, initial encounter: Secondary | ICD-10-CM | POA: Diagnosis not present

## 2022-05-03 DIAGNOSIS — G20A1 Parkinson's disease without dyskinesia, without mention of fluctuations: Secondary | ICD-10-CM | POA: Insufficient documentation

## 2022-05-03 DIAGNOSIS — Z85828 Personal history of other malignant neoplasm of skin: Secondary | ICD-10-CM | POA: Diagnosis not present

## 2022-05-03 DIAGNOSIS — Z23 Encounter for immunization: Secondary | ICD-10-CM | POA: Insufficient documentation

## 2022-05-03 DIAGNOSIS — S0101XA Laceration without foreign body of scalp, initial encounter: Secondary | ICD-10-CM | POA: Diagnosis not present

## 2022-05-03 DIAGNOSIS — Z043 Encounter for examination and observation following other accident: Secondary | ICD-10-CM | POA: Diagnosis not present

## 2022-05-03 DIAGNOSIS — R079 Chest pain, unspecified: Secondary | ICD-10-CM | POA: Diagnosis not present

## 2022-05-03 DIAGNOSIS — S0990XA Unspecified injury of head, initial encounter: Secondary | ICD-10-CM | POA: Diagnosis present

## 2022-05-03 NOTE — ED Triage Notes (Signed)
Pt presents via POV with complaints of fall and head laceration. Pt takes ASA daily. He has a laceration to the right side of the back of his head, bleeding controlled with gauze. Hx of parkinsons & dementia. Per spouse, hit his head on the floor -typically walks with a cane. No LOC, no mentation change. Denies CP or SOB.

## 2022-05-04 ENCOUNTER — Telehealth: Payer: Self-pay

## 2022-05-04 ENCOUNTER — Emergency Department
Admission: EM | Admit: 2022-05-04 | Discharge: 2022-05-04 | Disposition: A | Discharge status: Home / Self Care | Payer: Medicare Other | Attending: Emergency Medicine | Admitting: Emergency Medicine

## 2022-05-04 ENCOUNTER — Emergency Department: Payer: Medicare Other

## 2022-05-04 DIAGNOSIS — R079 Chest pain, unspecified: Secondary | ICD-10-CM | POA: Diagnosis not present

## 2022-05-04 DIAGNOSIS — W19XXXA Unspecified fall, initial encounter: Secondary | ICD-10-CM

## 2022-05-04 DIAGNOSIS — S0101XA Laceration without foreign body of scalp, initial encounter: Secondary | ICD-10-CM | POA: Diagnosis not present

## 2022-05-04 MED ORDER — LIDOCAINE-EPINEPHRINE 2 %-1:100000 IJ SOLN
20.0000 mL | Freq: Once | INTRAMUSCULAR | Status: AC
  Administered 2022-05-04: 20 mL
  Filled 2022-05-04: qty 1

## 2022-05-04 MED ORDER — TETANUS-DIPHTH-ACELL PERTUSSIS 5-2.5-18.5 LF-MCG/0.5 IM SUSY
0.5000 mL | PREFILLED_SYRINGE | Freq: Once | INTRAMUSCULAR | Status: AC
  Administered 2022-05-04: 0.5 mL via INTRAMUSCULAR
  Filled 2022-05-04: qty 0.5

## 2022-05-04 NOTE — ED Provider Notes (Signed)
[facility] Provider Note    Event Date/Time   First MD Initiated Contact with Patient 05/04/22 [phone]     (approximate)   History   Fall   HPI  Joshua Everett is a 87 y.o. male has medical history of Parkinson's disease and dementia presents after a fall.  Patient was with his wife in the bathroom he did not have his cane because is getting ready for bed.  She went to change laundry and came back and he was on the floor on his back.  She is able to get him up and he has been able to walk with a walker since.  She denies any change in mental status he is at his baseline which is typically not very communicative but alert and oriented.  He has not complained of pain.  Patient remembers the fall but cannot really tell me why he fell.  Denies pain currently.     Past Medical History:  Diagnosis Date   Actinic keratosis    Basal cell carcinoma 09/03/2014   right of midline forehead   Basal cell carcinoma 03/25/2019   right lower nasal dorsum, EDC   Basal cell carcinoma 03/30/2020   Mid nasal tip, EDC   Basal cell carcinoma (BCC) 01/28/2015   right proximal nasal alar rim   BPH without obstruction/lower urinary tract symptoms 09/08/2014   Chronic constipation 09/08/2014   GERD (gastroesophageal reflux disease)    Glaucoma    History of chicken pox    Hyperlipidemia    Obesity, Class I, BMI 30-34.9 09/08/2014   Prediabetes 07/31/2015   Rosacea 07/06/2015   Squamous cell carcinoma of skin 01/30/2007   mid back, in situ   Squamous cell carcinoma of skin 03/12/2018   right forearm, in situ   Squamous cell carcinoma of skin 04/22/2018   left pretibial   Squamous cell carcinoma of skin 09/24/2018   right hand dorsum, in situ   Urine incontinence     Patient Active Problem List   Diagnosis Date Noted   AMS (altered mental status) 04/08/2022   Syncope 06/22/2020   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[place]) 06/22/2020   Pedal edema 08/29/2019    Medicare annual wellness visit, subsequent 01/13/2019   Parkinson disease 03/12/2018   Abnormal gait 01/04/2018   Overactive bladder 05/21/2017   Chronic pain of right knee 01/01/2017   Chest pain with moderate risk for cardiac etiology 08/17/2016   Advanced care planning/counseling discussion 01/23/2016   Prediabetes 07/31/2015   Rosacea 07/06/2015   Personal history of other malignant neoplasm of skin 11/19/2014   Abdominal wall hernia 09/08/2014   BPH without obstruction/lower urinary tract symptoms 09/08/2014   Chronic constipation 09/08/2014   Dyslipidemia 09/08/2014   Glaucoma 09/08/2014   GERD (gastroesophageal reflux disease) 09/08/2014   Essential hypertension 09/08/2014   Overweight (BMI 25.0-29.9) 09/08/2014   Nocturia 11/13/2012     Physical Exam  Triage Vital Signs: ED Triage Vitals  Enc Vitals Group     BP 05/03/22 2233 (!) 178/85     Pulse Rate 05/03/22 2233 (!) 54     Resp 05/03/22 2233 16     Temp 05/03/22 2233 98.1 F (36.7 C)     Temp Source 05/03/22 2233 Oral     SpO2 05/03/22 2233 98 %     Weight 05/03/22 2234 198 lb (89.8 kg)     Height 05/03/22 2234 '5\' 10"'$  (1.778 m)     Head Circumference --  Peak Flow --      Pain Score --      Pain Loc --      Pain Edu? --      Excl. in [place]? --     Most recent vital signs: Vitals:   05/03/22 2233  BP: (!) 178/85  Pulse: (!) 54  Resp: 16  Temp: 98.1 F (36.7 C)  SpO2: 98%     General: Awake, no distress.  CV:  Good peripheral perfusion.  Resp:  Normal effort.  Abd:  No distention.  Neuro:             Awake, Alert, Oriented x 3  Other:  No C-spine tenderness Approximately 4 cm laceration on the right parietal scalp, hemostatic No signs of trauma to the face  Mild anterior chest wall tenderness no crepitus Abdomen is soft and nontender Pelvis is stable nontender 5 out of 5 grip strength bilateral upper extremities Bony tenderness of bilateral upper and lower extremities  ED Results /  Procedures / Treatments  Labs (all labs ordered are listed, but only abnormal results are displayed) Labs Reviewed - No data to display   EKG     RADIOLOGY I reviewed and interpreted the CT scan of the brain which does not show any acute intracranial process     PROCEDURES:  Critical Care performed: No  ..Laceration Repair  Date/Time: 05/04/2022 3:22 AM  Performed by: Rada Hay, MD Authorized by: Rada Hay, MD   Consent:    Consent obtained:  Verbal Universal protocol:    Patient identity confirmed:  Verbally with patient Anesthesia:    Anesthesia method:  Local infiltration   Local anesthetic:  Lidocaine 2% WITH epi Laceration details:    Location:  Scalp   Scalp location:  R parietal   Length (cm):  4 Exploration:    Imaging outcome: foreign body not noted     Wound exploration: entire depth of wound visualized     Contaminated: no   Treatment:    Area cleansed with:  Saline   Amount of cleaning:  Standard   Irrigation solution:  Sterile saline   Irrigation method:  Syringe   Visualized foreign bodies/material removed: no     Debridement:  None   Undermining:  None   Scar revision: no   Skin repair:    Repair method:  Staples   Number of staples:  4 Approximation:    Approximation:  Close Repair type:    Repair type:  Simple Post-procedure details:    Dressing:  Open (no dressing)   Procedure completion:  Tolerated well, no immediate complications     MEDICATIONS ORDERED IN ED: Medications  Tdap (BOOSTRIX) injection 0.5 mL (0.5 mLs Intramuscular Given 05/04/22 0249)  lidocaine-EPINEPHrine (XYLOCAINE W/EPI) 2 %-1:100000 (with pres) injection 20 mL (20 mLs Infiltration Given by Other 05/04/22 0250)     IMPRESSION / MDM / [place] / ED COURSE  I reviewed the triage vital signs and the nursing notes.                              Patient's presentation is most consistent with acute presentation with potential  threat to life or bodily function.  Differential diagnosis includes, but is not limited to, skull fracture intracranial hemorrhage cervical spine fracture, concussion, rib fracture  The patient is an 87 year old male with Parkinson disease and dementia typically walks with a cane had a  ground-level fall this evening in the bathroom.  He did not have his cane with him his wife found him on the floor after leaving to change the laundry.  He has been able to ambulate with a walker since she denies any change in his baseline mental status which is typically minimally verbal but alert and oriented.  Patient denies complaints currently he follows commands he has a laceration on his scalp that is hemostatic and some anterior chest wall tenderness but his exam is otherwise reassuring.  CT head and C-spine had no acute findings.  Will obtain a chest x-ray and update Tdap.  Laceration was closed with staples.       FINAL CLINICAL IMPRESSION(S) / ED DIAGNOSES   Final diagnoses:  Fall, initial encounter  Laceration of scalp, initial encounter     Rx / DC Orders   ED Discharge Orders     None        Note:  This document was prepared using Dragon voice recognition software and may include unintentional dictation errors.   Rada Hay, MD 05/04/22 [phone]

## 2022-05-04 NOTE — Telephone Encounter (Signed)
     Patient  visit on 11/29  at [facility]  Have you been able to follow up with your primary care physician? Yes   The patient was or was not able to obtain any needed medicine or equipment. Yes   Are there diet recommendations that you are having difficulty following? Yes   Patient expresses understanding of discharge instructions and education provided has no other needs at this time. Yes       [place], [facility], Care Management  [phone] [address], [place], [place] [address] Phone: [phone] Email: [email or web address]

## 2022-05-10 ENCOUNTER — Encounter: Payer: Self-pay | Admitting: Family Medicine

## 2022-05-10 ENCOUNTER — Ambulatory Visit (INDEPENDENT_AMBULATORY_CARE_PROVIDER_SITE_OTHER): Payer: Medicare Other | Admitting: Family Medicine

## 2022-05-10 VITALS — BP 134/76 | HR 61 | Temp 97.4°F | Ht 70.0 in | Wt 208.0 lb

## 2022-05-10 DIAGNOSIS — W19XXXA Unspecified fall, initial encounter: Secondary | ICD-10-CM

## 2022-05-10 DIAGNOSIS — G903 Multi-system degeneration of the autonomic nervous system: Secondary | ICD-10-CM

## 2022-05-10 DIAGNOSIS — K5909 Other constipation: Secondary | ICD-10-CM | POA: Diagnosis not present

## 2022-05-10 DIAGNOSIS — G20A1 Parkinson's disease without dyskinesia, without mention of fluctuations: Secondary | ICD-10-CM | POA: Diagnosis not present

## 2022-05-10 DIAGNOSIS — S0101XA Laceration without foreign body of scalp, initial encounter: Secondary | ICD-10-CM | POA: Diagnosis not present

## 2022-05-10 DIAGNOSIS — R269 Unspecified abnormalities of gait and mobility: Secondary | ICD-10-CM

## 2022-05-10 NOTE — Progress Notes (Unsigned)
Patient ID: Joshua Everett, male    DOB: 05-May-1935, 87 y.o.   MRN: [id number]  This visit was conducted in person.  BP 134/76   Pulse 61   Temp (!) 97.4 F (36.3 C) (Temporal)   Ht '5\' 10"'$  (1.778 m)   Wt 208 lb (94.3 kg)   SpO2 95%   BMI 29.84 kg/m    CC: hosp f/u visit  Subjective:   HPI: Joshua Everett is a 87 y.o. male presenting on 05/10/2022 for Hospitalization Follow-up (Seen on 05/04/22 at [facility] ED, dx fall; laceration of scalp. Pt accompanied by wife, Joshua Everett. )   Recent ER evaluation for fall - suffered at home while in the bathroom.  Work up unrevealing including head CT, cervical neck CT, and chest xray.  He had Tdap vaccine as well. Suffered small R posterior parietal scalp laceration and imaging showed generalized cerebral atrophy with chronic white matter small vessel ischemic changes, as well as marked severe degenerative disc disease most prominent at C5/6 and C6/7.   He gets up and can wander at night time.  Had another fall at home 05/05/2022 , hit his head.  He is unable to get up on his own after a fall.   Notes more unsteady on his feet.  Not sleeping well. This is despite gabapentin '100mg'$  nightly.   Just finished HHPT course last week.   Yesterday was bad day - he did not recognize his home.  Constipation - doing well on sennakot, prune juice, miralax and fiber supplement. He also takes [place]. Has not tried brand recipe     Relevant past medical, surgical, family and social history reviewed and updated as indicated. Interim medical history since our last visit reviewed. Allergies and medications reviewed and updated. Outpatient Medications Prior to Visit  Medication Sig Dispense Refill   aspirin EC 81 MG tablet Take 81 mg by mouth daily. Swallow whole.     bimatoprost (LUMIGAN) 0.01 % SOLN Place 1 drop into both eyes at bedtime.      carbidopa-levodopa (SINEMET IR) 25-100 MG tablet Take 2 tablets by mouth 4 (four) times daily.      cholecalciferol (VITAMIN D) 1000 units tablet Take 1,000 Units by mouth daily.      Cyanocobalamin (VITAMIN B 12 PO) Take by mouth daily.     cycloSPORINE (RESTASIS) 0.05 % ophthalmic emulsion Place 1 drop into both eyes 2 (two) times daily.     Fiber Complete TABS Take 2 tablets by mouth daily.     gabapentin (NEURONTIN) 100 MG capsule Take 100 mg by mouth at bedtime.     lisinopril (ZESTRIL) 20 MG tablet Take 1 tablet (20 mg total) by mouth daily. 90 tablet 3   Multiple Vitamins-Minerals (MULTIVITAMIN ADULT PO) Take 1 tablet by mouth daily.     polyethylene glycol powder (GLYCOLAX/MIRALAX) 17 GM/SCOOP powder Take 17 g by mouth daily as needed for moderate constipation.     sennosides-docusate sodium (SENOKOT-S) 8.6-50 MG tablet Take 1 tablet by mouth daily.     No facility-administered medications prior to visit.     Per HPI unless specifically indicated in ROS section below Review of Systems  Objective:  BP 134/76   Pulse 61   Temp (!) 97.4 F (36.3 C) (Temporal)   Ht '5\' 10"'$  (1.778 m)   Wt 208 lb (94.3 kg)   SpO2 95%   BMI 29.84 kg/m   Wt Readings from Last 3 Encounters:  05/10/22 208 lb (94.3 kg)  05/03/22 198 lb (89.8 kg)  04/07/22 203 lb 4 oz (92.2 kg)      Physical Exam Vitals and nursing note reviewed.  Constitutional:      Appearance: Normal appearance. He is not ill-appearing.     Comments: Ambulates with rolling walker  HENT:     Head: Normocephalic. Laceration present.      Mouth/Throat:     Mouth: Mucous membranes are moist.     Pharynx: Oropharynx is clear. No oropharyngeal exudate or posterior oropharyngeal erythema.  Eyes:     Extraocular Movements: Extraocular movements intact.     Pupils: Pupils are equal, round, and reactive to light.  Cardiovascular:     Rate and Rhythm: Normal rate and regular rhythm.     Pulses: Normal pulses.     Heart sounds: Normal heart sounds. No murmur heard. Pulmonary:     Effort: Pulmonary effort is normal. No  respiratory distress.     Breath sounds: Normal breath sounds. No wheezing, rhonchi or rales.  Musculoskeletal:     Right lower leg: No edema.     Left lower leg: No edema.  Skin:    General: Skin is warm and dry.     Findings: No rash.  Neurological:     Mental Status: He is alert.  Psychiatric:        Mood and Affect: Mood normal.        Behavior: Behavior normal.       Results for orders placed or performed during the hospital encounter of 04/03/22  CBC with Differential  Result Value Ref Range   WBC 7.0 4.0 - 10.5 K/uL   RBC 4.70 4.22 - 5.81 MIL/uL   Hemoglobin 13.9 13.0 - 17.0 g/dL   HCT 41.1 39.0 - 52.0 %   MCV 87.4 80.0 - 100.0 fL   MCH 29.6 26.0 - 34.0 pg   MCHC 33.8 30.0 - 36.0 g/dL   RDW 12.7 11.5 - 15.5 %   Platelets 146 (L) 150 - 400 K/uL   nRBC 0.0 0.0 - 0.2 %   Neutrophils Relative % 57 %   Neutro Abs 3.9 1.7 - 7.7 K/uL   Lymphocytes Relative 31 %   Lymphs Abs 2.2 0.7 - 4.0 K/uL   Monocytes Relative 7 %   Monocytes Absolute 0.5 0.1 - 1.0 K/uL   Eosinophils Relative 4 %   Eosinophils Absolute 0.3 0.0 - 0.5 K/uL   Basophils Relative 1 %   Basophils Absolute 0.1 0.0 - 0.1 K/uL   Immature Granulocytes 0 %   Abs Immature Granulocytes 0.02 0.00 - 0.07 K/uL  Comprehensive metabolic panel  Result Value Ref Range   Sodium 144 135 - 145 mmol/L   Potassium 4.1 3.5 - 5.1 mmol/L   Chloride 106 98 - 111 mmol/L   CO2 31 22 - 32 mmol/L   Glucose, Bld 91 70 - 99 mg/dL   BUN 25 (H) 8 - 23 mg/dL   Creatinine, Ser 0.91 0.61 - 1.24 mg/dL   Calcium 10.1 8.9 - 10.3 mg/dL   Total Protein 6.1 (L) 6.5 - 8.1 g/dL   Albumin 3.5 3.5 - 5.0 g/dL   AST 18 15 - 41 U/L   ALT 5 0 - 44 U/L   Alkaline Phosphatase 38 38 - 126 U/L   Total Bilirubin 0.7 0.3 - 1.2 mg/dL   GFR, Estimated >60 >60 mL/min   Anion gap 7 5 - 15  Lipase, blood  Result Value Ref Range   Lipase  33 11 - 51 U/L  Lactic acid, plasma  Result Value Ref Range   Lactic Acid, Venous 1.1 0.5 - 1.9 mmol/L   Urinalysis, Routine w reflex microscopic  Result Value Ref Range   Color, Urine YELLOW (A) YELLOW   APPearance HAZY (A) CLEAR   Specific Gravity, Urine 1.017 1.005 - 1.030   pH 7.0 5.0 - 8.0   Glucose, UA NEGATIVE NEGATIVE mg/dL   Hgb urine dipstick NEGATIVE NEGATIVE   Bilirubin Urine NEGATIVE NEGATIVE   Ketones, ur 5 (A) NEGATIVE mg/dL   Protein, ur NEGATIVE NEGATIVE mg/dL   Nitrite NEGATIVE NEGATIVE   Leukocytes,Ua NEGATIVE NEGATIVE  Troponin I (High Sensitivity)  Result Value Ref Range   Troponin I (High Sensitivity) 7 <18 ng/L    Assessment & Plan:   Problem List Items Addressed This Visit       Unprioritized   Chronic constipation    Some improvement on current bowel regimen of sennakot, prune juice, miralax, fiber supplement. Discussed bran recipe, written in patient instructions.       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       Scalp laceration, initial encounter - Primary    Edges well approximated, healing well, no drainage or bleeding. Anticipate too soon for staple removal - they will return Monday for this.       Fall with injury    Scalp laceration after fall at home.  Reassuring evaluation at ER.  He had just completed HHPT course.  He is at continued risk of falls due to unsteadiness from PD.         No orders of the defined types were placed in this encounter.  No orders of the defined types were placed in this encounter.    Patient Instructions  Touch base with neurology about gabapentin dosing vs restarting melatonin for sleep. Return Monday for staple removal.   You can try this bran recipe to help with constipation: Miller's (unprocessed wheat) Bran*  1 cup Applesauce                                       cup Prune Juice                                       cup   Mix these ingredients together and refrigerate. Replace  the mixture each week. Take 1 - 2 Tablespoons daily for one week for desired results. If needed, you may increase dose by 1 Tablespoon each week. Stool frequency and gas may increase the first few weeks but will usually adjust after one month.   *Miller's Bran is unprocessed wheat bran. This may be purchased at most large grocery stores and is found with either the hot cereals or flours and baking goods. The most commonly found brand name is Edyth Gunnels and it comes in a brown 14 oz. box. Miller's Bran may also be purchased in bulk at health food stores.  Follow up plan: Return in about 5 days (around 05/15/2022), or if symptoms worsen or fail to improve, for follow up visit.  Ria Bush, MD

## 2022-05-10 NOTE — Patient Instructions (Addendum)
Touch base with neurology about gabapentin dosing vs restarting melatonin for sleep. Return Monday for staple removal.   You can try this bran recipe to help with constipation: Miller's (unprocessed wheat) Bran*  1 cup Applesauce                                       cup Prune Juice                                       cup   Mix these ingredients together and refrigerate. Replace the mixture each week. Take 1 - 2 Tablespoons daily for one week for desired results. If needed, you may increase dose by 1 Tablespoon each week. Stool frequency and gas may increase the first few weeks but will usually adjust after one month.   *Miller's Bran is unprocessed wheat bran. This may be purchased at most large grocery stores and is found with either the hot cereals or flours and baking goods. The most commonly found brand name is Edyth Gunnels and it comes in a brown 14 oz. box. Miller's Bran may also be purchased in bulk at health food stores.

## 2022-05-11 ENCOUNTER — Encounter: Payer: Self-pay | Admitting: Family Medicine

## 2022-05-11 DIAGNOSIS — S0101XD Laceration without foreign body of scalp, subsequent encounter: Secondary | ICD-10-CM | POA: Insufficient documentation

## 2022-05-11 DIAGNOSIS — W19XXXA Unspecified fall, initial encounter: Secondary | ICD-10-CM | POA: Insufficient documentation

## 2022-05-11 DIAGNOSIS — R296 Repeated falls: Secondary | ICD-10-CM | POA: Insufficient documentation

## 2022-05-11 DIAGNOSIS — S0101XA Laceration without foreign body of scalp, initial encounter: Secondary | ICD-10-CM | POA: Insufficient documentation

## 2022-05-11 NOTE — Assessment & Plan Note (Signed)
Some improvement on current bowel regimen of sennakot, prune juice, miralax, fiber supplement. Discussed bran recipe, written in patient instructions.

## 2022-05-11 NOTE — Assessment & Plan Note (Signed)
May need to consider tapering lisinopril '20mg'$  daily.

## 2022-05-11 NOTE — Assessment & Plan Note (Signed)
Edges well approximated, healing well, no drainage or bleeding. Anticipate too soon for staple removal - they will return Monday for this.

## 2022-05-11 NOTE — Assessment & Plan Note (Addendum)
Scalp laceration after fall at home.  Reassuring evaluation at ER.  He had just completed HHPT course.  He is at continued risk of falls due to unsteadiness from PD.

## 2022-05-15 ENCOUNTER — Encounter: Payer: Self-pay | Admitting: Family Medicine

## 2022-05-15 ENCOUNTER — Ambulatory Visit (INDEPENDENT_AMBULATORY_CARE_PROVIDER_SITE_OTHER): Payer: Medicare Other | Admitting: Family Medicine

## 2022-05-15 VITALS — BP 136/70 | HR 58 | Temp 97.2°F | Ht 70.0 in | Wt 208.2 lb

## 2022-05-15 DIAGNOSIS — S0101XD Laceration without foreign body of scalp, subsequent encounter: Secondary | ICD-10-CM

## 2022-05-15 DIAGNOSIS — Z4802 Encounter for removal of sutures: Secondary | ICD-10-CM | POA: Diagnosis not present

## 2022-05-15 NOTE — Progress Notes (Signed)
Patient ID: Joshua Everett, male    DOB: [birth year], 87 y.o.   MRN: [id number]  This visit was conducted in person.  BP 136/70   Pulse (!) 58   Temp (!) 97.2 F (36.2 C) (Temporal)   Ht _0  (1.778 m)   Wt 208 lb 3.2 oz (94.4 kg)   SpO2 95%   BMI 29.87 kg/m    CC: staple removal Subjective:   HPI: Joshua Everett is a 87 y.o. male presenting on 05/15/2022 for Suture / Staple Removal (Here for 5 day wound f/u and to have staples removed. Pt accompanied by wife, [name]. )    See prior note for details. Here for 5 staple removal.   Upcoming neurology appt 05/17/2022.  Neurology recommends gait belt use.      Relevant past medical, surgical, family and social history reviewed and updated as indicated. Interim medical history since our last visit reviewed. Allergies and medications reviewed and updated. Outpatient Medications Prior to Visit  Medication Sig Dispense Refill   aspirin EC 81 MG tablet Take 81 mg by mouth daily. Swallow whole.     bimatoprost (LUMIGAN) 0.01 % SOLN Place 1 drop into both eyes at bedtime.      carbidopa-levodopa (SINEMET IR) 25-100 MG tablet Take 2 tablets by mouth 4 (four) times daily.     cholecalciferol (VITAMIN D) 1000 units tablet Take 1,000 Units by mouth daily.      Cyanocobalamin (VITAMIN B 12 PO) Take by mouth daily.     cycloSPORINE (RESTASIS) 0.05 % ophthalmic emulsion Place 1 drop into both eyes 2 (two) times daily.     Fiber Complete TABS Take 2 tablets by mouth daily.     gabapentin (NEURONTIN) 100 MG capsule Take 100 mg by mouth at bedtime.     lisinopril (ZESTRIL) 20 MG tablet Take 1 tablet (20 mg total) by mouth daily. 90 tablet 3   Multiple Vitamins-Minerals (MULTIVITAMIN ADULT PO) Take 1 tablet by mouth daily.     polyethylene glycol powder (GLYCOLAX/MIRALAX) 17 GM/SCOOP powder Take 17 g by mouth daily as needed for moderate constipation.     sennosides-docusate sodium (SENOKOT-S) 8.6-50 MG tablet Take 1 tablet by mouth  daily.     No facility-administered medications prior to visit.     Per HPI unless specifically indicated in ROS section below Review of Systems  Objective:  BP 136/70   Pulse (!) 58   Temp (!) 97.2 F (36.2 C) (Temporal)   Ht _1  (1.778 m)   Wt 208 lb 3.2 oz (94.4 kg)   SpO2 95%   BMI 29.87 kg/m   Wt Readings from Last 3 Encounters:  05/15/22 208 lb 3.2 oz (94.4 kg)  05/10/22 208 lb (94.3 kg)  05/03/22 198 lb (89.8 kg)      Physical Exam Vitals and nursing note reviewed.  Constitutional:      Appearance: Normal appearance. He is not ill-appearing.  HENT:     Head: Normocephalic.   Neurological:     Mental Status: He is alert.     Staple removal: Surrounding skin cleaned with alcohol pad. Using staple removal kit, 4 staples removed - 5th one must have fallen off on its own. Pt tolerated well.       Assessment & Plan:   Problem List Items Addressed This Visit       Unprioritized   Scalp laceration, subsequent encounter - Primary    Staples removed 4. Pt tolerated well.  Wound edges well approximated without erythema, drainage.         No orders of the defined types were placed in this encounter.  No orders of the defined types were placed in this encounter.    Patient Instructions  Staples removed.  Good to see you today!   Follow up plan: No follow-ups on file.  Ria Bush, MD

## 2022-05-15 NOTE — Assessment & Plan Note (Addendum)
Staples removed 4. Pt tolerated well.  Wound edges well approximated without erythema, drainage.

## 2022-05-15 NOTE — Patient Instructions (Signed)
Staples removed.  Good to see you today!

## 2022-05-17 DIAGNOSIS — R569 Unspecified convulsions: Secondary | ICD-10-CM | POA: Diagnosis not present

## 2022-05-17 DIAGNOSIS — G20A1 Parkinson's disease without dyskinesia, without mention of fluctuations: Secondary | ICD-10-CM | POA: Diagnosis not present

## 2022-05-17 DIAGNOSIS — F0280 Dementia in other diseases classified elsewhere without behavioral disturbance: Secondary | ICD-10-CM | POA: Diagnosis not present

## 2022-05-22 ENCOUNTER — Ambulatory Visit: Payer: Medicare Other | Admitting: Dermatology

## 2022-05-31 DIAGNOSIS — R32 Unspecified urinary incontinence: Secondary | ICD-10-CM | POA: Diagnosis not present

## 2022-05-31 DIAGNOSIS — Z87891 Personal history of nicotine dependence: Secondary | ICD-10-CM | POA: Diagnosis not present

## 2022-05-31 DIAGNOSIS — I10 Essential (primary) hypertension: Secondary | ICD-10-CM | POA: Diagnosis not present

## 2022-05-31 DIAGNOSIS — F02818 Dementia in other diseases classified elsewhere, unspecified severity, with other behavioral disturbance: Secondary | ICD-10-CM | POA: Diagnosis not present

## 2022-05-31 DIAGNOSIS — N3281 Overactive bladder: Secondary | ICD-10-CM | POA: Diagnosis not present

## 2022-05-31 DIAGNOSIS — R7303 Prediabetes: Secondary | ICD-10-CM | POA: Diagnosis not present

## 2022-05-31 DIAGNOSIS — F05 Delirium due to known physiological condition: Secondary | ICD-10-CM | POA: Diagnosis not present

## 2022-05-31 DIAGNOSIS — Z7982 Long term (current) use of aspirin: Secondary | ICD-10-CM | POA: Diagnosis not present

## 2022-05-31 DIAGNOSIS — Z9181 History of falling: Secondary | ICD-10-CM | POA: Diagnosis not present

## 2022-05-31 DIAGNOSIS — G20A1 Parkinson's disease without dyskinesia, without mention of fluctuations: Secondary | ICD-10-CM | POA: Diagnosis not present

## 2022-05-31 DIAGNOSIS — R296 Repeated falls: Secondary | ICD-10-CM | POA: Diagnosis not present

## 2022-05-31 DIAGNOSIS — G4700 Insomnia, unspecified: Secondary | ICD-10-CM | POA: Diagnosis not present

## 2022-05-31 DIAGNOSIS — E785 Hyperlipidemia, unspecified: Secondary | ICD-10-CM | POA: Diagnosis not present

## 2022-06-02 ENCOUNTER — Telehealth: Payer: Self-pay | Admitting: Family Medicine

## 2022-06-02 DIAGNOSIS — R7303 Prediabetes: Secondary | ICD-10-CM | POA: Diagnosis not present

## 2022-06-02 DIAGNOSIS — F05 Delirium due to known physiological condition: Secondary | ICD-10-CM | POA: Diagnosis not present

## 2022-06-02 DIAGNOSIS — I10 Essential (primary) hypertension: Secondary | ICD-10-CM | POA: Diagnosis not present

## 2022-06-02 DIAGNOSIS — G20A1 Parkinson's disease without dyskinesia, without mention of fluctuations: Secondary | ICD-10-CM | POA: Diagnosis not present

## 2022-06-02 DIAGNOSIS — F02818 Dementia in other diseases classified elsewhere, unspecified severity, with other behavioral disturbance: Secondary | ICD-10-CM | POA: Diagnosis not present

## 2022-06-02 DIAGNOSIS — N3281 Overactive bladder: Secondary | ICD-10-CM | POA: Diagnosis not present

## 2022-06-02 NOTE — Telephone Encounter (Signed)
Spoke with pt's wife, [name] (on dpr), relaying Dr. Synthia Innocent message. She verbalizes understanding and will try to get that reading in the next few mins and call back before 5:00. If not, she will send reading in a MyChart message.

## 2022-06-02 NOTE — Telephone Encounter (Signed)
[facility] called in to report vitals on  patient:  b/p-192/70 Heart rate :46

## 2022-06-02 NOTE — Telephone Encounter (Signed)
Has parkinson disease. If BP sitting elevated >160/100, please have them check BP after 2 min standing and call us with readings.

## 2022-06-08 DIAGNOSIS — F02818 Dementia in other diseases classified elsewhere, unspecified severity, with other behavioral disturbance: Secondary | ICD-10-CM | POA: Diagnosis not present

## 2022-06-08 DIAGNOSIS — F05 Delirium due to known physiological condition: Secondary | ICD-10-CM | POA: Diagnosis not present

## 2022-06-08 DIAGNOSIS — N3281 Overactive bladder: Secondary | ICD-10-CM | POA: Diagnosis not present

## 2022-06-08 DIAGNOSIS — I10 Essential (primary) hypertension: Secondary | ICD-10-CM | POA: Diagnosis not present

## 2022-06-08 DIAGNOSIS — G20A1 Parkinson's disease without dyskinesia, without mention of fluctuations: Secondary | ICD-10-CM | POA: Diagnosis not present

## 2022-06-08 DIAGNOSIS — R7303 Prediabetes: Secondary | ICD-10-CM | POA: Diagnosis not present

## 2022-06-13 DIAGNOSIS — G20A1 Parkinson's disease without dyskinesia, without mention of fluctuations: Secondary | ICD-10-CM | POA: Diagnosis not present

## 2022-06-13 DIAGNOSIS — F02818 Dementia in other diseases classified elsewhere, unspecified severity, with other behavioral disturbance: Secondary | ICD-10-CM | POA: Diagnosis not present

## 2022-06-13 DIAGNOSIS — F5101 Primary insomnia: Secondary | ICD-10-CM | POA: Diagnosis not present

## 2022-06-13 DIAGNOSIS — N3281 Overactive bladder: Secondary | ICD-10-CM | POA: Diagnosis not present

## 2022-06-13 DIAGNOSIS — G903 Multi-system degeneration of the autonomic nervous system: Secondary | ICD-10-CM | POA: Diagnosis not present

## 2022-06-13 DIAGNOSIS — I10 Essential (primary) hypertension: Secondary | ICD-10-CM | POA: Diagnosis not present

## 2022-06-13 DIAGNOSIS — F0280 Dementia in other diseases classified elsewhere without behavioral disturbance: Secondary | ICD-10-CM | POA: Diagnosis not present

## 2022-06-13 DIAGNOSIS — R569 Unspecified convulsions: Secondary | ICD-10-CM | POA: Diagnosis not present

## 2022-06-13 DIAGNOSIS — R7303 Prediabetes: Secondary | ICD-10-CM | POA: Diagnosis not present

## 2022-06-13 DIAGNOSIS — F05 Delirium due to known physiological condition: Secondary | ICD-10-CM | POA: Diagnosis not present

## 2022-06-14 ENCOUNTER — Telehealth: Payer: Self-pay | Admitting: Family Medicine

## 2022-06-14 DIAGNOSIS — N3281 Overactive bladder: Secondary | ICD-10-CM | POA: Diagnosis not present

## 2022-06-14 DIAGNOSIS — I10 Essential (primary) hypertension: Secondary | ICD-10-CM | POA: Diagnosis not present

## 2022-06-14 DIAGNOSIS — F05 Delirium due to known physiological condition: Secondary | ICD-10-CM | POA: Diagnosis not present

## 2022-06-14 DIAGNOSIS — R7303 Prediabetes: Secondary | ICD-10-CM | POA: Diagnosis not present

## 2022-06-14 DIAGNOSIS — F02818 Dementia in other diseases classified elsewhere, unspecified severity, with other behavioral disturbance: Secondary | ICD-10-CM | POA: Diagnosis not present

## 2022-06-14 DIAGNOSIS — G20A1 Parkinson's disease without dyskinesia, without mention of fluctuations: Secondary | ICD-10-CM | POA: Diagnosis not present

## 2022-06-14 NOTE — Telephone Encounter (Signed)
Spoke to [place] with [facility] and was advised that patient had a small scratch on the top of his nose close to his orbital area. Hilliard Clark said he was thinking that maybe his glasses did it because he actually fell on his back. Hilliard Clark stated that patient did have a very small contusion on the back of his head Hilliard Clark stated that the patient was able to walk and move around today.Hilliard Clark said that he was reporting the fall to Dr. Danise Mina so that he would be aware.

## 2022-06-14 NOTE — Telephone Encounter (Signed)
Noted! Thank you

## 2022-06-14 NOTE — Telephone Encounter (Signed)
Joshua Everett from [facility] called to let Dr. Darnell Level know the pt took a fall yesterday, 1/9, around [place] stated the pt started to walk without his walker & tried holding into a door knob, while holding the door knob, he put all his weigh on the knob & the door came open causing the pt to fall backwards. Call back # [phone], secured

## 2022-06-15 DIAGNOSIS — G20A1 Parkinson's disease without dyskinesia, without mention of fluctuations: Secondary | ICD-10-CM | POA: Diagnosis not present

## 2022-06-15 DIAGNOSIS — F02818 Dementia in other diseases classified elsewhere, unspecified severity, with other behavioral disturbance: Secondary | ICD-10-CM | POA: Diagnosis not present

## 2022-06-15 DIAGNOSIS — F05 Delirium due to known physiological condition: Secondary | ICD-10-CM | POA: Diagnosis not present

## 2022-06-15 DIAGNOSIS — N3281 Overactive bladder: Secondary | ICD-10-CM | POA: Diagnosis not present

## 2022-06-15 DIAGNOSIS — I10 Essential (primary) hypertension: Secondary | ICD-10-CM | POA: Diagnosis not present

## 2022-06-15 DIAGNOSIS — R7303 Prediabetes: Secondary | ICD-10-CM | POA: Diagnosis not present

## 2022-06-16 DIAGNOSIS — I10 Essential (primary) hypertension: Secondary | ICD-10-CM | POA: Diagnosis not present

## 2022-06-16 DIAGNOSIS — N3281 Overactive bladder: Secondary | ICD-10-CM | POA: Diagnosis not present

## 2022-06-16 DIAGNOSIS — F02818 Dementia in other diseases classified elsewhere, unspecified severity, with other behavioral disturbance: Secondary | ICD-10-CM | POA: Diagnosis not present

## 2022-06-16 DIAGNOSIS — F05 Delirium due to known physiological condition: Secondary | ICD-10-CM | POA: Diagnosis not present

## 2022-06-16 DIAGNOSIS — R7303 Prediabetes: Secondary | ICD-10-CM | POA: Diagnosis not present

## 2022-06-16 DIAGNOSIS — G20A1 Parkinson's disease without dyskinesia, without mention of fluctuations: Secondary | ICD-10-CM | POA: Diagnosis not present

## 2022-06-19 ENCOUNTER — Encounter: Payer: Self-pay | Admitting: Family Medicine

## 2022-06-19 ENCOUNTER — Ambulatory Visit (INDEPENDENT_AMBULATORY_CARE_PROVIDER_SITE_OTHER): Payer: Medicare Other | Admitting: Family Medicine

## 2022-06-19 VITALS — BP 134/76 | HR 51 | Temp 97.3°F | Ht 70.0 in | Wt 210.2 lb

## 2022-06-19 DIAGNOSIS — H6120 Impacted cerumen, unspecified ear: Secondary | ICD-10-CM | POA: Insufficient documentation

## 2022-06-19 DIAGNOSIS — R296 Repeated falls: Secondary | ICD-10-CM | POA: Diagnosis not present

## 2022-06-19 DIAGNOSIS — R7303 Prediabetes: Secondary | ICD-10-CM | POA: Diagnosis not present

## 2022-06-19 DIAGNOSIS — F02818 Dementia in other diseases classified elsewhere, unspecified severity, with other behavioral disturbance: Secondary | ICD-10-CM | POA: Diagnosis not present

## 2022-06-19 DIAGNOSIS — H6123 Impacted cerumen, bilateral: Secondary | ICD-10-CM | POA: Diagnosis not present

## 2022-06-19 DIAGNOSIS — G20A1 Parkinson's disease without dyskinesia, without mention of fluctuations: Secondary | ICD-10-CM | POA: Diagnosis not present

## 2022-06-19 DIAGNOSIS — I10 Essential (primary) hypertension: Secondary | ICD-10-CM | POA: Diagnosis not present

## 2022-06-19 DIAGNOSIS — N3281 Overactive bladder: Secondary | ICD-10-CM | POA: Diagnosis not present

## 2022-06-19 DIAGNOSIS — F05 Delirium due to known physiological condition: Secondary | ICD-10-CM | POA: Diagnosis not present

## 2022-06-19 NOTE — Assessment & Plan Note (Signed)
High fall risk, has had 3 falls in the past week, fortunately none with significant injury.  He is receiving HHPT and working with upright walker with benefit.  Will continue this.  Palliative care is establishing - discussed palliative care services.

## 2022-06-19 NOTE — Progress Notes (Signed)
Patient ID: Joshua Everett, male    DOB: [birth year], 88 y.o.   MRN: [id number]  This visit was conducted in person.  BP 134/76   Pulse (!) 51   Temp (!) 97.3 F (36.3 C) (Temporal)   Ht '5\' 10"'$  (1.778 m)   Wt 210 lb 4 oz (95.4 kg)   SpO2 97%   BMI 30.17 kg/m    CC: fall at home, check R ear  Subjective:   HPI: Joshua Everett is a 88 y.o. male presenting on 06/19/2022 for Fall (Pt fell at home 06/15/22 at home. Fell in bedroom injury forehead. Also, c/o R ear pain. Per neurology, wax buildup. Pt accompanied by wife, [name]. )   DOI: Sunday 06/18/2022  Fall in bedroom, collapsed onto floor. Wife was unable to get him up after 40 min trying, had to call EMS. Fortunately no injury. Stayed weak in wheelchair all day. Today saw HHPT, did really well, now using upright walker.   Previous falls 06/13/2022 trying to hold onto door knob, slipped when door started moving, with resultant scratch to nose. Also suffered contusion to back of head at that time. Alao fell 06/15/2022.  Last saw neurology 06/13/2022, has been referred to palliative care.  Continues HHPT and SN as well as sinemet 25/'100mg'$  2 pills QID.   Was having ear pain when he saw neurology, but not currently. Found to have bilateral cerumen impaction and requests cleaning today.      Relevant past medical, surgical, family and social history reviewed and updated as indicated. Interim medical history since our last visit reviewed. Allergies and medications reviewed and updated. Outpatient Medications Prior to Visit  Medication Sig Dispense Refill   aspirin EC 81 MG tablet Take 81 mg by mouth daily. Swallow whole.     bimatoprost (LUMIGAN) 0.01 % SOLN Place 1 drop into both eyes at bedtime.      carbidopa-levodopa (SINEMET IR) 25-100 MG tablet Take 2 tablets by mouth 4 (four) times daily.     cholecalciferol (VITAMIN D) 1000 units tablet Take 1,000 Units by mouth daily.      Cyanocobalamin (VITAMIN B 12 PO) Take by mouth  daily.     cycloSPORINE (RESTASIS) 0.05 % ophthalmic emulsion Place 1 drop into both eyes 2 (two) times daily.     Fiber Complete TABS Take 2 tablets by mouth daily.     gabapentin (NEURONTIN) 100 MG capsule Take 100 mg by mouth at bedtime.     lisinopril (ZESTRIL) 20 MG tablet Take 1 tablet (20 mg total) by mouth daily. 90 tablet 3   Multiple Vitamins-Minerals (MULTIVITAMIN ADULT PO) Take 1 tablet by mouth daily.     polyethylene glycol powder (GLYCOLAX/MIRALAX) 17 GM/SCOOP powder Take 17 g by mouth daily as needed for moderate constipation.     sennosides-docusate sodium (SENOKOT-S) 8.6-50 MG tablet Take 1 tablet by mouth daily.     No facility-administered medications prior to visit.     Per HPI unless specifically indicated in ROS section below Review of Systems  Objective:  BP 134/76   Pulse (!) 51   Temp (!) 97.3 F (36.3 C) (Temporal)   Ht '5\' 10"'$  (1.778 m)   Wt 210 lb 4 oz (95.4 kg)   SpO2 97%   BMI 30.17 kg/m   Wt Readings from Last 3 Encounters:  06/19/22 210 lb 4 oz (95.4 kg)  05/15/22 208 lb 3.2 oz (94.4 kg)  05/10/22 208 lb (94.3 kg)  Physical Exam Vitals and nursing note reviewed.  Constitutional:      Appearance: Normal appearance. He is not ill-appearing.  HENT:     Head: Normocephalic and atraumatic.     Right Ear: Hearing and external ear normal. There is impacted cerumen.     Left Ear: Hearing and external ear normal. There is impacted cerumen.     Ears:     Comments: Cerumen impaction bilaterally Musculoskeletal:     Right lower leg: No edema.     Left lower leg: No edema.  Skin:    General: Skin is warm and dry.     Findings: No rash.  Neurological:     Mental Status: He is alert.  Psychiatric:        Mood and Affect: Mood normal.        Behavior: Behavior normal.       Assessment & Plan:   Problem List Items Addressed This Visit     Recurrent falls    High fall risk, has had 3 falls in the past week, fortunately none with  significant injury.  He is receiving HHPT and working with upright walker with benefit.  Will continue this.  Palliative care is establishing - discussed palliative care services.       Cerumen impaction - Primary    Cerumen irrigation performed bilaterally, more successful on right with subsequent removal of dry wax with forceps.  Incompletely removed on left - they will continue treatment at home with debrox ear drops.         No orders of the defined types were placed in this encounter.   No orders of the defined types were placed in this encounter.   Patient Instructions  Good to see you today Ear irrigation performed today.   Follow up plan: No follow-ups on file.  Ria Bush, MD

## 2022-06-19 NOTE — Assessment & Plan Note (Signed)
Cerumen irrigation performed bilaterally, more successful on right with subsequent removal of dry wax with forceps.  Incompletely removed on left - they will continue treatment at home with debrox ear drops.

## 2022-06-19 NOTE — Patient Instructions (Signed)
Good to see you today Ear irrigation performed today.

## 2022-06-20 DIAGNOSIS — I10 Essential (primary) hypertension: Secondary | ICD-10-CM | POA: Diagnosis not present

## 2022-06-20 DIAGNOSIS — F05 Delirium due to known physiological condition: Secondary | ICD-10-CM | POA: Diagnosis not present

## 2022-06-20 DIAGNOSIS — G20A1 Parkinson's disease without dyskinesia, without mention of fluctuations: Secondary | ICD-10-CM | POA: Diagnosis not present

## 2022-06-20 DIAGNOSIS — F02818 Dementia in other diseases classified elsewhere, unspecified severity, with other behavioral disturbance: Secondary | ICD-10-CM | POA: Diagnosis not present

## 2022-06-20 DIAGNOSIS — R7303 Prediabetes: Secondary | ICD-10-CM | POA: Diagnosis not present

## 2022-06-20 DIAGNOSIS — N3281 Overactive bladder: Secondary | ICD-10-CM | POA: Diagnosis not present

## 2022-06-22 DIAGNOSIS — F05 Delirium due to known physiological condition: Secondary | ICD-10-CM | POA: Diagnosis not present

## 2022-06-22 DIAGNOSIS — I10 Essential (primary) hypertension: Secondary | ICD-10-CM | POA: Diagnosis not present

## 2022-06-22 DIAGNOSIS — R7303 Prediabetes: Secondary | ICD-10-CM | POA: Diagnosis not present

## 2022-06-22 DIAGNOSIS — N3281 Overactive bladder: Secondary | ICD-10-CM | POA: Diagnosis not present

## 2022-06-22 DIAGNOSIS — F02818 Dementia in other diseases classified elsewhere, unspecified severity, with other behavioral disturbance: Secondary | ICD-10-CM | POA: Diagnosis not present

## 2022-06-22 DIAGNOSIS — G20A1 Parkinson's disease without dyskinesia, without mention of fluctuations: Secondary | ICD-10-CM | POA: Diagnosis not present

## 2022-06-26 DIAGNOSIS — F02818 Dementia in other diseases classified elsewhere, unspecified severity, with other behavioral disturbance: Secondary | ICD-10-CM | POA: Diagnosis not present

## 2022-06-26 DIAGNOSIS — N3281 Overactive bladder: Secondary | ICD-10-CM | POA: Diagnosis not present

## 2022-06-26 DIAGNOSIS — G20A1 Parkinson's disease without dyskinesia, without mention of fluctuations: Secondary | ICD-10-CM | POA: Diagnosis not present

## 2022-06-26 DIAGNOSIS — R7303 Prediabetes: Secondary | ICD-10-CM | POA: Diagnosis not present

## 2022-06-26 DIAGNOSIS — I10 Essential (primary) hypertension: Secondary | ICD-10-CM | POA: Diagnosis not present

## 2022-06-26 DIAGNOSIS — F05 Delirium due to known physiological condition: Secondary | ICD-10-CM | POA: Diagnosis not present

## 2022-06-27 DIAGNOSIS — F02818 Dementia in other diseases classified elsewhere, unspecified severity, with other behavioral disturbance: Secondary | ICD-10-CM | POA: Diagnosis not present

## 2022-06-27 DIAGNOSIS — R7303 Prediabetes: Secondary | ICD-10-CM | POA: Diagnosis not present

## 2022-06-27 DIAGNOSIS — G20A1 Parkinson's disease without dyskinesia, without mention of fluctuations: Secondary | ICD-10-CM | POA: Diagnosis not present

## 2022-06-27 DIAGNOSIS — F05 Delirium due to known physiological condition: Secondary | ICD-10-CM | POA: Diagnosis not present

## 2022-06-27 DIAGNOSIS — I10 Essential (primary) hypertension: Secondary | ICD-10-CM | POA: Diagnosis not present

## 2022-06-27 DIAGNOSIS — N3281 Overactive bladder: Secondary | ICD-10-CM | POA: Diagnosis not present

## 2022-06-30 DIAGNOSIS — F02818 Dementia in other diseases classified elsewhere, unspecified severity, with other behavioral disturbance: Secondary | ICD-10-CM | POA: Diagnosis not present

## 2022-06-30 DIAGNOSIS — R296 Repeated falls: Secondary | ICD-10-CM | POA: Diagnosis not present

## 2022-06-30 DIAGNOSIS — R32 Unspecified urinary incontinence: Secondary | ICD-10-CM | POA: Diagnosis not present

## 2022-06-30 DIAGNOSIS — Z87891 Personal history of nicotine dependence: Secondary | ICD-10-CM | POA: Diagnosis not present

## 2022-06-30 DIAGNOSIS — Z9181 History of falling: Secondary | ICD-10-CM | POA: Diagnosis not present

## 2022-06-30 DIAGNOSIS — Z7982 Long term (current) use of aspirin: Secondary | ICD-10-CM | POA: Diagnosis not present

## 2022-06-30 DIAGNOSIS — G4700 Insomnia, unspecified: Secondary | ICD-10-CM | POA: Diagnosis not present

## 2022-06-30 DIAGNOSIS — F05 Delirium due to known physiological condition: Secondary | ICD-10-CM | POA: Diagnosis not present

## 2022-06-30 DIAGNOSIS — G20A1 Parkinson's disease without dyskinesia, without mention of fluctuations: Secondary | ICD-10-CM | POA: Diagnosis not present

## 2022-06-30 DIAGNOSIS — N3281 Overactive bladder: Secondary | ICD-10-CM | POA: Diagnosis not present

## 2022-06-30 DIAGNOSIS — R7303 Prediabetes: Secondary | ICD-10-CM | POA: Diagnosis not present

## 2022-06-30 DIAGNOSIS — E785 Hyperlipidemia, unspecified: Secondary | ICD-10-CM | POA: Diagnosis not present

## 2022-06-30 DIAGNOSIS — I10 Essential (primary) hypertension: Secondary | ICD-10-CM | POA: Diagnosis not present

## 2022-07-04 DIAGNOSIS — R7303 Prediabetes: Secondary | ICD-10-CM | POA: Diagnosis not present

## 2022-07-04 DIAGNOSIS — F02818 Dementia in other diseases classified elsewhere, unspecified severity, with other behavioral disturbance: Secondary | ICD-10-CM | POA: Diagnosis not present

## 2022-07-04 DIAGNOSIS — I10 Essential (primary) hypertension: Secondary | ICD-10-CM | POA: Diagnosis not present

## 2022-07-04 DIAGNOSIS — N3281 Overactive bladder: Secondary | ICD-10-CM | POA: Diagnosis not present

## 2022-07-04 DIAGNOSIS — G20A1 Parkinson's disease without dyskinesia, without mention of fluctuations: Secondary | ICD-10-CM | POA: Diagnosis not present

## 2022-07-04 DIAGNOSIS — F05 Delirium due to known physiological condition: Secondary | ICD-10-CM | POA: Diagnosis not present

## 2022-07-05 DIAGNOSIS — G20A1 Parkinson's disease without dyskinesia, without mention of fluctuations: Secondary | ICD-10-CM | POA: Diagnosis not present

## 2022-07-05 DIAGNOSIS — N3281 Overactive bladder: Secondary | ICD-10-CM | POA: Diagnosis not present

## 2022-07-05 DIAGNOSIS — F05 Delirium due to known physiological condition: Secondary | ICD-10-CM | POA: Diagnosis not present

## 2022-07-05 DIAGNOSIS — R7303 Prediabetes: Secondary | ICD-10-CM | POA: Diagnosis not present

## 2022-07-05 DIAGNOSIS — F02818 Dementia in other diseases classified elsewhere, unspecified severity, with other behavioral disturbance: Secondary | ICD-10-CM | POA: Diagnosis not present

## 2022-07-05 DIAGNOSIS — I10 Essential (primary) hypertension: Secondary | ICD-10-CM | POA: Diagnosis not present

## 2022-07-06 ENCOUNTER — Emergency Department: Payer: Medicare Other

## 2022-07-06 ENCOUNTER — Other Ambulatory Visit: Payer: Self-pay

## 2022-07-06 ENCOUNTER — Inpatient Hospital Stay: Payer: Medicare Other

## 2022-07-06 ENCOUNTER — Inpatient Hospital Stay
Admission: EM | Admit: 2022-07-06 | Discharge: 2022-07-14 | DRG: 056 | Disposition: A | Discharge status: Skilled Nursing Facility | Payer: Medicare Other | Attending: Student | Admitting: Student

## 2022-07-06 DIAGNOSIS — Z6830 Body mass index (BMI) 30.0-30.9, adult: Secondary | ICD-10-CM

## 2022-07-06 DIAGNOSIS — R2689 Other abnormalities of gait and mobility: Secondary | ICD-10-CM | POA: Diagnosis present

## 2022-07-06 DIAGNOSIS — F0280 Dementia in other diseases classified elsewhere without behavioral disturbance: Secondary | ICD-10-CM | POA: Diagnosis present

## 2022-07-06 DIAGNOSIS — F0390 Unspecified dementia without behavioral disturbance: Secondary | ICD-10-CM | POA: Diagnosis not present

## 2022-07-06 DIAGNOSIS — M4312 Spondylolisthesis, cervical region: Secondary | ICD-10-CM | POA: Diagnosis not present

## 2022-07-06 DIAGNOSIS — K5909 Other constipation: Secondary | ICD-10-CM | POA: Diagnosis present

## 2022-07-06 DIAGNOSIS — S51012S Laceration without foreign body of left elbow, sequela: Secondary | ICD-10-CM | POA: Diagnosis not present

## 2022-07-06 DIAGNOSIS — H409 Unspecified glaucoma: Secondary | ICD-10-CM | POA: Diagnosis present

## 2022-07-06 DIAGNOSIS — I2693 Single subsegmental pulmonary embolism without acute cor pulmonale: Secondary | ICD-10-CM | POA: Diagnosis not present

## 2022-07-06 DIAGNOSIS — R5381 Other malaise: Secondary | ICD-10-CM | POA: Diagnosis present

## 2022-07-06 DIAGNOSIS — N400 Enlarged prostate without lower urinary tract symptoms: Secondary | ICD-10-CM | POA: Diagnosis present

## 2022-07-06 DIAGNOSIS — M79604 Pain in right leg: Secondary | ICD-10-CM | POA: Diagnosis not present

## 2022-07-06 DIAGNOSIS — G903 Multi-system degeneration of the autonomic nervous system: Secondary | ICD-10-CM | POA: Diagnosis not present

## 2022-07-06 DIAGNOSIS — E785 Hyperlipidemia, unspecified: Secondary | ICD-10-CM | POA: Diagnosis present

## 2022-07-06 DIAGNOSIS — I951 Orthostatic hypotension: Secondary | ICD-10-CM | POA: Diagnosis present

## 2022-07-06 DIAGNOSIS — I10 Essential (primary) hypertension: Secondary | ICD-10-CM | POA: Diagnosis present

## 2022-07-06 DIAGNOSIS — R410 Disorientation, unspecified: Secondary | ICD-10-CM | POA: Insufficient documentation

## 2022-07-06 DIAGNOSIS — M6281 Muscle weakness (generalized): Secondary | ICD-10-CM | POA: Diagnosis present

## 2022-07-06 DIAGNOSIS — R531 Weakness: Secondary | ICD-10-CM | POA: Diagnosis present

## 2022-07-06 DIAGNOSIS — Z1152 Encounter for screening for COVID-19: Secondary | ICD-10-CM | POA: Diagnosis not present

## 2022-07-06 DIAGNOSIS — N3281 Overactive bladder: Secondary | ICD-10-CM | POA: Diagnosis present

## 2022-07-06 DIAGNOSIS — R296 Repeated falls: Secondary | ICD-10-CM

## 2022-07-06 DIAGNOSIS — G20B1 Parkinson's disease with dyskinesia, without mention of fluctuations: Principal | ICD-10-CM | POA: Diagnosis present

## 2022-07-06 DIAGNOSIS — E669 Obesity, unspecified: Secondary | ICD-10-CM | POA: Insufficient documentation

## 2022-07-06 DIAGNOSIS — F05 Delirium due to known physiological condition: Secondary | ICD-10-CM | POA: Diagnosis not present

## 2022-07-06 DIAGNOSIS — W19XXXA Unspecified fall, initial encounter: Secondary | ICD-10-CM | POA: Diagnosis not present

## 2022-07-06 DIAGNOSIS — Z888 Allergy status to other drugs, medicaments and biological substances status: Secondary | ICD-10-CM

## 2022-07-06 DIAGNOSIS — R4182 Altered mental status, unspecified: Secondary | ICD-10-CM | POA: Diagnosis present

## 2022-07-06 DIAGNOSIS — Z85828 Personal history of other malignant neoplasm of skin: Secondary | ICD-10-CM | POA: Diagnosis not present

## 2022-07-06 DIAGNOSIS — M25561 Pain in right knee: Secondary | ICD-10-CM | POA: Diagnosis present

## 2022-07-06 DIAGNOSIS — L719 Rosacea, unspecified: Secondary | ICD-10-CM | POA: Diagnosis present

## 2022-07-06 DIAGNOSIS — I6381 Other cerebral infarction due to occlusion or stenosis of small artery: Secondary | ICD-10-CM | POA: Diagnosis not present

## 2022-07-06 DIAGNOSIS — R001 Bradycardia, unspecified: Secondary | ICD-10-CM | POA: Diagnosis present

## 2022-07-06 DIAGNOSIS — R2243 Localized swelling, mass and lump, lower limb, bilateral: Secondary | ICD-10-CM | POA: Diagnosis present

## 2022-07-06 DIAGNOSIS — I959 Hypotension, unspecified: Secondary | ICD-10-CM | POA: Diagnosis present

## 2022-07-06 DIAGNOSIS — Z7982 Long term (current) use of aspirin: Secondary | ICD-10-CM

## 2022-07-06 DIAGNOSIS — C4492 Squamous cell carcinoma of skin, unspecified: Secondary | ICD-10-CM | POA: Diagnosis present

## 2022-07-06 DIAGNOSIS — R55 Syncope and collapse: Secondary | ICD-10-CM | POA: Diagnosis present

## 2022-07-06 DIAGNOSIS — R1312 Dysphagia, oropharyngeal phase: Secondary | ICD-10-CM | POA: Diagnosis present

## 2022-07-06 DIAGNOSIS — F02A11 Dementia in other diseases classified elsewhere, mild, with agitation: Secondary | ICD-10-CM | POA: Diagnosis not present

## 2022-07-06 DIAGNOSIS — I2699 Other pulmonary embolism without acute cor pulmonale: Secondary | ICD-10-CM | POA: Diagnosis not present

## 2022-07-06 DIAGNOSIS — K219 Gastro-esophageal reflux disease without esophagitis: Secondary | ICD-10-CM | POA: Diagnosis present

## 2022-07-06 DIAGNOSIS — K5904 Chronic idiopathic constipation: Secondary | ICD-10-CM | POA: Diagnosis present

## 2022-07-06 DIAGNOSIS — C4491 Basal cell carcinoma of skin, unspecified: Secondary | ICD-10-CM | POA: Diagnosis present

## 2022-07-06 DIAGNOSIS — I6389 Other cerebral infarction: Secondary | ICD-10-CM | POA: Diagnosis not present

## 2022-07-06 DIAGNOSIS — Z79899 Other long term (current) drug therapy: Secondary | ICD-10-CM

## 2022-07-06 DIAGNOSIS — L97819 Non-pressure chronic ulcer of other part of right lower leg with unspecified severity: Secondary | ICD-10-CM | POA: Diagnosis present

## 2022-07-06 DIAGNOSIS — S329XXA Fracture of unspecified parts of lumbosacral spine and pelvis, initial encounter for closed fracture: Secondary | ICD-10-CM | POA: Diagnosis not present

## 2022-07-06 DIAGNOSIS — R41841 Cognitive communication deficit: Secondary | ICD-10-CM | POA: Diagnosis present

## 2022-07-06 DIAGNOSIS — K4090 Unilateral inguinal hernia, without obstruction or gangrene, not specified as recurrent: Secondary | ICD-10-CM | POA: Diagnosis not present

## 2022-07-06 DIAGNOSIS — G20A1 Parkinson's disease without dyskinesia, without mention of fluctuations: Secondary | ICD-10-CM | POA: Diagnosis present

## 2022-07-06 LAB — URINALYSIS, ROUTINE W REFLEX MICROSCOPIC
Bilirubin Urine: NEGATIVE
Glucose, UA: NEGATIVE mg/dL
Hgb urine dipstick: NEGATIVE
Ketones, ur: NEGATIVE mg/dL
Leukocytes,Ua: NEGATIVE
Nitrite: NEGATIVE
Protein, ur: NEGATIVE mg/dL
Specific Gravity, Urine: 1.012 (ref 1.005–1.030)
pH: 6 (ref 5.0–8.0)

## 2022-07-06 LAB — URINE DRUG SCREEN, QUALITATIVE (ARMC ONLY)
Amphetamines, Ur Screen: NOT DETECTED
Barbiturates, Ur Screen: NOT DETECTED
Benzodiazepine, Ur Scrn: NOT DETECTED
Cannabinoid 50 Ng, Ur ~~LOC~~: NOT DETECTED
Cocaine Metabolite,Ur ~~LOC~~: NOT DETECTED
MDMA (Ecstasy)Ur Screen: NOT DETECTED
Methadone Scn, Ur: NOT DETECTED
Opiate, Ur Screen: NOT DETECTED
Phencyclidine (PCP) Ur S: NOT DETECTED
Tricyclic, Ur Screen: NOT DETECTED

## 2022-07-06 LAB — CBC WITH DIFFERENTIAL/PLATELET
Abs Immature Granulocytes: 0.02 10*3/uL (ref 0.00–0.07)
Basophils Absolute: 0.1 10*3/uL (ref 0.0–0.1)
Basophils Relative: 1 %
Eosinophils Absolute: 0.1 10*3/uL (ref 0.0–0.5)
Eosinophils Relative: 2 %
HCT: 44.1 % (ref 39.0–52.0)
Hemoglobin: 14.6 g/dL (ref 13.0–17.0)
Immature Granulocytes: 0 %
Lymphocytes Relative: 28 %
Lymphs Abs: 2.2 10*3/uL (ref 0.7–4.0)
MCH: 30 pg (ref 26.0–34.0)
MCHC: 33.1 g/dL (ref 30.0–36.0)
MCV: 90.6 fL (ref 80.0–100.0)
Monocytes Absolute: 0.6 10*3/uL (ref 0.1–1.0)
Monocytes Relative: 8 %
Neutro Abs: 5 10*3/uL (ref 1.7–7.7)
Neutrophils Relative %: 61 %
Platelets: 172 10*3/uL (ref 150–400)
RBC: 4.87 MIL/uL (ref 4.22–5.81)
RDW: 13.1 % (ref 11.5–15.5)
WBC: 8.1 10*3/uL (ref 4.0–10.5)
nRBC: 0 % (ref 0.0–0.2)

## 2022-07-06 LAB — COMPREHENSIVE METABOLIC PANEL
ALT: 5 U/L (ref 0–44)
AST: 23 U/L (ref 15–41)
Albumin: 3.7 g/dL (ref 3.5–5.0)
Alkaline Phosphatase: 43 U/L (ref 38–126)
Anion gap: 10 (ref 5–15)
BUN: 19 mg/dL (ref 8–23)
CO2: 25 mmol/L (ref 22–32)
Calcium: 9.9 mg/dL (ref 8.9–10.3)
Chloride: 106 mmol/L (ref 98–111)
Creatinine, Ser: 0.91 mg/dL (ref 0.61–1.24)
GFR, Estimated: >60 mL/min (ref >60)
Glucose, Bld: 113 mg/dL — ABNORMAL HIGH (ref 70–99)
Potassium: 3.6 mmol/L (ref 3.5–5.1)
Sodium: 141 mmol/L (ref 135–145)
Total Bilirubin: 1.1 mg/dL (ref 0.3–1.2)
Total Protein: 6.5 g/dL (ref 6.5–8.1)

## 2022-07-06 LAB — LIPID PANEL
Cholesterol: 184 mg/dL (ref 0–200)
HDL: 36 mg/dL — ABNORMAL LOW (ref >40)
LDL Cholesterol: 126 mg/dL — ABNORMAL HIGH (ref 0–99)
Total CHOL/HDL Ratio: 5.1 RATIO
Triglycerides: 112 mg/dL (ref <150)
VLDL: 22 mg/dL (ref 0–40)

## 2022-07-06 LAB — ACETAMINOPHEN LEVEL: Acetaminophen (Tylenol), Serum: <10 ug/mL — ABNORMAL LOW (ref 10–30)

## 2022-07-06 LAB — RESP PANEL BY RT-PCR (RSV, FLU A&B, COVID)  RVPGX2
Influenza A by PCR: NEGATIVE
Influenza B by PCR: NEGATIVE
Resp Syncytial Virus by PCR: NEGATIVE
SARS Coronavirus 2 by RT PCR: NEGATIVE

## 2022-07-06 LAB — SALICYLATE LEVEL: Salicylate Lvl: <7 mg/dL — ABNORMAL LOW (ref 7.0–30.0)

## 2022-07-06 LAB — D-DIMER, QUANTITATIVE: D-Dimer, Quant: 1.19 ug/mL-FEU — ABNORMAL HIGH (ref 0.00–0.50)

## 2022-07-06 LAB — ETHANOL: Alcohol, Ethyl (B): <10 mg/dL (ref <10)

## 2022-07-06 MED ORDER — DROPERIDOL 2.5 MG/ML IJ SOLN
2.5000 mg | Freq: Once | INTRAMUSCULAR | Status: AC
  Administered 2022-07-06: 2.5 mg via INTRAVENOUS
  Filled 2022-07-06: qty 2

## 2022-07-06 MED ORDER — SODIUM CHLORIDE 0.9 % IV BOLUS
1000.0000 mL | Freq: Once | INTRAVENOUS | Status: AC
  Administered 2022-07-06: 1000 mL via INTRAVENOUS

## 2022-07-06 MED ORDER — LATANOPROST 0.005 % OP SOLN
1.0000 [drp] | Freq: Every day | OPHTHALMIC | Status: DC
  Administered 2022-07-06 – 2022-07-13 (×8): 1 [drp] via OPHTHALMIC
  Filled 2022-07-06: qty 2.5

## 2022-07-06 MED ORDER — ASPIRIN 81 MG PO TBEC
81.0000 mg | DELAYED_RELEASE_TABLET | Freq: Every day | ORAL | Status: DC
  Administered 2022-07-07 – 2022-07-08 (×2): 81 mg via ORAL
  Filled 2022-07-06 (×2): qty 1

## 2022-07-06 MED ORDER — HYDRALAZINE HCL 20 MG/ML IJ SOLN
10.0000 mg | INTRAMUSCULAR | Status: DC | PRN
  Administered 2022-07-06 – 2022-07-10 (×8): 10 mg via INTRAVENOUS
  Filled 2022-07-06 (×8): qty 1

## 2022-07-06 MED ORDER — CARBIDOPA-LEVODOPA 25-100 MG PO TABS
2.0000 | ORAL_TABLET | Freq: Four times a day (QID) | ORAL | Status: DC
  Administered 2022-07-06 – 2022-07-14 (×27): 2 via ORAL
  Filled 2022-07-06 (×27): qty 2

## 2022-07-06 MED ORDER — STROKE: EARLY STAGES OF RECOVERY BOOK: Freq: Once | Status: AC

## 2022-07-06 NOTE — ED Notes (Signed)
Dr. Charna Archer aware of pts blood pressure and wants to hold off on medications at this time.

## 2022-07-06 NOTE — ED Notes (Signed)
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 MD aware and came to bedside.

## 2022-07-06 NOTE — ED Notes (Signed)
Dr. Posey Pronto notified of pts elevated d dimer

## 2022-07-06 NOTE — ED Notes (Signed)
Assisting ED staff present to hold pts extremities while he is being medicated. Pt trying to kick and grab at this time. He is very confused at this time, hx of dementia but pt states he isn't normally like this.

## 2022-07-06 NOTE — H&P (Signed)
History and Physical    Chief Complaint: Falls    HISTORY OF PRESENT ILLNESS: Joshua Everett is an 88 y.o. male coming to ed today for multiple falls. More so over past 3 days, Per wife he fell 3 times yesterday and today again, put him in wheelchair and pt is not able to stand up this is new since last 3 days.  Pt is alert, cooperative. She also wants him to be Placed. She can no  longer take care of him.   Pt has  Past Medical History:  Diagnosis Date   Actinic keratosis    Basal cell carcinoma 09/03/2014   right of midline forehead   Basal cell carcinoma 03/25/2019   right lower nasal dorsum, [facility]   Basal cell carcinoma 03/30/2020   Mid nasal tip, [facility]   Basal cell carcinoma (BCC) 01/28/2015   right proximal nasal alar rim   BPH without obstruction/lower urinary tract symptoms 09/08/2014   Chronic constipation 09/08/2014   GERD (gastroesophageal reflux disease)    Glaucoma    History of chicken pox    Hyperlipidemia    Obesity, Class I, BMI 30-34.9 09/08/2014   Prediabetes 07/31/2015   Rosacea 07/06/2015   Squamous cell carcinoma of skin 01/30/2007   mid back, in situ   Squamous cell carcinoma of skin 03/12/2018   right forearm, in situ   Squamous cell carcinoma of skin 04/22/2018   left pretibial   Squamous cell carcinoma of skin 09/24/2018   right hand dorsum, in situ   Urine incontinence      Review of Systems  Unable to perform ROS: Dementia  Neurological:  Positive for weakness. Negative for seizures and syncope.       Falls.   Allergies  Allergen Reactions   Solifenacin Other (See Comments)    Hallucinations (per uro)   Past Surgical History:  Procedure Laterality Date   COLONOSCOPY  03/2009   1 TA, diverticulosis, rpt 5 yrs (Dr [facility] in [facility])   ESOPHAGOGASTRODUODENOSCOPY  03/2009   small HH, gastric polyps biopsied, dilation for dysphagia ([facility] [place] [facility])   HEMORRHOID SURGERY  1976       MEDICATIONS: Current Outpatient  Medications  Medication Instructions   aspirin EC 81 mg, Oral, Daily, Swallow whole.   bimatoprost (LUMIGAN) 0.01 % SOLN 1 drop, Both Eyes, Daily at bedtime   carbidopa-levodopa (SINEMET IR) 25-100 MG tablet 2 tablets, Oral, 4 times daily   cholecalciferol (VITAMIN D) 1,000 Units, Oral, Daily   Cyanocobalamin (VITAMIN B 12 PO) Oral, Daily   cycloSPORINE (RESTASIS) 0.05 % ophthalmic emulsion 1 drop, 2 times daily   Fiber Complete TABS 2 tablets, Oral, Daily   gabapentin (NEURONTIN) 100 mg, Oral, Daily at bedtime   lisinopril (ZESTRIL) 20 mg, Oral, Daily   Multiple Vitamins-Minerals (MULTIVITAMIN ADULT PO) 1 tablet, Daily   polyethylene glycol powder (GLYCOLAX/MIRALAX) 17 g, Oral, Daily PRN   sennosides-docusate sodium (SENOKOT-S) 8.6-50 MG tablet 1 tablet, Oral, Daily   timolol (TIMOPTIC) 0.5 % ophthalmic solution 1 drop, Both Eyes, Daily    ED Course: Pt in Ed is alert and cooperative.  Vitals:   07/06/22 1933 07/06/22 2100 07/06/22 2110 07/06/22 2302  BP: (!) 221/81 (!) 195/78  (!) 180/79  Pulse:   (!) 51 (!) 52  Resp: '15  16 18  '$ Temp:   98.6 F (37 C) 98.1 F (36.7 C)  TempSrc:   Oral   SpO2: 100%   99%  Weight:  Height:        No intake/output data recorded. SpO2: 99 % Blood work in ed shows: Results for orders placed or performed during the hospital encounter of 07/06/22 (from the past 24 hour(s))  Comprehensive metabolic panel     Status: Abnormal   Collection Time: 07/06/22  1:50 PM  Result Value Ref Range   Sodium 141 135 - 145 mmol/L   Potassium 3.6 3.5 - 5.1 mmol/L   Chloride 106 98 - 111 mmol/L   CO2 25 22 - 32 mmol/L   Glucose, Bld 113 (H) 70 - 99 mg/dL   BUN 19 8 - 23 mg/dL   Creatinine, Ser 0.91 0.61 - 1.24 mg/dL   Calcium 9.9 8.9 - 10.3 mg/dL   Total Protein 6.5 6.5 - 8.1 g/dL   Albumin 3.7 3.5 - 5.0 g/dL   AST 23 15 - 41 U/L   ALT 5 0 - 44 U/L   Alkaline Phosphatase 43 38 - 126 U/L   Total Bilirubin 1.1 0.3 - 1.2 mg/dL   GFR, Estimated >60 >60  mL/min   Anion gap 10 5 - 15  CBC with Differential     Status: None   Collection Time: 07/06/22  1:50 PM  Result Value Ref Range   WBC 8.1 4.0 - 10.5 K/uL   RBC 4.87 4.22 - 5.81 MIL/uL   Hemoglobin 14.6 13.0 - 17.0 g/dL   HCT 44.1 39.0 - 52.0 %   MCV 90.6 80.0 - 100.0 fL   MCH 30.0 26.0 - 34.0 pg   MCHC 33.1 30.0 - 36.0 g/dL   RDW 13.1 11.5 - 15.5 %   Platelets 172 150 - 400 K/uL   nRBC 0.0 0.0 - 0.2 %   Neutrophils Relative % 61 %   Neutro Abs 5.0 1.7 - 7.7 K/uL   Lymphocytes Relative 28 %   Lymphs Abs 2.2 0.7 - 4.0 K/uL   Monocytes Relative 8 %   Monocytes Absolute 0.6 0.1 - 1.0 K/uL   Eosinophils Relative 2 %   Eosinophils Absolute 0.1 0.0 - 0.5 K/uL   Basophils Relative 1 %   Basophils Absolute 0.1 0.0 - 0.1 K/uL   Immature Granulocytes 0 %   Abs Immature Granulocytes 0.02 0.00 - 0.07 K/uL  Lipid panel     Status: Abnormal   Collection Time: 07/06/22  2:15 PM  Result Value Ref Range   Cholesterol 184 0 - 200 mg/dL   Triglycerides 112 <150 mg/dL   HDL 36 (L) >40 mg/dL   Total CHOL/HDL Ratio 5.1 RATIO   VLDL 22 0 - 40 mg/dL   LDL Cholesterol 126 (H) 0 - 99 mg/dL  Resp panel by RT-PCR (RSV, Flu A&B, Covid) Anterior Nasal Swab     Status: None   Collection Time: 07/06/22  2:38 PM   Specimen: Anterior Nasal Swab  Result Value Ref Range   SARS Coronavirus 2 by RT PCR NEGATIVE NEGATIVE   Influenza A by PCR NEGATIVE NEGATIVE   Influenza B by PCR NEGATIVE NEGATIVE   Resp Syncytial Virus by PCR NEGATIVE NEGATIVE  Acetaminophen level     Status: Abnormal   Collection Time: 07/06/22  2:55 PM  Result Value Ref Range   Acetaminophen (Tylenol), Serum <10 (L) 10 - 30 ug/mL  Salicylate level     Status: Abnormal   Collection Time: 07/06/22  2:55 PM  Result Value Ref Range   Salicylate Lvl <2.4 (L) 7.0 - 30.0 mg/dL  Ethanol  Status: None   Collection Time: 07/06/22  2:55 PM  Result Value Ref Range   Alcohol, Ethyl (B) <10 <10 mg/dL  Urinalysis, Routine w reflex  microscopic -Urine, Clean Catch     Status: Abnormal   Collection Time: 07/06/22  5:21 PM  Result Value Ref Range   Color, Urine YELLOW (A) YELLOW   APPearance CLEAR (A) CLEAR   Specific Gravity, Urine 1.012 1.005 - 1.030   pH 6.0 5.0 - 8.0   Glucose, UA NEGATIVE NEGATIVE mg/dL   Hgb urine dipstick NEGATIVE NEGATIVE   Bilirubin Urine NEGATIVE NEGATIVE   Ketones, ur NEGATIVE NEGATIVE mg/dL   Protein, ur NEGATIVE NEGATIVE mg/dL   Nitrite NEGATIVE NEGATIVE   Leukocytes,Ua NEGATIVE NEGATIVE  Urine Drug Screen, Qualitative     Status: None   Collection Time: 07/06/22  5:21 PM  Result Value Ref Range   Tricyclic, Ur Screen NONE DETECTED NONE DETECTED   Amphetamines, Ur Screen NONE DETECTED NONE DETECTED   MDMA (Ecstasy)Ur Screen NONE DETECTED NONE DETECTED   Cocaine Metabolite,Ur [place] NONE DETECTED NONE DETECTED   Opiate, Ur Screen NONE DETECTED NONE DETECTED   Phencyclidine (PCP) Ur S NONE DETECTED NONE DETECTED   Cannabinoid 50 Ng, Ur [place] NONE DETECTED NONE DETECTED   Barbiturates, Ur Screen NONE DETECTED NONE DETECTED   Benzodiazepine, Ur Scrn NONE DETECTED NONE DETECTED   Methadone Scn, Ur NONE DETECTED NONE DETECTED  D-dimer, quantitative     Status: Abnormal   Collection Time: 07/06/22  7:30 PM  Result Value Ref Range   D-Dimer, Quant 1.19 (H) 0.00 - 0.50 ug/mL-FEU    Unresulted Labs (From admission, onward)    None      Pt has received : Orders Placed This Encounter  Procedures   Resp panel by RT-PCR (RSV, Flu A&B, Covid) Anterior Nasal Swab    Standing Status:   Standing    Number of Occurrences:   1   CT Head Wo Contrast    Standing Status:   Standing    Number of Occurrences:   1   CT Cervical Spine Wo Contrast    Standing Status:   Standing    Number of Occurrences:   1   DG Hip Unilat W or Wo Pelvis 2-3 Views Right    Standing Status:   Standing    Number of Occurrences:   1    Order Specific Question:   Reason for Exam (SYMPTOM  OR DIAGNOSIS REQUIRED)     Answer:   right leg pain, fall   MR BRAIN WO CONTRAST    Standing Status:   Standing    Number of Occurrences:   1    Order Specific Question:   What is the patient's sedation requirement?    Answer:   No Sedation    Order Specific Question:   Does the patient have a pacemaker or implanted devices?    Answer:   No   CT Angio Chest Pulmonary Embolism (PE) W or WO Contrast    Standing Status:   Standing    Number of Occurrences:   1    Order Specific Question:   Does the patient have a contrast media/X-ray dye allergy?    Answer:   No    Order Specific Question:   If indicated for the ordered procedure, I authorize the administration of contrast media per Radiology protocol    Answer:   Yes   Comprehensive metabolic panel    Standing Status:   Standing  Number of Occurrences:   1   CBC with Differential    Standing Status:   Standing    Number of Occurrences:   1   Urinalysis, Routine w reflex microscopic -Urine, Clean Catch    Standing Status:   Standing    Number of Occurrences:   1    Order Specific Question:   Specimen Source    Answer:   Urine, Clean Catch [[id number]]   Urine Drug Screen, Qualitative    Standing Status:   Standing    Number of Occurrences:   1   Acetaminophen level    Standing Status:   Standing    Number of Occurrences:   1   Salicylate level    Standing Status:   Standing    Number of Occurrences:   1   Ethanol    Standing Status:   Standing    Number of Occurrences:   1   D-dimer, quantitative    Standing Status:   Standing    Number of Occurrences:   1   Lipid panel    Fasting    Standing Status:   Standing    Number of Occurrences:   1   Diet NPO time specified    Standing Status:   Standing    Number of Occurrences:   1   NIHSS score documentation NIHSS score range: 0-42    NIHSS score range: 0-42    Standing Status:   Standing    Number of Occurrences:   1   Vital signs    Standing Status:   Standing    Number of Occurrences:   1   Notify  physician (specify)    Standing Status:   Standing    Number of Occurrences:   20    Order Specific Question:   Notify Physician    Answer:   change in neurologic status based on Neurologic Worsening protocol    Order Specific Question:   Notify Physician    Answer:   new onset dysrhythmia    Order Specific Question:   Notify Physician    Answer:   Systolic BP > 810 (not controlled by PRN continuous infusion medications) or less than 100    Order Specific Question:   Notify Physician    Answer:   Pox < 88% despite the use of supplemental O2 via nasal cannula    Order Specific Question:   Notify Physician    Answer:   Heart Rate > 120 or less than 50    Order Specific Question:   Notify Physician    Answer:   Respiratory Rate > 30    Order Specific Question:   Notify Physician    Answer:   Blood Glucose > '180mg'$ /dl or less than '60mg'$ /dl    Order Specific Question:   Notify Physician    Answer:   Temperature goal of 37 C (98.6 F). Acceptable range: 36.5 C - 37.5 C (97.7 F - 99.5 F)    Order Specific Question:   Notify Physician    Answer:   Urine output > 300 ml in 1 hour or less than 30 ml in 4 hours (unless anuric)   Swallow screen - If patient does NOT pass this screen, place order for SLP eval and treat (SLP2) - swallowing evaluation (BSE, MBS and/or diet order as indicated)    - If patient does NOT pass this screen, place order for SLP eval and treat (SLP2) - swallowing evaluation (BSE, MBS  and/or diet order as indicated)    Standing Status:   Standing    Number of Occurrences:   1   NIH stroke scale    On arrival to unit, then q 4h    Standing Status:   Standing    Number of Occurrences:   1   Intake and output    Avoid use of indwelling catheter    Standing Status:   Standing    Number of Occurrences:   1   Apply Stroke Care Plan: Ischemic Stroke, TIA    Standing Status:   Standing    Number of Occurrences:   1   Discuss with patient and document patient's goals for stroke risk  factor reduction    Standing Status:   Standing    Number of Occurrences:   1   Initiate Oral Care Protocol    Standing Status:   Standing    Number of Occurrences:   1   Initiate Carrier Fluid Protocol    Standing Status:   Standing    Number of Occurrences:   1   Provide stroke education material to patient and family    Standing Status:   Standing    Number of Occurrences:   1   Nurse to provide smoking / tobacco cessation education    Standing Status:   Standing    Number of Occurrences:   1   OOB with assistance    Standing Status:   Standing    Number of Occurrences:   [id number]   Cardiac Monitoring - Continuous Indefinite    Standing Status:   Standing    Number of Occurrences:   1    Order Specific Question:   Indications for use:    Answer:   Other    Order Specific Question:   Other indications for use:    Answer:   bradycardia   Consult to hospitalist    Standing Status:   Standing    Number of Occurrences:   1    Order Specific Question:   Place call to:    Answer:   [phone]    Order Specific Question:   Reason for Consult    Answer:   Admit   Consult to Transition of Care    Standing Status:   Standing    Number of Occurrences:   1    Order Specific Question:   Reason for Consult:    Answer:   SNF placement   OT eval and treat    Standing Status:   Standing    Number of Occurrences:   1   PT eval and treat    Standing Status:   Standing    Number of Occurrences:   1   Oxygen therapy Mode or (Route): Nasal cannula; Liters Per Minute: 2; Keep 02 saturation: greater than 94 %    Standing Status:   Standing    Number of Occurrences:   1    Order Specific Question:   Mode or (Route)    Answer:   Nasal cannula    Order Specific Question:   Liters Per Minute    Answer:   2    Order Specific Question:   Keep 02 saturation    Answer:   greater than 94 %   SLP eval and treat Reason for evaluation: Cognitive/Language evaluation    Standing Status:   Standing     Number of Occurrences:   1    Order  Specific Question:   Reason for evaluation    Answer:   Cognitive/Language evaluation   EKG 12-Lead    Standing Status:   Standing    Number of Occurrences:   1   ECHOCARDIOGRAM COMPLETE    Standing Status:   Standing    Number of Occurrences:   1    Order Specific Question:   Perflutren DEFINITY (image enhancing agent) should be administered unless hypersensitivity or allergy exist    Answer:   Administer Perflutren    Order Specific Question:   Reason for exam-Echo    Answer:   Stroke  I63.9   EEG adult    Standing Status:   Standing    Number of Occurrences:   1    Order Specific Question:   Reason for exam    Answer:   Seizure    Order Specific Question:   Reason for exam    Answer:   Altered mental status   Saline lock IV    Standing Status:   Standing    Number of Occurrences:   1   Admit to Inpatient (patient's expected length of stay will be greater than 2 midnights or inpatient only procedure)    Standing Status:   Standing    Number of Occurrences:   1    Order Specific Question:   Hospital Area    Answer:   [place] [100120]    Order Specific Question:   Level of Care    Answer:   Telemetry Medical [104]    Order Specific Question:   Covid Evaluation    Answer:   Asymptomatic - no recent exposure (last 10 days) testing not required    Order Specific Question:   Diagnosis    Answer:   Fall [720947]    Order Specific Question:   Admitting Physician    Answer:   Cherylann Ratel    Order Specific Question:   Attending Physician    Answer:   Cherylann Ratel    Order Specific Question:   Certification:    Answer:   I certify this patient will need inpatient services for at least 2 midnights    Order Specific Question:   Estimated Length of Stay    Answer:   2   Fall precautions    Standing Status:   Standing    Number of Occurrences:   1   Aspiration precautions    Standing Status:   Standing     Number of Occurrences:   1   Seizure precautions    Standing Status:   Standing    Number of Occurrences:   1   Fall precautions    Standing Status:   Standing    Number of Occurrences:   1    Meds ordered this encounter  Medications   sodium chloride 0.9 % bolus 1,000 mL   droperidol (INAPSINE) 2.5 MG/ML injection 2.5 mg   droperidol (INAPSINE) 2.5 MG/ML injection 2.5 mg   latanoprost (XALATAN) 0.005 % ophthalmic solution 1 drop   aspirin EC tablet 81 mg    Swallow whole.     carbidopa-levodopa (SINEMET IR) 25-100 MG per tablet immediate release 2 tablet    stroke: early stages of recovery book   hydrALAZINE (APRESOLINE) injection 10 mg   hydrochlorothiazide (HYDRODIURIL) tablet 6.25 mg   lisinopril (ZESTRIL) tablet 10 mg     Admission Imaging : DG Hip Unilat W or Wo Pelvis 2-3 Views  Right  Result Date: 07/06/2022 CLINICAL DATA:  right leg pain, fall EXAM: DG HIP (WITH OR WITHOUT PELVIS) 2-3V RIGHT COMPARISON:  None Available. FINDINGS: There is no evidence of acute right hip fracture. There is a possible fracture of the right sacral ilia. 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 Degenerative changes of the right SI joint and the pubic symphysis. IMPRESSION: Possible fracture of the right sacral ala. Consider CT of the pelvis. No evidence of acute right hip fracture. Electronically Signed   By: Maurine Simmering M.D.   On: 07/06/2022 18:02   CT Head Wo Contrast  Result Date: 07/06/2022 CLINICAL DATA:  Head trauma, minor. Four falls since yesterday. Neck trauma. EXAM: CT HEAD WITHOUT CONTRAST CT CERVICAL SPINE WITHOUT CONTRAST TECHNIQUE: Multidetector CT imaging of the head and cervical spine was performed following the standard protocol without intravenous contrast. Multiplanar CT image reconstructions of the cervical spine were also generated. RADIATION DOSE REDUCTION: This exam was performed according to the departmental dose-optimization program which includes automated  exposure control, adjustment of the mA and/or kV according to patient size and/or use of iterative reconstruction technique. COMPARISON:  CT head and cervical spine 05/03/2022. FINDINGS: CT HEAD FINDINGS Brain: No acute hemorrhage. Unchanged chronic small-vessel disease. 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 Old lacunar infarct in the right cerebellar hemisphere. Cortical gray-white differentiation is otherwise preserved. Prominence of the ventricles and sulci within normal limits for age. No extra-axial collection. Basilar cisterns are patent. Vascular: No hyperdense vessel or unexpected calcification. Skull: No calvarial fracture or suspicious bone lesion. Skull base is unremarkable. Sinuses/Orbits: Paranasal sinuses, mastoid air cells, and middle ear cavities are well aerated. Orbits are unremarkable. Other: No scalp hematoma. CT CERVICAL SPINE FINDINGS Alignment: 2 mm anterolisthesis of C4 on C5. Degenerative reversal of the normal cervical lordosis. Skull base and vertebrae: No acute fracture. Normal craniocervical junction. No suspicious bone lesions. Soft tissues and spinal canal: No prevertebral fluid or swelling. No visible canal hematoma. Disc levels:  No high-grade spinal canal stenosis. Upper chest: Unremarkable. Other: None. IMPRESSION: 1. No acute intracranial abnormality. 2. No acute cervical spine fracture or traumatic malalignment. Electronically Signed   By: Emmit Alexanders M.D.   On: 07/06/2022 15:25   CT Cervical Spine Wo Contrast  Result Date: 07/06/2022 CLINICAL DATA:  Head trauma, minor. Four falls since yesterday. Neck trauma. EXAM: CT HEAD WITHOUT CONTRAST CT CERVICAL SPINE WITHOUT CONTRAST TECHNIQUE: Multidetector CT imaging of the head and cervical spine was performed following the standard protocol without intravenous contrast. Multiplanar CT image reconstructions of the cervical spine were also generated. RADIATION DOSE REDUCTION: This exam was performed according to  the departmental dose-optimization program which includes automated exposure control, adjustment of the mA and/or kV according to patient size and/or use of iterative reconstruction technique. COMPARISON:  CT head and cervical spine 05/03/2022. FINDINGS: CT HEAD FINDINGS Brain: No acute hemorrhage. Unchanged chronic small-vessel disease. 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 Old lacunar infarct in the right cerebellar hemisphere. Cortical gray-white differentiation is otherwise preserved. Prominence of the ventricles and sulci within normal limits for age. No extra-axial collection. Basilar cisterns are patent. Vascular: No hyperdense vessel or unexpected calcification. Skull: No calvarial fracture or suspicious bone lesion. Skull base is unremarkable. Sinuses/Orbits: Paranasal sinuses, mastoid air cells, and middle ear cavities are well aerated. Orbits are unremarkable. Other: No scalp hematoma. CT CERVICAL SPINE FINDINGS Alignment: 2 mm anterolisthesis of C4 on C5. Degenerative reversal of the normal cervical lordosis.  Skull base and vertebrae: No acute fracture. Normal craniocervical junction. No suspicious bone lesions. Soft tissues and spinal canal: No prevertebral fluid or swelling. No visible canal hematoma. Disc levels:  No high-grade spinal canal stenosis. Upper chest: Unremarkable. Other: None. IMPRESSION: 1. No acute intracranial abnormality. 2. No acute cervical spine fracture or traumatic malalignment. Electronically Signed   By: Emmit Alexanders M.D.   On: 07/06/2022 15:25      Physical Examination: Vitals:   07/06/22 1933 07/06/22 2100 07/06/22 2110 07/06/22 2302  BP: (!) 221/81 (!) 195/78  (!) 180/79  Pulse:   (!) 51 (!) 52  Temp:   98.6 F (37 C) 98.1 F (36.7 C)  Resp: '15  16 18  '$ Height:      Weight:      SpO2: 100%   99%  TempSrc:   Oral   BMI (Calculated):       Physical Exam Vitals reviewed.  Constitutional:      General: He is not in acute  distress. HENT:     Head: Normocephalic and atraumatic.     Right Ear: External ear normal.     Left Ear: External ear normal.     Nose: Nose normal.  Eyes:     Extraocular Movements: Extraocular movements intact.     Pupils: Pupils are equal, round, and reactive to light.  Cardiovascular:     Rate and Rhythm: Normal rate and regular rhythm.     Pulses: Normal pulses.     Heart sounds: Normal heart sounds.  Pulmonary:     Effort: Pulmonary effort is normal.     Breath sounds: Normal breath sounds.  Abdominal:     General: Bowel sounds are normal. There is no distension.     Palpations: Abdomen is soft. There is no mass.     Tenderness: There is no abdominal tenderness. There is no guarding.     Hernia: No hernia is present.  Musculoskeletal:     Right lower leg: No edema.     Left lower leg: No edema.  Skin:    General: Skin is warm.  Neurological:     General: No focal deficit present.     Mental Status: He is alert.  Psychiatric:        Attention and Perception: Attention normal.        Mood and Affect: Affect is flat.        Behavior: Behavior is slowed. Behavior is not combative.     Comments: Cooperative/ follow commands.  Speech is low and slowed.        Assessment and Plan: * Fall Attribute to weakness . We will get PT and evaluate.  Aspiration/ fall precaution.   Recurrent falls Unclear if there is a syncope or presyncopal component to this we will evaluate patient for VTE as his D-dimer is elevated. Will also monitor for orthostatic or syncopal changes or presentation.   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  I am confidant we can get a good regimen for his bp.  I have cut down lisinopril to 10 mg and added .   Parkinson disease Per neurology note patient's Parkinson is worsening and has slowing of movement and even swallowing issues. Will continue his  carbidopa levodopa regimen.  Essential hypertension Vitals:   07/06/22 1347 07/06/22 1535 07/06/22 1705 07/06/22 1933  BP: (!) 154/66 (!) 188/77 (!) 192/93 (!) 221/81  07/06/22 2100 07/06/22 2302  BP: (!) 195/78 (!) 180/79  We will continue home regimen with lisinopril at 10 mg  add low dose diuretic with hctz 6.25 mg for ISH> And as needed hydralazine    GERD (gastroesophageal reflux disease) IV PPI.   Glaucoma Patient continued on home regimen of latanoprost will resume other home meds as well.    DVT prophylaxis:  Heparin   Code Status:  Full code   Family Communication:  Joshua Everett, Joshua Everett (Spouse)  [phone]   Disposition Plan:  TBD.   Consults called:  None  Admission status: Observation.    Unit/ Expected LOS: Med tele.   Para Skeans MD [facility]  6 PM- 2 AM. Please contact me via secure Chat 6 PM-2 AM. [phone]( Pager ) To contact the [facility] Attending or Consulting provider [place] or covering provider during after hours [place], for this patient.   Check the care team in [facility] and look for a) attending/consulting TR[facility] provider listed and b) the [facility] team listed Log into [email or web address] and use [facility]'s universal password to access. If you do not have the password, please contact the hospital operator. Locate the [facility] provider you are looking for under [facility] and page to a number that you can be directly reached. If you still have difficulty reaching the provider, please page the [facility] (Director on Call) for the Hospitalists listed on amion for assistance. [email or web address] 07/07/2022, 2:36 AM

## 2022-07-06 NOTE — ED Provider Notes (Signed)
-----------------------------------------   3:01 PM on 07/06/2022 -----------------------------------------  Blood pressure (!) 154/66, pulse (!) 48, temperature 97.6 F (36.4 C), temperature source Oral, resp. rate 18, height '5\' 10"'$  (1.778 m), weight 95.4 kg, SpO2 98 %.  Assuming care from Dr. Joni Fears.  In short, Joshua Everett is a 88 y.o. male with a chief complaint of Fall .  Refer to the original H&P for additional details.  The current plan of care is to follow-up labs and imaging for multiple falls.  ----------------------------------------- 6:19 PM on 07/06/2022 ----------------------------------------- Labs are unremarkable with no significant anemia, leukocytosis, electrolyte abnormality, or AKI.  CT head unremarkable and right hip x-ray negative for acute process.  Urinalysis does not appear concerning for infection.  Patient does have elevated BP and given his acute change with multiple falls over the past 24 hours, he would benefit from admission for further stroke workup.  Case discussed with hospitalist for admission.    Blake Divine, MD 07/06/22 [phone]

## 2022-07-06 NOTE — ED Triage Notes (Signed)
Wife called EMS after pt had 4 falls since yesterday. Hx of Parkinsons and dementia. Per EMS, wife stated that the pt is at his mental baseline. BG 115. No thinners, no head injury or LOC.

## 2022-07-06 NOTE — ED Notes (Signed)
Blood drawn and sent to lab along with resp. Panel. Pt taken to CT afterwards.

## 2022-07-06 NOTE — ED Provider Notes (Signed)
[facility] Provider Note    Event Date/Time   First MD Initiated Contact with Patient 07/06/22 1430     (approximate)   History   Chief Complaint: Fall   HPI  Joshua Everett is a 88 y.o. male with a history of GERD, dementia, Parkinson's disease who was brought to the ED due to multiple falls over the past 24 hours.  He is also having generalized weakness, unable to hold his weight up on his legs.  He has had recent head injury to with a small abrasion on the left side of his head after falling.  Patient is not able to answer any questions or provide any history which is baseline mental status per spouse.     Physical Exam   Triage Vital Signs: ED Triage Vitals  Enc Vitals Group     BP 07/06/22 1347 (!) 154/66     Pulse Rate 07/06/22 1347 (!) 48     Resp 07/06/22 1347 18     Temp 07/06/22 1347 97.6 F (36.4 C)     Temp Source 07/06/22 1347 Oral     SpO2 07/06/22 1345 96 %     Weight 07/06/22 1348 210 lb 5.1 oz (95.4 kg)     Height 07/06/22 1348 '5\' 10"'$  (1.778 m)     Head Circumference --      Peak Flow --      Pain Score --      Pain Loc --      Pain Edu? --      Excl. in [place]? --     Most recent vital signs: Vitals:   07/06/22 1347 07/06/22 1535  BP: (!) 154/66 (!) 188/77  Pulse: (!) 48 (!) 47  Resp: 18 17  Temp: 97.6 F (36.4 C)   SpO2: 98%     General: Awake, no distress.  CV:  Good peripheral perfusion.  Bradycardia heart rate 50 Resp:  Normal effort.  Clear to auscultation bilaterally Abd:  No distention.  Soft nontender Other:  Joints and long bones all stable.  Normal range of motion.  Right lower extremity appears shortened and externally rotated though patient does not exhibit any hip tenderness.   ED Results / Procedures / Treatments   Labs (all labs ordered are listed, but only abnormal results are displayed) Labs Reviewed  COMPREHENSIVE METABOLIC PANEL - Abnormal; Notable for the following components:      Result  Value   Glucose, Bld 113 (*)    All other components within normal limits  ACETAMINOPHEN LEVEL - Abnormal; Notable for the following components:   Acetaminophen (Tylenol), Serum <10 (*)    All other components within normal limits  SALICYLATE LEVEL - Abnormal; Notable for the following components:   Salicylate Lvl <3.4 (*)    All other components within normal limits  RESP PANEL BY RT-PCR (RSV, FLU A&B, COVID)  [id number]  CBC WITH DIFFERENTIAL/PLATELET  ETHANOL  URINALYSIS, ROUTINE W REFLEX MICROSCOPIC  URINE DRUG SCREEN, QUALITATIVE ([facility] ONLY)     EKG Interpreted by me Sinus bradycardia rate of 47.  Normal axis, normal intervals.  Normal QRS ST segments and T waves.   RADIOLOGY CT head interpreted by me, appears unremarkable.  Radiology report reviewed.  CT cervical spine unremarkable   PROCEDURES:  Procedures   MEDICATIONS ORDERED IN ED: Medications  sodium chloride 0.9 % bolus 1,000 mL (1,000 mLs Intravenous New Bag/Given 07/06/22 1503)     IMPRESSION / MDM / ASSESSMENT AND  PLAN / ED COURSE  I reviewed the triage vital signs and the nursing notes.  DDx: AKI, electrolyte abnormality, anemia, viral illness, intoxication, UTI, intracranial hemorrhage, intracranial mass, C-spine fracture, hip fracture  Patient's presentation is most consistent with acute presentation with potential threat to life or bodily function.  Patient brought to the ED due to generalized weakness resulting in multiple falls, unsafe at home and not able to be managed by his spouse at home.  Vital signs unremarkable, he is not in distress.  CT imaging of the neck is negative.   Initial lab workup is unremarkable, awaiting urinalysis.  X-ray pelvis and right hip pending.       FINAL CLINICAL IMPRESSION(S) / ED DIAGNOSES   Final diagnoses:  Parkinson's disease with dyskinesia, unspecified whether manifestations fluctuate  Chronic dementia (Joshua Everett)     Rx / DC Orders   ED Discharge Orders      None        Note:  This document was prepared using Dragon voice recognition software and may include unintentional dictation errors.   Carrie Mew, MD 07/06/22 [phone]

## 2022-07-06 NOTE — ED Notes (Signed)
MD Notified of bp with prn meds given. No new orders. Will continue to monitor.

## 2022-07-07 ENCOUNTER — Inpatient Hospital Stay
Admit: 2022-07-07 | Discharge: 2022-07-07 | Disposition: A | Discharge status: Home / Self Care | Payer: Medicare Other | Attending: Internal Medicine | Admitting: Internal Medicine

## 2022-07-07 ENCOUNTER — Inpatient Hospital Stay: Payer: Medicare Other

## 2022-07-07 DIAGNOSIS — R55 Syncope and collapse: Secondary | ICD-10-CM

## 2022-07-07 DIAGNOSIS — I2693 Single subsegmental pulmonary embolism without acute cor pulmonale: Secondary | ICD-10-CM

## 2022-07-07 LAB — HEPARIN LEVEL (UNFRACTIONATED)
Heparin Unfractionated: 0.84 IU/mL — ABNORMAL HIGH (ref 0.30–0.70)
Heparin Unfractionated: 0.67 IU/mL (ref 0.30–0.70)

## 2022-07-07 MED ORDER — IOHEXOL 350 MG/ML SOLN
75.0000 mL | Freq: Once | INTRAVENOUS | Status: AC | PRN
  Administered 2022-07-07: 75 mL via INTRAVENOUS

## 2022-07-07 MED ORDER — HEPARIN SODIUM (PORCINE) 5000 UNIT/ML IJ SOLN
5000.0000 [IU] | Freq: Three times a day (TID) | INTRAMUSCULAR | Status: DC
  Administered 2022-07-07: 5000 [IU] via SUBCUTANEOUS
  Filled 2022-07-07: qty 1

## 2022-07-07 MED ORDER — LISINOPRIL 10 MG PO TABS
10.0000 mg | ORAL_TABLET | Freq: Every day | ORAL | Status: DC
  Administered 2022-07-07 – 2022-07-08 (×2): 10 mg via ORAL
  Filled 2022-07-07 (×2): qty 1

## 2022-07-07 MED ORDER — HEPARIN BOLUS VIA INFUSION
2000.0000 [IU] | Freq: Once | INTRAVENOUS | Status: AC
  Administered 2022-07-07: 2000 [IU] via INTRAVENOUS
  Filled 2022-07-07: qty 2000

## 2022-07-07 MED ORDER — HEPARIN (PORCINE) 25000 UT/250ML-% IV SOLN
1300.0000 [IU]/h | INTRAVENOUS | Status: DC
  Administered 2022-07-07: 1500 [IU]/h via INTRAVENOUS
  Administered 2022-07-07: 1300 [IU]/h via INTRAVENOUS
  Filled 2022-07-07 (×2): qty 250

## 2022-07-07 MED ORDER — HEPARIN SODIUM (PORCINE) 5000 UNIT/ML IJ SOLN: 5000.0000 [IU] | Freq: Three times a day (TID) | INTRAMUSCULAR | Status: DC

## 2022-07-07 MED ORDER — HYDROCHLOROTHIAZIDE 12.5 MG PO TABS
6.2500 mg | ORAL_TABLET | Freq: Every day | ORAL | Status: DC
  Administered 2022-07-08 – 2022-07-14 (×7): 6.25 mg via ORAL
  Filled 2022-07-07 (×7): qty 1

## 2022-07-07 NOTE — Progress Notes (Signed)
OT Cancellation Note  Patient Details Name: Joshua Everett MRN: [id number] DOB: November 18, 1934   Cancelled Treatment:    Reason Eval/Treat Not Completed: Medical issues which prohibited therapy. OT Order received and char reviewed. Per chart review, pt with new acute PE and now placed on bedrest for further assessment and CT of pelvis. OT will follow and evaluate pt when medically cleared to participate.   Darleen Crocker, MS, OTR/L , CBIS ascom [phone]  07/07/22, 12:57 PM

## 2022-07-07 NOTE — Progress Notes (Addendum)
SLP Cancellation Note  Patient Details Name: Joshua Everett MRN: [id number] DOB: 1934-06-28   Cancelled treatment:       Pt eating lunch. Wife requested he be allowed to eat lunch, since he had not eaten since Thursday morning. Will continue efforts.   Jhalen Eley B. Quentin Ore, [facility], [place] Speech Language Pathologist  Shonna Chock 07/07/2022, 1:41 PM

## 2022-07-07 NOTE — Assessment & Plan Note (Signed)
Attribute to weakness . We will get PT and evaluate.  Aspiration/ fall precaution.

## 2022-07-07 NOTE — Procedures (Signed)
Routine EEG Report  Joshua Everett is a 88 y.o. male with a history of syncope who is undergoing an EEG to evaluate for seizures.  Report: This EEG was acquired with electrodes placed according to the International 10-20 electrode system (including Fp1, Fp2, F3, F4, C3, C4, P3, P4, O1, O2, T3, T4, T5, T6, A1, A2, Fz, Cz, Pz). The following electrodes were missing or displaced: none.  The occipital dominant rhythm was 5-6 Hz. This activity is reactive to stimulation. Drowsiness was manifested by background fragmentation; deeper stages of sleep were identified by K complexes and sleep spindles. There was no focal slowing. There were no interictal epileptiform discharges. There were no electrographic seizures identified. Photic stimulation and hyperventilation were not performed.  Impression and clinical correlation: This EEG was obtained while awake and asleep and is abnormal due to moderate diffuse slowing indicative of global cerebral dysfunction. Epileptiform abnormalities were not seen during this recording.  [name], MD [facility] [phone]  If 7pm- 7am, please page neurology on call as listed in [place].

## 2022-07-07 NOTE — Assessment & Plan Note (Addendum)
Unclear if there is a syncope or presyncopal component to this we will evaluate patient for VTE as his D-dimer is elevated. Will also monitor for orthostatic or syncopal changes or presentation.

## 2022-07-07 NOTE — Progress Notes (Addendum)
       CROSS COVER NOTE  NAME: Joshua Everett MRN: [id number] DOB : 10-11-1934     HPI/Events of Note   Called from [facility] radiology with + RLL small subsegmental PE  Assessment and  Interventions   Assessment: Review of records  Patient with frequent falls at advanced age likely secondary to  weakness and orthostatic hypotension associated with parkinsons No current fractures or hemorrhage head neck or right leg/hip Plan: Initiated heparin drip for now until decision on long term anticoagulation can further be weighed and discussed - likely not a good candidate with risk of hemorrhage high and clot burden low       Kathlene Cote NP [facility]

## 2022-07-07 NOTE — Progress Notes (Signed)
Patient is alert, calm and cooperative while VS was taken. Rachael Fee notified on patient latest VS.  07/07/22 0414  Vitals  Temp 97.7 F (36.5 C)  BP (!) 222/71  MAP (mmHg) 110  BP Location Right Arm  BP Method Automatic  Patient Position (if appropriate) Lying  Pulse Rate (!) 43  Pulse Rate Source Monitor  Resp 18  MEWS COLOR  MEWS Score Color Yellow  Oxygen Therapy  SpO2 99 %  O2 Device Room Air  MEWS Score  MEWS Temp 0  MEWS Systolic 2  MEWS Pulse 1  MEWS RR 0  MEWS LOC 0  MEWS Score 3

## 2022-07-07 NOTE — Progress Notes (Signed)
*  PRELIMINARY RESULTS* Echocardiogram 2D Echocardiogram has been attempted at 1422 and 1515. Patient is off the floor having another test.  Joshua Everett 07/07/2022, 3:29 PM

## 2022-07-07 NOTE — Assessment & Plan Note (Addendum)
Vitals:   07/06/22 1347 07/06/22 1535 07/06/22 1705 07/06/22 1933  BP: (!) 154/66 (!) 188/77 (!) 192/93 (!) 221/81   07/06/22 2100 07/06/22 2302  BP: (!) 195/78 (!) 180/79  We will continue home regimen with lisinopril at 10 mg  add low dose diuretic with hctz 6.25 mg for ISH> And as needed hydralazine

## 2022-07-07 NOTE — Consult Note (Signed)
ANTICOAGULATION CONSULT NOTE - Initial Consult  Pharmacy Consult for heparin infusion Indication: pulmonary embolus  Allergies  Allergen Reactions   Solifenacin Other (See Comments)    Hallucinations (per uro)    Patient Measurements: Height: '5\' 10"'$  (177.8 cm) Weight: 95.4 kg (210 lb 5.1 oz) IBW/kg (Calculated) : 73 Heparin Dosing Weight: 92.5 kg  Vital Signs: Temp: 98.1 F (36.7 C) (02/01 2302) Temp Source: Oral (02/01 2110) BP: 180/79 (02/01 2302) Pulse Rate: 52 (02/01 2302)  Labs: Recent Labs    07/06/22 1350  HGB 14.6  HCT 44.1  PLT 172  CREATININE 0.91    Estimated Creatinine Clearance: 68.8 mL/min (by C-G formula based on SCr of 0.91 mg/dL).   Medical History: Past Medical History:  Diagnosis Date   Actinic keratosis    Basal cell carcinoma 09/03/2014   right of midline forehead   Basal cell carcinoma 03/25/2019   right lower nasal dorsum, EDC   Basal cell carcinoma 03/30/2020   Mid nasal tip, EDC   Basal cell carcinoma (BCC) 01/28/2015   right proximal nasal alar rim   BPH without obstruction/lower urinary tract symptoms 09/08/2014   Chronic constipation 09/08/2014   GERD (gastroesophageal reflux disease)    Glaucoma    History of chicken pox    Hyperlipidemia    Obesity, Class I, BMI 30-34.9 09/08/2014   Prediabetes 07/31/2015   Rosacea 07/06/2015   Squamous cell carcinoma of skin 01/30/2007   mid back, in situ   Squamous cell carcinoma of skin 03/12/2018   right forearm, in situ   Squamous cell carcinoma of skin 04/22/2018   left pretibial   Squamous cell carcinoma of skin 09/24/2018   right hand dorsum, in situ   Urine incontinence     Medications:  Heparin 5000 units SQ: last dose 2/2 0330  Assessment: 88 y.o. male coming to ED for multiple falls. More so over past 3 days. Patient with elevated d-dimer and CT positive for RLL pulmonary embolism. Pharmacy has been consulted to initiate and manage IV heparin infusion.  Goal of Therapy:   Heparin level 0.3-0.7 units/ml Monitor platelets by anticoagulation protocol: Yes   Plan:  Give reduced bolus of 2000 units x 1 given recent SQ heparin dose Start heparin infusion at 1500 units/hr Check anti-Xa level in 8 hours  Continue to monitor H&H and platelets  Dorothe Pea, PharmD, BCPS Clinical Pharmacist   07/07/2022,3:42 AM

## 2022-07-07 NOTE — Assessment & Plan Note (Signed)
IV PPI. 

## 2022-07-07 NOTE — Progress Notes (Signed)
Eeg done 

## 2022-07-07 NOTE — Evaluation (Signed)
Speech Language Pathology Evaluation Patient Details Name: Tally Mckinnon MRN: [id number] DOB: 1934/11/30 Today's Date: 07/07/2022 Time: 1336-1410 SLP Time Calculation (min) (ACUTE ONLY): 34 min  Problem List:  Patient Active Problem List   Diagnosis Date Noted   Fall 07/06/2022   Cerumen impaction 06/19/2022   Scalp laceration, subsequent encounter 05/11/2022   Recurrent falls 05/11/2022   AMS (altered mental status) 04/08/2022   Syncope 06/22/2020   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[place]) 06/22/2020   Pedal edema 08/29/2019   Medicare annual wellness visit, subsequent 01/13/2019   Parkinson disease 03/12/2018   Abnormal gait 01/04/2018   Overactive bladder 05/21/2017   Chronic pain of right knee 01/01/2017   Chest pain with moderate risk for cardiac etiology 08/17/2016   Advanced care planning/counseling discussion 01/23/2016   Prediabetes 07/31/2015   Rosacea 07/06/2015   Personal history of other malignant neoplasm of skin 11/19/2014   Abdominal wall hernia 09/08/2014   BPH without obstruction/lower urinary tract symptoms 09/08/2014   Chronic constipation 09/08/2014   Dyslipidemia 09/08/2014   Glaucoma 09/08/2014   GERD (gastroesophageal reflux disease) 09/08/2014   Essential hypertension 09/08/2014   Overweight (BMI 25.0-29.9) 09/08/2014   Nocturia 11/13/2012   Past Medical History:  Past Medical History:  Diagnosis Date   Actinic keratosis    Basal cell carcinoma 09/03/2014   right of midline forehead   Basal cell carcinoma 03/25/2019   right lower nasal dorsum, EDC   Basal cell carcinoma 03/30/2020   Mid nasal tip, EDC   Basal cell carcinoma (BCC) 01/28/2015   right proximal nasal alar rim   BPH without obstruction/lower urinary tract symptoms 09/08/2014   Chronic constipation 09/08/2014   GERD (gastroesophageal reflux disease)    Glaucoma    History of chicken pox    Hyperlipidemia    Obesity, Class I, BMI 30-34.9 09/08/2014   Prediabetes  07/31/2015   Rosacea 07/06/2015   Squamous cell carcinoma of skin 01/30/2007   mid back, in situ   Squamous cell carcinoma of skin 03/12/2018   right forearm, in situ   Squamous cell carcinoma of skin 04/22/2018   left pretibial   Squamous cell carcinoma of skin 09/24/2018   right hand dorsum, in situ   Urine incontinence    Past Surgical History:  Past Surgical History:  Procedure Laterality Date   COLONOSCOPY  03/2009   1 TA, diverticulosis, rpt 5 yrs (Dr [facility] in [facility])   ESOPHAGOGASTRODUODENOSCOPY  03/2009   small HH, gastric polyps biopsied, dilation for dysphagia ([place])   HEMORRHOID SURGERY  1976   HPI:      Assessment / Plan / Recommendation Clinical Impression  Pt was seen at bedside for cognitive linguistic assessment. Wife was at bedside. Pt was awake and alert, however, his responses were significantly delayed if they occurred at all. Speech was intelligible, and there was no apparent orofacial weakness or asymmetry. After attempting to assess orientation and immediate recall of 3 unrelated words, pt's wife indicated he had "checked out". No further responses were elicited. Transport and RN then arrived to take pt for testing. Pt's wife indicated his performance was baseline, and it had been a gradual decline over time. She does not want to move to a [place] as she feels this would be too much of a change for him. At this time she is thinking about having someone come to their home to assist during the day. Given baseline deficits in cognitive linguistics related to Parkinsons and  dementia without new finding on MRI, ST will sign off. No further acute ST needs at this time.    SLP Assessment  SLP Recommendation/Assessment: All further Speech Language Pathology needs can be addressed in the next venue of care if needs arise.  SLP Visit Diagnosis: Cognitive communication deficit (R41.841)    Recommendations for follow up therapy are one component of a  multi-disciplinary discharge planning process, led by the attending physician.  Recommendations may be updated based on patient status, additional functional criteria and insurance authorization.    Follow Up Recommendations  Follow physician's recommendations for discharge plan and follow up therapies    Assistance Recommended at Discharge  Frequent or constant Supervision/Assistance  Functional Status Assessment Patient has had a recent decline in their functional status and/or demonstrates limited ability to make significant improvements in function in a reasonable and predictable amount of time     SLP Evaluation Cognition  Overall Cognitive Status: History of cognitive impairments - at baseline Arousal/Alertness: Awake/alert Orientation Level: Disoriented to place;Disoriented to time;Disoriented to situation; able to tell me his last name only, not first name or DOB      Comprehension  Auditory Comprehension Overall Auditory Comprehension: Impaired at baseline    Expression Verbal Expression Overall Verbal Expression: Impaired at baseline   Oral / Motor  Oral Motor/Sensory Function Overall Oral Motor/Sensory Function: Within functional limits Motor Speech Overall Motor Speech: Appears within functional limits for tasks assessed           Biannca Scantlin B. Quentin Ore, [facility], [place] Speech Language Pathologist  Shonna Chock 07/07/2022, 2:30 PM

## 2022-07-07 NOTE — Assessment & Plan Note (Signed)
Per chart review pt was on hctz but had orthostatic hypotension and was stopped.  I am confidant we can get a good regimen for his bp.  I have cut down lisinopril to 10 mg and added .

## 2022-07-07 NOTE — Plan of Care (Signed)
  Problem: Ischemic Stroke/TIA Tissue Perfusion: Goal: Complications of ischemic stroke/TIA will be minimized Outcome: Progressing   Problem: Coping: Goal: Will verbalize positive feelings about self Outcome: Progressing   Problem: Coping: Goal: Will identify appropriate support needs Outcome: Progressing

## 2022-07-07 NOTE — Assessment & Plan Note (Signed)
Patient continued on home regimen of latanoprost will resume other home meds as well.

## 2022-07-07 NOTE — Progress Notes (Signed)
PROGRESS NOTE  Joshua Everett HRC:[id number] DOB: 06-28-1934 DOA: 07/06/2022 PCP: Ria Bush, MD  Hospital Course/Subjective: 88 yo male who lives independently with his wife and has a history of HTN, HLD, parkinsons and is being admitted for multiple falls at home. Seen this AM with wife at bedside, he is resting comfortably and denies any pain. He was started on Heparin gtt overnight for small nonocclusive PE seen on CTA chest.   Assessment/Plan: PE Small, subsegmental. No hypoxia or chest pain. Continue Heparin gtt for now, but wife agrees that risk of AC may outweight potential benefits. Will obtain BLE dopplers to rule out DVT, which may help guide the decision.  Suspected Sacral Ala Fracture Seen on XR at the time of admission Keep on bedrest for now and check CT pelvis  Fall Attribute to weakness . We will get PT and evaluate, pending pelvic CT Aspiration/ fall precaution.  Recurrent falls Will also monitor for orthostatic or syncopal changes or presentation.  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  Lisinopril was reduced and started on low dose HCTZ, will monitor closely.  Parkinson disease Per neurology note patient's Parkinson is worsening and has slowing of movement and even swallowing issues. Will continue his carbidopa levodopa regimen.  Essential hypertension Vitals:   07/06/22 1347 07/06/22 1535 07/06/22 1705 07/06/22 1933  BP: (!) 154/66 (!) 188/77 (!) 192/93 (!) 221/81   07/06/22 2100 07/06/22 2302  BP: (!) 195/78 (!) 180/79  We will continue home regimen with lisinopril at 10 mg  add low dose diuretic with hctz 6.25 mg. as needed hydralazine as well  GERD (gastroesophageal reflux disease) IV PPI.  Glaucoma Patient continued on home regimen of latanoprost   DVT Prophylaxis: Heparin drip  Code Status: FULL   Family Communication: Discussed  with patient and wife at the bedside this morning.   Disposition Plan: Likely SNF.   Consultants: None   Procedures: None   Antimicrobials: Anti-infectives (From admission, onward)    None       Objective: Vitals:   07/07/22 0546 07/07/22 0613 07/07/22 0831 07/07/22 0900  BP: (!) 189/67 (!) 177/89 (!) 160/100 (!) 156/84  Pulse: (!) 50 (!) 57 (!) 52   Resp: '18 18 18   '$ Temp: 97.7 F (36.5 C) 97.8 F (36.6 C) (!) 97.5 F (36.4 C)   TempSrc: Oral     SpO2: 93% 100% 100%   Weight:      Height:        Intake/Output Summary (Last 24 hours) at 07/07/2022 1114 Last data filed at 07/07/2022 0830 Gross per 24 hour  Intake 1036.47 ml  Output 1050 ml  Net -13.53 ml   Filed Weights   07/06/22 1348  Weight: 95.4 kg   Exam: General:  Alert, oriented, calm, in no acute distress Eyes: EOMI, clear sclerea Neck: supple, no masses, trachea mildline  Cardiovascular: RRR, no murmurs or rubs, no peripheral edema  Respiratory: clear to auscultation bilaterally, no wheezes, no crackles  Abdomen: soft, nontender, nondistended, normal bowel tones heard  Skin: dry, no rashes  Musculoskeletal: no joint effusions, normal range of motion  Psychiatric: appropriate affect, normal speech  Neurologic: extraocular muscles intact, clear speech, moving all extremities with intact sensorium   Data Reviewed: CBC: Recent Labs  Lab 07/06/22 1350  WBC 8.1  NEUTROABS 5.0  HGB 14.6  HCT 44.1  MCV 90.6  PLT 172   Basic  Metabolic Panel: Recent Labs  Lab 07/06/22 1350  NA 141  K 3.6  CL 106  CO2 25  GLUCOSE 113*  BUN 19  CREATININE 0.91  CALCIUM 9.9   GFR: Estimated Creatinine Clearance: 68.8 mL/min (by C-G formula based on SCr of 0.91 mg/dL). Liver Function Tests: Recent Labs  Lab 07/06/22 1350  AST 23  ALT 5  ALKPHOS 43  BILITOT 1.1  PROT 6.5  ALBUMIN 3.7   No results for input(s): "LIPASE", "AMYLASE" in the last 168 hours. No results for input(s): "AMMONIA" in the last  168 hours. Coagulation Profile: No results for input(s): "INR", "PROTIME" in the last 168 hours. Cardiac Enzymes: No results for input(s): "CKTOTAL", "CKMB", "CKMBINDEX", "TROPONINI" in the last 168 hours. BNP (last 3 results) No results for input(s): "PROBNP" in the last 8760 hours. HbA1C: No results for input(s): "HGBA1C" in the last 72 hours. CBG: No results for input(s): "GLUCAP" in the last 168 hours. Lipid Profile: Recent Labs    07/06/22 1415  CHOL 184  HDL 36*  LDLCALC 126*  TRIG 112  CHOLHDL 5.1   Thyroid Function Tests: No results for input(s): "TSH", "T4TOTAL", "FREET4", "T3FREE", "THYROIDAB" in the last 72 hours. Anemia Panel: No results for input(s): "VITAMINB12", "FOLATE", "FERRITIN", "TIBC", "IRON", "RETICCTPCT" in the last 72 hours. Urine analysis:    Component Value Date/Time   COLORURINE YELLOW (A) 07/06/2022 1721   APPEARANCEUR CLEAR (A) 07/06/2022 1721   LABSPEC 1.012 07/06/2022 1721   PHURINE 6.0 07/06/2022 1721   GLUCOSEU NEGATIVE 07/06/2022 1721   HGBUR NEGATIVE 07/06/2022 1721   BILIRUBINUR NEGATIVE 07/06/2022 1721   BILIRUBINUR negative 01/06/2020 1000   KETONESUR NEGATIVE 07/06/2022 1721   PROTEINUR NEGATIVE 07/06/2022 1721   UROBILINOGEN 1.0 01/06/2020 1000   NITRITE NEGATIVE 07/06/2022 1721   LEUKOCYTESUR NEGATIVE 07/06/2022 1721   Sepsis Labs: '@LABRCNTIP'$ (procalcitonin:4,lacticidven:4)  ) Recent Results (from the past 240 hour(s))  Resp panel by RT-PCR (RSV, Flu A&B, Covid) Anterior Nasal Swab     Status: None   Collection Time: 07/06/22  2:38 PM   Specimen: Anterior Nasal Swab  Result Value Ref Range Status   SARS Coronavirus 2 by RT PCR NEGATIVE NEGATIVE Final    Comment: (NOTE) SARS-CoV-2 target nucleic acids are NOT DETECTED.  The SARS-CoV-2 RNA is generally detectable in upper respiratory specimens during the acute phase of infection. The lowest concentration of SARS-CoV-2 viral copies this assay can detect is 138 copies/mL. A  negative result does not preclude SARS-Cov-2 infection and should not be used as the sole basis for treatment or other patient management decisions. A negative result may occur with  improper specimen collection/handling, submission of specimen other than nasopharyngeal swab, presence of viral mutation(s) within the areas targeted by this assay, and inadequate number of viral copies(<138 copies/mL). A negative result must be combined with clinical observations, patient history, and epidemiological information. The expected result is Negative.  Fact Sheet for Patients:  EntrepreneurPulse.com.au  Fact Sheet for Healthcare Providers:  IncredibleEmployment.be  This test is no t yet approved or cleared by the [place] FDA and  has been authorized for detection and/or diagnosis of SARS-CoV-2 by FDA under an Emergency Use Authorization (EUA). This EUA will remain  in effect (meaning this test can be used) for the duration of the COVID-19 declaration under Section 564(b)(1) of the Act, 21 U.S.C.section 360bbb-3(b)(1), unless the authorization is terminated  or revoked sooner.       Influenza A by PCR NEGATIVE NEGATIVE Final   Influenza  B by PCR NEGATIVE NEGATIVE Final    Comment: (NOTE) The Xpert Xpress SARS-CoV-2/FLU/RSV plus assay is intended as an aid in the diagnosis of influenza from Nasopharyngeal swab specimens and should not be used as a sole basis for treatment. Nasal washings and aspirates are unacceptable for Xpert Xpress SARS-CoV-2/FLU/RSV testing.  Fact Sheet for Patients: EntrepreneurPulse.com.au  Fact Sheet for Healthcare Providers: IncredibleEmployment.be  This test is not yet approved or cleared by the [place] FDA and has been authorized for detection and/or diagnosis of SARS-CoV-2 by FDA under an Emergency Use Authorization (EUA). This EUA will remain in effect (meaning this test can  be used) for the duration of the COVID-19 declaration under Section 564(b)(1) of the Act, 21 U.S.C. section 360bbb-3(b)(1), unless the authorization is terminated or revoked.     Resp Syncytial Virus by PCR NEGATIVE NEGATIVE Final    Comment: (NOTE) Fact Sheet for Patients: EntrepreneurPulse.com.au  Fact Sheet for Healthcare Providers: IncredibleEmployment.be  This test is not yet approved or cleared by the [place] FDA and has been authorized for detection and/or diagnosis of SARS-CoV-2 by FDA under an Emergency Use Authorization (EUA). This EUA will remain in effect (meaning this test can be used) for the duration of the COVID-19 declaration under Section 564(b)(1) of the Act, 21 U.S.C. section 360bbb-3(b)(1), unless the authorization is terminated or revoked.  Performed at [facility], [address] [place], [place] [address]      Studies: CT Angio Chest Pulmonary Embolism (PE) W or WO Contrast  Result Date: 07/07/2022 CLINICAL DATA:  Fall, elevated D-dimer, evaluate for PE EXAM: CT ANGIOGRAPHY CHEST WITH CONTRAST TECHNIQUE: Multidetector CT imaging of the chest was performed using the standard protocol during bolus administration of intravenous contrast. Multiplanar CT image reconstructions and MIPs were obtained to evaluate the vascular anatomy. RADIATION DOSE REDUCTION: This exam was performed according to the departmental dose-optimization program which includes automated exposure control, adjustment of the mA and/or kV according to patient size and/or use of iterative reconstruction technique. CONTRAST:  2m OMNIPAQUE IOHEXOL 350 MG/ML SOLN COMPARISON:  Chest radiograph dated 05/04/2022 FINDINGS: Cardiovascular: Satisfactory opacification of the bilateral pulmonary arteries to the segmental level. Evaluation is constrained due to motion degradation. Within that constraint, there is isolated segmental pulmonary embolism in the  right lower lobe pulmonary artery (series 5/image 164). Overall clot burden is very small. Although not tailored for evaluation of the thoracic aorta, there is no evidence thoracic aortic aneurysm or dissection. Mild atherosclerotic calcifications of the arch. Severe three-coronary atherosclerosis, lad predominant. Mediastinum/Nodes: 1.7 cm short axis right paratracheal node (series 5/image 78). Visualized thyroid is unremarkable. Lungs/Pleura: Evaluation lung parenchyma is constrained by respiratory motion. Within that constraint, there are no suspicious pulmonary nodules. No focal consolidation. No pleural effusion or pneumothorax. Upper Abdomen: Visualized upper abdomen is notable for cholelithiasis, vascular calcifications, and a central hepatic cyst. Musculoskeletal: Degenerative changes of the visualized thoracolumbar spine. Review of the MIP images confirms the above findings. IMPRESSION: Isolated segmental pulmonary embolism in the right lower lobe pulmonary artery. Overall clot burden is very small. 1.7 cm short axis right paratracheal node, nonspecific. Consider follow-up CT chest in 3 months. These results will be called to the ordering clinician or representative by the Radiologist Assistant, and communication documented in the PACS or [employer] Aortic Atherosclerosis (ICD10-I70.0). Electronically Signed   By: SJulian HyM.D.   On: 07/07/2022 03:26   MR BRAIN WO CONTRAST  Result Date: 07/06/2022 CLINICAL DATA:  Altered mental status EXAM:  MRI HEAD WITHOUT CONTRAST TECHNIQUE: Multiplanar, multiecho pulse sequences of the brain and surrounding structures were obtained without intravenous contrast. COMPARISON:  06/15/2020 FINDINGS: Brain: No acute infarct, mass effect or extra-axial collection. Fewer than 5 scattered microhemorrhages in a nonspecific pattern. There is multifocal hyperintense T2-weighted signal within the white matter. Generalized volume loss. Old right frontal infarct  bilateral old cerebellar small vessel infarcts. The midline structures are normal. Vascular: Normal flow voids. Skull and upper cervical spine: Normal marrow signal. Sinuses/Orbits: Negative. Other: None. IMPRESSION: 1. No acute intracranial abnormality. 2. 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 Electronically Signed   By: Ulyses Jarred M.D.   On: 07/06/2022 23:16   DG Hip Unilat W or Wo Pelvis 2-3 Views Right  Result Date: 07/06/2022 CLINICAL DATA:  right leg pain, fall EXAM: DG HIP (WITH OR WITHOUT PELVIS) 2-3V RIGHT COMPARISON:  None Available. FINDINGS: There is no evidence of acute right hip fracture. There is a possible fracture of the right sacral ilia. 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 Degenerative changes of the right SI joint and the pubic symphysis. IMPRESSION: Possible fracture of the right sacral ala. Consider CT of the pelvis. No evidence of acute right hip fracture. Electronically Signed   By: Maurine Simmering M.D.   On: 07/06/2022 18:02   CT Head Wo Contrast  Result Date: 07/06/2022 CLINICAL DATA:  Head trauma, minor. Four falls since yesterday. Neck trauma. EXAM: CT HEAD WITHOUT CONTRAST CT CERVICAL SPINE WITHOUT CONTRAST TECHNIQUE: Multidetector CT imaging of the head and cervical spine was performed following the standard protocol without intravenous contrast. Multiplanar CT image reconstructions of the cervical spine were also generated. RADIATION DOSE REDUCTION: This exam was performed according to the departmental dose-optimization program which includes automated exposure control, adjustment of the mA and/or kV according to patient size and/or use of iterative reconstruction technique. COMPARISON:  CT head and cervical spine 05/03/2022. FINDINGS: CT HEAD FINDINGS Brain: No acute hemorrhage. Unchanged chronic small-vessel disease. 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 Old lacunar infarct in the right cerebellar  hemisphere. Cortical gray-white differentiation is otherwise preserved. Prominence of the ventricles and sulci within normal limits for age. No extra-axial collection. Basilar cisterns are patent. Vascular: No hyperdense vessel or unexpected calcification. Skull: No calvarial fracture or suspicious bone lesion. Skull base is unremarkable. Sinuses/Orbits: Paranasal sinuses, mastoid air cells, and middle ear cavities are well aerated. Orbits are unremarkable. Other: No scalp hematoma. CT CERVICAL SPINE FINDINGS Alignment: 2 mm anterolisthesis of C4 on C5. Degenerative reversal of the normal cervical lordosis. Skull base and vertebrae: No acute fracture. Normal craniocervical junction. No suspicious bone lesions. Soft tissues and spinal canal: No prevertebral fluid or swelling. No visible canal hematoma. Disc levels:  No high-grade spinal canal stenosis. Upper chest: Unremarkable. Other: None. IMPRESSION: 1. No acute intracranial abnormality. 2. No acute cervical spine fracture or traumatic malalignment. Electronically Signed   By: Emmit Alexanders M.D.   On: 07/06/2022 15:25   CT Cervical Spine Wo Contrast  Result Date: 07/06/2022 CLINICAL DATA:  Head trauma, minor. Four falls since yesterday. Neck trauma. EXAM: CT HEAD WITHOUT CONTRAST CT CERVICAL SPINE WITHOUT CONTRAST TECHNIQUE: Multidetector CT imaging of the head and cervical spine was performed following the standard protocol without intravenous contrast. Multiplanar CT image reconstructions of the cervical spine were also generated. RADIATION DOSE REDUCTION: This exam was performed according to the departmental dose-optimization program which includes automated exposure control, adjustment of the mA and/or kV  according to patient size and/or use of iterative reconstruction technique. COMPARISON:  CT head and cervical spine 05/03/2022. FINDINGS: CT HEAD FINDINGS Brain: No acute hemorrhage. Unchanged chronic small-vessel disease. Old cortical infarct in the  right middle frontal gyrus. Old lacunar infarct in the right cerebellar hemisphere. Cortical gray-white differentiation is otherwise preserved. Prominence of the ventricles and sulci within normal limits for age. No extra-axial collection. Basilar cisterns are patent. Vascular: No hyperdense vessel or unexpected calcification. Skull: No calvarial fracture or suspicious bone lesion. Skull base is unremarkable. Sinuses/Orbits: Paranasal sinuses, mastoid air cells, and middle ear cavities are well aerated. Orbits are unremarkable. Other: No scalp hematoma. CT CERVICAL SPINE FINDINGS Alignment: 2 mm anterolisthesis of C4 on C5. Degenerative reversal of the normal cervical lordosis. Skull base and vertebrae: No acute fracture. Normal craniocervical junction. No suspicious bone lesions. Soft tissues and spinal canal: No prevertebral fluid or swelling. No visible canal hematoma. Disc levels:  No high-grade spinal canal stenosis. Upper chest: Unremarkable. Other: None. IMPRESSION: 1. No acute intracranial abnormality. 2. No acute cervical spine fracture or traumatic malalignment. Electronically Signed   By: Emmit Alexanders M.D.   On: 07/06/2022 15:25    Scheduled Meds:   stroke: early stages of recovery book   Does not apply Once   aspirin EC  81 mg Oral Daily   carbidopa-levodopa  2 tablet Oral QID   hydrochlorothiazide  6.25 mg Oral Daily   latanoprost  1 drop Both Eyes QHS   lisinopril  10 mg Oral Daily    Continuous Infusions:  heparin 1,500 Units/hr (07/07/22 0554)     LOS: 1 day   Time spent: 31 minutes  Darrold Bezek Neva Seat, MD [facility] Pager [phone]  If 7PM-7AM, please contact night-coverage [email or web address] Password [email or web address] 07/07/2022, 11:14 AM

## 2022-07-07 NOTE — Evaluation (Signed)
Physical Therapy Evaluation Patient Details Name: Joshua Everett MRN: [id number] DOB: 1934-11-08 Today's Date: 07/07/2022  History of Present Illness  Joshua Everett is an 87yoM who comes to [facility] on 07/06/22 after several falls at home. Wife is concerned that she can no longer meet the patient's care needs at home. PMH: Parkinson's disease, dementia. 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 Hip xray report says "Possible fracture of the right sacral ala. Consider CT of the pelvis." Chest CT revealing of acute PE RLL 'overall clot burden is very small.'  Clinical Impression   Pt in bed on entry, wife Bethena Roys at bedside. Home setup and PLOF gathered. Wife is sole caregiver for patient, reports she has been providing extensive physical assistance for pt bed mobility and transfers, but he is typically AMB in house ad lib with upwalker or sometimes nothing at all due to wandering and memory issues. Mobility assist is investigated in depth and it seems that cognitive disorientation to task may account for the majority of difficulty. Pt has been working with HHPT for >6 months per wife, focus on household mobility, balance interventions. Unfortunately pt appears to have more cognitive related falls risk factors than those typically attributed to Parkinson's. 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 Pt requires minA to come to EOB and heavy min to modA for coming to standing, both of which could be mediated with use of the pt's adjustable bed at home. It does not appear that pt is notably weaker than his baseline, but quite the contrary per wife's report. We discuss the plan for transition to a higher level of care, which pt and wife have discussed and has  been recently investigated by wife. Anticipate once medically appropriate, pt could DC back to home with additional supervision for safety, continue with PT/OT in home until LTC can be established, consider limited AMB distances for safety and more routine use of WC for longer periods of standing. Will continue to follow.      Recommendations for follow up therapy are one component of a multi-disciplinary discharge planning process, led by the attending physician.  Recommendations may be updated based on patient status, additional functional criteria and insurance authorization.  Follow Up Recommendations Home health PT (difficult to say pt has any true rehab needs based on today's assessment; falls appear multifactorial but suspect heavy influence from progression in Parkinson's disease and cognitive decline. Supervision needs greater at this time.)      Assistance Recommended at Discharge Frequent or constant Supervision/Assistance  Patient can return home with the following  A lot of help with bathing/dressing/bathroom;A lot of help with walking and/or transfers    Equipment Recommendations None recommended by PT  Recommendations for Other Services       Functional Status Assessment Patient has not had a recent decline in their functional status     Precautions / Restrictions Precautions Precautions: Fall Restrictions Weight Bearing Restrictions: No      Mobility  Bed Mobility Overal bed mobility: Needs Assistance Bed Mobility: Supine to Sit     Supine to sit: Min assist          Transfers Overall transfer level: Needs assistance Equipment used: 1 person hand held assist Transfers: Sit to/from Stand, Bed to chair/wheelchair/BSC Sit to Stand:  Mod assist   Step pivot transfers: Min guard       General transfer comment: pt given PWR! cues    Ambulation/Gait Ambulation/Gait assistance:  (deferred; BP very elevated, pt pending additional imaging studies)                 Stairs            Wheelchair Mobility    Modified Rankin (Stroke Patients Only)       Balance Overall balance assessment: History of Falls                                           Pertinent Vitals/Pain Pain Assessment Pain Assessment: No/denies pain    Home Living Family/patient expects to be discharged to:: Private residence Living Arrangements: Spouse/significant other Available Help at Discharge: Family Type of Home: House Home Access: Stairs to enter   [employer] of Steps: 1 half step   Home Layout: One level Home Equipment: Conservation officer, nature (2 wheels);Rollator (4 wheels);Shower seat;Wheelchair - manual (upwalker;)      Prior Function Prior Level of Function : Needs assist;History of Falls (last six months)                     Hand Dominance        Extremity/Trunk Assessment   Upper Extremity Assessment Upper Extremity Assessment: Generalized weakness;Overall [facility] for tasks assessed    Lower Extremity Assessment Lower Extremity Assessment: Generalized weakness;Overall WFL for tasks assessed       Communication      Cognition Arousal/Alertness: Awake/alert Behavior During Therapy: WFL for tasks assessed/performed Overall Cognitive Status: History of cognitive impairments - at baseline                                 General Comments: follows 1-step commands with success        General Comments      Exercises     Assessment/Plan    PT Assessment Patient needs continued PT services  PT Problem List Decreased activity tolerance;Decreased balance;Decreased mobility;Decreased safety awareness;Decreased knowledge of precautions;Decreased cognition       PT Treatment Interventions DME instruction;Gait training;Stair training;Functional mobility training;Therapeutic activities;Therapeutic exercise;Balance training;Patient/family education    PT Goals (Current goals can be found in  the Care Plan section)  Acute Rehab PT Goals Patient Stated Goal: wife has been looking into potential options for increasing care for patient, including moving in to ALF PT Goal Formulation: With patient Time For Goal Achievement: 07/21/22 Potential to Achieve Goals: Poor    Frequency Min 2X/week     Co-evaluation               AM-PAC PT "6 Clicks" Mobility  Outcome Measure Help needed turning from your back to your side while in a flat bed without using bedrails?: A Lot Help needed moving from lying on your back to sitting on the side of a flat bed without using bedrails?: A Lot Help needed moving to and from a bed to a chair (including a wheelchair)?: A Lot Help needed standing up from a chair using your arms (e.g., wheelchair or bedside chair)?: A Lot Help needed to walk in hospital room?: A Little Help needed climbing 3-5 steps with a railing? : A Little 6 Click Score: 14  End of Session   Activity Tolerance: Patient tolerated treatment well;No increased pain;Treatment limited secondary to medical complications (Comment) Patient left: in chair;with family/visitor present;with nursing/sitter in room Nurse Communication: Mobility status PT Visit Diagnosis: Unsteadiness on feet (R26.81);Difficulty in walking, not elsewhere classified (R26.2);Repeated falls (R29.6)    Time: 2778-2423 PT Time Calculation (min) (ACUTE ONLY): 30 min   Charges:   PT Evaluation $PT Eval High Complexity: 1 High PT Treatments $Therapeutic Activity: 8-22 mins       12:48 PM, 07/07/22 Etta Grandchild, PT, DPT Physical Therapist - [facility]  [phone] ([place])    [place] 07/07/2022, 12:40 PM

## 2022-07-07 NOTE — Consult Note (Signed)
ANTICOAGULATION CONSULT NOTE - Initial Consult  Pharmacy Consult for heparin infusion Indication: pulmonary embolus  Allergies  Allergen Reactions   Solifenacin Other (See Comments)    Hallucinations (per uro)    Patient Measurements: Height: '5\' 10"'$  (177.8 cm) Weight: 95.4 kg (210 lb 5.1 oz) IBW/kg (Calculated) : 73 Heparin Dosing Weight: 92.5 kg  Vital Signs: Temp: 97.5 F (36.4 C) (02/02 0831) Temp Source: Oral (02/02 0546) BP: 146/80 (02/02 1330) Pulse Rate: 52 (02/02 0831)  Labs: Recent Labs    07/06/22 1350 07/07/22 1303  HGB 14.6  --   HCT 44.1  --   PLT 172  --   HEPARINUNFRC  --  0.84*  CREATININE 0.91  --      Estimated Creatinine Clearance: 68.8 mL/min (by C-G formula based on SCr of 0.91 mg/dL).   Medical History: Past Medical History:  Diagnosis Date   Actinic keratosis    Basal cell carcinoma 09/03/2014   right of midline forehead   Basal cell carcinoma 03/25/2019   right lower nasal dorsum, EDC   Basal cell carcinoma 03/30/2020   Mid nasal tip, EDC   Basal cell carcinoma (BCC) 01/28/2015   right proximal nasal alar rim   BPH without obstruction/lower urinary tract symptoms 09/08/2014   Chronic constipation 09/08/2014   GERD (gastroesophageal reflux disease)    Glaucoma    History of chicken pox    Hyperlipidemia    Obesity, Class I, BMI 30-34.9 09/08/2014   Prediabetes 07/31/2015   Rosacea 07/06/2015   Squamous cell carcinoma of skin 01/30/2007   mid back, in situ   Squamous cell carcinoma of skin 03/12/2018   right forearm, in situ   Squamous cell carcinoma of skin 04/22/2018   left pretibial   Squamous cell carcinoma of skin 09/24/2018   right hand dorsum, in situ   Urine incontinence     Medications:  Heparin 5000 units SQ: last dose 2/2 0330  Assessment: 88 y.o. male coming to ED for multiple falls. More so over past 3 days. Patient with elevated d-dimer and CT positive for RLL pulmonary embolism. Pharmacy has been consulted to  initiate and manage IV heparin infusion.  Goal of Therapy:  Heparin level 0.3-0.7 units/ml Monitor platelets by anticoagulation protocol: Yes  Date/Time HL Rate 2/2'@1303'$  0.84 SUPRAtherapeutic'@1500ml'$ /hr   Plan:  Will decrease heparin infusion to 1300 units/hr Check anti-Xa level in 8 hours after rate change Continue to monitor H&H and platelets  Pearla Dubonnet, PharmD Clinical Pharmacist 07/07/2022 2:28 PM

## 2022-07-07 NOTE — Assessment & Plan Note (Signed)
Per neurology note patient's Parkinson is worsening and has slowing of movement and even swallowing issues. Will continue his carbidopa levodopa regimen.

## 2022-07-08 ENCOUNTER — Inpatient Hospital Stay
Admit: 2022-07-08 | Discharge: 2022-07-08 | Disposition: A | Discharge status: Home / Self Care | Payer: Medicare Other | Attending: Internal Medicine | Admitting: Internal Medicine

## 2022-07-08 DIAGNOSIS — I6389 Other cerebral infarction: Secondary | ICD-10-CM

## 2022-07-08 DIAGNOSIS — G20A1 Parkinson's disease without dyskinesia, without mention of fluctuations: Secondary | ICD-10-CM

## 2022-07-08 LAB — CBC
HCT: 43.5 % (ref 39.0–52.0)
Hemoglobin: 14.9 g/dL (ref 13.0–17.0)
MCH: 30.1 pg (ref 26.0–34.0)
MCHC: 34.3 g/dL (ref 30.0–36.0)
MCV: 87.9 fL (ref 80.0–100.0)
Platelets: 172 10*3/uL (ref 150–400)
RBC: 4.95 MIL/uL (ref 4.22–5.81)
RDW: 13 % (ref 11.5–15.5)
WBC: 6.3 10*3/uL (ref 4.0–10.5)
nRBC: 0 % (ref 0.0–0.2)

## 2022-07-08 LAB — BASIC METABOLIC PANEL
Anion gap: 7 (ref 5–15)
BUN: 13 mg/dL (ref 8–23)
CO2: 28 mmol/L (ref 22–32)
Calcium: 9.9 mg/dL (ref 8.9–10.3)
Chloride: 108 mmol/L (ref 98–111)
Creatinine, Ser: 0.83 mg/dL (ref 0.61–1.24)
GFR, Estimated: >60 mL/min (ref >60)
Glucose, Bld: 119 mg/dL — ABNORMAL HIGH (ref 70–99)
Potassium: 3.4 mmol/L — ABNORMAL LOW (ref 3.5–5.1)
Sodium: 143 mmol/L (ref 135–145)

## 2022-07-08 LAB — GLUCOSE, CAPILLARY: Glucose-Capillary: 101 mg/dL — ABNORMAL HIGH (ref 70–99)

## 2022-07-08 LAB — HEPARIN LEVEL (UNFRACTIONATED): Heparin Unfractionated: 0.68 IU/mL (ref 0.30–0.70)

## 2022-07-08 LAB — ECHOCARDIOGRAM COMPLETE
AR max vel: 3.78 cm2
AV Peak grad: 5.4 mmHg
Ao pk vel: 1.17 m/s
Area-P 1/2: 3.27 cm2
Height: 70 in
S' Lateral: 3.7 cm
Weight: 3365.1 [oz_av]

## 2022-07-08 MED ORDER — LISINOPRIL 10 MG PO TABS
10.0000 mg | ORAL_TABLET | Freq: Two times a day (BID) | ORAL | Status: DC
  Administered 2022-07-08 – 2022-07-14 (×12): 10 mg via ORAL
  Filled 2022-07-08 (×12): qty 1

## 2022-07-08 MED ORDER — POTASSIUM CHLORIDE CRYS ER 20 MEQ PO TBCR
40.0000 meq | EXTENDED_RELEASE_TABLET | Freq: Once | ORAL | Status: AC
  Administered 2022-07-08: 40 meq via ORAL
  Filled 2022-07-08: qty 2

## 2022-07-08 MED ORDER — LISINOPRIL 10 MG PO TABS: 10.0000 mg | ORAL_TABLET | Freq: Every day | ORAL | 0 refills | Status: DC

## 2022-07-08 MED ORDER — HYDROCHLOROTHIAZIDE 12.5 MG PO TABS: 6.2500 mg | ORAL_TABLET | Freq: Every day | ORAL | 0 refills | Status: AC

## 2022-07-08 NOTE — Progress Notes (Signed)
07/08/22: 125 pm. Patient is not able to walk the distance required to go the bathroom, or he/she is unable to safely negotiate stairs required to access the bathroom.  A BSC will alleviate this problem

## 2022-07-08 NOTE — Plan of Care (Signed)
  Problem: Education: Goal: Knowledge of disease or condition will improve Outcome: Progressing Goal: Knowledge of secondary prevention will improve (MUST DOCUMENT ALL) Outcome: Progressing Goal: Knowledge of patient specific risk factors will improve [name] N/A or DELETE if not current risk factor) Outcome: Progressing   Problem: Ischemic Stroke/TIA Tissue Perfusion: Goal: Complications of ischemic stroke/TIA will be minimized Outcome: Progressing   Problem: Coping: Goal: Will verbalize positive feelings about self Outcome: Progressing Goal: Will identify appropriate support needs Outcome: Progressing   Problem: Health Behavior/Discharge Planning: Goal: Ability to manage health-related needs will improve Outcome: Progressing Goal: Goals will be collaboratively established with patient/family Outcome: Progressing   Problem: Self-Care: Goal: Ability to participate in self-care as condition permits will improve Outcome: Progressing Goal: Verbalization of feelings and concerns over difficulty with self-care will improve Outcome: Progressing Goal: Ability to communicate needs accurately will improve Outcome: Progressing   Problem: Nutrition: Goal: Risk of aspiration will decrease Outcome: Progressing Goal: Dietary intake will improve Outcome: Progressing   Problem: Education: Goal: Knowledge of General Education information will improve Description: Including pain rating scale, medication(s)/side effects and non-pharmacologic comfort measures Outcome: Progressing   Problem: Health Behavior/Discharge Planning: Goal: Ability to manage health-related needs will improve Outcome: Progressing   Problem: Clinical Measurements: Goal: Ability to maintain clinical measurements within normal limits will improve Outcome: Progressing Goal: Will remain free from infection Outcome: Progressing Goal: Diagnostic test results will improve Outcome: Progressing Goal: Respiratory  complications will improve Outcome: Progressing Goal: Cardiovascular complication will be avoided Outcome: Progressing   Problem: Activity: Goal: Risk for activity intolerance will decrease Outcome: Progressing   Problem: Nutrition: Goal: Adequate nutrition will be maintained Outcome: Progressing   Problem: Coping: Goal: Level of anxiety will decrease Outcome: Progressing   Problem: Elimination: Goal: Will not experience complications related to bowel motility Outcome: Progressing Goal: Will not experience complications related to urinary retention Outcome: Progressing   Problem: Pain Managment: Goal: General experience of comfort will improve Outcome: Progressing   Problem: Safety: Goal: Ability to remain free from injury will improve Outcome: Progressing   Problem: Skin Integrity: Goal: Risk for impaired skin integrity will decrease Outcome: Progressing   Problem: Education: Goal: Knowledge of General Education information will improve Description: Including pain rating scale, medication(s)/side effects and non-pharmacologic comfort measures Outcome: Progressing   Problem: Health Behavior/Discharge Planning: Goal: Ability to manage health-related needs will improve Outcome: Progressing   Problem: Clinical Measurements: Goal: Ability to maintain clinical measurements within normal limits will improve Outcome: Progressing Goal: Will remain free from infection Outcome: Progressing Goal: Diagnostic test results will improve Outcome: Progressing Goal: Respiratory complications will improve Outcome: Progressing Goal: Cardiovascular complication will be avoided Outcome: Progressing   Problem: Activity: Goal: Risk for activity intolerance will decrease Outcome: Progressing   Problem: Nutrition: Goal: Adequate nutrition will be maintained Outcome: Progressing   Problem: Coping: Goal: Level of anxiety will decrease Outcome: Progressing   Problem:  Elimination: Goal: Will not experience complications related to bowel motility Outcome: Progressing Goal: Will not experience complications related to urinary retention Outcome: Progressing   Problem: Pain Managment: Goal: General experience of comfort will improve Outcome: Progressing   Problem: Safety: Goal: Ability to remain free from injury will improve Outcome: Progressing   Problem: Skin Integrity: Goal: Risk for impaired skin integrity will decrease Outcome: Progressing

## 2022-07-08 NOTE — Evaluation (Signed)
Occupational Therapy Evaluation Patient Details Name: Joshua Everett MRN: [id number] DOB: [birth year] Today's Date: 07/08/2022   History of Present Illness Joshua Everett is an 87yoM who comes to [facility] on 07/06/22 after several falls at home. Wife is concerned that she can no longer meet the patient's care needs at home. PMH: Parkinson's disease, dementia. 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 Hip xray report says "Possible fracture of the right sacral ala. Consider CT of the pelvis." Chest CT revealing of acute PE RLL 'overall clot burden is very small.'   Clinical Impression   Patient cleared by MD for participation in therapy. Pt presenting with decreased independence in self care, balance, functional mobility/transfers, endurance, and safety awareness. Pt has cognitive deficits at baseline, therefore, PLOF obtained from wife at bedside. He was oriented to self only and required max multimodal cues for initiation/sequencing/following single step commands t/o session. Pt currently functioning at Mod-Max A for bed mobility, Min-Mod A +2 for simulated toilet transfer, and Min-Mod A +2 to take side steps at EOB using RW. Pt will benefit from acute OT to increase overall independence in the areas of ADLs and functional mobility in order to safely discharge home. Pt would benefit from [facility] in familiar environment with 24/7 supervision, assistance for all ADLs/IADLs, assistance for functional mobility/transfers, and a BSC.    Recommendations for follow up therapy are one component of a multi-disciplinary discharge planning process, led by the attending physician.  Recommendations may be updated based on patient status, additional functional criteria and insurance authorization.   Follow Up Recommendations  Home health OT ([facility] services - OT/PT/RN/aide)     Assistance Recommended at Discharge Frequent or constant Supervision/Assistance   Patient can return home with the following Direct supervision/assist for financial management;Direct supervision/assist for medications management;Assistance with cooking/housework;Assist for transportation;Help with stairs or ramp for entrance;A lot of help with bathing/dressing/bathroom;A lot of help with walking and/or transfers    Functional Status Assessment  Patient has had a recent decline in their functional status and demonstrates the ability to make significant improvements in function in a reasonable and predictable amount of time.  Equipment Recommendations  BSC/3in1    Recommendations for Other Services       Precautions / Restrictions Precautions Precautions: Fall Restrictions Weight Bearing Restrictions: No      Mobility Bed Mobility Overal bed mobility: Needs Assistance Bed Mobility: Supine to Sit, Sit to Supine     Supine to sit: Max assist, HOB elevated Sit to supine: Mod assist (pt able to assist with bringing BLEs back onto bed)   General bed mobility comments: Max A to scoot hips forward at EOB    Transfers Overall transfer level: Needs assistance Equipment used: Rolling walker (2 wheels) Transfers: Sit to/from Stand Sit to Stand: Min assist, Mod assist, +2 physical assistance           General transfer comment: completed sit<>stand 3x in order to change linens, mesh underwear, & for NT to place new purewick      Balance Overall balance assessment: History of Falls, Needs assistance Sitting-balance support: Feet supported Sitting balance-Leahy Scale: Good     Standing balance support: Bilateral upper extremity supported, Reliant on assistive device for balance, During functional activity Standing balance-Leahy Scale: Fair                             ADL either performed  or assessed with clinical judgement   ADL Overall ADL's : Needs assistance/impaired Eating/Feeding: Set up;Sitting Eating/Feeding Details (indicate cue type  and reason): wife assisted with cutting up food             Upper Body Dressing : Moderate assistance;Sitting Upper Body Dressing Details (indicate cue type and reason): to don/doff soiled gown Lower Body Dressing: Maximal assistance;Sitting/lateral leans;Sit to/from stand Lower Body Dressing Details (indicate cue type and reason): socks and mesh underwear Toilet Transfer: Minimal assistance;Moderate assistance;+2 for physical assistance;Rolling walker (2 wheels) Toilet Transfer Details (indicate cue type and reason): simulated Toileting- Clothing Manipulation and Hygiene: Maximal assistance;Sit to/from stand       Functional mobility during ADLs: Minimal assistance;Moderate assistance;+2 for physical assistance;Rolling walker (2 wheels) (to take a couple side steps at EOB, assist for RW management, difficulty coordinating use of RW, not much improved with HHA) General ADL Comments: Bed soaked from [place] leaking, required complete linen change     Vision Baseline Vision/History: 1 Wears glasses Patient Visual Report: No change from baseline       Perception     Praxis      Pertinent Vitals/Pain Pain Assessment Pain Assessment: No/denies pain     Hand Dominance     Extremity/Trunk Assessment Upper Extremity Assessment Upper Extremity Assessment: Generalized weakness   Lower Extremity Assessment Lower Extremity Assessment: Generalized weakness       Communication Communication Communication: No difficulties   Cognition Arousal/Alertness: Awake/alert Behavior During Therapy: Restless, Flat affect Overall Cognitive Status: History of cognitive impairments - at baseline                                 General Comments: Pt with dementia at baseline. Oriented to self only. Required max multimodal cues for initiation/sequencing/following single step commands. Pt attempting to pull off purewick t/o despite re-education.     General Comments        Exercises Other Exercises Other Exercises: OT provided education re: role of OT, OT POC, post acute recs, sitting up for all meals, EOB/OOB mobility with assistance, home/fall safety.    Shoulder Instructions      Home Living Family/patient expects to be discharged to:: Private residence Living Arrangements: Spouse/significant other Available Help at Discharge: Family;Available 24 hours/day Type of Home: House Home Access: Stairs to enter [employer] of Steps: 1 half step   Home Layout: One level     Bathroom Shower/Tub: Walk-in shower         Home Equipment: Conservation officer, nature (2 wheels);Rollator (4 wheels);Shower seat;Wheelchair - manual;Hand held shower head;Grab bars - tub/shower (upright walker)          Prior Functioning/Environment Prior Level of Function : Needs assist;History of Falls (last six months)  Cognitive Assist : Mobility (cognitive);ADLs (cognitive) Mobility (Cognitive): Step by step cues ADLs (Cognitive): Step by step cues Physical Assist : Mobility (physical);ADLs (physical)       ADLs Comments: Wife assists with all ADLs/IADLs, reports pt can complete self-feeding with set up A, provides assist for shower transfer and supervision for bathing, urinary incontinence at baseline (wears depends)        OT Problem List: Decreased strength;Decreased activity tolerance;Impaired balance (sitting and/or standing);Decreased cognition;Decreased safety awareness;Decreased coordination      OT Treatment/Interventions: Self-care/ADL training;Therapeutic exercise;Therapeutic activities;Cognitive remediation/compensation;Energy conservation;DME and/or AE instruction;Patient/family education;Balance training    OT Goals(Current goals can be found in the care plan section) Acute Rehab OT  Goals Patient Stated Goal: wife wants pt to get stronger, looking into hiring caregiver to help her out with pt during the day OT Goal Formulation: With family Time For  Goal Achievement: 07/22/22 Potential to Achieve Goals: Fair   OT Frequency: Min 2X/week    Co-evaluation              AM-PAC OT "6 Clicks" Daily Activity     Outcome Measure Help from another person eating meals?: A Little Help from another person taking care of personal grooming?: A Little Help from another person toileting, which includes using toliet, bedpan, or urinal?: A Lot Help from another person bathing (including washing, rinsing, drying)?: A Lot Help from another person to put on and taking off regular upper body clothing?: A Lot Help from another person to put on and taking off regular lower body clothing?: A Lot 6 Click Score: 14   End of Session Equipment Utilized During Treatment: Gait belt;Rolling walker (2 wheels) Nurse Communication: Mobility status  Activity Tolerance: Patient tolerated treatment well;Patient limited by fatigue Patient left: in bed;with call bell/phone within reach;with bed alarm set;with family/visitor present  OT Visit Diagnosis: Other abnormalities of gait and mobility (R26.89);Other symptoms and signs involving cognitive function;Muscle weakness (generalized) (M62.81);History of falling (Z91.81)                Time: 9295-7473 OT Time Calculation (min): 37 min Charges:  OT General Charges $OT Visit: 1 Visit OT Evaluation $OT Eval Moderate Complexity: 1 Mod OT Treatments $Self Care/Home Management : 8-22 mins  [facility] MS, OTR/L ascom [phone]  07/08/22, 2:13 PM

## 2022-07-08 NOTE — Consult Note (Signed)
ANTICOAGULATION CONSULT NOTE - Initial Consult  Pharmacy Consult for heparin infusion Indication: pulmonary embolus  Allergies  Allergen Reactions   Solifenacin Other (See Comments)    Hallucinations (per uro)    Patient Measurements: Height: '5\' 10"'$  (177.8 cm) Weight: 95.4 kg (210 lb 5.1 oz) IBW/kg (Calculated) : 73 Heparin Dosing Weight: 92.5 kg  Vital Signs: Temp: 98.6 F (37 C) (02/02 2354) BP: 192/80 (02/02 2354) Pulse Rate: 51 (02/02 2354)  Labs: Recent Labs    07/06/22 1350 07/07/22 1303 07/07/22 2255  HGB 14.6  --   --   HCT 44.1  --   --   PLT 172  --   --   HEPARINUNFRC  --  0.84* 0.67  CREATININE 0.91  --   --      Estimated Creatinine Clearance: 68.8 mL/min (by C-G formula based on SCr of 0.91 mg/dL).   Medical History: Past Medical History:  Diagnosis Date   Actinic keratosis    Basal cell carcinoma 09/03/2014   right of midline forehead   Basal cell carcinoma 03/25/2019   right lower nasal dorsum, EDC   Basal cell carcinoma 03/30/2020   Mid nasal tip, EDC   Basal cell carcinoma (BCC) 01/28/2015   right proximal nasal alar rim   BPH without obstruction/lower urinary tract symptoms 09/08/2014   Chronic constipation 09/08/2014   GERD (gastroesophageal reflux disease)    Glaucoma    History of chicken pox    Hyperlipidemia    Obesity, Class I, BMI 30-34.9 09/08/2014   Prediabetes 07/31/2015   Rosacea 07/06/2015   Squamous cell carcinoma of skin 01/30/2007   mid back, in situ   Squamous cell carcinoma of skin 03/12/2018   right forearm, in situ   Squamous cell carcinoma of skin 04/22/2018   left pretibial   Squamous cell carcinoma of skin 09/24/2018   right hand dorsum, in situ   Urine incontinence     Medications:  Heparin 5000 units SQ: last dose 2/2 0330  Assessment: 88 y.o. male coming to ED for multiple falls. More so over past 3 days. Patient with elevated d-dimer and CT positive for RLL pulmonary embolism. Pharmacy has been consulted  to initiate and manage IV heparin infusion.  Goal of Therapy:  Heparin level 0.3-0.7 units/ml Monitor platelets by anticoagulation protocol: Yes  Date/Time HL Rate 2/2'@1303'$  0.84 SUPRAtherapeutic'@1500ml'$ /hr 2/2'@2255'$  0.67 Therapeutic @ 1300 units/hr   Plan:  Heparin therapeutic Continue heparin infusion at 1300 units/hr Check anti-Xa level in 8 hours to confirm Continue to monitor H&H and platelets  Dorothe Pea, PharmD, BCPS Clinical Pharmacist   07/08/2022 12:10 AM

## 2022-07-08 NOTE — Consult Note (Signed)
ANTICOAGULATION CONSULT NOTE - Initial Consult  Pharmacy Consult for heparin infusion Indication: pulmonary embolus  Allergies  Allergen Reactions   Solifenacin Other (See Comments)    Hallucinations (per uro)    Patient Measurements: Height: '5\' 10"'$  (177.8 cm) Weight: 95.4 kg (210 lb 5.1 oz) IBW/kg (Calculated) : 73 Heparin Dosing Weight: 92.5 kg  Vital Signs: Temp: 98.6 F (37 C) (02/03 0838) BP: 189/96 (02/03 0838) Pulse Rate: 57 (02/03 0838)  Labs: Recent Labs    07/06/22 1350 07/07/22 1303 07/07/22 2255 07/08/22 0459 07/08/22 0755  HGB 14.6  --   --  14.9  --   HCT 44.1  --   --  43.5  --   PLT 172  --   --  172  --   HEPARINUNFRC  --  0.84* 0.67  --  0.68  CREATININE 0.91  --   --  0.83  --      Estimated Creatinine Clearance: 75.5 mL/min (by C-G formula based on SCr of 0.83 mg/dL).   Medical History: Past Medical History:  Diagnosis Date   Actinic keratosis    Basal cell carcinoma 09/03/2014   right of midline forehead   Basal cell carcinoma 03/25/2019   right lower nasal dorsum, EDC   Basal cell carcinoma 03/30/2020   Mid nasal tip, EDC   Basal cell carcinoma (BCC) 01/28/2015   right proximal nasal alar rim   BPH without obstruction/lower urinary tract symptoms 09/08/2014   Chronic constipation 09/08/2014   GERD (gastroesophageal reflux disease)    Glaucoma    History of chicken pox    Hyperlipidemia    Obesity, Class I, BMI 30-34.9 09/08/2014   Prediabetes 07/31/2015   Rosacea 07/06/2015   Squamous cell carcinoma of skin 01/30/2007   mid back, in situ   Squamous cell carcinoma of skin 03/12/2018   right forearm, in situ   Squamous cell carcinoma of skin 04/22/2018   left pretibial   Squamous cell carcinoma of skin 09/24/2018   right hand dorsum, in situ   Urine incontinence     Medications:  Heparin 5000 units SQ: last dose 2/2 0330  Assessment: 88 y.o. male coming to ED for multiple falls. More so over past 3 days. Patient with elevated  d-dimer and CT positive for RLL pulmonary embolism. Pharmacy has been consulted to initiate and manage IV heparin infusion.  Goal of Therapy:  Heparin level 0.3-0.7 units/ml Monitor platelets by anticoagulation protocol: Yes  Date/Time HL Rate 2/2'@1303'$  0.84 SUPRAtherapeutic'@1500'$  units/hr 2/2'@2255'$  0.67 Therapeutic @ 1300 units/hr 2/2'@0755'$  0.68 Therapeutic x2 @ 1300 units/hr   Plan:  Heparin therapeutic x2 Continue heparin infusion at 1300 units/hr Recheck anti-Xa level with AM labs Continue to monitor H&H and platelets  Pearla Dubonnet, PharmD Clinical Pharmacist 07/08/2022 8:47 AM

## 2022-07-08 NOTE — TOC Transition Note (Addendum)
Transition of Care [facility]) - CM/SW Discharge Note   Patient Details  Name: Joshua Everett MRN: [id number] Date of Birth: [birth year]  Transition of Care [facility]) CM/SW Contact:  Izola Price, RN Phone Number: 07/08/2022, 1:29 PM   Clinical Narrative: 2/3: Patient to be discharge today potentially. Spoke with spouse and they are active with a [place] agency that she could not remember the name of. She has all needed DME but requested a 3:1, narrative and order placed. [employer] notified. DC HH orders for PT, OT, RN, aid, and SW in the chart. Spouse to let RN CM know [facility] agency name or go through neurologist, Dr Manuella Ghazi. She is comfortable going home and contacting the [facility] agency as well with the orders in place. [employer] contact number given as well for additional help/direction post discharge. Requested 3:1 DME to be delivered to home address on Sunday 07/09/22. Already has WC, Parkinsons walker, shower seat, grab bars, bedrail at home. Barbie Terel Bann RN CM   142 pm. Spouse called back, active with [facility]. Notified of new [place] orders and potential DC today.   Final next level of care: [place] Barriers to Discharge: Barriers Resolved   Patient Goals and CMS Choice      Discharge Placement                         Discharge Plan and Services Additional resources added to the After Visit Summary for                  DME Arranged: 3-N-1 DME Agency: [employer]       HH Arranged: RN, OT, PT, Nurse's Aide, Social Work [employer] Agency: Other - See comment (Patient active with an agency that she will call RN CM back about when she gets home. DC orders on in for all the listed services.)        Social Determinants of Health (SDOH) Interventions SDOH Screenings   Food Insecurity: No Food Insecurity (07/06/2022)  Housing: Low Risk  (07/06/2022)  Transportation Needs: No Transportation Needs (07/06/2022)  Utilities: Not At Risk (07/06/2022)  Alcohol Screen: Low Risk  (01/27/2022)   Depression (PHQ2-9): Medium Risk (06/19/2022)  Financial Resource Strain: Low Risk  (01/27/2022)  Physical Activity: Insufficiently Active (01/27/2022)  Social Connections: Moderately Isolated (01/27/2022)  Stress: No Stress Concern Present (01/27/2022)  Tobacco Use: Medium Risk (07/06/2022)     Readmission Risk Interventions     No data to display

## 2022-07-08 NOTE — Consult Note (Addendum)
ANTICOAGULATION CONSULT NOTE  Pharmacy Consult for Heparin Infusion Indication: pulmonary embolus  Allergies  Allergen Reactions   Solifenacin Other (See Comments)    Hallucinations (per uro)    Patient Measurements: Height: '5\' 10"'$  (177.8 cm) Weight: 95.4 kg (210 lb 5.1 oz) IBW/kg (Calculated) : 73 Heparin Dosing Weight: 92.5 kg  Vital Signs: Temp: 98.6 F (37 C) (02/03 0838) BP: 189/96 (02/03 0838) Pulse Rate: 57 (02/03 0838)  Labs: Recent Labs    07/06/22 1350 07/07/22 1303 07/07/22 2255 07/08/22 0459 07/08/22 0755  HGB 14.6  --   --  14.9  --   HCT 44.1  --   --  43.5  --   PLT 172  --   --  172  --   HEPARINUNFRC  --  0.84* 0.67  --  0.68  CREATININE 0.91  --   --  0.83  --     Estimated Creatinine Clearance: 75.5 mL/min (by C-G formula based on SCr of 0.83 mg/dL).   Medical History: Past Medical History:  Diagnosis Date   Actinic keratosis    Basal cell carcinoma 09/03/2014   right of midline forehead   Basal cell carcinoma 03/25/2019   right lower nasal dorsum, EDC   Basal cell carcinoma 03/30/2020   Mid nasal tip, EDC   Basal cell carcinoma (BCC) 01/28/2015   right proximal nasal alar rim   BPH without obstruction/lower urinary tract symptoms 09/08/2014   Chronic constipation 09/08/2014   GERD (gastroesophageal reflux disease)    Glaucoma    History of chicken pox    Hyperlipidemia    Obesity, Class I, BMI 30-34.9 09/08/2014   Prediabetes 07/31/2015   Rosacea 07/06/2015   Squamous cell carcinoma of skin 01/30/2007   mid back, in situ   Squamous cell carcinoma of skin 03/12/2018   right forearm, in situ   Squamous cell carcinoma of skin 04/22/2018   left pretibial   Squamous cell carcinoma of skin 09/24/2018   right hand dorsum, in situ   Urine incontinence     Medications:  Scheduled:   aspirin EC  81 mg Oral Daily   carbidopa-levodopa  2 tablet Oral QID   hydrochlorothiazide  6.25 mg Oral Daily   latanoprost  1 drop Both Eyes QHS    lisinopril  10 mg Oral Daily   potassium chloride  40 mEq Oral Once   Infusions:    PRN: hydrALAZINE  Assessment: Joshua Everett is a 88 y.o. male presenting after multiple falls. PMH significant for GERD, HTN, Parkinson's disease, BPH, HLD. Patient was not on [facility] PTA per chart review. Patient was started on [facility] for DVT ppx. Last dose SQ heparin 5000 units was 2/2 0330. Pharmacy has been consulted to initiate and manage heparin infusion.   Per MD, will discontinue heparin gtt after risk/benefit discussion with wife (new PE and hx falls).  Baseline Labs: Hgb 14.6, Hct 44.1, Plt 172   Goal of Therapy:  Heparin level 0.3-0.7 units/ml Monitor platelets by anticoagulation protocol: Yes    Plan:  Discontinue heparin infusion Pharmacy will sign off and continue to monitor peripherally   Gretel Acre, PharmD PGY1 Pharmacy Resident 07/08/2022 9:03 AM

## 2022-07-08 NOTE — Discharge Summary (Signed)
Discharge Summary  Joshua Everett VVO:[id number] DOB: January 31, 1935  PCP: Ria Bush, MD  Admit date: 07/06/2022 Discharge date: 07/08/2022  Recommendations for Outpatient Follow-up:  Please follow up with your PCP with CBC and BMP in 1-2 weeks Please follow-up with your neurologist Dr. Manuella Ghazi within the next 2 weeks.  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   Discharge Condition: Stable   Diet recommendation: Diet Orders (From admission, onward)     Start     Ordered   07/07/22 1128  Diet Heart Room service appropriate? Yes; Fluid consistency: Thin  Diet effective now       Question Answer Comment  Room service appropriate? Yes   Fluid consistency: Thin      07/07/22 1127           HPI and Brief Hospital Course:  88 yo male who lives independently with his wife and has a history of HTN, HLD, parkinsons and was admitted for multiple falls at home.  He was seen yesterday morning with his wife at the bedside, today she was not present but I did update her over the phone.  He was started on Heparin gtt overnight for small nonocclusive PE seen on CTA chest.  There was some question on x-ray with possible sacral ala fracture, he had CT scan of the pelvis yesterday, which ruled out an acute fracture.  In the meantime, he also had bilateral lower extremity Dopplers, which ruled out DVT.  I had extensive conversations with the patient's wife yesterday as well as today, discussing the risks and benefits of anticoagulation for this gentleman who has worsening parkinsonism and multiple falls at home.  After discussion, the patient's  wife has elected to forego full anticoagulation at this time, instead we will simply continue aspirin.  Otherwise, the patient has remained stable and asymptomatic, he was seen by PT/OT during this hospital stay and they are recommending home health services.  This was relayed to the patient's wife, and she is agreeable to discharge him home with home health today.  She will follow-up closely as an outpatient with his neurologist Dr. Manuella Ghazi, as well as with PCP.  Discharge details, plan of care and follow up instructions were discussed with patient and any available family or care providers. Patient and family are in agreement with discharge from the hospital today and all questions were answered to their satisfaction.  Discharge Exam: BP (!) 156/82   Pulse (!) 56   Temp 98.6 F (37 C)   Resp 16   Ht '5\' 10"'$  (1.778 m)   Wt 95.4 kg   SpO2 98%   BMI 30.18 kg/m  General:  Alert, oriented to self, calm, in no acute distress  Eyes: EOMI, clear sclerea Neck: supple, no masses, trachea mildline  Cardiovascular: RRR, no murmurs or rubs, no peripheral edema  Respiratory: clear to auscultation bilaterally, no wheezes, no crackles  Abdomen: soft, nontender, nondistended, normal bowel tones heard  Skin: dry, no rashes  Musculoskeletal: no joint effusions, normal range of motion  Psychiatric: appropriate affect, normal speech  Neurologic: extraocular muscles intact, clear speech, moving all extremities with intact sensorium   Discharge Instructions You were  cared for by a hospitalist during your hospital stay. If you have any questions about your discharge medications or the care you received while you were in the hospital after you are discharged, you can call the unit and asked to speak with the hospitalist on call if the hospitalist that took care of you is not available. Once you are discharged, your primary care physician will handle any further medical issues. Please note that NO REFILLS for any  discharge medications will be authorized once you are discharged, as it is imperative that you return to your primary care physician (or establish a relationship with a primary care physician if you do not have one) for your aftercare needs so that they can reassess your need for medications and monitor your lab values.   Allergies as of 07/08/2022       Reactions   Solifenacin Other (See Comments)   Hallucinations (per uro)        Medication List     STOP taking these medications    cycloSPORINE 0.05 % ophthalmic emulsion Commonly known as: RESTASIS   Fiber Complete Tabs       TAKE these medications    aspirin EC 81 MG tablet Take 81 mg by mouth daily. Swallow whole.   bimatoprost 0.01 % Soln Commonly known as: LUMIGAN Place 1 drop into both eyes at bedtime.   carbidopa-levodopa 25-100 MG tablet Commonly known as: SINEMET IR Take 2 tablets by mouth 4 (four) times daily.   cholecalciferol 1000 units tablet Commonly known as: VITAMIN D Take 1,000 Units by mouth daily.   gabapentin 100 MG capsule Commonly known as: NEURONTIN Take 100 mg by mouth at bedtime.   hydrochlorothiazide 12.5 MG tablet Commonly known as: HYDRODIURIL Take 0.5 tablets (6.25 mg total) by mouth daily.   lisinopril 10 MG tablet Commonly known as: ZESTRIL Take 1 tablet (10 mg total) by mouth daily. What changed:  medication strength how much to take   MULTIVITAMIN ADULT PO Take 1 tablet by mouth daily.   polyethylene glycol powder 17 GM/SCOOP powder Commonly known as: GLYCOLAX/MIRALAX Take 17 g by mouth daily as needed for moderate constipation.   sennosides-docusate sodium 8.6-50 MG tablet Commonly known as: SENOKOT-S Take 1 tablet by mouth daily.   timolol 0.5 % ophthalmic solution Commonly known as: TIMOPTIC Place 1 drop into both eyes daily.   VITAMIN B 12 PO Take by mouth daily.       Allergies  Allergen Reactions   Solifenacin Other (See Comments)    Hallucinations  (per uro)    Follow-up Information     Ria Bush, MD Follow up in 2 week(s).   Specialty: Family Medicine Contact information: [place] [address] [phone]         Vladimir Crofts, MD. Call.   Specialty: Neurology Contact information: [place] [facility] [place] [address] [phone]                 The results of significant diagnostics from this hospitalization (including imaging, microbiology, ancillary and laboratory) are listed below for reference.    Significant Diagnostic Studies: EEG adult  Result Date: July 18, 2022 Derek Jack, MD     2022/07/18  6:48 PM Routine EEG Report Jong Dorrien Grunder is a 88 y.o. male with a history of syncope who is undergoing an EEG to evaluate for seizures. Report: This EEG was acquired with electrodes placed according to the International 10-20 electrode system (  including Fp1, Fp2, F3, F4, C3, C4, P3, P4, O1, O2, T3, T4, T5, T6, A1, A2, Fz, Cz, Pz). The following electrodes were missing or displaced: none. The occipital dominant rhythm was 5-6 Hz. This activity is reactive to stimulation. Drowsiness was manifested by background fragmentation; deeper stages of sleep were identified by K complexes and sleep spindles. There was no focal slowing. There were no interictal epileptiform discharges. There were no electrographic seizures identified. Photic stimulation and hyperventilation were not performed. Impression and clinical correlation: This EEG was obtained while awake and asleep and is abnormal due to moderate diffuse slowing indicative of global cerebral dysfunction. Epileptiform abnormalities were not seen during this recording. [name], MD [facility] [phone] If 7pm- 7am, please page neurology on call as listed in [place].   CT PELVIS WO CONTRAST  Result Date: 07/07/2022 CLINICAL DATA:  Pelvic fracture. EXAM: CT PELVIS WITHOUT CONTRAST  TECHNIQUE: Multidetector CT imaging of the pelvis was performed following the standard protocol without intravenous contrast. RADIATION DOSE REDUCTION: This exam was performed according to the departmental dose-optimization program which includes automated exposure control, adjustment of the mA and/or kV according to patient size and/or use of iterative reconstruction technique. COMPARISON:  July 06, 2022. FINDINGS: Urinary Tract: No definite abnormality seen involving the visualized ureters or bladder. Bowel:  Unremarkable visualized pelvic bowel loops. Vascular/Lymphatic: No pathologically enlarged lymph nodes. No significant vascular abnormality seen. Reproductive:  Prostate is unremarkable. Other: Small fat containing left inguinal hernia. No ascites is noted. Musculoskeletal: No fracture is noted. IMPRESSION: No definite pelvic fracture is noted. Small fat containing left inguinal hernia. Electronically Signed   By: Marijo Conception M.D.   On: 07/07/2022 12:52   US Venous Img Lower Bilateral (DVT)  Result Date: 07/07/2022 CLINICAL DATA:  Pulmonary embolism.  Evaluate for DVT. EXAM: BILATERAL LOWER EXTREMITY VENOUS DOPPLER ULTRASOUND TECHNIQUE: Gray-scale sonography with graded compression, as well as color Doppler and duplex ultrasound were performed to evaluate the lower extremity deep venous systems from the level of the common femoral vein and including the common femoral, femoral, profunda femoral, popliteal and calf veins including the posterior tibial, peroneal and gastrocnemius veins when visible. The superficial great saphenous vein was also interrogated. Spectral Doppler was utilized to evaluate flow at rest and with distal augmentation maneuvers in the common femoral, femoral and popliteal veins. COMPARISON:  None Available. FINDINGS: RIGHT LOWER EXTREMITY Common Femoral Vein: No evidence of thrombus. Normal compressibility, respiratory phasicity and response to augmentation. Saphenofemoral  Junction: No evidence of thrombus. Normal compressibility and flow on color Doppler imaging. Profunda Femoral Vein: No evidence of thrombus. Normal compressibility and flow on color Doppler imaging. Femoral Vein: No evidence of thrombus. Normal compressibility, respiratory phasicity and response to augmentation. Popliteal Vein: No evidence of thrombus. Normal compressibility, respiratory phasicity and response to augmentation. Calf Veins: No evidence of thrombus. Normal compressibility and flow on color Doppler imaging. Superficial Great Saphenous Vein: No evidence of thrombus. Normal compressibility. Other Findings:  None. LEFT LOWER EXTREMITY Common Femoral Vein: No evidence of thrombus. Normal compressibility, respiratory phasicity and response to augmentation. Saphenofemoral Junction: No evidence of thrombus. Normal compressibility and flow on color Doppler imaging. Profunda Femoral Vein: No evidence of thrombus. Normal compressibility and flow on color Doppler imaging. Femoral Vein: No evidence of thrombus. Normal compressibility, respiratory phasicity and response to augmentation. Popliteal Vein: No evidence of thrombus. Normal compressibility, respiratory phasicity and response to augmentation. Calf Veins: No evidence of thrombus. Normal compressibility and flow on color Doppler imaging.  Superficial Great Saphenous Vein: No evidence of thrombus. Normal compressibility. Other Findings:  None. IMPRESSION: No evidence of DVT within either lower extremity. Electronically Signed   By: Sandi Mariscal M.D.   On: 07/07/2022 12:34   CT Angio Chest Pulmonary Embolism (PE) W or WO Contrast  Result Date: 07/07/2022 CLINICAL DATA:  Fall, elevated D-dimer, evaluate for PE EXAM: CT ANGIOGRAPHY CHEST WITH CONTRAST TECHNIQUE: Multidetector CT imaging of the chest was performed using the standard protocol during bolus administration of intravenous contrast. Multiplanar CT image reconstructions and MIPs were obtained to  evaluate the vascular anatomy. RADIATION DOSE REDUCTION: This exam was performed according to the departmental dose-optimization program which includes automated exposure control, adjustment of the mA and/or kV according to patient size and/or use of iterative reconstruction technique. CONTRAST:  46m OMNIPAQUE IOHEXOL 350 MG/ML SOLN COMPARISON:  Chest radiograph dated 05/04/2022 FINDINGS: Cardiovascular: Satisfactory opacification of the bilateral pulmonary arteries to the segmental level. Evaluation is constrained due to motion degradation. Within that constraint, there is isolated segmental pulmonary embolism in the right lower lobe pulmonary artery (series 5/image 164). Overall clot burden is very small. Although not tailored for evaluation of the thoracic aorta, there is no evidence thoracic aortic aneurysm or dissection. Mild atherosclerotic calcifications of the arch. Severe three-coronary atherosclerosis, lad predominant. Mediastinum/Nodes: 1.7 cm short axis right paratracheal node (series 5/image 78). Visualized thyroid is unremarkable. Lungs/Pleura: Evaluation lung parenchyma is constrained by respiratory motion. Within that constraint, there are no suspicious pulmonary nodules. No focal consolidation. No pleural effusion or pneumothorax. Upper Abdomen: Visualized upper abdomen is notable for cholelithiasis, vascular calcifications, and a central hepatic cyst. Musculoskeletal: Degenerative changes of the visualized thoracolumbar spine. Review of the MIP images confirms the above findings. IMPRESSION: Isolated segmental pulmonary embolism in the right lower lobe pulmonary artery. Overall clot burden is very small. 1.7 cm short axis right paratracheal node, nonspecific. Consider follow-up CT chest in 3 months. These results will be called to the ordering clinician or representative by the Radiologist Assistant, and communication documented in the PACS or [employer] Aortic Atherosclerosis  (ICD10-I70.0). Electronically Signed   By: SJulian HyM.D.   On: 07/07/2022 03:26   MR BRAIN WO CONTRAST  Result Date: 07/06/2022 CLINICAL DATA:  Altered mental status EXAM: MRI HEAD WITHOUT CONTRAST TECHNIQUE: Multiplanar, multiecho pulse sequences of the brain and surrounding structures were obtained without intravenous contrast. COMPARISON:  06/15/2020 FINDINGS: Brain: No acute infarct, mass effect or extra-axial collection. Fewer than 5 scattered microhemorrhages in a nonspecific pattern. There is multifocal hyperintense T2-weighted signal within the white matter. Generalized volume loss. 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 The midline structures are normal. Vascular: Normal flow voids. Skull and upper cervical spine: Normal marrow signal. Sinuses/Orbits: Negative. Other: None. IMPRESSION: 1. No acute intracranial abnormality. 2. 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 Electronically Signed   By: KUlyses JarredM.D.   On: 07/06/2022 23:16   DG Hip Unilat W or Wo Pelvis 2-3 Views Right  Result Date: 07/06/2022 CLINICAL DATA:  right leg pain, fall EXAM: DG HIP (WITH OR WITHOUT PELVIS) 2-3V RIGHT COMPARISON:  None Available. FINDINGS: There is no evidence of acute right hip fracture. There is a possible fracture of the right sacral ilia. 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 Degenerative changes of the right SI joint and the pubic symphysis. IMPRESSION: Possible fracture of the right sacral ala. Consider CT of the pelvis. No evidence of acute right hip  fracture. Electronically Signed   By: Maurine Simmering M.D.   On: 07/06/2022 18:02   CT Head Wo Contrast  Result Date: 07/06/2022 CLINICAL DATA:  Head trauma, minor. Four falls since yesterday. Neck trauma. EXAM: CT HEAD WITHOUT CONTRAST CT CERVICAL SPINE WITHOUT CONTRAST TECHNIQUE: Multidetector CT imaging of the head and cervical spine was performed following the  standard protocol without intravenous contrast. Multiplanar CT image reconstructions of the cervical spine were also generated. RADIATION DOSE REDUCTION: This exam was performed according to the departmental dose-optimization program which includes automated exposure control, adjustment of the mA and/or kV according to patient size and/or use of iterative reconstruction technique. COMPARISON:  CT head and cervical spine 05/03/2022. FINDINGS: CT HEAD FINDINGS Brain: No acute hemorrhage. Unchanged chronic small-vessel disease. 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 Old lacunar infarct in the right cerebellar hemisphere. Cortical gray-white differentiation is otherwise preserved. Prominence of the ventricles and sulci within normal limits for age. No extra-axial collection. Basilar cisterns are patent. Vascular: No hyperdense vessel or unexpected calcification. Skull: No calvarial fracture or suspicious bone lesion. Skull base is unremarkable. Sinuses/Orbits: Paranasal sinuses, mastoid air cells, and middle ear cavities are well aerated. Orbits are unremarkable. Other: No scalp hematoma. CT CERVICAL SPINE FINDINGS Alignment: 2 mm anterolisthesis of C4 on C5. Degenerative reversal of the normal cervical lordosis. Skull base and vertebrae: No acute fracture. Normal craniocervical junction. No suspicious bone lesions. Soft tissues and spinal canal: No prevertebral fluid or swelling. No visible canal hematoma. Disc levels:  No high-grade spinal canal stenosis. Upper chest: Unremarkable. Other: None. IMPRESSION: 1. No acute intracranial abnormality. 2. No acute cervical spine fracture or traumatic malalignment. Electronically Signed   By: Emmit Alexanders M.D.   On: 07/06/2022 15:25   CT Cervical Spine Wo Contrast  Result Date: 07/06/2022 CLINICAL DATA:  Head trauma, minor. Four falls since yesterday. Neck trauma. EXAM: CT HEAD WITHOUT CONTRAST CT CERVICAL SPINE WITHOUT CONTRAST TECHNIQUE: Multidetector  CT imaging of the head and cervical spine was performed following the standard protocol without intravenous contrast. Multiplanar CT image reconstructions of the cervical spine were also generated. RADIATION DOSE REDUCTION: This exam was performed according to the departmental dose-optimization program which includes automated exposure control, adjustment of the mA and/or kV according to patient size and/or use of iterative reconstruction technique. COMPARISON:  CT head and cervical spine 05/03/2022. FINDINGS: CT HEAD FINDINGS Brain: No acute hemorrhage. Unchanged chronic small-vessel disease. 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 Old lacunar infarct in the right cerebellar hemisphere. Cortical gray-white differentiation is otherwise preserved. Prominence of the ventricles and sulci within normal limits for age. No extra-axial collection. Basilar cisterns are patent. Vascular: No hyperdense vessel or unexpected calcification. Skull: No calvarial fracture or suspicious bone lesion. Skull base is unremarkable. Sinuses/Orbits: Paranasal sinuses, mastoid air cells, and middle ear cavities are well aerated. Orbits are unremarkable. Other: No scalp hematoma. CT CERVICAL SPINE FINDINGS Alignment: 2 mm anterolisthesis of C4 on C5. Degenerative reversal of the normal cervical lordosis. Skull base and vertebrae: No acute fracture. Normal craniocervical junction. No suspicious bone lesions. Soft tissues and spinal canal: No prevertebral fluid or swelling. No visible canal hematoma. Disc levels:  No high-grade spinal canal stenosis. Upper chest: Unremarkable. Other: None. IMPRESSION: 1. No acute intracranial abnormality. 2. No acute cervical spine fracture or traumatic malalignment. Electronically Signed   By: Emmit Alexanders M.D.   On: 07/06/2022 15:25    Microbiology: Recent Results (from the past 240 hour(s))  Resp panel by RT-PCR (RSV, Flu A&B, Covid) Anterior Nasal Swab     Status: None   Collection  Time: 07/06/22  2:38 PM   Specimen: Anterior Nasal Swab  Result Value Ref Range Status   SARS Coronavirus 2 by RT PCR NEGATIVE NEGATIVE Final    Comment: (NOTE) SARS-CoV-2 target nucleic acids are NOT DETECTED.  The SARS-CoV-2 RNA is generally detectable in upper respiratory specimens during the acute phase of infection. The lowest concentration of SARS-CoV-2 viral copies this assay can detect is 138 copies/mL. A negative result does not preclude SARS-Cov-2 infection and should not be used as the sole basis for treatment or other patient management decisions. A negative result may occur with  improper specimen collection/handling, submission of specimen other than nasopharyngeal swab, presence of viral mutation(s) within the areas targeted by this assay, and inadequate number of viral copies(<138 copies/mL). A negative result must be combined with clinical observations, patient history, and epidemiological information. The expected result is Negative.  Fact Sheet for Patients:  EntrepreneurPulse.com.au  Fact Sheet for Healthcare Providers:  IncredibleEmployment.be  This test is no t yet approved or cleared by the [place] FDA and  has been authorized for detection and/or diagnosis of SARS-CoV-2 by FDA under an Emergency Use Authorization (EUA). This EUA will remain  in effect (meaning this test can be used) for the duration of the COVID-19 declaration under Section 564(b)(1) of the Act, 21 U.S.C.section 360bbb-3(b)(1), unless the authorization is terminated  or revoked sooner.       Influenza A by PCR NEGATIVE NEGATIVE Final   Influenza B by PCR NEGATIVE NEGATIVE Final    Comment: (NOTE) The Xpert Xpress SARS-CoV-2/FLU/RSV plus assay is intended as an aid in the diagnosis of influenza from Nasopharyngeal swab specimens and should not be used as a sole basis for treatment. Nasal washings and aspirates are unacceptable for Xpert Xpress  SARS-CoV-2/FLU/RSV testing.  Fact Sheet for Patients: EntrepreneurPulse.com.au  Fact Sheet for Healthcare Providers: IncredibleEmployment.be  This test is not yet approved or cleared by the [place] FDA and has been authorized for detection and/or diagnosis of SARS-CoV-2 by FDA under an Emergency Use Authorization (EUA). This EUA will remain in effect (meaning this test can be used) for the duration of the COVID-19 declaration under Section 564(b)(1) of the Act, 21 U.S.C. section 360bbb-3(b)(1), unless the authorization is terminated or revoked.     Resp Syncytial Virus by PCR NEGATIVE NEGATIVE Final    Comment: (NOTE) Fact Sheet for Patients: EntrepreneurPulse.com.au  Fact Sheet for Healthcare Providers: IncredibleEmployment.be  This test is not yet approved or cleared by the [place] FDA and has been authorized for detection and/or diagnosis of SARS-CoV-2 by FDA under an Emergency Use Authorization (EUA). This EUA will remain in effect (meaning this test can be used) for the duration of the COVID-19 declaration under Section 564(b)(1) of the Act, 21 U.S.C. section 360bbb-3(b)(1), unless the authorization is terminated or revoked.  Performed at [facility], [address] [place], [place] [address]      Labs: Basic Metabolic Panel: Recent Labs  Lab 07/06/22 1350 07/08/22 0459  NA 141 143  K 3.6 3.4*  CL 106 108  CO2 25 28  GLUCOSE 113* 119*  BUN 19 13  CREATININE 0.91 0.83  CALCIUM 9.9 9.9   Liver Function Tests: Recent Labs  Lab 07/06/22 1350  AST 23  ALT 5  ALKPHOS 43  BILITOT 1.1  PROT 6.5  ALBUMIN 3.7   No results  for input(s): "LIPASE", "AMYLASE" in the last 168 hours. No results for input(s): "AMMONIA" in the last 168 hours. CBC: Recent Labs  Lab 07/06/22 1350 07/08/22 0459  WBC 8.1 6.3  NEUTROABS 5.0  --   HGB 14.6 14.9  HCT 44.1 43.5  MCV  90.6 87.9  PLT 172 172   Cardiac Enzymes: No results for input(s): "CKTOTAL", "CKMB", "CKMBINDEX", "TROPONINI" in the last 168 hours. BNP: BNP (last 3 results) No results for input(s): "BNP" in the last 8760 hours.  ProBNP (last 3 results) No results for input(s): "PROBNP" in the last 8760 hours.  CBG: Recent Labs  Lab 07/08/22 0835  GLUCAP 101*   Time spent: > 30 minutes were spent in preparing this discharge including medication reconciliation, counseling, and coordination of care.  Signed:  Jamison Soward Neva Seat, MD  [facility] 07/08/2022, 11:42 AM

## 2022-07-08 NOTE — Progress Notes (Signed)
  Echocardiogram 2D Echocardiogram has been performed.  Joshua Everett 07/08/2022, 12:04 PM

## 2022-07-09 DIAGNOSIS — G20A1 Parkinson's disease without dyskinesia, without mention of fluctuations: Secondary | ICD-10-CM | POA: Diagnosis not present

## 2022-07-09 LAB — CBC
HCT: 42.4 % (ref 39.0–52.0)
Hemoglobin: 14.3 g/dL (ref 13.0–17.0)
MCH: 30 pg (ref 26.0–34.0)
MCHC: 33.7 g/dL (ref 30.0–36.0)
MCV: 88.9 fL (ref 80.0–100.0)
Platelets: 182 10*3/uL (ref 150–400)
RBC: 4.77 MIL/uL (ref 4.22–5.81)
RDW: 13.2 % (ref 11.5–15.5)
WBC: 7.2 10*3/uL (ref 4.0–10.5)
nRBC: 0 % (ref 0.0–0.2)

## 2022-07-09 LAB — BASIC METABOLIC PANEL
Anion gap: 5 (ref 5–15)
BUN: 17 mg/dL (ref 8–23)
CO2: 26 mmol/L (ref 22–32)
Calcium: 9.6 mg/dL (ref 8.9–10.3)
Chloride: 109 mmol/L (ref 98–111)
Creatinine, Ser: 0.74 mg/dL (ref 0.61–1.24)
GFR, Estimated: >60 mL/min (ref >60)
Glucose, Bld: 115 mg/dL — ABNORMAL HIGH (ref 70–99)
Potassium: 3.5 mmol/L (ref 3.5–5.1)
Sodium: 140 mmol/L (ref 135–145)

## 2022-07-09 MED ORDER — ASPIRIN 81 MG PO CHEW
81.0000 mg | CHEWABLE_TABLET | Freq: Every day | ORAL | Status: DC
  Administered 2022-07-09 – 2022-07-14 (×6): 81 mg via ORAL
  Filled 2022-07-09 (×6): qty 1

## 2022-07-09 MED ORDER — LISINOPRIL 10 MG PO TABS: 10.0000 mg | ORAL_TABLET | Freq: Two times a day (BID) | ORAL | 0 refills | Status: DC

## 2022-07-09 MED ORDER — LORAZEPAM 2 MG/ML IJ SOLN
1.0000 mg | INTRAMUSCULAR | Status: DC | PRN
  Administered 2022-07-09 – 2022-07-10 (×2): 1 mg via INTRAVENOUS
  Filled 2022-07-09 (×2): qty 1

## 2022-07-09 NOTE — Progress Notes (Signed)
Physical Therapy Treatment Patient Details Name: Joshua Everett MRN: [id number] DOB: February 08, 1935 Today's Date: 07/09/2022   History of Present Illness Joshua Everett is an 65yoM who comes to [facility] on 07/06/22 after several falls at home. Wife is concerned that she can no longer meet the patient's care needs at home. PMH: Parkinson's disease, dementia. 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 Hip xray report says "Possible fracture of the right sacral ala. Subsequent pelvis CT negative for acute changes. " Chest CT revealing of acute PE RLL 'overall clot burden is very small.'    PT Comments    Author return for pre DC reassessment, caregiver concerned about pt deficits, initial assessment restricted by presence of acute PE and elevated pressures into 200s. Pt awake, flat, delayed but interactive. Due to baseline HOH/dementia/cognitive impairment, does well with simplified verbal cues supported by gestural cues. Wife educated extensively on several points of caregiver education which will eventually improve her ability to provide assistance, disappointing these have not been more extensively address by [facility] services over past several months.   Caregiver education covers: simplified verbal cuing, use of gestural cues, minimization of AMB time, need for safety sitting overnight and/or bed alarm at home due to wandering, increased use of WC in home for functional tasks (wife unable to assist with transfers from [facility]), modified techniques and safety for bed mobility and transfers.   Author's primary safety concerns: heavy physical assist needs for transfer, cognitive dissociation from task, impaired situational safety awareness, impaired ability to verbalize symptoms during activity, unsafe/complicated setup for transition between assistive devices at home, limited capacity for memory recall for safe use of DME.   Although wife (primary caregiver)  has been able to keep patient in the home through heavy caregiver assistance and use of DME/HH services, I think the patient has progressed beyond the services and skills currently in place. Wife has concerns that she is unable to assist pt off the floor if he falls again, however sustaining a fall to floor is the real risk posed and prevention of adverse events such as this should be prioritized over any secondary needs for improving competency in floor to standing transfers. Pt has acute weakness and acute progression of imbalance that is now creating unsafe home conditions for both pt and caregiver. A STR stay would allow pt recover some strength and balance back to baseline, wherein he would be more appropriate for safe albeit brief return back to home and/or memory care/ALF as is planned by wife. Pt's cognitive impairment does not preclude him from being a good rehab candidate, nor does it preclude his participation, however it does warrant a second set of hands for safety and successful execution given the degree of hand-over-hand cuing needed.     Recommendations for follow up therapy are one component of a multi-disciplinary discharge planning process, led by the attending physician.  Recommendations may be updated based on patient status, additional functional criteria and insurance authorization.  Follow Up Recommendations  Skilled nursing-short term rehab (<3 hours/day) Can patient physically be transported by private vehicle: No   Assistance Recommended at Discharge Frequent or constant Supervision/Assistance  Patient can return home with the following Two people to help with walking and/or transfers;Two people to help with bathing/dressing/bathroom;Assistance with feeding;Assist for transportation;Direct supervision/assist for financial management;Direct supervision/assist for medications management;Assistance with cooking/housework   Equipment Recommendations  Other (comment) (if placement  is not an option, pt would need a  bed alarm, safety sitter overnight, nurse aide during daytime to assist with mobility, WC cushion to facilitate low surface transfers.)    Recommendations for Other Services       Precautions / Restrictions Precautions Precautions: Fall Restrictions Weight Bearing Restrictions: No     Mobility  Bed Mobility Overal bed mobility: Needs Assistance Bed Mobility: Supine to Sit, Sit to Supine     Supine to sit: Min assist, Mod assist Sit to supine: Mod assist   General bed mobility comments: inititate coming to sitting well, but once distracted by lines/leads, becomes disoriented to task and begins transition back to lying down instinctively; authro provides modA to hide lines/leads, scoot for feet to floor.    Transfers Overall transfer level: Needs assistance Equipment used: 1 person hand held assist Transfers: Sit to/from Stand Sit to Stand: Mod assist           General transfer comment: wife demonstrates typical method, BUE offered with heavy posterior lean to pull pt up to standing, neither appears especially balanced with activity. I don't think this is a reasonable option for assist at home .    Ambulation/Gait Ambulation/Gait assistance: Min Web designer (Feet): 60 Feet Assistive device: Standup Rollator Gait Pattern/deviations: Step-to pattern       General Gait Details: segmented, delayed, slow; author provides heavy visual cues and occasional steering corrections to upwalker; pt unable to DC Left grip brake du eot cogntiion.   Stairs             Wheelchair Mobility    Modified Rankin (Stroke Patients Only)       Balance                                            Cognition Arousal/Alertness: Awake/alert Behavior During Therapy: Impulsive, Flat affect (delayed processing, delayed receptive language, imapired problem solving, impaired insight) Overall Cognitive Status: History of  cognitive impairments - at baseline                                          Exercises      General Comments        Pertinent Vitals/Pain Pain Assessment Pain Assessment: No/denies pain    Home Living                          Prior Function            PT Goals (current goals can now be found in the care plan section) Acute Rehab PT Goals Patient Stated Goal: wife has been looking into potential options for increasing care for patient, including moving in to ALF PT Goal Formulation: With patient Time For Goal Achievement: 07/21/22 Potential to Achieve Goals: Fair Progress towards PT goals: Progressing toward goals    Frequency    Min 2X/week      PT Plan Discharge plan needs to be updated    Co-evaluation              AM-PAC PT "6 Clicks" Mobility   Outcome Measure  Help needed turning from your back to your side while in a flat bed without using bedrails?: A Lot Help needed moving from lying on your back to sitting on the side of  a flat bed without using bedrails?: A Lot Help needed moving to and from a bed to a chair (including a wheelchair)?: A Lot Help needed standing up from a chair using your arms (e.g., wheelchair or bedside chair)?: A Lot Help needed to walk in hospital room?: A Lot Help needed climbing 3-5 steps with a railing? : Total 6 Click Score: 11    End of Session Equipment Utilized During Treatment: Gait belt Activity Tolerance: Patient tolerated treatment well;No increased pain;Treatment limited secondary to medical complications (Comment) Patient left: with family/visitor present;in bed;with call bell/phone within reach;with bed alarm set Nurse Communication: Mobility status PT Visit Diagnosis: Unsteadiness on feet (R26.81);Difficulty in walking, not elsewhere classified (R26.2);Repeated falls (R29.6)     Time: 2993-7169 PT Time Calculation (min) (ACUTE ONLY): 61 min  Charges:  $Therapeutic Activity:  23-37 mins $Self Care/Home Management: 23-37                     3:54 PM, 07/09/22 Etta Grandchild, PT, DPT Physical Therapist - [facility]  [phone] ([place])    [place] 07/09/2022, 3:38 PM

## 2022-07-09 NOTE — Discharge Summary (Signed)
Discharge Summary  Joshua Everett TGY:[id number] DOB: 06/02/1935  PCP: Ria Bush, MD  Admit date: 07/06/2022 Discharge date: 07/09/2022  Recommendations for Outpatient Follow-up:  Please follow up with your PCP with CBC and BMP in 1-2 weeks Please follow-up with your neurologist Dr. Manuella Ghazi within the next 2 weeks.  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   Discharge Condition: Stable   Diet recommendation: Diet Orders (From admission, onward)     Start     Ordered   07/07/22 1128  Diet Heart Room service appropriate? Yes; Fluid consistency: Thin  Diet effective now       Question Answer Comment  Room service appropriate? Yes   Fluid consistency: Thin      07/07/22 1127           HPI and Brief Hospital Course:  88 yo male who lives independently with his wife and has a history of HTN, HLD, parkinsons and was admitted for multiple falls at home.  He was seen yesterday morning with his wife at the bedside, today she was not present but I did update her over the phone.  He was started on Heparin gtt overnight for small nonocclusive PE seen on CTA chest.  There was some question on x-ray with possible sacral ala fracture, he had CT scan of the pelvis yesterday, which ruled out an acute fracture.  In the meantime, he also had bilateral lower extremity Dopplers, which ruled out DVT.  I had extensive conversations with the patient's wife yesterday as well as today, discussing the risks and benefits of anticoagulation for this gentleman who has worsening parkinsonism and multiple falls at home.  After discussion, the patient's  wife has elected to forego full anticoagulation at this time, instead we will simply continue aspirin.  Otherwise, the patient has remained stable and asymptomatic, he was seen by PT/OT during this hospital stay and they are recommending home health services.  This was relayed to the patient's wife, and she is agreeable to take him home with home health.  Patient was initially medically ready for discharge home with home health yesterday 2/3, however his blood pressure was elevated.  Lisinopril was adjusted, and the patient was monitored.  His blood pressure is now much improved, he is medically cleared for discharge from the hospital.  I also discussed with physical therapy this morning, the patient is being reevaluated, especially given the patient's wife's concerns about weakness in his legs.  Also discussed with the patient's wife that when he goes home later today presumably with home health, this coming week she will pursue hiring a private caregiver for help with him at home.  She will follow-up closely as an outpatient with his neurologist Dr. Manuella Ghazi, as well as with PCP.  Discharge details, plan of care and follow up instructions were discussed with patient and any available family or care providers. Patient and family are in agreement with discharge from the hospital today and all questions were answered to their satisfaction.  Discharge Exam: BP (!) 140/80 (BP Location: Right Arm)   Pulse (!) 55   Temp 98 F (36.7 C)   Resp 16   Ht 5'  10" (1.778 m)   Wt 95.4 kg   SpO2 97%   BMI 30.18 kg/m  General:  Alert, oriented to self, calm, in no acute distress  Eyes: EOMI, clear sclerea Neck: supple, no masses, trachea mildline  Cardiovascular: RRR, no murmurs or rubs, no peripheral edema  Respiratory: clear to auscultation bilaterally, no wheezes, no crackles  Abdomen: soft, nontender, nondistended, normal bowel tones heard  Skin: dry, no rashes  Musculoskeletal: no joint effusions, normal  range of motion  Psychiatric: appropriate affect, normal speech  Neurologic: extraocular muscles intact, clear speech, moving all extremities with intact sensorium   Discharge Instructions You were cared for by a hospitalist during your hospital stay. If you have any questions about your discharge medications or the care you received while you were in the hospital after you are discharged, you can call the unit and asked to speak with the hospitalist on call if the hospitalist that took care of you is not available. Once you are discharged, your primary care physician will handle any further medical issues. Please note that NO REFILLS for any discharge medications will be authorized once you are discharged, as it is imperative that you return to your primary care physician (or establish a relationship with a primary care physician if you do not have one) for your aftercare needs so that they can reassess your need for medications and monitor your lab values.   Allergies as of 07/09/2022       Reactions   Solifenacin Other (See Comments)   Hallucinations (per uro)        Medication List     STOP taking these medications    cycloSPORINE 0.05 % ophthalmic emulsion Commonly known as: RESTASIS   Fiber Complete Tabs       TAKE these medications    aspirin EC 81 MG tablet Take 81 mg by mouth daily. Swallow whole.   bimatoprost 0.01 % Soln Commonly known as: LUMIGAN Place 1 drop into both eyes at bedtime.   carbidopa-levodopa 25-100 MG tablet Commonly known as: SINEMET IR Take 2 tablets by mouth 4 (four) times daily.   cholecalciferol 1000 units tablet Commonly known as: VITAMIN D Take 1,000 Units by mouth daily.   gabapentin 100 MG capsule Commonly known as: NEURONTIN Take 100 mg by mouth at bedtime.   hydrochlorothiazide 12.5 MG tablet Commonly known as: HYDRODIURIL Take 0.5 tablets (6.25 mg total) by mouth daily.   lisinopril 10 MG tablet Commonly known as:  ZESTRIL Take 1 tablet (10 mg total) by mouth 2 (two) times daily. What changed:  medication strength how much to take when to take this   MULTIVITAMIN ADULT PO Take 1 tablet by mouth daily.   polyethylene glycol powder 17 GM/SCOOP powder Commonly known as: GLYCOLAX/MIRALAX Take 17 g by mouth daily as needed for moderate constipation.   sennosides-docusate sodium 8.6-50 MG tablet Commonly known as: SENOKOT-S Take 1 tablet by mouth daily.   timolol 0.5 % ophthalmic solution Commonly known as: TIMOPTIC Place 1 drop into both eyes daily.   VITAMIN B 12 PO Take by mouth daily.               Durable Medical Equipment  (From admission, onward)           Start     Ordered   07/08/22 1331  For home use only DME 3 n 1  Once        07/08/22 1330  Allergies  Allergen Reactions   Solifenacin Other (See Comments)    Hallucinations (per uro)    Follow-up Information     Ria Bush, MD Follow up in 2 week(s).   Specialty: Family Medicine Contact information: [place] [address] [phone]         Vladimir Crofts, MD. Call.   Specialty: Neurology Contact information: [facility] [place] [address] [phone]                 The results of significant diagnostics from this hospitalization (including imaging, microbiology, ancillary and laboratory) are listed below for reference.    Significant Diagnostic Studies: ECHOCARDIOGRAM COMPLETE  Result Date: 07/08/2022    ECHOCARDIOGRAM REPORT   Patient Name:   CAP MASSI Leikam Date of Exam: 07/08/2022 Medical Rec #:  [id number]          Height:       70.0 in Accession #:    [id number]         Weight:       210.3 lb Date of Birth:  26-Jan-1935           BSA:          2.132 m Patient Age:    19 years           BP:           155/64 mmHg Patient Gender: M                  HR:           52 bpm. Exam Location:  [facility] Procedure:  2D Echo Indications:     Stroke I63.9  History:         Patient has no prior history of Echocardiogram examinations.                  Stroke.  Sonographer:     Wayland Salinas Referring Phys:  [place] Diagnosing Phys: Neoma Laming  Sonographer Comments: Technically difficult study due to poor echo windows, suboptimal parasternal window and suboptimal subcostal window. Image acquisition challenging due to patient body habitus and Image acquisition challenging due to respiratory motion. SSN not visualized IMPRESSIONS  1. Left ventricular ejection fraction, by estimation, is 50 to 55%. The left ventricle has low normal function. The left ventricle has no regional wall motion abnormalities. There is moderate concentric left ventricular hypertrophy. Left ventricular diastolic parameters are consistent with Grade I diastolic dysfunction (impaired relaxation).  2. Right ventricular systolic function is normal. The right ventricular size is normal.  3. Left atrial size was moderately dilated.  4. Right atrial size was mildly dilated.  5. The mitral valve is normal in structure. Trivial mitral valve regurgitation. No evidence of mitral stenosis.  6. The aortic valve is normal in structure. Aortic valve regurgitation is not visualized. Aortic valve sclerosis is present, with no evidence of aortic valve stenosis.  7. The inferior vena cava is normal in size with greater than 50% respiratory variability, suggesting right atrial pressure of 3 mmHg. FINDINGS  Left Ventricle: Left ventricular ejection fraction, by estimation, is 50 to 55%. The left ventricle has low normal function. The left ventricle has no regional wall motion abnormalities. The left ventricular internal cavity size was normal in size. There is moderate concentric left ventricular hypertrophy. Left ventricular diastolic parameters are consistent with Grade I diastolic dysfunction (impaired relaxation). Right Ventricle: The right ventricular size  is  normal. No increase in right ventricular wall thickness. Right ventricular systolic function is normal. Left Atrium: Left atrial size was moderately dilated. Right Atrium: Right atrial size was mildly dilated. Pericardium: There is no evidence of pericardial effusion. Mitral Valve: The mitral valve is normal in structure. Trivial mitral valve regurgitation. No evidence of mitral valve stenosis. Tricuspid Valve: The tricuspid valve is normal in structure. Tricuspid valve regurgitation is trivial. No evidence of tricuspid stenosis. Aortic Valve: The aortic valve is normal in structure. Aortic valve regurgitation is not visualized. Aortic valve sclerosis is present, with no evidence of aortic valve stenosis. Aortic valve peak gradient measures 5.4 mmHg. Pulmonic Valve: The pulmonic valve was normal in structure. Pulmonic valve regurgitation is not visualized. No evidence of pulmonic stenosis. Aorta: The aortic root is normal in size and structure. Venous: The inferior vena cava is normal in size with greater than 50% respiratory variability, suggesting right atrial pressure of 3 mmHg. IAS/Shunts: No atrial level shunt detected by color flow Doppler.  LEFT VENTRICLE PLAX 2D LVIDd:         5.20 cm   Diastology LVIDs:         3.70 cm   LV e' medial:    5.66 cm/s LV PW:         1.30 cm   LV E/e' medial:  13.7 LV IVS:        1.30 cm   LV e' lateral:   6.96 cm/s LVOT diam:     2.30 cm   LV E/e' lateral: 11.1 LVOT Area:     4.15 cm  RIGHT VENTRICLE RV S prime:     16.40 cm/s TAPSE (M-mode): 3.2 cm LEFT ATRIUM             Index        RIGHT ATRIUM           Index LA diam:        4.20 cm 1.97 cm/m   RA Area:     19.60 cm LA Vol (A2C):   53.9 ml 25.28 ml/m  RA Volume:   49.40 ml  23.17 ml/m LA Vol (A4C):   84.6 ml 39.67 ml/m LA Biplane Vol: 66.7 ml 31.28 ml/m  AORTIC VALVE                 PULMONIC VALVE AV Area (Vmax): 3.78 cm     PV Vmax:        1.22 m/s AV Vmax:        116.50 cm/s  PV Peak grad:   6.0 mmHg AV Peak  Grad:   5.4 mmHg     RVOT Peak grad: 2 mmHg LVOT Vmax:      106.00 cm/s  AORTA Ao Root diam: 3.70 cm MITRAL VALVE MV Area (PHT): 3.27 cm    SHUNTS MV Decel Time: 232 msec    Systemic Diam: 2.30 cm MV E velocity: 77.60 cm/s MV A velocity: 89.10 cm/s MV E/A ratio:  0.87 Shaukat Khan Electronically signed by Neoma Laming Signature Date/Time: 07/08/2022/12:29:15 PM    Final    EEG adult  Result Date: 07/07/2022 Derek Jack, MD     07/07/2022  6:48 PM Routine EEG Report Stephan Brennin Durfee is a 88 y.o. male with a history of syncope who is undergoing an EEG to evaluate for seizures. Report: This EEG was acquired with electrodes placed according to the International 10-20 electrode system (including Fp1, Fp2, F3, F4, C3, C4, P3, P4,  O1, O2, T3, T4, T5, T6, A1, A2, Fz, Cz, Pz). The following electrodes were missing or displaced: none. The occipital dominant rhythm was 5-6 Hz. This activity is reactive to stimulation. Drowsiness was manifested by background fragmentation; deeper stages of sleep were identified by K complexes and sleep spindles. There was no focal slowing. There were no interictal epileptiform discharges. There were no electrographic seizures identified. Photic stimulation and hyperventilation were not performed. Impression and clinical correlation: This EEG was obtained while awake and asleep and is abnormal due to moderate diffuse slowing indicative of global cerebral dysfunction. Epileptiform abnormalities were not seen during this recording. [name], MD [facility] [phone] If 7pm- 7am, please page neurology on call as listed in [place].   CT PELVIS WO CONTRAST  Result Date: 07/07/2022 CLINICAL DATA:  Pelvic fracture. EXAM: CT PELVIS WITHOUT CONTRAST TECHNIQUE: Multidetector CT imaging of the pelvis was performed following the standard protocol without intravenous contrast. RADIATION DOSE REDUCTION: This exam was performed according to the departmental dose-optimization program  which includes automated exposure control, adjustment of the mA and/or kV according to patient size and/or use of iterative reconstruction technique. COMPARISON:  July 06, 2022. FINDINGS: Urinary Tract: No definite abnormality seen involving the visualized ureters or bladder. Bowel:  Unremarkable visualized pelvic bowel loops. Vascular/Lymphatic: No pathologically enlarged lymph nodes. No significant vascular abnormality seen. Reproductive:  Prostate is unremarkable. Other: Small fat containing left inguinal hernia. No ascites is noted. Musculoskeletal: No fracture is noted. IMPRESSION: No definite pelvic fracture is noted. Small fat containing left inguinal hernia. Electronically Signed   By: Marijo Conception M.D.   On: 07/07/2022 12:52   US Venous Img Lower Bilateral (DVT)  Result Date: 07/07/2022 CLINICAL DATA:  Pulmonary embolism.  Evaluate for DVT. EXAM: BILATERAL LOWER EXTREMITY VENOUS DOPPLER ULTRASOUND TECHNIQUE: Gray-scale sonography with graded compression, as well as color Doppler and duplex ultrasound were performed to evaluate the lower extremity deep venous systems from the level of the common femoral vein and including the common femoral, femoral, profunda femoral, popliteal and calf veins including the posterior tibial, peroneal and gastrocnemius veins when visible. The superficial great saphenous vein was also interrogated. Spectral Doppler was utilized to evaluate flow at rest and with distal augmentation maneuvers in the common femoral, femoral and popliteal veins. COMPARISON:  None Available. FINDINGS: RIGHT LOWER EXTREMITY Common Femoral Vein: No evidence of thrombus. Normal compressibility, respiratory phasicity and response to augmentation. Saphenofemoral Junction: No evidence of thrombus. Normal compressibility and flow on color Doppler imaging. Profunda Femoral Vein: No evidence of thrombus. Normal compressibility and flow on color Doppler imaging. Femoral Vein: No evidence of thrombus.  Normal compressibility, respiratory phasicity and response to augmentation. Popliteal Vein: No evidence of thrombus. Normal compressibility, respiratory phasicity and response to augmentation. Calf Veins: No evidence of thrombus. Normal compressibility and flow on color Doppler imaging. Superficial Great Saphenous Vein: No evidence of thrombus. Normal compressibility. Other Findings:  None. LEFT LOWER EXTREMITY Common Femoral Vein: No evidence of thrombus. Normal compressibility, respiratory phasicity and response to augmentation. Saphenofemoral Junction: No evidence of thrombus. Normal compressibility and flow on color Doppler imaging. Profunda Femoral Vein: No evidence of thrombus. Normal compressibility and flow on color Doppler imaging. Femoral Vein: No evidence of thrombus. Normal compressibility, respiratory phasicity and response to augmentation. Popliteal Vein: No evidence of thrombus. Normal compressibility, respiratory phasicity and response to augmentation. Calf Veins: No evidence of thrombus. Normal compressibility and flow on color Doppler imaging. Superficial Great Saphenous Vein: No evidence of thrombus. Normal  compressibility. Other Findings:  None. IMPRESSION: No evidence of DVT within either lower extremity. Electronically Signed   By: Sandi Mariscal M.D.   On: 07/07/2022 12:34   CT Angio Chest Pulmonary Embolism (PE) W or WO Contrast  Result Date: 07/07/2022 CLINICAL DATA:  Fall, elevated D-dimer, evaluate for PE EXAM: CT ANGIOGRAPHY CHEST WITH CONTRAST TECHNIQUE: Multidetector CT imaging of the chest was performed using the standard protocol during bolus administration of intravenous contrast. Multiplanar CT image reconstructions and MIPs were obtained to evaluate the vascular anatomy. RADIATION DOSE REDUCTION: This exam was performed according to the departmental dose-optimization program which includes automated exposure control, adjustment of the mA and/or kV according to patient size and/or  use of iterative reconstruction technique. CONTRAST:  35m OMNIPAQUE IOHEXOL 350 MG/ML SOLN COMPARISON:  Chest radiograph dated 05/04/2022 FINDINGS: Cardiovascular: Satisfactory opacification of the bilateral pulmonary arteries to the segmental level. Evaluation is constrained due to motion degradation. Within that constraint, there is isolated segmental pulmonary embolism in the right lower lobe pulmonary artery (series 5/image 164). Overall clot burden is very small. Although not tailored for evaluation of the thoracic aorta, there is no evidence thoracic aortic aneurysm or dissection. Mild atherosclerotic calcifications of the arch. Severe three-coronary atherosclerosis, lad predominant. Mediastinum/Nodes: 1.7 cm short axis right paratracheal node (series 5/image 78). Visualized thyroid is unremarkable. Lungs/Pleura: Evaluation lung parenchyma is constrained by respiratory motion. Within that constraint, there are no suspicious pulmonary nodules. No focal consolidation. No pleural effusion or pneumothorax. Upper Abdomen: Visualized upper abdomen is notable for cholelithiasis, vascular calcifications, and a central hepatic cyst. Musculoskeletal: Degenerative changes of the visualized thoracolumbar spine. Review of the MIP images confirms the above findings. IMPRESSION: Isolated segmental pulmonary embolism in the right lower lobe pulmonary artery. Overall clot burden is very small. 1.7 cm short axis right paratracheal node, nonspecific. Consider follow-up CT chest in 3 months. These results will be called to the ordering clinician or representative by the Radiologist Assistant, and communication documented in the PACS or [employer] Aortic Atherosclerosis (ICD10-I70.0). Electronically Signed   By: SJulian HyM.D.   On: 07/07/2022 03:26   MR BRAIN WO CONTRAST  Result Date: 07/06/2022 CLINICAL DATA:  Altered mental status EXAM: MRI HEAD WITHOUT CONTRAST TECHNIQUE: Multiplanar, multiecho pulse  sequences of the brain and surrounding structures were obtained without intravenous contrast. COMPARISON:  06/15/2020 FINDINGS: Brain: No acute infarct, mass effect or extra-axial collection. Fewer than 5 scattered microhemorrhages in a nonspecific pattern. There is multifocal hyperintense T2-weighted signal within the white matter. Generalized volume loss. 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 The midline structures are normal. Vascular: Normal flow voids. Skull and upper cervical spine: Normal marrow signal. Sinuses/Orbits: Negative. Other: None. IMPRESSION: 1. No acute intracranial abnormality. 2. 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 Electronically Signed   By: KUlyses JarredM.D.   On: 07/06/2022 23:16   DG Hip Unilat W or Wo Pelvis 2-3 Views Right  Result Date: 07/06/2022 CLINICAL DATA:  right leg pain, fall EXAM: DG HIP (WITH OR WITHOUT PELVIS) 2-3V RIGHT COMPARISON:  None Available. FINDINGS: There is no evidence of acute right hip fracture. There is a possible fracture of the right sacral ilia. 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 Degenerative changes of the right SI joint and the pubic symphysis. IMPRESSION: Possible fracture of the right sacral ala. Consider CT of the pelvis. No evidence of acute right hip fracture. Electronically Signed   By: JMaurine Simmering  M.D.   On: 07/06/2022 18:02   CT Head Wo Contrast  Result Date: 07/06/2022 CLINICAL DATA:  Head trauma, minor. Four falls since yesterday. Neck trauma. EXAM: CT HEAD WITHOUT CONTRAST CT CERVICAL SPINE WITHOUT CONTRAST TECHNIQUE: Multidetector CT imaging of the head and cervical spine was performed following the standard protocol without intravenous contrast. Multiplanar CT image reconstructions of the cervical spine were also generated. RADIATION DOSE REDUCTION: This exam was performed according to the departmental dose-optimization program which includes  automated exposure control, adjustment of the mA and/or kV according to patient size and/or use of iterative reconstruction technique. COMPARISON:  CT head and cervical spine 05/03/2022. FINDINGS: CT HEAD FINDINGS Brain: No acute hemorrhage. Unchanged chronic small-vessel disease. 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 Old lacunar infarct in the right cerebellar hemisphere. Cortical gray-white differentiation is otherwise preserved. Prominence of the ventricles and sulci within normal limits for age. No extra-axial collection. Basilar cisterns are patent. Vascular: No hyperdense vessel or unexpected calcification. Skull: No calvarial fracture or suspicious bone lesion. Skull base is unremarkable. Sinuses/Orbits: Paranasal sinuses, mastoid air cells, and middle ear cavities are well aerated. Orbits are unremarkable. Other: No scalp hematoma. CT CERVICAL SPINE FINDINGS Alignment: 2 mm anterolisthesis of C4 on C5. Degenerative reversal of the normal cervical lordosis. Skull base and vertebrae: No acute fracture. Normal craniocervical junction. No suspicious bone lesions. Soft tissues and spinal canal: No prevertebral fluid or swelling. No visible canal hematoma. Disc levels:  No high-grade spinal canal stenosis. Upper chest: Unremarkable. Other: None. IMPRESSION: 1. No acute intracranial abnormality. 2. No acute cervical spine fracture or traumatic malalignment. Electronically Signed   By: Emmit Alexanders M.D.   On: 07/06/2022 15:25   CT Cervical Spine Wo Contrast  Result Date: 07/06/2022 CLINICAL DATA:  Head trauma, minor. Four falls since yesterday. Neck trauma. EXAM: CT HEAD WITHOUT CONTRAST CT CERVICAL SPINE WITHOUT CONTRAST TECHNIQUE: Multidetector CT imaging of the head and cervical spine was performed following the standard protocol without intravenous contrast. Multiplanar CT image reconstructions of the cervical spine were also generated. RADIATION DOSE REDUCTION: This exam was performed  according to the departmental dose-optimization program which includes automated exposure control, adjustment of the mA and/or kV according to patient size and/or use of iterative reconstruction technique. COMPARISON:  CT head and cervical spine 05/03/2022. FINDINGS: CT HEAD FINDINGS Brain: No acute hemorrhage. Unchanged chronic small-vessel disease. 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 Old lacunar infarct in the right cerebellar hemisphere. Cortical gray-white differentiation is otherwise preserved. Prominence of the ventricles and sulci within normal limits for age. No extra-axial collection. Basilar cisterns are patent. Vascular: No hyperdense vessel or unexpected calcification. Skull: No calvarial fracture or suspicious bone lesion. Skull base is unremarkable. Sinuses/Orbits: Paranasal sinuses, mastoid air cells, and middle ear cavities are well aerated. Orbits are unremarkable. Other: No scalp hematoma. CT CERVICAL SPINE FINDINGS Alignment: 2 mm anterolisthesis of C4 on C5. Degenerative reversal of the normal cervical lordosis. Skull base and vertebrae: No acute fracture. Normal craniocervical junction. No suspicious bone lesions. Soft tissues and spinal canal: No prevertebral fluid or swelling. No visible canal hematoma. Disc levels:  No high-grade spinal canal stenosis. Upper chest: Unremarkable. Other: None. IMPRESSION: 1. No acute intracranial abnormality. 2. No acute cervical spine fracture or traumatic malalignment. Electronically Signed   By: Emmit Alexanders M.D.   On: 07/06/2022 15:25    Microbiology: Recent Results (from the past 240 hour(s))  Resp panel by RT-PCR (RSV, Flu A&B, Covid) Anterior  Nasal Swab     Status: None   Collection Time: 07/06/22  2:38 PM   Specimen: Anterior Nasal Swab  Result Value Ref Range Status   SARS Coronavirus 2 by RT PCR NEGATIVE NEGATIVE Final    Comment: (NOTE) SARS-CoV-2 target nucleic acids are NOT DETECTED.  The SARS-CoV-2 RNA is  generally detectable in upper respiratory specimens during the acute phase of infection. The lowest concentration of SARS-CoV-2 viral copies this assay can detect is 138 copies/mL. A negative result does not preclude SARS-Cov-2 infection and should not be used as the sole basis for treatment or other patient management decisions. A negative result may occur with  improper specimen collection/handling, submission of specimen other than nasopharyngeal swab, presence of viral mutation(s) within the areas targeted by this assay, and inadequate number of viral copies(<138 copies/mL). A negative result must be combined with clinical observations, patient history, and epidemiological information. The expected result is Negative.  Fact Sheet for Patients:  EntrepreneurPulse.com.au  Fact Sheet for Healthcare Providers:  IncredibleEmployment.be  This test is no t yet approved or cleared by the [place] FDA and  has been authorized for detection and/or diagnosis of SARS-CoV-2 by FDA under an Emergency Use Authorization (EUA). This EUA will remain  in effect (meaning this test can be used) for the duration of the COVID-19 declaration under Section 564(b)(1) of the Act, 21 U.S.C.section 360bbb-3(b)(1), unless the authorization is terminated  or revoked sooner.       Influenza A by PCR NEGATIVE NEGATIVE Final   Influenza B by PCR NEGATIVE NEGATIVE Final    Comment: (NOTE) The Xpert Xpress SARS-CoV-2/FLU/RSV plus assay is intended as an aid in the diagnosis of influenza from Nasopharyngeal swab specimens and should not be used as a sole basis for treatment. Nasal washings and aspirates are unacceptable for Xpert Xpress SARS-CoV-2/FLU/RSV testing.  Fact Sheet for Patients: EntrepreneurPulse.com.au  Fact Sheet for Healthcare Providers: IncredibleEmployment.be  This test is not yet approved or cleared by the [place] FDA and has been authorized for detection and/or diagnosis of SARS-CoV-2 by FDA under an Emergency Use Authorization (EUA). This EUA will remain in effect (meaning this test can be used) for the duration of the COVID-19 declaration under Section 564(b)(1) of the Act, 21 U.S.C. section 360bbb-3(b)(1), unless the authorization is terminated or revoked.     Resp Syncytial Virus by PCR NEGATIVE NEGATIVE Final    Comment: (NOTE) Fact Sheet for Patients: EntrepreneurPulse.com.au  Fact Sheet for Healthcare Providers: IncredibleEmployment.be  This test is not yet approved or cleared by the [place] FDA and has been authorized for detection and/or diagnosis of SARS-CoV-2 by FDA under an Emergency Use Authorization (EUA). This EUA will remain in effect (meaning this test can be used) for the duration of the COVID-19 declaration under Section 564(b)(1) of the Act, 21 U.S.C. section 360bbb-3(b)(1), unless the authorization is terminated or revoked.  Performed at [facility], [address] [place], [place] [address]      Labs: Basic Metabolic Panel: Recent Labs  Lab 07/06/22 1350 07/08/22 0459 07/09/22 0555  NA 141 143 140  K 3.6 3.4* 3.5  CL 106 108 109  CO2 '25 28 26  '$ GLUCOSE 113* 119* 115*  BUN '19 13 17  '$ CREATININE 0.91 0.83 0.74  CALCIUM 9.9 9.9 9.6    Liver Function Tests: Recent Labs  Lab 07/06/22 1350  AST 23  ALT 5  ALKPHOS 43  BILITOT 1.1  PROT 6.5  ALBUMIN 3.7  No results for input(s): "LIPASE", "AMYLASE" in the last 168 hours. No results for input(s): "AMMONIA" in the last 168 hours. CBC: Recent Labs  Lab 07/06/22 1350 07/08/22 0459 07/09/22 0555  WBC 8.1 6.3 7.2  NEUTROABS 5.0  --   --   HGB 14.6 14.9 14.3  HCT 44.1 43.5 42.4  MCV 90.6 87.9 88.9  PLT 172 172 182    Cardiac Enzymes: No results for input(s): "CKTOTAL", "CKMB", "CKMBINDEX", "TROPONINI" in the last 168 hours. BNP: BNP  (last 3 results) No results for input(s): "BNP" in the last 8760 hours.  ProBNP (last 3 results) No results for input(s): "PROBNP" in the last 8760 hours.  CBG: Recent Labs  Lab 07/08/22 0835  GLUCAP 101*    Time spent: > 30 minutes were spent in preparing this discharge including medication reconciliation, counseling, and coordination of care.  Signed:  Lousie Calico Neva Seat, MD  [facility] 07/09/2022, 2:11 PM

## 2022-07-09 NOTE — Plan of Care (Signed)
Discussed again with PT:  Patient was reevaluated today at my request. They are updating their recommendation to STR.  Case management notified, and discharge canceled for today.

## 2022-07-09 NOTE — Plan of Care (Signed)
  Problem: Ischemic Stroke/TIA Tissue Perfusion: Goal: Complications of ischemic stroke/TIA will be minimized Outcome: Progressing   Problem: Health Behavior/Discharge Planning: Goal: Ability to manage health-related needs will improve Outcome: Progressing Goal: Goals will be collaboratively established with patient/family Outcome: Progressing   Problem: Nutrition: Goal: Risk of aspiration will decrease Outcome: Progressing   Problem: Clinical Measurements: Goal: Ability to maintain clinical measurements within normal limits will improve Outcome: Progressing Goal: Cardiovascular complication will be avoided Outcome: Progressing

## 2022-07-09 NOTE — Plan of Care (Signed)
Disoriented x4,  Hypertensive on shift SBP 160s provider notified, Sinus Loletha Grayer on tele. LS clear and dim on ra.  Heart healthy diet, takes pills crushed with applesauce.  Incot of b/b BM on shist.  Male purewicc in place cyu.  Assist x2 oob with steady walker.  Unsteady gait. No pain or discomfort reported  Wife at bedside, support provided.      Problem: Education: Goal: Knowledge of disease or condition will improve Outcome: Progressing Goal: Knowledge of secondary prevention will improve (MUST DOCUMENT ALL) Outcome: Progressing Goal: Knowledge of patient specific risk factors will improve [name] N/A or DELETE if not current risk factor) Outcome: Progressing   Problem: Ischemic Stroke/TIA Tissue Perfusion: Goal: Complications of ischemic stroke/TIA will be minimized Outcome: Progressing   Problem: Coping: Goal: Will verbalize positive feelings about self Outcome: Progressing Goal: Will identify appropriate support needs Outcome: Progressing   Problem: Health Behavior/Discharge Planning: Goal: Ability to manage health-related needs will improve Outcome: Progressing Goal: Goals will be collaboratively established with patient/family Outcome: Progressing   Problem: Self-Care: Goal: Ability to participate in self-care as condition permits will improve Outcome: Progressing Goal: Verbalization of feelings and concerns over difficulty with self-care will improve Outcome: Progressing Goal: Ability to communicate needs accurately will improve Outcome: Progressing   Problem: Nutrition: Goal: Risk of aspiration will decrease Outcome: Progressing Goal: Dietary intake will improve Outcome: Progressing   Problem: Education: Goal: Knowledge of General Education information will improve Description: Including pain rating scale, medication(s)/side effects and non-pharmacologic comfort measures Outcome: Progressing   Problem: Health Behavior/Discharge Planning: Goal: Ability to manage  health-related needs will improve Outcome: Progressing   Problem: Clinical Measurements: Goal: Ability to maintain clinical measurements within normal limits will improve Outcome: Progressing Goal: Will remain free from infection Outcome: Progressing Goal: Diagnostic test results will improve Outcome: Progressing Goal: Respiratory complications will improve Outcome: Progressing Goal: Cardiovascular complication will be avoided Outcome: Progressing   Problem: Activity: Goal: Risk for activity intolerance will decrease Outcome: Progressing   Problem: Nutrition: Goal: Adequate nutrition will be maintained Outcome: Progressing   Problem: Coping: Goal: Level of anxiety will decrease Outcome: Progressing   Problem: Elimination: Goal: Will not experience complications related to bowel motility Outcome: Progressing Goal: Will not experience complications related to urinary retention Outcome: Progressing   Problem: Pain Managment: Goal: General experience of comfort will improve Outcome: Progressing   Problem: Safety: Goal: Ability to remain free from injury will improve Outcome: Progressing   Problem: Skin Integrity: Goal: Risk for impaired skin integrity will decrease Outcome: Progressing   Problem: Education: Goal: Knowledge of General Education information will improve Description: Including pain rating scale, medication(s)/side effects and non-pharmacologic comfort measures Outcome: Progressing   Problem: Health Behavior/Discharge Planning: Goal: Ability to manage health-related needs will improve Outcome: Progressing   Problem: Clinical Measurements: Goal: Ability to maintain clinical measurements within normal limits will improve Outcome: Progressing Goal: Will remain free from infection Outcome: Progressing Goal: Diagnostic test results will improve Outcome: Progressing Goal: Respiratory complications will improve Outcome: Progressing Goal: Cardiovascular  complication will be avoided Outcome: Progressing   Problem: Activity: Goal: Risk for activity intolerance will decrease Outcome: Progressing   Problem: Nutrition: Goal: Adequate nutrition will be maintained Outcome: Progressing   Problem: Coping: Goal: Level of anxiety will decrease Outcome: Progressing   Problem: Elimination: Goal: Will not experience complications related to bowel motility Outcome: Progressing Goal: Will not experience complications related to urinary retention Outcome: Progressing   Problem: Pain Managment: Goal: General experience of comfort will improve  Outcome: Progressing   Problem: Safety: Goal: Ability to remain free from injury will improve Outcome: Progressing   Problem: Skin Integrity: Goal: Risk for impaired skin integrity will decrease Outcome: Progressing

## 2022-07-09 NOTE — TOC Progression Note (Signed)
Transition of Care [facility]) - Progression Note    Patient Details  Name: Joshua Everett MRN: [id number] Date of Birth: 1934/07/16  [facility]) CM/SW Contact  Izola Price, RN Phone Number: 07/09/2022, 12:22 PM  Clinical Narrative:  2/4: Did not discharge on 2/3 due to blood pressures issues per Unit RN. Simmie Davies RN CM        Barriers to Discharge: Barriers Resolved  Expected Discharge Plan and Services                         DME Arranged: 3-N-1 DME Agency: [employer]       HH Arranged: RN, OT, PT, Nurse's Aide, Social Work [employer] Agency: Other - See comment (Patient active with an agency that she will call RN CM back about when she gets home. DC orders on in for all the listed services.)         Social Determinants of Health (SDOH) Interventions SDOH Screenings   Food Insecurity: No Food Insecurity (07/06/2022)  Housing: Low Risk  (07/06/2022)  Transportation Needs: No Transportation Needs (07/06/2022)  Utilities: Not At Risk (07/06/2022)  Alcohol Screen: Low Risk  (01/27/2022)  Depression (PHQ2-9): Medium Risk (06/19/2022)  Financial Resource Strain: Low Risk  (01/27/2022)  Physical Activity: Insufficiently Active (01/27/2022)  Social Connections: Moderately Isolated (01/27/2022)  Stress: No Stress Concern Present (01/27/2022)  Tobacco Use: Medium Risk (07/06/2022)    Readmission Risk Interventions     No data to display

## 2022-07-10 DIAGNOSIS — I2693 Single subsegmental pulmonary embolism without acute cor pulmonale: Secondary | ICD-10-CM | POA: Diagnosis not present

## 2022-07-10 LAB — CBC
HCT: 42.5 % (ref 39.0–52.0)
Hemoglobin: 14.3 g/dL (ref 13.0–17.0)
MCH: 30.1 pg (ref 26.0–34.0)
MCHC: 33.6 g/dL (ref 30.0–36.0)
MCV: 89.5 fL (ref 80.0–100.0)
Platelets: 181 10*3/uL (ref 150–400)
RBC: 4.75 MIL/uL (ref 4.22–5.81)
RDW: 13.1 % (ref 11.5–15.5)
WBC: 7.1 10*3/uL (ref 4.0–10.5)
nRBC: 0 % (ref 0.0–0.2)

## 2022-07-10 LAB — BASIC METABOLIC PANEL
Anion gap: 8 (ref 5–15)
BUN: 19 mg/dL (ref 8–23)
CO2: 28 mmol/L (ref 22–32)
Calcium: 10 mg/dL (ref 8.9–10.3)
Chloride: 105 mmol/L (ref 98–111)
Creatinine, Ser: 0.84 mg/dL (ref 0.61–1.24)
GFR, Estimated: >60 mL/min (ref >60)
Glucose, Bld: 135 mg/dL — ABNORMAL HIGH (ref 70–99)
Potassium: 3.6 mmol/L (ref 3.5–5.1)
Sodium: 141 mmol/L (ref 135–145)

## 2022-07-10 MED ORDER — ENOXAPARIN SODIUM 60 MG/0.6ML IJ SOSY
0.5000 mg/kg | PREFILLED_SYRINGE | INTRAMUSCULAR | Status: DC
  Administered 2022-07-10 – 2022-07-13 (×4): 47.5 mg via SUBCUTANEOUS
  Filled 2022-07-10 (×4): qty 0.6

## 2022-07-10 NOTE — TOC PASRR Note (Signed)
RE: Joshua Everett Date of Birth: 1934/08/02 Date: 07/10/2022   To Whom It May Concern:  Please be advised that the above-named patient will require a short-term nursing home stay - anticipated 30 days or less for rehabilitation and strengthening.  The plan is for return home.

## 2022-07-10 NOTE — Progress Notes (Signed)
PROGRESS NOTE  Joshua Everett JSE:[id number] DOB: Jun 16, 1934 DOA: 07/06/2022 PCP: Ria Bush, MD  Hospital Course/Subjective: 88 yo male who lives independently with his wife and has a history of HTN, HLD, parkinsons and is being admitted for multiple falls at home.  He was found to have small nonocclusive PE on CTA of the chest, bilateral lower extremity Dopplers negative.  He had been placed on IV heparin drip, after discussion with his wife given his high risk of recurrent falls, it was decided not to continue anticoagulation.  Patient is currently stable, with no acute medical issues, awaiting subacute nursing facility placement.    In this morning with his wife at the bedside, he remained stable all questions answered to her satisfaction TOC is starting bed search.  Assessment/Plan: PE Small, subsegmental. No hypoxia or chest pain. Monitor off anticoagulation. Discussed with wife if he is ambulation becomes more safe, steady and he has no falls, she may consider initiating anticoagulation in the future.  Suspected Sacral Ala Fracture Ruled out on CT pelvis  Fall Multiple falls at home, seen by PT who is recommending subacute nursing facility.  Recurrent falls Will also monitor for orthostatic or syncopal changes or presentation.  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 [facility]) Per chart review pt was on hctz but had orthostatic hypotension and was stopped.  Lisinopril was reduced changed to twice daily and started on low dose HCTZ, will monitor closely.  Parkinson disease Per neurology note patient's Parkinson is worsening and has slowing of movement and even swallowing issues. Will continue his carbidopa levodopa regimen.  Essential hypertension Continue regimen as above with lisinopril 10 mg p.o. twice daily, and low-dose HCTZ  GERD (gastroesophageal reflux disease) IV PPI.  Glaucoma Patient continued on home regimen of latanoprost   DVT  Prophylaxis: Lovenox prophylactic dose  Code Status: FULL   Family Communication: Discussed with patient and wife at the bedside this morning, all questions answered to her satisfaction.  Disposition Plan: Awaiting SNF placement, discussed with TOC this morning as well.  Consultants: None   Procedures: None   Antimicrobials: Anti-infectives (From admission, onward)    None       Objective: Vitals:   07/10/22 0025 07/10/22 0400 07/10/22 0500 07/10/22 0817  BP: (!) 181/61 (!) 172/57 (!) 165/60 (!) 171/73  Pulse:   68 66  Resp:    16  Temp:    97.8 F (36.6 C)  TempSrc:      SpO2:    99%  Weight:      Height:        Intake/Output Summary (Last 24 hours) at 07/10/2022 1100 Last data filed at 07/10/2022 0737 Gross per 24 hour  Intake 118 ml  Output 1150 ml  Net -1032 ml    Filed Weights   07/06/22 1348  Weight: 95.4 kg   Exam: General:  Alert, oriented to self, calm, in no acute distress, wife at the bedside Eyes: EOMI, clear conjuctivae, white sclerea Neck: supple, no masses, trachea mildline  Cardiovascular: RRR, no murmurs or rubs, no peripheral edema  Respiratory: clear to auscultation bilaterally, no wheezes, no crackles  Abdomen: soft, nontender, nondistended, normal bowel tones heard  Skin: dry, no rashes  Musculoskeletal: no joint effusions, normal range of motion  Psychiatric: appropriate affect, normal speech  Neurologic: extraocular muscles intact, clear speech, moving all extremities with intact sensorium  Data Reviewed: CBC: Recent Labs  Lab 07/06/22 1350 07/08/22 0459 07/09/22 0555 07/10/22 0301  WBC 8.1 6.3 7.2  7.1  NEUTROABS 5.0  --   --   --   HGB 14.6 14.9 14.3 14.3  HCT 44.1 43.5 42.4 42.5  MCV 90.6 87.9 88.9 89.5  PLT 172 172 182 654    Basic Metabolic Panel: Recent Labs  Lab 07/06/22 1350 07/08/22 0459 07/09/22 0555 07/10/22 0301  NA 141 143 140 141  K 3.6 3.4* 3.5 3.6  CL 106 108 109 105  CO2 '25 28 26 28  '$ GLUCOSE 113*  119* 115* 135*  BUN '19 13 17 19  '$ CREATININE 0.91 0.83 0.74 0.84  CALCIUM 9.9 9.9 9.6 10.0    GFR: Estimated Creatinine Clearance: 74.6 mL/min (by C-G formula based on SCr of 0.84 mg/dL). Liver Function Tests: Recent Labs  Lab 07/06/22 1350  AST 23  ALT 5  ALKPHOS 43  BILITOT 1.1  PROT 6.5  ALBUMIN 3.7    No results for input(s): "LIPASE", "AMYLASE" in the last 168 hours. No results for input(s): "AMMONIA" in the last 168 hours. Coagulation Profile: No results for input(s): "INR", "PROTIME" in the last 168 hours. Cardiac Enzymes: No results for input(s): "CKTOTAL", "CKMB", "CKMBINDEX", "TROPONINI" in the last 168 hours. BNP (last 3 results) No results for input(s): "PROBNP" in the last 8760 hours. HbA1C: No results for input(s): "HGBA1C" in the last 72 hours. CBG: Recent Labs  Lab 07/08/22 0835  GLUCAP 101*   Lipid Profile: No results for input(s): "CHOL", "HDL", "LDLCALC", "TRIG", "CHOLHDL", "LDLDIRECT" in the last 72 hours.  Thyroid Function Tests: No results for input(s): "TSH", "T4TOTAL", "FREET4", "T3FREE", "THYROIDAB" in the last 72 hours. Anemia Panel: No results for input(s): "VITAMINB12", "FOLATE", "FERRITIN", "TIBC", "IRON", "RETICCTPCT" in the last 72 hours. Urine analysis:    Component Value Date/Time   COLORURINE YELLOW (A) 07/06/2022 1721   APPEARANCEUR CLEAR (A) 07/06/2022 1721   LABSPEC 1.012 07/06/2022 1721   PHURINE 6.0 07/06/2022 1721   GLUCOSEU NEGATIVE 07/06/2022 1721   HGBUR NEGATIVE 07/06/2022 1721   BILIRUBINUR NEGATIVE 07/06/2022 1721   BILIRUBINUR negative 01/06/2020 1000   KETONESUR NEGATIVE 07/06/2022 1721   PROTEINUR NEGATIVE 07/06/2022 1721   UROBILINOGEN 1.0 01/06/2020 1000   NITRITE NEGATIVE 07/06/2022 1721   LEUKOCYTESUR NEGATIVE 07/06/2022 1721   Sepsis Labs: '@LABRCNTIP'$ (procalcitonin:4,lacticidven:4)  ) Recent Results (from the past 240 hour(s))  Resp panel by RT-PCR (RSV, Flu A&B, Covid) Anterior Nasal Swab     Status:  None   Collection Time: 07/06/22  2:38 PM   Specimen: Anterior Nasal Swab  Result Value Ref Range Status   SARS Coronavirus 2 by RT PCR NEGATIVE NEGATIVE Final    Comment: (NOTE) SARS-CoV-2 target nucleic acids are NOT DETECTED.  The SARS-CoV-2 RNA is generally detectable in upper respiratory specimens during the acute phase of infection. The lowest concentration of SARS-CoV-2 viral copies this assay can detect is 138 copies/mL. A negative result does not preclude SARS-Cov-2 infection and should not be used as the sole basis for treatment or other patient management decisions. A negative result may occur with  improper specimen collection/handling, submission of specimen other than nasopharyngeal swab, presence of viral mutation(s) within the areas targeted by this assay, and inadequate number of viral copies(<138 copies/mL). A negative result must be combined with clinical observations, patient history, and epidemiological information. The expected result is Negative.  Fact Sheet for Patients:  EntrepreneurPulse.com.au  Fact Sheet for Healthcare Providers:  IncredibleEmployment.be  This test is no t yet approved or cleared by the [place] FDA and  has been authorized for detection  and/or diagnosis of SARS-CoV-2 by FDA under an Emergency Use Authorization (EUA). This EUA will remain  in effect (meaning this test can be used) for the duration of the COVID-19 declaration under Section 564(b)(1) of the Act, 21 U.S.C.section 360bbb-3(b)(1), unless the authorization is terminated  or revoked sooner.       Influenza A by PCR NEGATIVE NEGATIVE Final   Influenza B by PCR NEGATIVE NEGATIVE Final    Comment: (NOTE) The Xpert Xpress SARS-CoV-2/FLU/RSV plus assay is intended as an aid in the diagnosis of influenza from Nasopharyngeal swab specimens and should not be used as a sole basis for treatment. Nasal washings and aspirates are unacceptable  for Xpert Xpress SARS-CoV-2/FLU/RSV testing.  Fact Sheet for Patients: EntrepreneurPulse.com.au  Fact Sheet for Healthcare Providers: IncredibleEmployment.be  This test is not yet approved or cleared by the [place] FDA and has been authorized for detection and/or diagnosis of SARS-CoV-2 by FDA under an Emergency Use Authorization (EUA). This EUA will remain in effect (meaning this test can be used) for the duration of the COVID-19 declaration under Section 564(b)(1) of the Act, 21 U.S.C. section 360bbb-3(b)(1), unless the authorization is terminated or revoked.     Resp Syncytial Virus by PCR NEGATIVE NEGATIVE Final    Comment: (NOTE) Fact Sheet for Patients: EntrepreneurPulse.com.au  Fact Sheet for Healthcare Providers: IncredibleEmployment.be  This test is not yet approved or cleared by the [place] FDA and has been authorized for detection and/or diagnosis of SARS-CoV-2 by FDA under an Emergency Use Authorization (EUA). This EUA will remain in effect (meaning this test can be used) for the duration of the COVID-19 declaration under Section 564(b)(1) of the Act, 21 U.S.C. section 360bbb-3(b)(1), unless the authorization is terminated or revoked.  Performed at [facility], [address], [place], [place] [address]      Studies: No results found.  Scheduled Meds:  aspirin  81 mg Oral Daily   carbidopa-levodopa  2 tablet Oral QID   hydrochlorothiazide  6.25 mg Oral Daily   latanoprost  1 drop Both Eyes QHS   lisinopril  10 mg Oral BID    Continuous Infusions:    LOS: 4 days   Time spent: 31 minutes  Shena Vinluan Neva Seat, MD [facility] Pager [phone]  If 7PM-7AM, please contact night-coverage [email or web address] Password [email or web address] 07/10/2022, 11:00 AM

## 2022-07-10 NOTE — Care Management Important Message (Signed)
Important Message  Patient Details  Name: Joshua Everett MRN: [id number] Date of Birth: 1935/01/15   Medicare Important Message Given:  Yes     Dannette Barbara 07/10/2022, 1:48 PM

## 2022-07-10 NOTE — Progress Notes (Signed)
Patient is uncooperative, agitated and started grabbing this nurses's arm. Several attempts of getting out of the bed and was unable to be redirected. PRN lorazepam administered, mittens applied after attempt to pull out IV.

## 2022-07-10 NOTE — Progress Notes (Signed)
ANTICOAGULATION CONSULT NOTE - Initial Consult  Pharmacy Consult for Lovenox Indication: VTE prophylaxis  Allergies  Allergen Reactions   Solifenacin Other (See Comments)    Hallucinations (per uro)    Patient Measurements: Height: '5\' 10"'$  (177.8 cm) Weight: 95.4 kg (210 lb 5.1 oz) IBW/kg (Calculated) : 73 Heparin Dosing Weight: 95.4 kg  Vital Signs: Temp: 97.8 F (36.6 C) (02/05 0817) Temp Source: Axillary (02/04 2349) BP: 171/73 (02/05 0817) Pulse Rate: 66 (02/05 0817)  Labs: Recent Labs    07/07/22 1303 07/07/22 2255 07/08/22 0459 07/08/22 0459 07/08/22 0755 07/09/22 0555 07/10/22 0301  HGB  --   --  14.9   < >  --  14.3 14.3  HCT  --   --  43.5  --   --  42.4 42.5  PLT  --   --  172  --   --  182 181  HEPARINUNFRC 0.84* 0.67  --   --  0.68  --   --   CREATININE  --   --  0.83  --   --  0.74 0.84   < > = values in this interval not displayed.    Estimated Creatinine Clearance: 74.6 mL/min (by C-G formula based on SCr of 0.84 mg/dL).   Medical History: Past Medical History:  Diagnosis Date   Actinic keratosis    Basal cell carcinoma 09/03/2014   right of midline forehead   Basal cell carcinoma 03/25/2019   right lower nasal dorsum, EDC   Basal cell carcinoma 03/30/2020   Mid nasal tip, EDC   Basal cell carcinoma (BCC) 01/28/2015   right proximal nasal alar rim   BPH without obstruction/lower urinary tract symptoms 09/08/2014   Chronic constipation 09/08/2014   GERD (gastroesophageal reflux disease)    Glaucoma    History of chicken pox    Hyperlipidemia    Obesity, Class I, BMI 30-34.9 09/08/2014   Prediabetes 07/31/2015   Rosacea 07/06/2015   Squamous cell carcinoma of skin 01/30/2007   mid back, in situ   Squamous cell carcinoma of skin 03/12/2018   right forearm, in situ   Squamous cell carcinoma of skin 04/22/2018   left pretibial   Squamous cell carcinoma of skin 09/24/2018   right hand dorsum, in situ   Urine incontinence     Medications:   Medications Prior to Admission  Medication Sig Dispense Refill Last Dose   aspirin EC 81 MG tablet Take 81 mg by mouth daily. Swallow whole.   07/06/2022   bimatoprost (LUMIGAN) 0.01 % SOLN Place 1 drop into both eyes at bedtime.    07/05/2022   carbidopa-levodopa (SINEMET IR) 25-100 MG tablet Take 2 tablets by mouth 4 (four) times daily.   07/06/2022 at 1000   cholecalciferol (VITAMIN D) 1000 units tablet Take 1,000 Units by mouth daily.    07/06/2022   Cyanocobalamin (VITAMIN B 12 PO) Take by mouth daily.   07/06/2022   gabapentin (NEURONTIN) 100 MG capsule Take 100 mg by mouth at bedtime.   07/05/2022   lisinopril (ZESTRIL) 20 MG tablet Take 1 tablet (20 mg total) by mouth daily. 90 tablet 3 07/06/2022   polyethylene glycol powder (GLYCOLAX/MIRALAX) 17 GM/SCOOP powder Take 17 g by mouth daily as needed for moderate constipation.   07/06/2022   sennosides-docusate sodium (SENOKOT-S) 8.6-50 MG tablet Take 1 tablet by mouth daily.   07/05/2022   timolol (TIMOPTIC) 0.5 % ophthalmic solution Place 1 drop into both eyes daily.   07/06/2022   cycloSPORINE (  RESTASIS) 0.05 % ophthalmic emulsion Place 1 drop into both eyes 2 (two) times daily. (Patient not taking: Reported on 07/06/2022)   Not Taking   Fiber Complete TABS Take 2 tablets by mouth daily. (Patient not taking: Reported on 07/06/2022)   Not Taking   Multiple Vitamins-Minerals (MULTIVITAMIN ADULT PO) Take 1 tablet by mouth daily. (Patient not taking: Reported on 07/06/2022)   Not Taking   Scheduled:   aspirin  81 mg Oral Daily   carbidopa-levodopa  2 tablet Oral QID   enoxaparin (LOVENOX) injection  0.5 mg/kg Subcutaneous Q24H   hydrochlorothiazide  6.25 mg Oral Daily   latanoprost  1 drop Both Eyes QHS   lisinopril  10 mg Oral BID   Infusions:   Assessment: 88 yo male w/ recurrent falls,small nonocclusive PE , parkinsons.Family/attending decided not to continue therapeutic Anticoagulant therapy. -also on aspirin Hgb 14.3  plt 181  Goal of Therapy:   Monitor platelets by anticoagulation protocol: Yes   Plan:  Will order Lovenox 0.5 mg/kg q24h for BMI 30.18 F/u Scr/CBC per protocol  Raylei Losurdo A 07/10/2022,11:11 AM

## 2022-07-10 NOTE — NC FL2 (Signed)
[place] LEVEL OF CARE FORM     IDENTIFICATION  Patient Name: Joshua Everett Birthdate: [birth year] Sex: male Admission Date (Current Location): 07/06/2022  [facility] and [place] Number:  Engineering geologist and Address:  [facility], [address], [place], [place] [address]      Provider Number: [id number]  Attending Physician Name and Address:  Lucillie Garfinkel, MD  Relative Name and Phone Number:       Current Level of Care: Hospital Recommended Level of Care: [place] Prior Approval Number:    Date Approved/Denied:   PASRR Number: Manual review  Discharge Plan: SNF    Current Diagnoses: Patient Active Problem List   Diagnosis Date Noted   Fall 07/06/2022   Cerumen impaction 06/19/2022   Scalp laceration, subsequent encounter 05/11/2022   Recurrent falls 05/11/2022   AMS (altered mental status) 04/08/2022   Syncope 06/22/2020   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[place]) 06/22/2020   Pedal edema 08/29/2019   Medicare annual wellness visit, subsequent 01/13/2019   Parkinson disease 03/12/2018   Abnormal gait 01/04/2018   Overactive bladder 05/21/2017   Chronic pain of right knee 01/01/2017   Chest pain with moderate risk for cardiac etiology 08/17/2016   Advanced care planning/counseling discussion 01/23/2016   Prediabetes 07/31/2015   Rosacea 07/06/2015   Personal history of other malignant neoplasm of skin 11/19/2014   Abdominal wall hernia 09/08/2014   BPH without obstruction/lower urinary tract symptoms 09/08/2014   Chronic constipation 09/08/2014   Dyslipidemia 09/08/2014   Glaucoma 09/08/2014   GERD (gastroesophageal reflux disease) 09/08/2014   Essential hypertension 09/08/2014   Overweight (BMI 25.0-29.9) 09/08/2014   Nocturia 11/13/2012    Orientation RESPIRATION BLADDER Height & Weight     Self  Normal Incontinent, External catheter Weight: 210 lb 5.1 oz (95.4  kg) Height:  '5\' 10"'$  (177.8 cm)  BEHAVIORAL SYMPTOMS/MOOD NEUROLOGICAL BOWEL NUTRITION STATUS   (Restless, some agitation last night (2/4))  (Parkinsons) Continent Diet (Heart healthy)  AMBULATORY STATUS COMMUNICATION OF NEEDS Skin   Limited Assist Verbally Skin abrasions                       Personal Care Assistance Level of Assistance  Bathing, Feeding, Dressing Bathing Assistance: Maximum assistance Feeding assistance: Limited assistance Dressing Assistance: Maximum assistance     Functional Limitations Info  Sight, Hearing, Speech Sight Info: Adequate Hearing Info: Adequate Speech Info: Adequate    SPECIAL CARE FACTORS FREQUENCY  PT (By licensed PT), OT (By licensed OT)     PT Frequency: 5 x week OT Frequency: 5 x week            Contractures Contractures Info: Not present    Additional Factors Info  Code Status, Allergies Code Status Info: Full code Allergies Info: Solifenacin           Current Medications (07/10/2022):  This is the current hospital active medication list Current Facility-Administered Medications  Medication Dose Route Frequency Provider Last Rate Last Admin   aspirin chewable tablet 81 mg  81 mg Oral Daily Hollice Gong, Mir M, MD   81 mg at 07/10/22 1026   carbidopa-levodopa (SINEMET IR) 25-100 MG per tablet immediate release 2 tablet  2 tablet Oral QID Para Skeans, MD   2 tablet at 07/10/22 1026   enoxaparin (LOVENOX) injection 47.5 mg  0.5 mg/kg Subcutaneous Q24H Chinita Greenland A, RPH       hydrALAZINE (APRESOLINE) injection  10 mg  10 mg Intravenous Q4H PRN Para Skeans, MD   10 mg at 07/10/22 0405   hydrochlorothiazide (HYDRODIURIL) tablet 6.25 mg  6.25 mg Oral Daily Florina Ou V, MD   6.25 mg at 07/10/22 1026   latanoprost (XALATAN) 0.005 % ophthalmic solution 1 drop  1 drop Both Eyes QHS Florina Ou V, MD   1 drop at 07/09/22 2103   lisinopril (ZESTRIL) tablet 10 mg  10 mg Oral BID Hollice Gong, Mir M, MD   10 mg at 07/10/22 1026    LORazepam (ATIVAN) injection 1 mg  1 mg Intravenous Q4H PRN Lucillie Garfinkel, MD   1 mg at 07/09/22 2050     Discharge Medications: Please see discharge summary for a list of discharge medications.  Relevant Imaging Results:  Relevant Lab Results:   Additional Information SS#: [id number]  Candie Chroman, LCSW

## 2022-07-10 NOTE — TOC Progression Note (Addendum)
Transition of Care [facility]) - Progression Note    Patient Details  Name: Joshua Everett MRN: [id number] Date of Birth: [birth year]  Transition of Care [facility]) CM/SW [place], LCSW Phone Number: 07/10/2022, 11:19 AM  Clinical Narrative:  Patient only oriented to self. Met with wife at bedside to discuss SNF recommendation and she is agreeable. First preference is [facility]. Left message for admissions coordinator asking her to review. Discussed plan to return home at discharge. Wife is planning on hiring additional assistance. Patient is a veteran so encouraged her to reach out to [place] to see if they can also provide some aide services.   4:20 pm: Uploaded clinicals into [place] Must for PASARR review.    Barriers to Discharge: Barriers Resolved  Expected Discharge Plan and Services         Expected Discharge Date: 07/09/22               DME Arranged: 3-N-1 DME Agency: [employer]       HH Arranged: RN, OT, PT, Nurse's Aide, Social Work [employer] Agency: Other - See comment (Patient active with an agency that she will call RN CM back about when she gets home. DC orders on in for all the listed services.)         Social Determinants of Health (SDOH) Interventions SDOH Screenings   Food Insecurity: No Food Insecurity (07/06/2022)  Housing: Low Risk  (07/06/2022)  Transportation Needs: No Transportation Needs (07/06/2022)  Utilities: Not At Risk (07/06/2022)  Alcohol Screen: Low Risk  (01/27/2022)  Depression (PHQ2-9): Medium Risk (06/19/2022)  Financial Resource Strain: Low Risk  (01/27/2022)  Physical Activity: Insufficiently Active (01/27/2022)  Social Connections: Moderately Isolated (01/27/2022)  Stress: No Stress Concern Present (01/27/2022)  Tobacco Use: Medium Risk (07/06/2022)    Readmission Risk Interventions     No data to display

## 2022-07-10 NOTE — Plan of Care (Signed)
  Problem: Health Behavior/Discharge Planning: Goal: Goals will be collaboratively established with patient/family Outcome: Not Progressing   Problem: Health Behavior/Discharge Planning: Goal: Ability to manage health-related needs will improve Outcome: Not Progressing   Problem: Clinical Measurements: Goal: Cardiovascular complication will be avoided Outcome: Not Progressing   Problem: Safety: Goal: Ability to remain free from injury will improve Outcome: Not Progressing

## 2022-07-11 DIAGNOSIS — I2693 Single subsegmental pulmonary embolism without acute cor pulmonale: Secondary | ICD-10-CM | POA: Diagnosis not present

## 2022-07-11 MED ORDER — HALOPERIDOL 1 MG PO TABS
1.0000 mg | ORAL_TABLET | Freq: Four times a day (QID) | ORAL | Status: DC | PRN
  Administered 2022-07-11 – 2022-07-12 (×2): 1 mg via ORAL
  Filled 2022-07-11 (×3): qty 1

## 2022-07-11 MED ORDER — HALOPERIDOL LACTATE 5 MG/ML IJ SOLN: 1.0000 mg | Freq: Four times a day (QID) | INTRAMUSCULAR | Status: DC | PRN

## 2022-07-11 MED ORDER — QUETIAPINE FUMARATE ER 50 MG PO TB24
50.0000 mg | ORAL_TABLET | Freq: Every day | ORAL | Status: DC
  Administered 2022-07-11: 50 mg via ORAL
  Filled 2022-07-11 (×2): qty 1

## 2022-07-11 NOTE — Progress Notes (Addendum)
Occupational Therapy Treatment Patient Details Name: Joshua Everett MRN: [id number] DOB: 12-Sep-1934 Today's Date: 07/11/2022   History of present illness Joshua Everett is an 71yoM who [facility] to [facility] on 07/06/22 after several falls at home. Wife is concerned that she can no longer meet the patient's care needs at home. PMH: Parkinson's disease, dementia. 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 Hip xray report says "Possible fracture of the right sacral ala. Subsequent pelvis CT negative for acute changes. " Chest CT revealing of acute PE RLL 'overall clot burden is very small.'   OT comments  Upon entering session, pt sitting up in recliner with wife present. Both agreeable to OT. Pt engaged in grooming tasks with set up-supervision and seated BUE AROM exercises (see details below). Pt tolerated well. He required visual demonstration for appropriate technique of exercises and Mod VC for redirection 2/2 perseverating on tasks t/o session. Pt left as received with all needs in reach. Pt is making progress toward goal completion. D/C recommendations changed to SNF this date due to high fall risk and decreased caregiver support at home. OT will continue to follow acutely.    Recommendations for follow up therapy are one component of a multi-disciplinary discharge planning process, led by the attending physician.  Recommendations may be updated based on patient status, additional functional criteria and insurance authorization.    Follow Up Recommendations  Skilled nursing-short term rehab (<3 hours/day)     Assistance Recommended at Discharge Frequent or constant Supervision/Assistance  Patient can return home with the following  Direct supervision/assist for financial management;Direct supervision/assist for medications management;Assistance with cooking/housework;Assist for transportation;Help with stairs or ramp for entrance;A lot of  help with bathing/dressing/bathroom;A lot of help with walking and/or transfers   Equipment Recommendations  BSC/3in1    Recommendations for Other Services      Precautions / Restrictions Precautions Precautions: Fall Restrictions Weight Bearing Restrictions: No       Mobility Bed Mobility Overal bed mobility: Needs Assistance             General bed mobility comments: pt received/left sitting in recliner    Transfers                         Balance Overall balance assessment: History of Falls, Needs assistance Sitting-balance support: Feet supported Sitting balance-Leahy Scale: Good                                     ADL either performed or assessed with clinical judgement   ADL Overall ADL's : Needs assistance/impaired     Grooming: Set up;Sitting;Cueing for sequencing;Wash/dry face;Oral care                                      Extremity/Trunk Assessment Upper Extremity Assessment Upper Extremity Assessment: Generalized weakness   Lower Extremity Assessment Lower Extremity Assessment: Generalized weakness        Vision Baseline Vision/History: 1 Wears glasses Patient Visual Report: No change from baseline     Perception     Praxis      Cognition Arousal/Alertness: Awake/alert Behavior During Therapy: Flat affect Overall Cognitive Status: History of cognitive impairments - at baseline  General Comments: Pt perseverating on certain tasks this date requiring Mod VC        Exercises General Exercises - Upper Extremity Shoulder Flexion: AROM, Both, 10 reps, Seated Shoulder Horizontal ABduction: AROM, Both, 10 reps, Seated Shoulder Horizontal ADduction: AROM, Both, 10 reps, Seated Elbow Flexion: AROM, Both, 10 reps, Seated Elbow Extension: AROM, Both, 10 reps, Seated Wrist Flexion: AROM, Both, 10 reps, Seated Wrist Extension: AROM, Both, 10 reps,  Seated    Shoulder Instructions       General Comments      Pertinent Vitals/ Pain       Pain Assessment Pain Assessment: No/denies pain  Home Living                                          Prior Functioning/Environment              Frequency  Min 2X/week        Progress Toward Goals  OT Goals(current goals can now be found in the care plan section)  Progress towards OT goals: Progressing toward goals  Acute Rehab OT Goals Patient Stated Goal: wife wants pt to get stronger, looking into hiring caregiver to help her out with pt during the day OT Goal Formulation: With family Time For Goal Achievement: 07/22/22 Potential to Achieve Goals: [place] Discharge plan needs to be updated;Frequency remains appropriate    Co-evaluation                 AM-PAC OT "6 Clicks" Daily Activity     Outcome Measure   Help from another person eating meals?: A Little Help from another person taking care of personal grooming?: A Little Help from another person toileting, which includes using toliet, bedpan, or urinal?: A Lot Help from another person bathing (including washing, rinsing, drying)?: A Lot Help from another person to put on and taking off regular upper body clothing?: A Little Help from another person to put on and taking off regular lower body clothing?: A Lot 6 Click Score: 15    End of Session    OT Visit Diagnosis: Other abnormalities of gait and mobility (R26.89);Other symptoms and signs involving cognitive function;Muscle weakness (generalized) (M62.81);History of falling (Z91.81)   Activity Tolerance Patient tolerated treatment well   Patient Left in chair;with call bell/phone within reach;with family/visitor present   Nurse Communication Mobility status        Time: 7342-8768 OT Time Calculation (min): 21 min  Charges: OT General Charges $OT Visit: 1 Visit OT Treatments $Self Care/Home Management : 8-22  mins  [facility] MS, OTR/L ascom [phone]  07/11/22, 1:10 PM

## 2022-07-11 NOTE — Plan of Care (Signed)
  Problem: Education: Goal: Knowledge of disease or condition will improve Outcome: Progressing Goal: Knowledge of secondary prevention will improve (MUST DOCUMENT ALL) Outcome: Progressing Goal: Knowledge of patient specific risk factors will improve [name] N/A or DELETE if not current risk factor) Outcome: Progressing   Problem: Ischemic Stroke/TIA Tissue Perfusion: Goal: Complications of ischemic stroke/TIA will be minimized Outcome: Progressing   Problem: Coping: Goal: Will verbalize positive feelings about self Outcome: Progressing Goal: Will identify appropriate support needs Outcome: Progressing   Problem: Health Behavior/Discharge Planning: Goal: Ability to manage health-related needs will improve Outcome: Progressing Goal: Goals will be collaboratively established with patient/family Outcome: Progressing   Problem: Self-Care: Goal: Ability to participate in self-care as condition permits will improve Outcome: Progressing Goal: Verbalization of feelings and concerns over difficulty with self-care will improve Outcome: Progressing Goal: Ability to communicate needs accurately will improve Outcome: Progressing   Problem: Nutrition: Goal: Risk of aspiration will decrease Outcome: Progressing Goal: Dietary intake will improve Outcome: Progressing   Problem: Education: Goal: Knowledge of General Education information will improve Description: Including pain rating scale, medication(s)/side effects and non-pharmacologic comfort measures Outcome: Progressing   Problem: Health Behavior/Discharge Planning: Goal: Ability to manage health-related needs will improve Outcome: Progressing   Problem: Clinical Measurements: Goal: Ability to maintain clinical measurements within normal limits will improve Outcome: Progressing Goal: Will remain free from infection Outcome: Progressing Goal: Diagnostic test results will improve Outcome: Progressing Goal: Respiratory  complications will improve Outcome: Progressing Goal: Cardiovascular complication will be avoided Outcome: Progressing   Problem: Activity: Goal: Risk for activity intolerance will decrease Outcome: Progressing   Problem: Nutrition: Goal: Adequate nutrition will be maintained Outcome: Progressing   Problem: Coping: Goal: Level of anxiety will decrease Outcome: Progressing   Problem: Elimination: Goal: Will not experience complications related to bowel motility Outcome: Progressing Goal: Will not experience complications related to urinary retention Outcome: Progressing   Problem: Pain Managment: Goal: General experience of comfort will improve Outcome: Progressing   Problem: Safety: Goal: Ability to remain free from injury will improve Outcome: Progressing   Problem: Skin Integrity: Goal: Risk for impaired skin integrity will decrease Outcome: Progressing   Problem: Education: Goal: Knowledge of General Education information will improve Description: Including pain rating scale, medication(s)/side effects and non-pharmacologic comfort measures Outcome: Progressing   Problem: Health Behavior/Discharge Planning: Goal: Ability to manage health-related needs will improve Outcome: Progressing   Problem: Clinical Measurements: Goal: Ability to maintain clinical measurements within normal limits will improve Outcome: Progressing Goal: Will remain free from infection Outcome: Progressing Goal: Diagnostic test results will improve Outcome: Progressing Goal: Respiratory complications will improve Outcome: Progressing Goal: Cardiovascular complication will be avoided Outcome: Progressing   Problem: Activity: Goal: Risk for activity intolerance will decrease Outcome: Progressing   Problem: Nutrition: Goal: Adequate nutrition will be maintained Outcome: Progressing   Problem: Coping: Goal: Level of anxiety will decrease Outcome: Progressing   Problem:  Elimination: Goal: Will not experience complications related to bowel motility Outcome: Progressing Goal: Will not experience complications related to urinary retention Outcome: Progressing   Problem: Pain Managment: Goal: General experience of comfort will improve Outcome: Progressing   Problem: Safety: Goal: Ability to remain free from injury will improve Outcome: Progressing   Problem: Skin Integrity: Goal: Risk for impaired skin integrity will decrease Outcome: Progressing

## 2022-07-11 NOTE — Progress Notes (Signed)
PROGRESS NOTE  Joshua Everett ZHG:[id number] DOB: January 08, 1935 DOA: 07/06/2022 PCP: Ria Bush, MD  Hospital Course/Subjective: 88 yo male who lives independently with his wife and has a history of HTN, HLD, parkinsons and is being admitted for multiple falls at home.  He was found to have small nonocclusive PE on CTA of the chest, bilateral lower extremity Dopplers negative.  He had been placed on IV heparin drip, after discussion with his wife given his high risk of recurrent falls, it was decided not to continue anticoagulation.  Patient is currently stable, with no acute medical issues, awaiting subacute nursing facility placement.    Seen this AM with wife at bedside. No acute issues overnight other than the usual agitation. Waiting for SNF placement. Discussed with TOC this AM as well.   Assessment/Plan: PE Small, subsegmental. No hypoxia or chest pain. Monitor off anticoagulation. Discussed with wife if he is ambulation becomes more safe, steady and he has no falls, she may consider initiating anticoagulation in the coming weeks. Can dw their PCP.  Suspected Sacral Ala Fracture Ruled out on CT pelvis  Fall Multiple falls at home, seen by PT who is recommending subacute nursing facility.  Recurrent falls Will also monitor for orthostatic or syncopal changes or presentation.  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 [facility]) Per chart review pt was on hctz but had orthostatic hypotension and was stopped.  Lisinopril was reduced changed to twice daily and started on low dose HCTZ, seem to be doing better on this with no hypotension.  Parkinson disease Per neurology note patient's Parkinson is worsening and has slowing of movement and even swallowing issues. Will continue his carbidopa levodopa regimen.  Essential hypertension Continue regimen as above with lisinopril 10 mg p.o. twice daily, and low-dose HCTZ  GERD (gastroesophageal reflux disease) IV  PPI.  Glaucoma Patient continued on home regimen of latanoprost   DVT Prophylaxis: Lovenox prophylactic dose  Code Status: FULL   Family Communication: Discussed with patient and wife at the bedside this morning, all questions answered to her satisfaction.  Disposition Plan: Awaiting SNF placement, discussed with TOC this morning as well.  Consultants: None   Procedures: None   Antimicrobials: Anti-infectives (From admission, onward)    None      Objective: Vitals:   07/10/22 0817 07/10/22 1557 07/10/22 2352 07/11/22 0817  BP: (!) 171/73 135/72 (!) 142/91 (!) 177/76  Pulse: 66 77 69 69  Resp: '16 16 18 16  '$ Temp: 97.8 F (36.6 C) 99.1 F (37.3 C) 98 F (36.7 C) 97.9 F (36.6 C)  TempSrc:      SpO2: 99% 97% 98% 99%  Weight:      Height:        Intake/Output Summary (Last 24 hours) at 07/11/2022 1042 Last data filed at 07/11/2022 0400 Gross per 24 hour  Intake --  Output 600 ml  Net -600 ml    Filed Weights   07/06/22 1348  Weight: 95.4 kg   Exam: General:  Alert, oriented to self only, calm, in no acute distress resting with wife Eyes: EOMI, clear conjuctivae, white sclerea Neck: supple, no masses, trachea mildline  Cardiovascular: RRR, no murmurs or rubs, no peripheral edema  Respiratory: clear to auscultation bilaterally, no wheezes, no crackles  Abdomen: soft, nontender, nondistended, normal bowel tones heard  Skin: dry, no rashes  Musculoskeletal: no joint effusions, normal range of motion  Psychiatric: appropriate affect, normal speech  Neurologic: extraocular muscles intact, clear speech, moving all extremities with  intact sensorium   Data Reviewed: CBC: Recent Labs  Lab 07/06/22 1350 07/08/22 0459 07/09/22 0555 07/10/22 0301  WBC 8.1 6.3 7.2 7.1  NEUTROABS 5.0  --   --   --   HGB 14.6 14.9 14.3 14.3  HCT 44.1 43.5 42.4 42.5  MCV 90.6 87.9 88.9 89.5  PLT 172 172 182 650    Basic Metabolic Panel: Recent Labs  Lab 07/06/22 1350  07/08/22 0459 07/09/22 0555 07/10/22 0301  NA 141 143 140 141  K 3.6 3.4* 3.5 3.6  CL 106 108 109 105  CO2 '25 28 26 28  '$ GLUCOSE 113* 119* 115* 135*  BUN '19 13 17 19  '$ CREATININE 0.91 0.83 0.74 0.84  CALCIUM 9.9 9.9 9.6 10.0    GFR: Estimated Creatinine Clearance: 74.6 mL/min (by C-G formula based on SCr of 0.84 mg/dL). Liver Function Tests: Recent Labs  Lab 07/06/22 1350  AST 23  ALT 5  ALKPHOS 43  BILITOT 1.1  PROT 6.5  ALBUMIN 3.7    No results for input(s): "LIPASE", "AMYLASE" in the last 168 hours. No results for input(s): "AMMONIA" in the last 168 hours. Coagulation Profile: No results for input(s): "INR", "PROTIME" in the last 168 hours. Cardiac Enzymes: No results for input(s): "CKTOTAL", "CKMB", "CKMBINDEX", "TROPONINI" in the last 168 hours. BNP (last 3 results) No results for input(s): "PROBNP" in the last 8760 hours. HbA1C: No results for input(s): "HGBA1C" in the last 72 hours. CBG: Recent Labs  Lab 07/08/22 0835  GLUCAP 101*    Lipid Profile: No results for input(s): "CHOL", "HDL", "LDLCALC", "TRIG", "CHOLHDL", "LDLDIRECT" in the last 72 hours.  Thyroid Function Tests: No results for input(s): "TSH", "T4TOTAL", "FREET4", "T3FREE", "THYROIDAB" in the last 72 hours. Anemia Panel: No results for input(s): "VITAMINB12", "FOLATE", "FERRITIN", "TIBC", "IRON", "RETICCTPCT" in the last 72 hours. Urine analysis:    Component Value Date/Time   COLORURINE YELLOW (A) 07/06/2022 1721   APPEARANCEUR CLEAR (A) 07/06/2022 1721   LABSPEC 1.012 07/06/2022 1721   PHURINE 6.0 07/06/2022 1721   GLUCOSEU NEGATIVE 07/06/2022 1721   HGBUR NEGATIVE 07/06/2022 1721   BILIRUBINUR NEGATIVE 07/06/2022 1721   BILIRUBINUR negative 01/06/2020 1000   KETONESUR NEGATIVE 07/06/2022 1721   PROTEINUR NEGATIVE 07/06/2022 1721   UROBILINOGEN 1.0 01/06/2020 1000   NITRITE NEGATIVE 07/06/2022 1721   LEUKOCYTESUR NEGATIVE 07/06/2022 1721   Sepsis  Labs: '@LABRCNTIP'$ (procalcitonin:4,lacticidven:4)  ) Recent Results (from the past 240 hour(s))  Resp panel by RT-PCR (RSV, Flu A&B, Covid) Anterior Nasal Swab     Status: None   Collection Time: 07/06/22  2:38 PM   Specimen: Anterior Nasal Swab  Result Value Ref Range Status   SARS Coronavirus 2 by RT PCR NEGATIVE NEGATIVE Final    Comment: (NOTE) SARS-CoV-2 target nucleic acids are NOT DETECTED.  The SARS-CoV-2 RNA is generally detectable in upper respiratory specimens during the acute phase of infection. The lowest concentration of SARS-CoV-2 viral copies this assay can detect is 138 copies/mL. A negative result does not preclude SARS-Cov-2 infection and should not be used as the sole basis for treatment or other patient management decisions. A negative result may occur with  improper specimen collection/handling, submission of specimen other than nasopharyngeal swab, presence of viral mutation(s) within the areas targeted by this assay, and inadequate number of viral copies(<138 copies/mL). A negative result must be combined with clinical observations, patient history, and epidemiological information. The expected result is Negative.  Fact Sheet for Patients:  EntrepreneurPulse.com.au  Fact Sheet for Healthcare  Providers:  IncredibleEmployment.be  This test is no t yet approved or cleared by the [place] and  has been authorized for detection and/or diagnosis of SARS-CoV-2 by FDA under an Emergency Use Authorization (EUA). This EUA will remain  in effect (meaning this test can be used) for the duration of the COVID-19 declaration under Section 564(b)(1) of the Act, 21 U.S.C.section 360bbb-3(b)(1), unless the authorization is terminated  or revoked sooner.       Influenza A by PCR NEGATIVE NEGATIVE Final   Influenza B by PCR NEGATIVE NEGATIVE Final    Comment: (NOTE) The Xpert Xpress SARS-CoV-2/FLU/RSV plus assay is intended as  an aid in the diagnosis of influenza from Nasopharyngeal swab specimens and should not be used as a sole basis for treatment. Nasal washings and aspirates are unacceptable for Xpert Xpress SARS-CoV-2/FLU/RSV testing.  Fact Sheet for Patients: EntrepreneurPulse.com.au  Fact Sheet for Healthcare Providers: IncredibleEmployment.be  This test is not yet approved or cleared by the [place] FDA and has been authorized for detection and/or diagnosis of SARS-CoV-2 by FDA under an Emergency Use Authorization (EUA). This EUA will remain in effect (meaning this test can be used) for the duration of the COVID-19 declaration under Section 564(b)(1) of the Act, 21 U.S.C. section 360bbb-3(b)(1), unless the authorization is terminated or revoked.     Resp Syncytial Virus by PCR NEGATIVE NEGATIVE Final    Comment: (NOTE) Fact Sheet for Patients: EntrepreneurPulse.com.au  Fact Sheet for Healthcare Providers: IncredibleEmployment.be  This test is not yet approved or cleared by the [place] FDA and has been authorized for detection and/or diagnosis of SARS-CoV-2 by FDA under an Emergency Use Authorization (EUA). This EUA will remain in effect (meaning this test can be used) for the duration of the COVID-19 declaration under Section 564(b)(1) of the Act, 21 U.S.C. section 360bbb-3(b)(1), unless the authorization is terminated or revoked.  Performed at [facility], [address] [place], [place] [address]      Studies: No results found.  Scheduled Meds:  aspirin  81 mg Oral Daily   carbidopa-levodopa  2 tablet Oral QID   enoxaparin (LOVENOX) injection  0.5 mg/kg Subcutaneous Q24H   hydrochlorothiazide  6.25 mg Oral Daily   latanoprost  1 drop Both Eyes QHS   lisinopril  10 mg Oral BID    Continuous Infusions:    LOS: 5 days   Time spent: 31 minutes  Jerolene Kupfer Neva Seat, MD [facility] Pager [phone]  If 7PM-7AM, please contact night-coverage [email or web address] Password [facility] 07/11/2022, 10:42 AM

## 2022-07-11 NOTE — TOC Progression Note (Addendum)
Transition of Care [facility]) - Progression Note    Patient Details  Name: Joshua Everett MRN: [id number] Date of Birth: 1934/09/26  Transition of Care [facility]) CM/SW [place], LCSW Phone Number: 07/11/2022, 8:27 AM  Clinical Narrative:  PASARR obtained: [phone] A.   11:54 am: Discussed bed offers with wife but she is concerned about ratings. She is agreeable to extending search to other counties. Left voicemail for admissions coordinator at [facility] asking her to review.  3:23 pm: Wife accepted bed offer from [facility]. Patient will have to be 48 hours without sitters, restraints, and IV "restraints." MD will order a scheduled PO med for bedtime.    Barriers to Discharge: Barriers Resolved  Expected Discharge Plan and Services         Expected Discharge Date: 07/09/22               DME Arranged: 3-N-1 DME Agency: [employer]       HH Arranged: RN, OT, PT, Nurse's Aide, Social Work [employer] Agency: Other - See comment (Patient active with an agency that she will call RN CM back about when she gets home. DC orders on in for all the listed services.)         Social Determinants of Health (SDOH) Interventions SDOH Screenings   Food Insecurity: No Food Insecurity (07/06/2022)  Housing: Low Risk  (07/06/2022)  Transportation Needs: No Transportation Needs (07/06/2022)  Utilities: Not At Risk (07/06/2022)  Alcohol Screen: Low Risk  (01/27/2022)  Depression (PHQ2-9): Medium Risk (06/19/2022)  Financial Resource Strain: Low Risk  (01/27/2022)  Physical Activity: Insufficiently Active (01/27/2022)  Social Connections: Moderately Isolated (01/27/2022)  Stress: No Stress Concern Present (01/27/2022)  Tobacco Use: Medium Risk (07/06/2022)    Readmission Risk Interventions     No data to display

## 2022-07-11 NOTE — Progress Notes (Signed)
Physical Therapy Treatment Patient Details Name: Joshua Everett MRN: [id number] DOB: January 14, 1935 Today's Date: 07/11/2022   History of Present Illness Earlin Sweeden is an 25yoM who comes to [facility] on 07/06/22 after several falls at home. Wife is concerned that she can no longer meet the patient's care needs at home. PMH: Parkinson's disease, dementia. 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 Hip xray report says "Possible fracture of the right sacral ala. Subsequent pelvis CT negative for acute changes. " Chest CT revealing of acute PE RLL 'overall clot burden is very small.'    PT Comments    Pt was awake, long sitting in bed upon arrival. He is alert but presents with flat affect. Needs increased time and Vcs/Tcs for desired task requested of him. Baseline cognition deficits noted. Spouse present throughout and is very supportive. PT was able to exit R side of bed, stand to RW, and ambulate ~ 65 ft. Pt has parkinsonian gait with shuffling noted. Flexed gait posture. Unable to correct with vcs. Needed increased assistance with turning to prevent fall. Overall pt tolerated well but is far from baseline abilities. Author recommends DC to STR to maximize independence while decreasing caregiver burden.    Recommendations for follow up therapy are one component of a multi-disciplinary discharge planning process, led by the attending physician.  Recommendations may be updated based on patient status, additional functional criteria and insurance authorization.  Follow Up Recommendations  Skilled nursing-short term rehab (<3 hours/day)     Assistance Recommended at Discharge Frequent or constant Supervision/Assistance  Patient can return home with the following Assistance with feeding;Assist for transportation;Direct supervision/assist for financial management;Direct supervision/assist for medications management;Assistance with  cooking/housework;A lot of help with walking and/or transfers;A lot of help with bathing/dressing/bathroom;Help with stairs or ramp for entrance   Equipment Recommendations  Other (comment) (defer to next level of care)       Precautions / Restrictions Precautions Precautions: Fall Restrictions Weight Bearing Restrictions: No     Mobility  Bed Mobility Overal bed mobility: Needs Assistance Bed Mobility: Supine to Sit, Sit to Supine  Supine to sit: Min assist  General bed mobility comments: min assist to exit R side of bed. increased time with constant vcs/tcs for sequencing    Transfers Overall transfer level: Needs assistance Equipment used: Rolling walker (2 wheels) Transfers: Sit to/from Stand Sit to Stand: Min assist, Mod assist  General transfer comment: pt required physical assist to safely stand EOB to RW    Ambulation/Gait Ambulation/Gait assistance: Min assist Gait Distance (Feet): 65 Feet Assistive device: Rolling walker (2 wheels) Gait Pattern/deviations: Shuffle, Trunk flexed Gait velocity: decreased     General Gait Details: pt tends to ambulate with flexed position and RW far out in front. Unable to correct. min assist at times to prevent falling. poor awareness of safety throughout.   Balance Overall balance assessment: History of Falls, Needs assistance Sitting-balance support: Feet supported Sitting balance-Leahy Scale: Good     Standing balance support: Bilateral upper extremity supported, Reliant on assistive device for balance, During functional activity Standing balance-Leahy Scale: Poor Standing balance comment: pt is high fall risk       Cognition Arousal/Alertness: Awake/alert Behavior During Therapy: Flat affect Overall Cognitive Status: History of cognitive impairments - at baseline      General Comments: Pt with dementia at baseline. Oriented to self only. Required max multimodal cues forinitiation/sequencing/following single step  commands. Per staff, gets aggitated easily  but was pleasant and cooperastive this session.               Pertinent Vitals/Pain Pain Assessment Pain Assessment: No/denies pain Breathing: normal     PT Goals (current goals can now be found in the care plan section) Acute Rehab PT Goals Patient Stated Goal: none stated. Spouse present and hopeful for rehab bed soon Progress towards PT goals: Progressing toward goals    Frequency    Min 2X/week      PT Plan Current plan remains appropriate       AM-PAC PT "6 Clicks" Mobility   Outcome Measure  Help needed turning from your back to your side while in a flat bed without using bedrails?: A Lot Help needed moving from lying on your back to sitting on the side of a flat bed without using bedrails?: A Lot Help needed moving to and from a bed to a chair (including a wheelchair)?: A Lot Help needed standing up from a chair using your arms (e.g., wheelchair or bedside chair)?: A Lot Help needed to walk in hospital room?: A Lot Help needed climbing 3-5 steps with a railing? : A Lot 6 Click Score: 12    End of Session Equipment Utilized During Treatment: Gait belt Activity Tolerance: Patient tolerated treatment well Patient left: in chair;with call bell/phone within reach;with family/visitor present Nurse Communication: Mobility status PT Visit Diagnosis: Unsteadiness on feet (R26.81);Difficulty in walking, not elsewhere classified (R26.2);Repeated falls (R29.6)     Time: 2947-6546 PT Time Calculation (min) (ACUTE ONLY): 22 min  Charges:  $Gait Training: 8-22 mins                     Julaine Fusi PTA 07/11/22, 1:06 PM

## 2022-07-12 ENCOUNTER — Telehealth: Payer: Self-pay | Admitting: Family Medicine

## 2022-07-12 DIAGNOSIS — H409 Unspecified glaucoma: Secondary | ICD-10-CM

## 2022-07-12 DIAGNOSIS — R410 Disorientation, unspecified: Secondary | ICD-10-CM | POA: Diagnosis not present

## 2022-07-12 DIAGNOSIS — G903 Multi-system degeneration of the autonomic nervous system: Secondary | ICD-10-CM

## 2022-07-12 DIAGNOSIS — R296 Repeated falls: Secondary | ICD-10-CM

## 2022-07-12 DIAGNOSIS — I10 Essential (primary) hypertension: Secondary | ICD-10-CM

## 2022-07-12 DIAGNOSIS — I2699 Other pulmonary embolism without acute cor pulmonale: Secondary | ICD-10-CM | POA: Diagnosis not present

## 2022-07-12 DIAGNOSIS — E669 Obesity, unspecified: Secondary | ICD-10-CM | POA: Insufficient documentation

## 2022-07-12 LAB — GLUCOSE, CAPILLARY: Glucose-Capillary: 100 mg/dL — ABNORMAL HIGH (ref 70–99)

## 2022-07-12 MED ORDER — QUETIAPINE FUMARATE 25 MG PO TABS
50.0000 mg | ORAL_TABLET | Freq: Every day | ORAL | Status: DC
  Administered 2022-07-12 – 2022-07-13 (×2): 50 mg via ORAL
  Filled 2022-07-12 (×2): qty 2

## 2022-07-12 MED ORDER — TIMOLOL MALEATE 0.5 % OP SOLN
1.0000 [drp] | Freq: Every day | OPHTHALMIC | Status: DC
  Administered 2022-07-12 – 2022-07-14 (×3): 1 [drp] via OPHTHALMIC
  Filled 2022-07-12: qty 5

## 2022-07-12 MED ORDER — QUETIAPINE FUMARATE 25 MG PO TABS: 25.0000 mg | ORAL_TABLET | Freq: Every day | ORAL | 1 refills | Status: DC

## 2022-07-12 NOTE — Plan of Care (Signed)
  Problem: Education: Goal: Knowledge of disease or condition will improve Outcome: Progressing Goal: Knowledge of secondary prevention will improve (MUST DOCUMENT ALL) Outcome: Progressing Goal: Knowledge of patient specific risk factors will improve [name] N/A or DELETE if not current risk factor) Outcome: Progressing   Problem: Ischemic Stroke/TIA Tissue Perfusion: Goal: Complications of ischemic stroke/TIA will be minimized Outcome: Progressing   Problem: Coping: Goal: Will verbalize positive feelings about self Outcome: Progressing Goal: Will identify appropriate support needs Outcome: Progressing   Problem: Health Behavior/Discharge Planning: Goal: Ability to manage health-related needs will improve Outcome: Progressing Goal: Goals will be collaboratively established with patient/family Outcome: Progressing   Problem: Self-Care: Goal: Ability to participate in self-care as condition permits will improve Outcome: Progressing Goal: Verbalization of feelings and concerns over difficulty with self-care will improve Outcome: Progressing Goal: Ability to communicate needs accurately will improve Outcome: Progressing   Problem: Nutrition: Goal: Risk of aspiration will decrease Outcome: Progressing Goal: Dietary intake will improve Outcome: Progressing   Problem: Education: Goal: Knowledge of General Education information will improve Description: Including pain rating scale, medication(s)/side effects and non-pharmacologic comfort measures Outcome: Progressing   Problem: Health Behavior/Discharge Planning: Goal: Ability to manage health-related needs will improve Outcome: Progressing   Problem: Clinical Measurements: Goal: Ability to maintain clinical measurements within normal limits will improve Outcome: Progressing Goal: Will remain free from infection Outcome: Progressing Goal: Diagnostic test results will improve Outcome: Progressing Goal: Respiratory  complications will improve Outcome: Progressing Goal: Cardiovascular complication will be avoided Outcome: Progressing   Problem: Activity: Goal: Risk for activity intolerance will decrease Outcome: Progressing   Problem: Nutrition: Goal: Adequate nutrition will be maintained Outcome: Progressing   Problem: Coping: Goal: Level of anxiety will decrease Outcome: Progressing   Problem: Elimination: Goal: Will not experience complications related to bowel motility Outcome: Progressing Goal: Will not experience complications related to urinary retention Outcome: Progressing   Problem: Pain Managment: Goal: General experience of comfort will improve Outcome: Progressing   Problem: Safety: Goal: Ability to remain free from injury will improve Outcome: Progressing   Problem: Skin Integrity: Goal: Risk for impaired skin integrity will decrease Outcome: Progressing   Problem: Education: Goal: Knowledge of General Education information will improve Description: Including pain rating scale, medication(s)/side effects and non-pharmacologic comfort measures Outcome: Progressing   Problem: Health Behavior/Discharge Planning: Goal: Ability to manage health-related needs will improve Outcome: Progressing   Problem: Clinical Measurements: Goal: Ability to maintain clinical measurements within normal limits will improve Outcome: Progressing Goal: Will remain free from infection Outcome: Progressing Goal: Diagnostic test results will improve Outcome: Progressing Goal: Respiratory complications will improve Outcome: Progressing Goal: Cardiovascular complication will be avoided Outcome: Progressing   Problem: Activity: Goal: Risk for activity intolerance will decrease Outcome: Progressing   Problem: Nutrition: Goal: Adequate nutrition will be maintained Outcome: Progressing   Problem: Coping: Goal: Level of anxiety will decrease Outcome: Progressing   Problem:  Elimination: Goal: Will not experience complications related to bowel motility Outcome: Progressing Goal: Will not experience complications related to urinary retention Outcome: Progressing   Problem: Pain Managment: Goal: General experience of comfort will improve Outcome: Progressing   Problem: Safety: Goal: Ability to remain free from injury will improve Outcome: Progressing   Problem: Skin Integrity: Goal: Risk for impaired skin integrity will decrease Outcome: Progressing

## 2022-07-12 NOTE — Telephone Encounter (Signed)
Patient wife called in and stated that Sharon is currently in the hospital and will more than likely be discharged to a rehab. She stated that he has a blood clot in a lung and had a series of falls, which lead them to go to the hospital. She just wanted to update Dr. Darnell Level. Thank you!

## 2022-07-12 NOTE — Progress Notes (Signed)
PROGRESS NOTE  Joshua Everett XNA:[id number] DOB: [birth year]   PCP: Ria Bush, MD  Patient is from: Home  DOA: 07/06/2022 LOS: 6  Chief complaints Chief Complaint  Patient presents with   Fall     Brief Narrative / Interim history: 88 year old M with PMH of Parkinson's disease, dementia, HTN and HLD admitted for multiple falls at home. He was found to have small nonocclusive PE on CTA of the chest, bilateral lower extremity Dopplers negative. He had been placed on IV heparin drip, after discussion with his wife given his high risk of recurrent falls, it was decided not to continue anticoagulation. Patient is currently stable, with no acute medical issues, awaiting subacute nursing facility placement.   Hospital course complicated by delirium that seems to be improving.  Subjective: Seen and examined earlier this morning.  No major events overnight of this morning.  Patient's wife at bedside.  Patient does not converse and only chuckles when asked questions.  Per wife, he does days often at home at baseline.  Does not appear to be in distress.  Follows some commands.  Objective: Vitals:   07/10/22 2352 07/11/22 0817 07/11/22 1609 07/11/22 2241  BP: (!) 142/91 (!) 177/76 125/71 (!) 176/82  Pulse: 69 69 82 70  Resp: '18 16 17 17  '$ Temp: 98 F (36.7 C) 97.9 F (36.6 C) 98.6 F (37 C) 98.1 F (36.7 C)  TempSrc:      SpO2: 98% 99% 97% 96%  Weight:      Height:        Examination:  GENERAL: No apparent distress.  Nontoxic. HEENT: MMM.  Vision and hearing grossly intact.  NECK: Supple.  No apparent JVD.  RESP:  No IWOB.  Fair aeration bilaterally. CVS:  RRR. Heart sounds normal.  ABD/GI/GU: BS+. Abd soft, NTND.  MSK/EXT:  Moves extremities. No apparent deformity. No edema.  SKIN: no apparent skin lesion or wound NEURO: Awake and alert.  Not able to assess orientation.  He only child close in response to questions.  Follows some commands.  No apparent focal neuro  deficit. PSYCH: Calm. Normal affect.   Procedures:  None  Microbiology summarized: [id number], influenza and RSV PCR nonreactive.  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 Suspected sacral alla fracture: Ruled out on CT pelvis. -Fall precaution. -Therapy recommended SNF.  Acute pulmonary embolism without cor pulmonale: CTA showed small subsegmental nonobstructive PE.  Initially started on heparin.  Anticoagulation discontinued after risk and benefit discussion between previous attending and patient's wife given patient's recurrent fall.   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 -Consider compression wounds   Parkinson disease: Seems to be progressive per his neurologist. -Continue home Sinemet   Essential hypertension -Antihypertensive meds as above.   GERD (gastroesophageal reflux disease) -Continue PPI  Delirium/Parkinson dementia: Improving. -Discontinue Haldol and Ativan. -Seroquel at night   Glaucoma -Continue home eyedrop   Obesity Body mass index is 30.18 kg/m.          DVT prophylaxis:  On subcu Lovenox.  Code Status: Full code Family Communication: Updated patient's wife at bedside Level of care: Telemetry Medical Status is: Inpatient Remains inpatient appropriate because: Delirium,  generalized weakness and safe disposition   Final disposition: SNF Consultants:  None  35 minutes with more than 50% spent in reviewing records, counseling patient/family and coordinating care.   Sch Meds:  Scheduled Meds:  aspirin  81 mg Oral Daily   carbidopa-levodopa  2 tablet Oral QID    enoxaparin (LOVENOX) injection  0.5 mg/kg Subcutaneous Q24H   hydrochlorothiazide  6.25 mg Oral Daily   latanoprost  1 drop Both Eyes QHS   lisinopril  10 mg Oral BID   QUEtiapine  50 mg Oral QHS   Continuous Infusions: PRN Meds:.hydrALAZINE, LORazepam  Antimicrobials: Anti-infectives (From admission, onward)    None        I have personally reviewed the following labs and images: CBC: Recent Labs  Lab 07/06/22 1350 07/08/22 0459 07/09/22 0555 07/10/22 0301  WBC 8.1 6.3 7.2 7.1  NEUTROABS 5.0  --   --   --   HGB 14.6 14.9 14.3 14.3  HCT 44.1 43.5 42.4 42.5  MCV 90.6 87.9 88.9 89.5  PLT 172 172 182 181   BMP &GFR Recent Labs  Lab 07/06/22 1350 07/08/22 0459 07/09/22 0555 07/10/22 0301  NA 141 143 140 141  K 3.6 3.4* 3.5 3.6  CL 106 108 109 105  CO2 '25 28 26 28  '$ GLUCOSE 113* 119* 115* 135*  BUN '19 13 17 19  '$ CREATININE 0.91 0.83 0.74 0.84  CALCIUM 9.9 9.9 9.6 10.0   Estimated Creatinine Clearance: 74.6 mL/min (by C-G formula based on SCr of 0.84 mg/dL). Liver & Pancreas: Recent Labs  Lab 07/06/22 1350  AST 23  ALT 5  ALKPHOS 43  BILITOT 1.1  PROT 6.5  ALBUMIN 3.7   No results for input(s): "LIPASE", "AMYLASE" in the last 168 hours. No results for input(s): "AMMONIA" in the last 168 hours. Diabetic: No results for input(s): "HGBA1C" in the last 72 hours. Recent Labs  Lab 07/08/22 0835 07/12/22 0726  GLUCAP 101* 100*   Cardiac Enzymes: No results for input(s): "CKTOTAL", "CKMB", "CKMBINDEX", "TROPONINI" in the last 168 hours. No results for input(s): "PROBNP" in the last 8760 hours. Coagulation Profile: No results for input(s): "INR", "PROTIME" in the last 168 hours. Thyroid Function Tests: No results for input(s): "TSH", "T4TOTAL", "FREET4", "T3FREE", "THYROIDAB" in the last 72 hours. Lipid Profile: No results for input(s): "CHOL", "HDL", "LDLCALC", "TRIG", "CHOLHDL", "LDLDIRECT" in the last 72 hours. Anemia Panel: No results for input(s):  "VITAMINB12", "FOLATE", "FERRITIN", "TIBC", "IRON", "RETICCTPCT" in the last 72 hours. Urine analysis:    Component Value Date/Time   COLORURINE YELLOW (A) 07/06/2022 1721   APPEARANCEUR CLEAR (A) 07/06/2022 1721   LABSPEC 1.012 07/06/2022 1721   PHURINE 6.0 07/06/2022 1721   GLUCOSEU NEGATIVE 07/06/2022 1721   HGBUR NEGATIVE 07/06/2022 1721   BILIRUBINUR NEGATIVE 07/06/2022 1721   BILIRUBINUR negative 01/06/2020 1000   KETONESUR NEGATIVE 07/06/2022 1721   PROTEINUR NEGATIVE 07/06/2022 1721   UROBILINOGEN 1.0 01/06/2020 1000   NITRITE NEGATIVE 07/06/2022 1721   LEUKOCYTESUR NEGATIVE 07/06/2022 1721   Sepsis Labs: Invalid input(s): "PROCALCITONIN", "LACTICIDVEN"  Microbiology: Recent Results (from the past 240 hour(s))  Resp panel by RT-PCR (RSV, Flu A&B, Covid) Anterior Nasal Swab     Status: None   Collection Time: 07/06/22  2:38 PM   Specimen: Anterior Nasal Swab  Result Value Ref Range Status   SARS Coronavirus 2 by RT PCR NEGATIVE NEGATIVE Final    Comment: (NOTE) SARS-CoV-2 target nucleic acids are NOT DETECTED.  The SARS-CoV-2 RNA is generally  detectable in upper respiratory specimens during the acute phase of infection. The lowest concentration of SARS-CoV-2 viral copies this assay can detect is 138 copies/mL. A negative result does not preclude SARS-Cov-2 infection and should not be used as the sole basis for treatment or other patient management decisions. A negative result may occur with  improper specimen collection/handling, submission of specimen other than nasopharyngeal swab, presence of viral mutation(s) within the areas targeted by this assay, and inadequate number of viral copies(<138 copies/mL). A negative result must be combined with clinical observations, patient history, and epidemiological information. The expected result is Negative.  Fact Sheet for Patients:  EntrepreneurPulse.com.au  Fact Sheet for Healthcare Providers:   IncredibleEmployment.be  This test is no t yet approved or cleared by the [place] FDA and  has been authorized for detection and/or diagnosis of SARS-CoV-2 by FDA under an Emergency Use Authorization (EUA). This EUA will remain  in effect (meaning this test can be used) for the duration of the COVID-19 declaration under Section 564(b)(1) of the Act, 21 U.S.C.section 360bbb-3(b)(1), unless the authorization is terminated  or revoked sooner.       Influenza A by PCR NEGATIVE NEGATIVE Final   Influenza B by PCR NEGATIVE NEGATIVE Final    Comment: (NOTE) The Xpert Xpress SARS-CoV-2/FLU/RSV plus assay is intended as an aid in the diagnosis of influenza from Nasopharyngeal swab specimens and should not be used as a sole basis for treatment. Nasal washings and aspirates are unacceptable for Xpert Xpress SARS-CoV-2/FLU/RSV testing.  Fact Sheet for Patients: EntrepreneurPulse.com.au  Fact Sheet for Healthcare Providers: IncredibleEmployment.be  This test is not yet approved or cleared by the [place] FDA and has been authorized for detection and/or diagnosis of SARS-CoV-2 by FDA under an Emergency Use Authorization (EUA). This EUA will remain in effect (meaning this test can be used) for the duration of the COVID-19 declaration under Section 564(b)(1) of the Act, 21 U.S.C. section 360bbb-3(b)(1), unless the authorization is terminated or revoked.     Resp Syncytial Virus by PCR NEGATIVE NEGATIVE Final    Comment: (NOTE) Fact Sheet for Patients: EntrepreneurPulse.com.au  Fact Sheet for Healthcare Providers: IncredibleEmployment.be  This test is not yet approved or cleared by the [place] FDA and has been authorized for detection and/or diagnosis of SARS-CoV-2 by FDA under an Emergency Use Authorization (EUA). This EUA will remain in effect (meaning this test can be used) for  the duration of the COVID-19 declaration under Section 564(b)(1) of the Act, 21 U.S.C. section 360bbb-3(b)(1), unless the authorization is terminated or revoked.  Performed at [facility], [address], [place],  [address]     Radiology Studies: No results found.    Rafe Mackowski T. [place]  If 7PM-7AM, please contact night-coverage [email or web address] 07/12/2022, 12:18 PM

## 2022-07-12 NOTE — Plan of Care (Signed)
  Problem: Education: Goal: Knowledge of disease or condition will improve Outcome: Progressing Goal: Knowledge of secondary prevention will improve (MUST DOCUMENT ALL) Outcome: Progressing Goal: Knowledge of patient specific risk factors will improve Joshua Everett N/A or DELETE if not current risk factor) Outcome: Progressing   Problem: Ischemic Stroke/TIA Tissue Perfusion: Goal: Complications of ischemic stroke/TIA will be minimized Outcome: Progressing   Problem: Coping: Goal: Will verbalize positive feelings about self Outcome: Progressing Goal: Will identify appropriate support needs Outcome: Progressing   Problem: Health Behavior/Discharge Planning: Goal: Ability to manage health-related needs will improve Outcome: Progressing Goal: Goals will be collaboratively established with patient/family Outcome: Progressing   Problem: Self-Care: Goal: Ability to participate in self-care as condition permits will improve Outcome: Progressing Goal: Verbalization of feelings and concerns over difficulty with self-care will improve Outcome: Progressing Goal: Ability to communicate needs accurately will improve Outcome: Progressing   Problem: Nutrition: Goal: Risk of aspiration will decrease Outcome: Progressing Goal: Dietary intake will improve Outcome: Progressing   Problem: Education: Goal: Knowledge of General Education information will improve Description: Including pain rating scale, medication(s)/side effects and non-pharmacologic comfort measures Outcome: Progressing   Problem: Health Behavior/Discharge Planning: Goal: Ability to manage health-related needs will improve Outcome: Progressing   Problem: Clinical Measurements: Goal: Ability to maintain clinical measurements within normal limits will improve Outcome: Progressing Goal: Will remain free from infection Outcome: Progressing Goal: Diagnostic test results will improve Outcome: Progressing Goal: Respiratory  complications will improve Outcome: Progressing Goal: Cardiovascular complication will be avoided Outcome: Progressing   Problem: Activity: Goal: Risk for activity intolerance will decrease Outcome: Progressing   Problem: Nutrition: Goal: Adequate nutrition will be maintained Outcome: Progressing   Problem: Coping: Goal: Level of anxiety will decrease Outcome: Progressing   Problem: Elimination: Goal: Will not experience complications related to bowel motility Outcome: Progressing Goal: Will not experience complications related to urinary retention Outcome: Progressing   Problem: Pain Managment: Goal: General experience of comfort will improve Outcome: Progressing   Problem: Safety: Goal: Ability to remain free from injury will improve Outcome: Progressing   Problem: Skin Integrity: Goal: Risk for impaired skin integrity will decrease Outcome: Progressing   Problem: Education: Goal: Knowledge of General Education information will improve Description: Including pain rating scale, medication(s)/side effects and non-pharmacologic comfort measures Outcome: Progressing   Problem: Health Behavior/Discharge Planning: Goal: Ability to manage health-related needs will improve Outcome: Progressing   Problem: Clinical Measurements: Goal: Ability to maintain clinical measurements within normal limits will improve Outcome: Progressing Goal: Will remain free from infection Outcome: Progressing Goal: Diagnostic test results will improve Outcome: Progressing Goal: Respiratory complications will improve Outcome: Progressing Goal: Cardiovascular complication will be avoided Outcome: Progressing   Problem: Activity: Goal: Risk for activity intolerance will decrease Outcome: Progressing   Problem: Nutrition: Goal: Adequate nutrition will be maintained Outcome: Progressing   Problem: Coping: Goal: Level of anxiety will decrease Outcome: Progressing   Problem:  Elimination: Goal: Will not experience complications related to bowel motility Outcome: Progressing Goal: Will not experience complications related to urinary retention Outcome: Progressing   Problem: Pain Managment: Goal: General experience of comfort will improve Outcome: Progressing   Problem: Safety: Goal: Ability to remain free from injury will improve Outcome: Progressing   Problem: Skin Integrity: Goal: Risk for impaired skin integrity will decrease Outcome: Progressing

## 2022-07-12 NOTE — Telephone Encounter (Signed)
Noted! Thank you

## 2022-07-13 DIAGNOSIS — I2699 Other pulmonary embolism without acute cor pulmonale: Secondary | ICD-10-CM | POA: Diagnosis not present

## 2022-07-13 DIAGNOSIS — R296 Repeated falls: Secondary | ICD-10-CM | POA: Diagnosis not present

## 2022-07-13 DIAGNOSIS — R410 Disorientation, unspecified: Secondary | ICD-10-CM | POA: Diagnosis not present

## 2022-07-13 DIAGNOSIS — I10 Essential (primary) hypertension: Secondary | ICD-10-CM | POA: Diagnosis not present

## 2022-07-13 NOTE — Progress Notes (Signed)
Occupational Therapy Treatment Patient Details Name: Joshua Everett MRN: [id number] DOB: 01/24/1935 Today's Date: 07/13/2022   History of present illness Joshua Everett is an 82yoM who [facility] on 07/06/22 after several falls at home. Wife is concerned that she can no longer meet the patient's care needs at home. PMH: Parkinson's disease, dementia. 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 Hip xray report says "Possible fracture of the right sacral ala. Subsequent pelvis CT negative for acute changes. " Chest CT revealing of acute PE RLL 'overall clot burden is very small.'   OT comments  Upon entering the room, pt seated in recliner chair with wife present in room. Pt agreeable to participate with encouragement throughout from wife. Pt stands with mod lifting assistance and ambulates with RW and min A 20' to sink. Pt unable to sequence side steps in tight spaces and room needing to be moved around to allow him to forward ambulate during session. Pt unable to sequence brushing teeth and instead rubs toothpaste from brush onto hands to wash them in sink and begins to clean sink ledge with min A for standing balance. Pt sits and hospital gown is changed. He stands once more and therapist assist with hygiene and application of barrier cream. Pt does then brush teeth with assistance from wife to sequence and initiate and therapist assisting with standing balance. Mod A to return to supine. All needs within reach and wife remains in room. Pt continues to benefit from OT intervention with recommendation for SNF at discharge.    Recommendations for follow up therapy are one component of a multi-disciplinary discharge planning process, led by the attending physician.  Recommendations may be updated based on patient status, additional functional criteria and insurance authorization.    Follow Up Recommendations  Skilled nursing-short term rehab  (<3 hours/day)     Assistance Recommended at Discharge Frequent or constant Supervision/Assistance  Patient can return home with the following  Direct supervision/assist for financial management;Direct supervision/assist for medications management;Assistance with cooking/housework;Assist for transportation;Help with stairs or ramp for entrance;A lot of help with bathing/dressing/bathroom;A lot of help with walking and/or transfers   Equipment Recommendations  None recommended by OT       Precautions / Restrictions Precautions Precautions: Fall Restrictions Weight Bearing Restrictions: No       Mobility Bed Mobility Overal bed mobility: Needs Assistance Bed Mobility: Sit to Supine       Sit to supine: Mod assist   General bed mobility comments: mod A for B LEs to return to bed    Transfers Overall transfer level: Needs assistance Equipment used: Rolling walker (2 wheels) Transfers: Sit to/from Stand Sit to Stand: Min assist, Mod assist                 Balance Overall balance assessment: History of Falls, Needs assistance Sitting-balance support: Feet supported Sitting balance-Leahy Scale: Good     Standing balance support: Bilateral upper extremity supported, Reliant on assistive device for balance, During functional activity Standing balance-Leahy Scale: Poor Standing balance comment: pt is high fall risk                           ADL either performed or assessed with clinical judgement   ADL Overall ADL's : Needs assistance/impaired     Grooming: Wash/dry hands;Oral care;Standing;Minimal assistance;Min guard;Cueing for safety;Cueing for sequencing  Extremity/Trunk Assessment Upper Extremity Assessment Upper Extremity Assessment: Generalized weakness   Lower Extremity Assessment Lower Extremity Assessment: Generalized weakness        Vision Patient Visual Report: No change from  baseline            Cognition Arousal/Alertness: Awake/alert Behavior During Therapy: Flat affect Overall Cognitive Status: History of cognitive impairments - at baseline                                 General Comments: Pt with baseline cognition deficits at baseline. Pt needing mod - max multimodal cuing to initiate and sequence tasks.                   Pertinent Vitals/ Pain       Pain Assessment Pain Assessment: No/denies pain         Frequency  Min 2X/week        Progress Toward Goals  OT Goals(current goals can now be found in the care plan section)  Progress towards OT goals: Progressing toward goals  Acute Rehab OT Goals Patient Stated Goal: to go to rehab OT Goal Formulation: With family Time For Goal Achievement: 07/22/22 Potential to Achieve Goals: [place] Discharge plan needs to be updated;Frequency remains appropriate       AM-PAC OT "6 Clicks" Daily Activity     Outcome Measure   Help from another person eating meals?: A Little Help from another person taking care of personal grooming?: A Little Help from another person toileting, which includes using toliet, bedpan, or urinal?: A Lot Help from another person bathing (including washing, rinsing, drying)?: A Lot Help from another person to put on and taking off regular upper body clothing?: A Little Help from another person to put on and taking off regular lower body clothing?: A Lot 6 Click Score: 15    End of Session Equipment Utilized During Treatment: Rolling walker (2 wheels)  OT Visit Diagnosis: Other abnormalities of gait and mobility (R26.89);Other symptoms and signs involving cognitive function;Muscle weakness (generalized) (M62.81);History of falling (Z91.81)   Activity Tolerance Patient tolerated treatment well   Patient Left in chair;with call bell/phone within reach;with family/visitor present   Nurse Communication Mobility status        Time:  1356-1420 OT Time Calculation (min): 24 min  Charges: OT General Charges $OT Visit: 1 Visit OT Treatments $Self Care/Home Management : 23-37 mins  Darleen Crocker, MS, OTR/L , CBIS ascom [phone]  07/13/22, 2:39 PM

## 2022-07-13 NOTE — Progress Notes (Signed)
Physical Therapy Treatment Patient Details Name: Joshua Everett MRN: [id number] DOB: [birth year] Today's Date: 07/13/2022   History of Present Illness Joshua Everett is an 87yoM who comes to [facility] on 07/06/22 after several falls at home. Wife is concerned that she can no longer meet the patient's care needs at home. PMH: Parkinson's disease, dementia. 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 Hip xray report says "Possible fracture of the right sacral ala. Subsequent pelvis CT negative for acute changes. " Chest CT revealing of acute PE RLL 'overall clot burden is very small.'    PT Comments    Pt was long sitting in bed upon arriving. He is alert but presents with flat affect. Does not volunteer information but was able to follow simple commands at times. Pt is inconsistent with follow commands but overall did perform all desired task. He was able to exit R side of bed, stand to RW, and ambulate ~ 160 ft around RN station. Pt has poor gait posture with shuffling gait kinematics overall. Rehab remains most appropriate DC disposition.    Recommendations for follow up therapy are one component of a multi-disciplinary discharge planning process, led by the attending physician.  Recommendations may be updated based on patient status, additional functional criteria and insurance authorization.  Follow Up Recommendations  Skilled nursing-short term rehab (<3 hours/day)     Assistance Recommended at Discharge Frequent or constant Supervision/Assistance  Patient can return home with the following Assistance with feeding;Assist for transportation;Direct supervision/assist for financial management;Direct supervision/assist for medications management;Assistance with cooking/housework;A lot of help with walking and/or transfers;A lot of help with bathing/dressing/bathroom;Help with stairs or ramp for entrance   Equipment Recommendations  Other  (comment) (defer to next level of care)       Precautions / Restrictions Precautions Precautions: Fall Restrictions Weight Bearing Restrictions: No     Mobility  Bed Mobility Overal bed mobility: Needs Assistance Bed Mobility: Supine to Sit, Sit to Supine  Supine to sit: Mod assist, Max assist, HOB elevated  General bed mobility comments: Pt required more assistance today due to poor cognition. He has poor initiation of movements and needs vcs/tcs for desired task at hand.    Transfers Overall transfer level: Needs assistance Equipment used: Rolling walker (2 wheels) Transfers: Sit to/from Stand Sit to Stand: Min assist, Mod assist    General transfer comment: Pt was able to stand from EOB and recliner surface with min-mod assist of one. Poor initiation of movements but once pt understands desired task requested of him, Is able to perform with min A.    Ambulation/Gait Ambulation/Gait assistance: Min assist Gait Distance (Feet): 200 Feet Assistive device: Rolling walker (2 wheels) Gait Pattern/deviations: Shuffle, Trunk flexed Gait velocity: decreased     General Gait Details: Pt tends to have flexed posture with shuffling gait pattern. Ambulated 200 ft with RW with shuffling gait kinematics    Balance Overall balance assessment: History of Falls, Needs assistance Sitting-balance support: Feet supported Sitting balance-Leahy Scale: Good     Standing balance support: Bilateral upper extremity supported, Reliant on assistive device for balance, During functional activity Standing balance-Leahy Scale: Poor Standing balance comment: pt is high fall risk    Cognition Arousal/Alertness: Awake/alert Behavior During Therapy: Flat affect Overall Cognitive Status: History of cognitive impairments - at baseline      General Comments: Pt is awake and coopertive but does not speak much throughout session. Baseline cognition deficits are present. Pt's  spouse hopefully for STR  here soon.               Pertinent Vitals/Pain Pain Assessment Pain Assessment: No/denies pain     PT Goals (current goals can now be found in the care plan section) Acute Rehab PT Goals Patient Stated Goal: none stated. Spouse present and hopeful for rehab bed soon    Frequency    Min 2X/week      PT Plan Current plan remains appropriate       AM-PAC PT "6 Clicks" Mobility   Outcome Measure  Help needed turning from your back to your side while in a flat bed without using bedrails?: A Lot Help needed moving from lying on your back to sitting on the side of a flat bed without using bedrails?: A Lot Help needed moving to and from a bed to a chair (including a wheelchair)?: A Lot Help needed standing up from a chair using your arms (e.g., wheelchair or bedside chair)?: A Lot Help needed to walk in hospital room?: A Little Help needed climbing 3-5 steps with a railing? : A Lot 6 Click Score: 13    End of Session Equipment Utilized During Treatment: Gait belt Activity Tolerance: Patient tolerated treatment well Patient left: in chair;with call bell/phone within reach;with family/visitor present Nurse Communication: Mobility status PT Visit Diagnosis: Unsteadiness on feet (R26.81);Difficulty in walking, not elsewhere classified (R26.2);Repeated falls (R29.6)     Time:  -     Charges:                        Julaine Fusi PTA 07/13/22, 2:36 PM

## 2022-07-13 NOTE — Progress Notes (Signed)
PROGRESS NOTE  Joshua Everett [id number] DOB: 03/23/1935   PCP: Ria Bush, MD  Patient is from: Home  DOA: 07/06/2022 LOS: 7  Chief complaints Chief Complaint  Patient presents with   Fall     Brief Narrative / Interim history: 88 year old M with PMH of Parkinson's disease, dementia, HTN and HLD admitted for multiple falls at home. He was found to have small nonocclusive PE on CTA of the chest, bilateral lower extremity Dopplers negative. He had been placed on IV heparin drip, after discussion with his wife given his high risk of recurrent falls, it was decided not to continue anticoagulation. Patient is currently stable, with no acute medical issues, awaiting subacute nursing facility placement.   Hospital course complicated by delirium that seems to be improving.  Likely discharge to SNF on 2/9  Subjective: Seen and examined earlier this morning.  No major events overnight of this morning.  No complaints but not a great historian.  He is only oriented to self.  Sitting on bedside chair.  Patient's wife at bedside.  Objective: Vitals:   07/11/22 1609 07/11/22 2241 07/12/22 2150 07/13/22 0833  BP: 125/71 (!) 176/82 (!) 153/98 135/73  Pulse: 82 70 66 (!) 50  Resp: '17 17 17 17  '$ Temp: 98.6 F (37 C) 98.1 F (36.7 C) 98 F (36.7 C) 98.1 F (36.7 C)  TempSrc:      SpO2: 97% 96% 94% 98%  Weight:      Height:        Examination:  GENERAL: No apparent distress.  Nontoxic. HEENT: MMM.  Vision and hearing grossly intact.  NECK: Supple.  No apparent JVD.  RESP:  No IWOB.  Fair aeration bilaterally. CVS:  RRR. Heart sounds normal.  ABD/GI/GU: BS+. Abd soft, NTND.  MSK/EXT:   No apparent deformity. Moves extremities. No edema.  SKIN: no apparent skin lesion or wound NEURO: Awake and alert.  Oriented to self.  Follows some commands.  No apparent focal neuro deficit. PSYCH: Calm. Normal affect.   Procedures:  None  Microbiology summarized: MPNTI-14, influenza  and RSV PCR nonreactive.  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 Suspected sacral alla fracture: Ruled out on CT pelvis. -Fall precaution. -Therapy recommended SNF.  Acute pulmonary embolism without cor pulmonale: CTA showed small subsegmental nonobstructive PE.  Initially started on heparin.  Anticoagulation discontinued after risk and benefit discussion between previous attending and patient's wife given patient's recurrent fall.   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 -Consider compression wears as tolerated. -OOB   Parkinson disease: Seems to be progressive per his neurologist. -Continue home Sinemet   Essential hypertension -Antihypertensive meds as above.   GERD (gastroesophageal reflux disease) -Continue PPI  Delirium/Parkinson dementia: Delirium improving. -Seroquel at night -Reorientation and delirium precaution.   Glaucoma -Continue home eyedrop   Obesity Body mass index is 30.18 kg/m.          DVT prophylaxis:  On subcu Lovenox.  Code Status: Full code Family Communication: Updated patient's wife at bedside Level of care: Telemetry Medical Status is: Inpatient Remains inpatient appropriate because: Delirium, generalized weakness and safe disposition   Final  disposition: SNF Consultants:  None  35 minutes with more than 50% spent in reviewing records, counseling patient/family and coordinating care.   Sch Meds:  Scheduled Meds:  aspirin  81 mg Oral Daily   carbidopa-levodopa  2 tablet Oral QID   enoxaparin (LOVENOX) injection  0.5 mg/kg  Subcutaneous Q24H   hydrochlorothiazide  6.25 mg Oral Daily   latanoprost  1 drop Both Eyes QHS   lisinopril  10 mg Oral BID   QUEtiapine  50 mg Oral QHS   timolol  1 drop Both Eyes Daily   Continuous Infusions: PRN Meds:.hydrALAZINE  Antimicrobials: Anti-infectives (From admission, onward)    None        I have personally reviewed the following labs and images: CBC: Recent Labs  Lab 07/06/22 1350 07/08/22 0459 07/09/22 0555 07/10/22 0301  WBC 8.1 6.3 7.2 7.1  NEUTROABS 5.0  --   --   --   HGB 14.6 14.9 14.3 14.3  HCT 44.1 43.5 42.4 42.5  MCV 90.6 87.9 88.9 89.5  PLT 172 172 182 181   BMP &GFR Recent Labs  Lab 07/06/22 1350 07/08/22 0459 07/09/22 0555 07/10/22 0301  NA 141 143 140 141  K 3.6 3.4* 3.5 3.6  CL 106 108 109 105  CO2 '25 28 26 28  '$ GLUCOSE 113* 119* 115* 135*  BUN '19 13 17 19  '$ CREATININE 0.91 0.83 0.74 0.84  CALCIUM 9.9 9.9 9.6 10.0   Estimated Creatinine Clearance: 74.6 mL/min (by C-G formula based on SCr of 0.84 mg/dL). Liver & Pancreas: Recent Labs  Lab 07/06/22 1350  AST 23  ALT 5  ALKPHOS 43  BILITOT 1.1  PROT 6.5  ALBUMIN 3.7   No results for input(s): "LIPASE", "AMYLASE" in the last 168 hours. No results for input(s): "AMMONIA" in the last 168 hours. Diabetic: No results for input(s): "HGBA1C" in the last 72 hours. Recent Labs  Lab 07/08/22 0835 07/12/22 0726  GLUCAP 101* 100*   Cardiac Enzymes: No results for input(s): "CKTOTAL", "CKMB", "CKMBINDEX", "TROPONINI" in the last 168 hours. No results for input(s): "PROBNP" in the last 8760 hours. Coagulation Profile: No results for input(s): "INR", "PROTIME" in the last 168 hours. Thyroid Function Tests: No results for input(s): "TSH", "T4TOTAL", "FREET4", "T3FREE", "THYROIDAB" in the last 72 hours. Lipid Profile: No results for input(s): "CHOL", "HDL", "LDLCALC", "TRIG", "CHOLHDL", "LDLDIRECT" in the last 72 hours. Anemia Panel: No results for input(s): "VITAMINB12",  "FOLATE", "FERRITIN", "TIBC", "IRON", "RETICCTPCT" in the last 72 hours. Urine analysis:    Component Value Date/Time   COLORURINE YELLOW (A) 07/06/2022 1721   APPEARANCEUR CLEAR (A) 07/06/2022 1721   LABSPEC 1.012 07/06/2022 1721   PHURINE 6.0 07/06/2022 1721   GLUCOSEU NEGATIVE 07/06/2022 1721   HGBUR NEGATIVE 07/06/2022 1721   BILIRUBINUR NEGATIVE 07/06/2022 1721   BILIRUBINUR negative 01/06/2020 1000   KETONESUR NEGATIVE 07/06/2022 1721   PROTEINUR NEGATIVE 07/06/2022 1721   UROBILINOGEN 1.0 01/06/2020 1000   NITRITE NEGATIVE 07/06/2022 1721   LEUKOCYTESUR NEGATIVE 07/06/2022 1721   Sepsis Labs: Invalid input(s): "PROCALCITONIN", "LACTICIDVEN"  Microbiology: Recent Results (from the past 240 hour(s))  Resp panel by RT-PCR (RSV, Flu A&B, Covid) Anterior Nasal Swab     Status: None   Collection Time: 07/06/22  2:38 PM   Specimen: Anterior Nasal Swab  Result Value Ref Range Status   SARS Coronavirus 2 by RT PCR NEGATIVE NEGATIVE Final    Comment: (NOTE) SARS-CoV-2 target nucleic acids are NOT DETECTED.  The SARS-CoV-2 RNA is generally  detectable in upper respiratory specimens during the acute phase of infection. The lowest concentration of SARS-CoV-2 viral copies this assay can detect is 138 copies/mL. A negative result does not preclude SARS-Cov-2 infection and should not be used as the sole basis for treatment or other patient management decisions. A negative result may occur with  improper specimen collection/handling, submission of specimen other than nasopharyngeal swab, presence of viral mutation(s) within the areas targeted by this assay, and inadequate number of viral copies(<138 copies/mL). A negative result must be combined with clinical observations, patient history, and epidemiological information. The expected result is Negative.  Fact Sheet for Patients:  EntrepreneurPulse.com.au  Fact Sheet for Healthcare Providers:   IncredibleEmployment.be  This test is no t yet approved or cleared by the [place] FDA and  has been authorized for detection and/or diagnosis of SARS-CoV-2 by FDA under an Emergency Use Authorization (EUA). This EUA will remain  in effect (meaning this test can be used) for the duration of the COVID-19 declaration under Section 564(b)(1) of the Act, 21 U.S.C.section 360bbb-3(b)(1), unless the authorization is terminated  or revoked sooner.       Influenza A by PCR NEGATIVE NEGATIVE Final   Influenza B by PCR NEGATIVE NEGATIVE Final    Comment: (NOTE) The Xpert Xpress SARS-CoV-2/FLU/RSV plus assay is intended as an aid in the diagnosis of influenza from Nasopharyngeal swab specimens and should not be used as a sole basis for treatment. Nasal washings and aspirates are unacceptable for Xpert Xpress SARS-CoV-2/FLU/RSV testing.  Fact Sheet for Patients: EntrepreneurPulse.com.au  Fact Sheet for Healthcare Providers: IncredibleEmployment.be  This test is not yet approved or cleared by the [place] FDA and has been authorized for detection and/or diagnosis of SARS-CoV-2 by FDA under an Emergency Use Authorization (EUA). This EUA will remain in effect (meaning this test can be used) for the duration of the COVID-19 declaration under Section 564(b)(1) of the Act, 21 U.S.C. section 360bbb-3(b)(1), unless the authorization is terminated or revoked.     Resp Syncytial Virus by PCR NEGATIVE NEGATIVE Final    Comment: (NOTE) Fact Sheet for Patients: EntrepreneurPulse.com.au  Fact Sheet for Healthcare Providers: IncredibleEmployment.be  This test is not yet approved or cleared by the [place] FDA and has been authorized for detection and/or diagnosis of SARS-CoV-2 by FDA under an Emergency Use Authorization (EUA). This EUA will remain in effect (meaning this test can be used) for  the duration of the COVID-19 declaration under Section 564(b)(1) of the Act, 21 U.S.C. section 360bbb-3(b)(1), unless the authorization is terminated or revoked.  Performed at [facility], [address] [place], [place] [address]     Radiology Studies: No results found.    Maheen Cwikla T. Benbrook  If 7PM-7AM, please contact night-coverage [email or web address] 07/13/2022, 1:13 PM

## 2022-07-13 NOTE — TOC Progression Note (Signed)
Transition of Care [facility]) - Progression Note    Patient Details  Name: Joshua Everett MRN: [id number] Date of Birth: Aug 30, 1934  Transition of Care [facility]) CM/SW [place], LCSW Phone Number: 07/13/2022, 1:35 PM  Clinical Narrative:  Updated wife.     Barriers to Discharge: Barriers Resolved  Expected Discharge Plan and Services         Expected Discharge Date: 07/09/22               DME Arranged: 3-N-1 DME Agency: [employer]       HH Arranged: RN, OT, PT, Nurse's Aide, Social Work [employer] Agency: Other - See comment (Patient active with an agency that she will call RN CM back about when she gets home. DC orders on in for all the listed services.)         Social Determinants of Health (SDOH) Interventions SDOH Screenings   Food Insecurity: No Food Insecurity (07/06/2022)  Housing: Low Risk  (07/06/2022)  Transportation Needs: No Transportation Needs (07/06/2022)  Utilities: Not At Risk (07/06/2022)  Alcohol Screen: Low Risk  (01/27/2022)  Depression (PHQ2-9): Medium Risk (06/19/2022)  Financial Resource Strain: Low Risk  (01/27/2022)  Physical Activity: Insufficiently Active (01/27/2022)  Social Connections: Moderately Isolated (01/27/2022)  Stress: No Stress Concern Present (01/27/2022)  Tobacco Use: Medium Risk (07/06/2022)    Readmission Risk Interventions     No data to display

## 2022-07-14 DIAGNOSIS — I2699 Other pulmonary embolism without acute cor pulmonale: Secondary | ICD-10-CM | POA: Diagnosis not present

## 2022-07-14 DIAGNOSIS — R4689 Other symptoms and signs involving appearance and behavior: Secondary | ICD-10-CM | POA: Diagnosis not present

## 2022-07-14 DIAGNOSIS — L719 Rosacea, unspecified: Secondary | ICD-10-CM | POA: Diagnosis not present

## 2022-07-14 DIAGNOSIS — R296 Repeated falls: Secondary | ICD-10-CM | POA: Diagnosis not present

## 2022-07-14 DIAGNOSIS — R55 Syncope and collapse: Secondary | ICD-10-CM | POA: Diagnosis not present

## 2022-07-14 DIAGNOSIS — D2371 Other benign neoplasm of skin of right lower limb, including hip: Secondary | ICD-10-CM | POA: Diagnosis not present

## 2022-07-14 DIAGNOSIS — S40022A Contusion of left upper arm, initial encounter: Secondary | ICD-10-CM | POA: Diagnosis not present

## 2022-07-14 DIAGNOSIS — Z87891 Personal history of nicotine dependence: Secondary | ICD-10-CM | POA: Diagnosis not present

## 2022-07-14 DIAGNOSIS — C4492 Squamous cell carcinoma of skin, unspecified: Secondary | ICD-10-CM | POA: Diagnosis not present

## 2022-07-14 DIAGNOSIS — R1312 Dysphagia, oropharyngeal phase: Secondary | ICD-10-CM | POA: Diagnosis not present

## 2022-07-14 DIAGNOSIS — Z86711 Personal history of pulmonary embolism: Secondary | ICD-10-CM | POA: Diagnosis not present

## 2022-07-14 DIAGNOSIS — F02811 Dementia in other diseases classified elsewhere, unspecified severity, with agitation: Secondary | ICD-10-CM | POA: Diagnosis not present

## 2022-07-14 DIAGNOSIS — I6523 Occlusion and stenosis of bilateral carotid arteries: Secondary | ICD-10-CM | POA: Diagnosis not present

## 2022-07-14 DIAGNOSIS — K219 Gastro-esophageal reflux disease without esophagitis: Secondary | ICD-10-CM

## 2022-07-14 DIAGNOSIS — I2693 Single subsegmental pulmonary embolism without acute cor pulmonale: Secondary | ICD-10-CM | POA: Diagnosis not present

## 2022-07-14 DIAGNOSIS — R2681 Unsteadiness on feet: Secondary | ICD-10-CM | POA: Diagnosis not present

## 2022-07-14 DIAGNOSIS — S51019A Laceration without foreign body of unspecified elbow, initial encounter: Secondary | ICD-10-CM | POA: Diagnosis not present

## 2022-07-14 DIAGNOSIS — G0430 Acute necrotizing hemorrhagic encephalopathy, unspecified: Secondary | ICD-10-CM | POA: Diagnosis not present

## 2022-07-14 DIAGNOSIS — R2689 Other abnormalities of gait and mobility: Secondary | ICD-10-CM | POA: Diagnosis not present

## 2022-07-14 DIAGNOSIS — Z7982 Long term (current) use of aspirin: Secondary | ICD-10-CM | POA: Diagnosis not present

## 2022-07-14 DIAGNOSIS — F0280 Dementia in other diseases classified elsewhere without behavioral disturbance: Secondary | ICD-10-CM | POA: Diagnosis not present

## 2022-07-14 DIAGNOSIS — M6281 Muscle weakness (generalized): Secondary | ICD-10-CM | POA: Diagnosis not present

## 2022-07-14 DIAGNOSIS — Z85828 Personal history of other malignant neoplasm of skin: Secondary | ICD-10-CM | POA: Diagnosis not present

## 2022-07-14 DIAGNOSIS — R2243 Localized swelling, mass and lump, lower limb, bilateral: Secondary | ICD-10-CM | POA: Diagnosis not present

## 2022-07-14 DIAGNOSIS — H409 Unspecified glaucoma: Secondary | ICD-10-CM | POA: Diagnosis not present

## 2022-07-14 DIAGNOSIS — R4182 Altered mental status, unspecified: Secondary | ICD-10-CM | POA: Diagnosis not present

## 2022-07-14 DIAGNOSIS — F03911 Unspecified dementia, unspecified severity, with agitation: Secondary | ICD-10-CM | POA: Diagnosis not present

## 2022-07-14 DIAGNOSIS — I639 Cerebral infarction, unspecified: Secondary | ICD-10-CM | POA: Diagnosis not present

## 2022-07-14 DIAGNOSIS — G928 Other toxic encephalopathy: Secondary | ICD-10-CM | POA: Diagnosis not present

## 2022-07-14 DIAGNOSIS — E669 Obesity, unspecified: Secondary | ICD-10-CM | POA: Diagnosis not present

## 2022-07-14 DIAGNOSIS — R569 Unspecified convulsions: Secondary | ICD-10-CM | POA: Diagnosis not present

## 2022-07-14 DIAGNOSIS — M19012 Primary osteoarthritis, left shoulder: Secondary | ICD-10-CM | POA: Diagnosis not present

## 2022-07-14 DIAGNOSIS — G903 Multi-system degeneration of the autonomic nervous system: Secondary | ICD-10-CM | POA: Diagnosis not present

## 2022-07-14 DIAGNOSIS — R41841 Cognitive communication deficit: Secondary | ICD-10-CM | POA: Diagnosis not present

## 2022-07-14 DIAGNOSIS — R0689 Other abnormalities of breathing: Secondary | ICD-10-CM | POA: Diagnosis not present

## 2022-07-14 DIAGNOSIS — C4491 Basal cell carcinoma of skin, unspecified: Secondary | ICD-10-CM | POA: Diagnosis not present

## 2022-07-14 DIAGNOSIS — I10 Essential (primary) hypertension: Secondary | ICD-10-CM | POA: Diagnosis not present

## 2022-07-14 DIAGNOSIS — G20A1 Parkinson's disease without dyskinesia, without mention of fluctuations: Secondary | ICD-10-CM | POA: Diagnosis not present

## 2022-07-14 DIAGNOSIS — L97819 Non-pressure chronic ulcer of other part of right lower leg with unspecified severity: Secondary | ICD-10-CM | POA: Diagnosis not present

## 2022-07-14 DIAGNOSIS — R5381 Other malaise: Secondary | ICD-10-CM | POA: Diagnosis not present

## 2022-07-14 DIAGNOSIS — Z79899 Other long term (current) drug therapy: Secondary | ICD-10-CM | POA: Diagnosis not present

## 2022-07-14 DIAGNOSIS — N3281 Overactive bladder: Secondary | ICD-10-CM | POA: Diagnosis not present

## 2022-07-14 DIAGNOSIS — I6502 Occlusion and stenosis of left vertebral artery: Secondary | ICD-10-CM | POA: Diagnosis not present

## 2022-07-14 DIAGNOSIS — S51012S Laceration without foreign body of left elbow, sequela: Secondary | ICD-10-CM | POA: Diagnosis not present

## 2022-07-14 DIAGNOSIS — G20C Parkinsonism, unspecified: Secondary | ICD-10-CM | POA: Diagnosis not present

## 2022-07-14 DIAGNOSIS — I700 Atherosclerosis of aorta: Secondary | ICD-10-CM | POA: Diagnosis not present

## 2022-07-14 DIAGNOSIS — N400 Enlarged prostate without lower urinary tract symptoms: Secondary | ICD-10-CM | POA: Diagnosis not present

## 2022-07-14 DIAGNOSIS — M25561 Pain in right knee: Secondary | ICD-10-CM | POA: Diagnosis not present

## 2022-07-14 DIAGNOSIS — R0902 Hypoxemia: Secondary | ICD-10-CM | POA: Diagnosis not present

## 2022-07-14 DIAGNOSIS — G9340 Encephalopathy, unspecified: Secondary | ICD-10-CM | POA: Diagnosis not present

## 2022-07-14 DIAGNOSIS — R404 Transient alteration of awareness: Secondary | ICD-10-CM | POA: Diagnosis not present

## 2022-07-14 DIAGNOSIS — I959 Hypotension, unspecified: Secondary | ICD-10-CM | POA: Diagnosis not present

## 2022-07-14 DIAGNOSIS — F02C2 Dementia in other diseases classified elsewhere, severe, with psychotic disturbance: Secondary | ICD-10-CM | POA: Diagnosis not present

## 2022-07-14 DIAGNOSIS — D485 Neoplasm of uncertain behavior of skin: Secondary | ICD-10-CM | POA: Diagnosis not present

## 2022-07-14 DIAGNOSIS — R410 Disorientation, unspecified: Secondary | ICD-10-CM | POA: Diagnosis not present

## 2022-07-14 DIAGNOSIS — Z1152 Encounter for screening for COVID-19: Secondary | ICD-10-CM | POA: Diagnosis not present

## 2022-07-14 DIAGNOSIS — G319 Degenerative disease of nervous system, unspecified: Secondary | ICD-10-CM | POA: Diagnosis not present

## 2022-07-14 DIAGNOSIS — K5904 Chronic idiopathic constipation: Secondary | ICD-10-CM | POA: Diagnosis not present

## 2022-07-14 MED ORDER — QUETIAPINE FUMARATE 50 MG PO TABS: 50.0000 mg | ORAL_TABLET | Freq: Every day | ORAL | Status: DC

## 2022-07-14 NOTE — Care Management Important Message (Signed)
Important Message  Patient Details  Name: Joshua Everett MRN: [id number] Date of Birth: [birth year]   Medicare Important Message Given:  Yes     Dannette Barbara 07/14/2022, 12:02 PM

## 2022-07-14 NOTE — TOC Transition Note (Signed)
Transition of Care [facility]) - CM/SW Discharge Note   Patient Details  Name: Joshua Everett MRN: [id number] Date of Birth: [birth year]  Transition of Care [facility]) CM/SW Contact:  Candie Chroman, LCSW Phone Number: 07/14/2022, 12:30 PM   Clinical Narrative:   Patient has orders to discharge to [facility] SNF today. RN has already called report. EMS, [facility], [employer], [facility], [address] [place] are unable to transport today. Patient walked 200 feet with PT yesterday. Discussed potential for wife to transport with PT and RN. Wife and son will pick him up around 2:00. SNF will bring out a wheelchair when they arrive. No further concerns. CSW signing off.  Final next level of care: Skilled Nursing Facility Barriers to Discharge: Barriers Resolved   Patient Goals and CMS Choice      Discharge Placement PASRR number recieved: 07/11/22 PASRR number recieved: 07/11/22            Patient chooses bed at: Other - please specify in the comment section below: [facility]) Patient to be transferred to facility by: Wife and son Name of family member notified: Nasir Cam Patient and family notified of of transfer: 07/14/22  Discharge Plan and Services Additional resources added to the After Visit Summary for                  DME Arranged: 3-N-1 DME Agency: [employer]       HH Arranged: RN, OT, PT, Nurse's Aide, Social Work [employer] Agency: Other - See comment (Patient active with an agency that she will call RN CM back about when she gets home. DC orders on in for all the listed services.)        Social Determinants of Health (SDOH) Interventions SDOH Screenings   Food Insecurity: No Food Insecurity (07/06/2022)  Housing: Low Risk  (07/06/2022)  Transportation Needs: No Transportation Needs (07/06/2022)  Utilities: Not At Risk (07/06/2022)  Alcohol Screen: Low Risk  (01/27/2022)  Depression (PHQ2-9):  Medium Risk (06/19/2022)  Financial Resource Strain: Low Risk  (01/27/2022)  Physical Activity: Insufficiently Active (01/27/2022)  Social Connections: Moderately Isolated (01/27/2022)  Stress: No Stress Concern Present (01/27/2022)  Tobacco Use: Medium Risk (07/06/2022)     Readmission Risk Interventions     No data to display

## 2022-07-14 NOTE — Progress Notes (Addendum)
Report called to Candace at [facility]. All belongings gathered. Wife and Son will transport patient to Facility by car.

## 2022-07-14 NOTE — Progress Notes (Signed)
PT Cancellation Note  Patient Details Name: Joshua Everett MRN: [id number] DOB: 1935/01/30   Cancelled Treatment:      PT treatment not completed 2/2 pt awaiting a ride to be D/C to the SNF. PT discussed discharge planning, rehab potential  and pt demonstrated good understanding of rehab at the SNF.    Joaquin Music PT DPT 3:10 PM,07/14/22

## 2022-07-14 NOTE — Plan of Care (Signed)
  Problem: Nutrition: Goal: Adequate nutrition will be maintained Outcome: Progressing   Problem: Coping: Goal: Level of anxiety will decrease Outcome: Progressing   Problem: Elimination: Goal: Will not experience complications related to bowel motility Outcome: Progressing   Problem: Clinical Measurements: Goal: Diagnostic test results will improve Outcome: Progressing

## 2022-07-14 NOTE — Discharge Summary (Signed)
Physician Discharge Summary  Joshua Everett [id number] DOB: 1935-02-26 DOA: 07/06/2022  PCP: Ria Bush, MD  Admit date: 07/06/2022 Discharge date: 07/14/2022 Admitted From: Home Disposition: SNF Recommendations for Outpatient Follow-up:  Check CMP and CBC in 1 week Reorientation and delirium precaution and fall precaution Ensure hydration and p.o. intake Recommend ongoing goals of care discussion or referral to palliative medicine Please follow up on the following pending results: None   Discharge Condition: Stable CODE STATUS: Full code  Contact information for follow-up providers     Ria Bush, MD. Go on 07/31/2022.   Specialty: Family Medicine Why: Appt @ 3:00 pm Contact information: [place] [address] [phone]         Vladimir Crofts, MD. Call.   Specialty: Neurology Contact information: [place] [facility] [place] [address] [phone]              Contact information for after-discharge care     Destination     [facility] .   Service: Skilled Nursing Contact information: [address] [place] [facility] course 88 year old M with PMH of Parkinson's disease, dementia, HTN and HLD admitted for multiple falls at home. He was found to have small nonocclusive PE on CTA of the chest, bilateral lower extremity Dopplers negative. He had been placed on IV heparin drip, after discussion with his wife given his high risk of recurrent falls, it was decided not to continue anticoagulation.  Hospital course complicated by delirium for which he required as needed Haldol and nightly Seroquel.  Eventually, delirium improved.  He is discharged on p.o. Seroquel 50 mg nightly.  Patient discharging to SNF per recommendation by therapy.  See individual problem list below for more.    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 Suspected sacral alla fracture: Ruled out on CT pelvis. -Fall precaution. -Therapy at SNF.   Acute pulmonary embolism without cor pulmonale: CTA showed small subsegmental nonobstructive PE.  Initially started on heparin.  Anticoagulation discontinued after risk and benefit discussion between previous attending and patient's wife given patient's recurrent fall.   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 -Consider compression wears as tolerated. -OOB, PT/OT   Parkinson disease: Seems to be progressive per his neurologist. -Continue home Sinemet   Essential hypertension -Antihypertensive meds as above.   GERD (gastroesophageal reflux disease) -Continue PPI   Delirium/Parkinson dementia: Delirium improving. -Seroquel at night -Reorientation and delirium precaution.   Glaucoma -Continue home eyedrop   Obesity Body mass index is 30.18 kg/m.            Vital signs Vitals:   07/13/22 1548 07/14/22 0008 07/14/22 0800 07/14/22 0919  BP: (!) 148/59 (!) 138/100 (!) 153/61 (!) 129/54  Pulse: 66 66 (!) 57 78  Temp: 97.9 F (36.6 C) 97.7 F (36.5 C) 97.9 F (36.6 C) 97.7 F (36.5 C)  Resp: 15 18  16  $ Height:      Weight:  SpO2: 99% 99% 98% 96%  TempSrc:   Oral   BMI (Calculated):         Discharge exam  GENERAL: No apparent distress.  Nontoxic. HEENT: MMM.  Vision and hearing grossly intact.  NECK: Supple.  No apparent JVD.  RESP:  No IWOB.  Fair aeration bilaterally. CVS:  RRR. Heart sounds normal.   ABD/GI/GU: BS+. Abd soft, NTND.  MSK/EXT:  Moves extremities. No apparent deformity. No edema.  SKIN: no apparent skin lesion or wound NEURO: Awake and alert. Oriented to self.  Follows commands.  No apparent focal neuro deficit. PSYCH: Calm. Normal affect.   Discharge Instructions  Allergies as of 07/14/2022       Reactions   Solifenacin Other (See Comments)   Hallucinations (per uro)        Medication List     STOP taking these medications    cycloSPORINE 0.05 % ophthalmic emulsion Commonly known as: RESTASIS   Fiber Complete Tabs       TAKE these medications    aspirin EC 81 MG tablet Take 81 mg by mouth daily. Swallow whole.   bimatoprost 0.01 % Soln Commonly known as: LUMIGAN Place 1 drop into both eyes at bedtime.   carbidopa-levodopa 25-100 MG tablet Commonly known as: SINEMET IR Take 2 tablets by mouth 4 (four) times daily.   cholecalciferol 1000 units tablet Commonly known as: VITAMIN D Take 1,000 Units by mouth daily.   gabapentin 100 MG capsule Commonly known as: NEURONTIN Take 100 mg by mouth at bedtime.   hydrochlorothiazide 12.5 MG tablet Commonly known as: HYDRODIURIL Take 0.5 tablets (6.25 mg total) by mouth daily.   lisinopril 10 MG tablet Commonly known as: ZESTRIL Take 1 tablet (10 mg total) by mouth 2 (two) times daily. What changed:  medication strength how much to take when to take this   MULTIVITAMIN ADULT PO Take 1 tablet by mouth daily.   polyethylene glycol powder 17 GM/SCOOP powder Commonly known as: GLYCOLAX/MIRALAX Take 17 g by mouth daily as needed for moderate constipation.   QUEtiapine 50 MG tablet Commonly known as: SEROquel Take 1 tablet (50 mg total) by mouth at bedtime.   sennosides-docusate sodium 8.6-50 MG tablet Commonly known as: SENOKOT-S Take 1 tablet by mouth daily.   timolol 0.5 % ophthalmic solution Commonly known as: TIMOPTIC Place 1 drop into both eyes daily.   VITAMIN B 12 PO Take by  mouth daily.               Durable Medical Equipment  (From admission, onward)           Start     Ordered   07/08/22 1331  For home use only DME 3 n 1  Once        07/08/22 1330            Consultations: None  Procedures/Studies:   ECHOCARDIOGRAM COMPLETE  Result Date: 07/08/2022    ECHOCARDIOGRAM REPORT   Patient Name:   Joshua Everett Hust Date of Exam: 07/08/2022 Medical Rec #:  [id number]          Height:       70.0 in Accession #:    [id number]         Weight:       210.3 lb Date of Birth:  [birth year]           BSA:          2.132 m Patient Age:    64 years  BP:           155/64 mmHg Patient Gender: M                  HR:           52 bpm. Exam Location:  [facility] Procedure: 2D Echo Indications:     Stroke I63.9  History:         Patient has no prior history of Echocardiogram examinations.                  Stroke.  Sonographer:     Wayland Salinas Referring Phys:  [place] Diagnosing Phys: Neoma Laming  Sonographer Comments: Technically difficult study due to poor echo windows, suboptimal parasternal window and suboptimal subcostal window. Image acquisition challenging due to patient body habitus and Image acquisition challenging due to respiratory motion. SSN not visualized IMPRESSIONS  1. Left ventricular ejection fraction, by estimation, is 50 to 55%. The left ventricle has low normal function. The left ventricle has no regional wall motion abnormalities. There is moderate concentric left ventricular hypertrophy. Left ventricular diastolic parameters are consistent with Grade I diastolic dysfunction (impaired relaxation).  2. Right ventricular systolic function is normal. The right ventricular size is normal.  3. Left atrial size was moderately dilated.  4. Right atrial size was mildly dilated.  5. The mitral valve is normal in structure. Trivial mitral valve regurgitation. No evidence of mitral stenosis.  6. The aortic valve is normal in structure. Aortic valve  regurgitation is not visualized. Aortic valve sclerosis is present, with no evidence of aortic valve stenosis.  7. The inferior vena cava is normal in size with greater than 50% respiratory variability, suggesting right atrial pressure of 3 mmHg. FINDINGS  Left Ventricle: Left ventricular ejection fraction, by estimation, is 50 to 55%. The left ventricle has low normal function. The left ventricle has no regional wall motion abnormalities. The left ventricular internal cavity size was normal in size. There is moderate concentric left ventricular hypertrophy. Left ventricular diastolic parameters are consistent with Grade I diastolic dysfunction (impaired relaxation). Right Ventricle: The right ventricular size is normal. No increase in right ventricular wall thickness. Right ventricular systolic function is normal. Left Atrium: Left atrial size was moderately dilated. Right Atrium: Right atrial size was mildly dilated. Pericardium: There is no evidence of pericardial effusion. Mitral Valve: The mitral valve is normal in structure. Trivial mitral valve regurgitation. No evidence of mitral valve stenosis. Tricuspid Valve: The tricuspid valve is normal in structure. Tricuspid valve regurgitation is trivial. No evidence of tricuspid stenosis. Aortic Valve: The aortic valve is normal in structure. Aortic valve regurgitation is not visualized. Aortic valve sclerosis is present, with no evidence of aortic valve stenosis. Aortic valve peak gradient measures 5.4 mmHg. Pulmonic Valve: The pulmonic valve was normal in structure. Pulmonic valve regurgitation is not visualized. No evidence of pulmonic stenosis. Aorta: The aortic root is normal in size and structure. Venous: The inferior vena cava is normal in size with greater than 50% respiratory variability, suggesting right atrial pressure of 3 mmHg. IAS/Shunts: No atrial level shunt detected by color flow Doppler.  LEFT VENTRICLE PLAX 2D LVIDd:         5.20 cm   Diastology  LVIDs:         3.70 cm   LV e' medial:    5.66 cm/s LV PW:         1.30 cm   LV E/e' medial:  13.7 LV  IVS:        1.30 cm   LV e' lateral:   6.96 cm/s LVOT diam:     2.30 cm   LV E/e' lateral: 11.1 LVOT Area:     4.15 cm  RIGHT VENTRICLE RV S prime:     16.40 cm/s TAPSE (M-mode): 3.2 cm LEFT ATRIUM             Index        RIGHT ATRIUM           Index LA diam:        4.20 cm 1.97 cm/m   RA Area:     19.60 cm LA Vol (A2C):   53.9 ml 25.28 ml/m  RA Volume:   49.40 ml  23.17 ml/m LA Vol (A4C):   84.6 ml 39.67 ml/m LA Biplane Vol: 66.7 ml 31.28 ml/m  AORTIC VALVE                 PULMONIC VALVE AV Area (Vmax): 3.78 cm     PV Vmax:        1.22 m/s AV Vmax:        116.50 cm/s  PV Peak grad:   6.0 mmHg AV Peak Grad:   5.4 mmHg     RVOT Peak grad: 2 mmHg LVOT Vmax:      106.00 cm/s  AORTA Ao Root diam: 3.70 cm MITRAL VALVE MV Area (PHT): 3.27 cm    SHUNTS MV Decel Time: 232 msec    Systemic Diam: 2.30 cm MV E velocity: 77.60 cm/s MV A velocity: 89.10 cm/s MV E/A ratio:  0.87 Shaukat Khan Electronically signed by Neoma Laming Signature Date/Time: 07/08/2022/12:29:15 PM    Final    EEG adult  Result Date: 07/07/2022 Derek Jack, MD     07/07/2022  6:48 PM Routine EEG Report Joshua Everett is a 88 y.o. male with a history of syncope who is undergoing an EEG to evaluate for seizures. Report: This EEG was acquired with electrodes placed according to the International 10-20 electrode system (including Fp1, Fp2, F3, F4, C3, C4, P3, P4, O1, O2, T3, T4, T5, T6, A1, A2, Fz, Cz, Pz). The following electrodes were missing or displaced: none. The occipital dominant rhythm was 5-6 Hz. This activity is reactive to stimulation. Drowsiness was manifested by background fragmentation; deeper stages of sleep were identified by K complexes and sleep spindles. There was no focal slowing. There were no interictal epileptiform discharges. There were no electrographic seizures identified. Photic stimulation and hyperventilation were  not performed. Impression and clinical correlation: This EEG was obtained while awake and asleep and is abnormal due to moderate diffuse slowing indicative of global cerebral dysfunction. Epileptiform abnormalities were not seen during this recording. [name], MD [facility] [phone] If 7pm- 7am, please page neurology on call as listed in [place].   CT PELVIS WO CONTRAST  Result Date: 07/07/2022 CLINICAL DATA:  Pelvic fracture. EXAM: CT PELVIS WITHOUT CONTRAST TECHNIQUE: Multidetector CT imaging of the pelvis was performed following the standard protocol without intravenous contrast. RADIATION DOSE REDUCTION: This exam was performed according to the departmental dose-optimization program which includes automated exposure control, adjustment of the mA and/or kV according to patient size and/or use of iterative reconstruction technique. COMPARISON:  July 06, 2022. FINDINGS: Urinary Tract: No definite abnormality seen involving the visualized ureters or bladder. Bowel:  Unremarkable visualized pelvic bowel loops. Vascular/Lymphatic: No pathologically enlarged lymph nodes. No significant vascular abnormality seen. Reproductive:  Prostate  is unremarkable. Other: Small fat containing left inguinal hernia. No ascites is noted. Musculoskeletal: No fracture is noted. IMPRESSION: No definite pelvic fracture is noted. Small fat containing left inguinal hernia. Electronically Signed   By: Marijo Conception M.D.   On: 07/07/2022 12:52   US Venous Img Lower Bilateral (DVT)  Result Date: 07/07/2022 CLINICAL DATA:  Pulmonary embolism.  Evaluate for DVT. EXAM: BILATERAL LOWER EXTREMITY VENOUS DOPPLER ULTRASOUND TECHNIQUE: Gray-scale sonography with graded compression, as well as color Doppler and duplex ultrasound were performed to evaluate the lower extremity deep venous systems from the level of the common femoral vein and including the common femoral, femoral, profunda femoral, popliteal and calf veins  including the posterior tibial, peroneal and gastrocnemius veins when visible. The superficial great saphenous vein was also interrogated. Spectral Doppler was utilized to evaluate flow at rest and with distal augmentation maneuvers in the common femoral, femoral and popliteal veins. COMPARISON:  None Available. FINDINGS: RIGHT LOWER EXTREMITY Common Femoral Vein: No evidence of thrombus. Normal compressibility, respiratory phasicity and response to augmentation. Saphenofemoral Junction: No evidence of thrombus. Normal compressibility and flow on color Doppler imaging. Profunda Femoral Vein: No evidence of thrombus. Normal compressibility and flow on color Doppler imaging. Femoral Vein: No evidence of thrombus. Normal compressibility, respiratory phasicity and response to augmentation. Popliteal Vein: No evidence of thrombus. Normal compressibility, respiratory phasicity and response to augmentation. Calf Veins: No evidence of thrombus. Normal compressibility and flow on color Doppler imaging. Superficial Great Saphenous Vein: No evidence of thrombus. Normal compressibility. Other Findings:  None. LEFT LOWER EXTREMITY Common Femoral Vein: No evidence of thrombus. Normal compressibility, respiratory phasicity and response to augmentation. Saphenofemoral Junction: No evidence of thrombus. Normal compressibility and flow on color Doppler imaging. Profunda Femoral Vein: No evidence of thrombus. Normal compressibility and flow on color Doppler imaging. Femoral Vein: No evidence of thrombus. Normal compressibility, respiratory phasicity and response to augmentation. Popliteal Vein: No evidence of thrombus. Normal compressibility, respiratory phasicity and response to augmentation. Calf Veins: No evidence of thrombus. Normal compressibility and flow on color Doppler imaging. Superficial Great Saphenous Vein: No evidence of thrombus. Normal compressibility. Other Findings:  None. IMPRESSION: No evidence of DVT within either  lower extremity. Electronically Signed   By: Sandi Mariscal M.D.   On: 07/07/2022 12:34   CT Angio Chest Pulmonary Embolism (PE) W or WO Contrast  Result Date: 07/07/2022 CLINICAL DATA:  Fall, elevated D-dimer, evaluate for PE EXAM: CT ANGIOGRAPHY CHEST WITH CONTRAST TECHNIQUE: Multidetector CT imaging of the chest was performed using the standard protocol during bolus administration of intravenous contrast. Multiplanar CT image reconstructions and MIPs were obtained to evaluate the vascular anatomy. RADIATION DOSE REDUCTION: This exam was performed according to the departmental dose-optimization program which includes automated exposure control, adjustment of the mA and/or kV according to patient size and/or use of iterative reconstruction technique. CONTRAST:  31m OMNIPAQUE IOHEXOL 350 MG/ML SOLN COMPARISON:  Chest radiograph dated 05/04/2022 FINDINGS: Cardiovascular: Satisfactory opacification of the bilateral pulmonary arteries to the segmental level. Evaluation is constrained due to motion degradation. Within that constraint, there is isolated segmental pulmonary embolism in the right lower lobe pulmonary artery (series 5/image 164). Overall clot burden is very small. Although not tailored for evaluation of the thoracic aorta, there is no evidence thoracic aortic aneurysm or dissection. Mild atherosclerotic calcifications of the arch. Severe three-coronary atherosclerosis, lad predominant. Mediastinum/Nodes: 1.7 cm short axis right paratracheal node (series 5/image 78). Visualized thyroid is unremarkable. Lungs/Pleura: Evaluation lung parenchyma is constrained  by respiratory motion. Within that constraint, there are no suspicious pulmonary nodules. No focal consolidation. No pleural effusion or pneumothorax. Upper Abdomen: Visualized upper abdomen is notable for cholelithiasis, vascular calcifications, and a central hepatic cyst. Musculoskeletal: Degenerative changes of the visualized thoracolumbar spine.  Review of the MIP images confirms the above findings. IMPRESSION: Isolated segmental pulmonary embolism in the right lower lobe pulmonary artery. Overall clot burden is very small. 1.7 cm short axis right paratracheal node, nonspecific. Consider follow-up CT chest in 3 months. These results will be called to the ordering clinician or representative by the Radiologist Assistant, and communication documented in the PACS or [employer]. Aortic Atherosclerosis (ICD10-I70.0). Electronically Signed   By: Julian Hy M.D.   On: 07/07/2022 03:26   MR BRAIN WO CONTRAST  Result Date: 07/06/2022 CLINICAL DATA:  Altered mental status EXAM: MRI HEAD WITHOUT CONTRAST TECHNIQUE: Multiplanar, multiecho pulse sequences of the brain and surrounding structures were obtained without intravenous contrast. COMPARISON:  06/15/2020 FINDINGS: Brain: No acute infarct, mass effect or extra-axial collection. Fewer than 5 scattered microhemorrhages in a nonspecific pattern. There is multifocal hyperintense T2-weighted signal within the white matter. Generalized volume loss. 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 The midline structures are normal. Vascular: Normal flow voids. Skull and upper cervical spine: Normal marrow signal. Sinuses/Orbits: Negative. Other: None. IMPRESSION: 1. No acute intracranial abnormality. 2. 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 Electronically Signed   By: Ulyses Jarred M.D.   On: 07/06/2022 23:16   DG Hip Unilat W or Wo Pelvis 2-3 Views Right  Result Date: 07/06/2022 CLINICAL DATA:  right leg pain, fall EXAM: DG HIP (WITH OR WITHOUT PELVIS) 2-3V RIGHT COMPARISON:  None Available. FINDINGS: There is no evidence of acute right hip fracture. There is a possible fracture of the right sacral ilia. 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 Degenerative changes of the right SI joint and the pubic symphysis. IMPRESSION:  Possible fracture of the right sacral ala. Consider CT of the pelvis. No evidence of acute right hip fracture. Electronically Signed   By: Maurine Simmering M.D.   On: 07/06/2022 18:02   CT Head Wo Contrast  Result Date: 07/06/2022 CLINICAL DATA:  Head trauma, minor. Four falls since yesterday. Neck trauma. EXAM: CT HEAD WITHOUT CONTRAST CT CERVICAL SPINE WITHOUT CONTRAST TECHNIQUE: Multidetector CT imaging of the head and cervical spine was performed following the standard protocol without intravenous contrast. Multiplanar CT image reconstructions of the cervical spine were also generated. RADIATION DOSE REDUCTION: This exam was performed according to the departmental dose-optimization program which includes automated exposure control, adjustment of the mA and/or kV according to patient size and/or use of iterative reconstruction technique. COMPARISON:  CT head and cervical spine 05/03/2022. FINDINGS: CT HEAD FINDINGS Brain: No acute hemorrhage. Unchanged chronic small-vessel disease. 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 Old lacunar infarct in the right cerebellar hemisphere. Cortical gray-white differentiation is otherwise preserved. Prominence of the ventricles and sulci within normal limits for age. No extra-axial collection. Basilar cisterns are patent. Vascular: No hyperdense vessel or unexpected calcification. Skull: No calvarial fracture or suspicious bone lesion. Skull base is unremarkable. Sinuses/Orbits: Paranasal sinuses, mastoid air cells, and middle ear cavities are well aerated. Orbits are unremarkable. Other: No scalp hematoma. CT CERVICAL SPINE FINDINGS Alignment: 2 mm anterolisthesis of C4 on C5. Degenerative reversal of the normal cervical lordosis. Skull base and vertebrae: No acute fracture. Normal craniocervical junction. No suspicious bone  lesions. Soft tissues and spinal canal: No prevertebral fluid or swelling. No visible canal hematoma. Disc levels:  No high-grade spinal  canal stenosis. Upper chest: Unremarkable. Other: None. IMPRESSION: 1. No acute intracranial abnormality. 2. No acute cervical spine fracture or traumatic malalignment. Electronically Signed   By: Emmit Alexanders M.D.   On: 07/06/2022 15:25   CT Cervical Spine Wo Contrast  Result Date: 07/06/2022 CLINICAL DATA:  Head trauma, minor. Four falls since yesterday. Neck trauma. EXAM: CT HEAD WITHOUT CONTRAST CT CERVICAL SPINE WITHOUT CONTRAST TECHNIQUE: Multidetector CT imaging of the head and cervical spine was performed following the standard protocol without intravenous contrast. Multiplanar CT image reconstructions of the cervical spine were also generated. RADIATION DOSE REDUCTION: This exam was performed according to the departmental dose-optimization program which includes automated exposure control, adjustment of the mA and/or kV according to patient size and/or use of iterative reconstruction technique. COMPARISON:  CT head and cervical spine 05/03/2022. FINDINGS: CT HEAD FINDINGS Brain: No acute hemorrhage. Unchanged chronic small-vessel disease. 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 Old lacunar infarct in the right cerebellar hemisphere. Cortical gray-white differentiation is otherwise preserved. Prominence of the ventricles and sulci within normal limits for age. No extra-axial collection. Basilar cisterns are patent. Vascular: No hyperdense vessel or unexpected calcification. Skull: No calvarial fracture or suspicious bone lesion. Skull base is unremarkable. Sinuses/Orbits: Paranasal sinuses, mastoid air cells, and middle ear cavities are well aerated. Orbits are unremarkable. Other: No scalp hematoma. CT CERVICAL SPINE FINDINGS Alignment: 2 mm anterolisthesis of C4 on C5. Degenerative reversal of the normal cervical lordosis. Skull base and vertebrae: No acute fracture. Normal craniocervical junction. No suspicious bone lesions. Soft tissues and spinal canal: No prevertebral fluid or  swelling. No visible canal hematoma. Disc levels:  No high-grade spinal canal stenosis. Upper chest: Unremarkable. Other: None. IMPRESSION: 1. No acute intracranial abnormality. 2. No acute cervical spine fracture or traumatic malalignment. Electronically Signed   By: Emmit Alexanders M.D.   On: 07/06/2022 15:25       The results of significant diagnostics from this hospitalization (including imaging, microbiology, ancillary and laboratory) are listed below for reference.     Microbiology: Recent Results (from the past 240 hour(s))  Resp panel by RT-PCR (RSV, Flu A&B, Covid) Anterior Nasal Swab     Status: None   Collection Time: 07/06/22  2:38 PM   Specimen: Anterior Nasal Swab  Result Value Ref Range Status   SARS Coronavirus 2 by RT PCR NEGATIVE NEGATIVE Final    Comment: (NOTE) SARS-CoV-2 target nucleic acids are NOT DETECTED.  The SARS-CoV-2 RNA is generally detectable in upper respiratory specimens during the acute phase of infection. The lowest concentration of SARS-CoV-2 viral copies this assay can detect is 138 copies/mL. A negative result does not preclude SARS-Cov-2 infection and should not be used as the sole basis for treatment or other patient management decisions. A negative result may occur with  improper specimen collection/handling, submission of specimen other than nasopharyngeal swab, presence of viral mutation(s) within the areas targeted by this assay, and inadequate number of viral copies(<138 copies/mL). A negative result must be combined with clinical observations, patient history, and epidemiological information. The expected result is Negative.  Fact Sheet for Patients:  EntrepreneurPulse.com.au  Fact Sheet for Healthcare Providers:  IncredibleEmployment.be  This test is no t yet approved or cleared by the [place] FDA and  has been authorized for detection and/or diagnosis of SARS-CoV-2 by FDA under an Emergency  Use  Authorization (EUA). This EUA will remain  in effect (meaning this test can be used) for the duration of the COVID-19 declaration under Section 564(b)(1) of the Act, 21 U.S.C.section 360bbb-3(b)(1), unless the authorization is terminated  or revoked sooner.       Influenza A by PCR NEGATIVE NEGATIVE Final   Influenza B by PCR NEGATIVE NEGATIVE Final    Comment: (NOTE) The Xpert Xpress SARS-CoV-2/FLU/RSV plus assay is intended as an aid in the diagnosis of influenza from Nasopharyngeal swab specimens and should not be used as a sole basis for treatment. Nasal washings and aspirates are unacceptable for Xpert Xpress SARS-CoV-2/FLU/RSV testing.  Fact Sheet for Patients: EntrepreneurPulse.com.au  Fact Sheet for Healthcare Providers: IncredibleEmployment.be  This test is not yet approved or cleared by the [place] FDA and has been authorized for detection and/or diagnosis of SARS-CoV-2 by FDA under an Emergency Use Authorization (EUA). This EUA will remain in effect (meaning this test can be used) for the duration of the COVID-19 declaration under Section 564(b)(1) of the Act, 21 U.S.C. section 360bbb-3(b)(1), unless the authorization is terminated or revoked.     Resp Syncytial Virus by PCR NEGATIVE NEGATIVE Final    Comment: (NOTE) Fact Sheet for Patients: EntrepreneurPulse.com.au  Fact Sheet for Healthcare Providers: IncredibleEmployment.be  This test is not yet approved or cleared by the [place] FDA and has been authorized for detection and/or diagnosis of SARS-CoV-2 by FDA under an Emergency Use Authorization (EUA). This EUA will remain in effect (meaning this test can be used) for the duration of the COVID-19 declaration under Section 564(b)(1) of the Act, 21 U.S.C. section 360bbb-3(b)(1), unless the authorization is terminated or revoked.  Performed at [facility], [address] [place], [place] [address]      Labs:  CBC: Recent Labs  Lab 07/08/22 0459 07/09/22 0555 07/10/22 0301  WBC 6.3 7.2 7.1  HGB 14.9 14.3 14.3  HCT 43.5 42.4 42.5  MCV 87.9 88.9 89.5  PLT 172 182 181   BMP &GFR Recent Labs  Lab 07/08/22 0459 07/09/22 0555 07/10/22 0301  NA 143 140 141  K 3.4* 3.5 3.6  CL 108 109 105  CO2 28 26 28  $ GLUCOSE 119* 115* 135*  BUN 13 17 19  $ CREATININE 0.83 0.74 0.84  CALCIUM 9.9 9.6 10.0   Estimated Creatinine Clearance: 74.6 mL/min (by C-G formula based on SCr of 0.84 mg/dL). Liver & Pancreas: No results for input(s): "AST", "ALT", "ALKPHOS", "BILITOT", "PROT", "ALBUMIN" in the last 168 hours. No results for input(s): "LIPASE", "AMYLASE" in the last 168 hours. No results for input(s): "AMMONIA" in the last 168 hours. Diabetic: No results for input(s): "HGBA1C" in the last 72 hours. Recent Labs  Lab 07/08/22 0835 07/12/22 0726  GLUCAP 101* 100*   Cardiac Enzymes: No results for input(s): "CKTOTAL", "CKMB", "CKMBINDEX", "TROPONINI" in the last 168 hours. No results for input(s): "PROBNP" in the last 8760 hours. Coagulation Profile: No results for input(s): "INR", "PROTIME" in the last 168 hours. Thyroid Function Tests: No results for input(s): "TSH", "T4TOTAL", "FREET4", "T3FREE", "THYROIDAB" in the last 72 hours. Lipid Profile: No results for input(s): "CHOL", "HDL", "LDLCALC", "TRIG", "CHOLHDL", "LDLDIRECT" in the last 72 hours. Anemia Panel: No results for input(s): "VITAMINB12", "FOLATE", "FERRITIN", "TIBC", "IRON", "RETICCTPCT" in the last 72 hours. Urine analysis:    Component Value Date/Time   COLORURINE YELLOW (A) 07/06/2022 1721   APPEARANCEUR CLEAR (A) 07/06/2022 1721   LABSPEC 1.012 07/06/2022 1721   PHURINE 6.0 07/06/2022  [place] 07/06/2022 [place] 07/06/2022 1721   BILIRUBINUR NEGATIVE 07/06/2022 1721   BILIRUBINUR negative 01/06/2020 1000   KETONESUR NEGATIVE  07/06/2022 1721   PROTEINUR NEGATIVE 07/06/2022 1721   UROBILINOGEN 1.0 01/06/2020 1000   NITRITE NEGATIVE 07/06/2022 1721   LEUKOCYTESUR NEGATIVE 07/06/2022 1721   Sepsis Labs: Invalid input(s): "PROCALCITONIN", "LACTICIDVEN"   SIGNED:  Mercy Riding, MD  [facility] 07/14/2022, 10:52 AM

## 2022-07-17 DIAGNOSIS — L97819 Non-pressure chronic ulcer of other part of right lower leg with unspecified severity: Secondary | ICD-10-CM | POA: Diagnosis not present

## 2022-07-17 DIAGNOSIS — K5904 Chronic idiopathic constipation: Secondary | ICD-10-CM | POA: Diagnosis not present

## 2022-07-17 DIAGNOSIS — G20A1 Parkinson's disease without dyskinesia, without mention of fluctuations: Secondary | ICD-10-CM | POA: Diagnosis not present

## 2022-07-17 DIAGNOSIS — N400 Enlarged prostate without lower urinary tract symptoms: Secondary | ICD-10-CM | POA: Diagnosis not present

## 2022-07-17 DIAGNOSIS — F02811 Dementia in other diseases classified elsewhere, unspecified severity, with agitation: Secondary | ICD-10-CM | POA: Diagnosis not present

## 2022-07-17 DIAGNOSIS — N3281 Overactive bladder: Secondary | ICD-10-CM | POA: Diagnosis not present

## 2022-07-17 DIAGNOSIS — R4182 Altered mental status, unspecified: Secondary | ICD-10-CM | POA: Diagnosis not present

## 2022-07-17 DIAGNOSIS — I10 Essential (primary) hypertension: Secondary | ICD-10-CM | POA: Diagnosis not present

## 2022-07-17 DIAGNOSIS — G20C Parkinsonism, unspecified: Secondary | ICD-10-CM | POA: Diagnosis not present

## 2022-07-17 DIAGNOSIS — C4491 Basal cell carcinoma of skin, unspecified: Secondary | ICD-10-CM | POA: Diagnosis not present

## 2022-07-17 DIAGNOSIS — F02C2 Dementia in other diseases classified elsewhere, severe, with psychotic disturbance: Secondary | ICD-10-CM | POA: Diagnosis not present

## 2022-07-17 DIAGNOSIS — R5381 Other malaise: Secondary | ICD-10-CM | POA: Diagnosis not present

## 2022-07-17 DIAGNOSIS — K219 Gastro-esophageal reflux disease without esophagitis: Secondary | ICD-10-CM | POA: Diagnosis not present

## 2022-07-18 DIAGNOSIS — R4689 Other symptoms and signs involving appearance and behavior: Secondary | ICD-10-CM | POA: Diagnosis not present

## 2022-07-18 DIAGNOSIS — F03911 Unspecified dementia, unspecified severity, with agitation: Secondary | ICD-10-CM | POA: Diagnosis not present

## 2022-07-18 DIAGNOSIS — F02811 Dementia in other diseases classified elsewhere, unspecified severity, with agitation: Secondary | ICD-10-CM | POA: Diagnosis not present

## 2022-07-19 DIAGNOSIS — R2681 Unsteadiness on feet: Secondary | ICD-10-CM | POA: Diagnosis not present

## 2022-07-19 DIAGNOSIS — R296 Repeated falls: Secondary | ICD-10-CM | POA: Diagnosis not present

## 2022-07-19 DIAGNOSIS — D485 Neoplasm of uncertain behavior of skin: Secondary | ICD-10-CM | POA: Diagnosis not present

## 2022-07-25 DIAGNOSIS — R5381 Other malaise: Secondary | ICD-10-CM | POA: Diagnosis not present

## 2022-07-25 DIAGNOSIS — G20A1 Parkinson's disease without dyskinesia, without mention of fluctuations: Secondary | ICD-10-CM | POA: Diagnosis not present

## 2022-07-26 ENCOUNTER — Other Ambulatory Visit: Payer: Self-pay

## 2022-07-26 ENCOUNTER — Emergency Department (HOSPITAL_COMMUNITY): Payer: Medicare Other

## 2022-07-26 ENCOUNTER — Observation Stay (HOSPITAL_COMMUNITY): Payer: Medicare Other

## 2022-07-26 ENCOUNTER — Observation Stay (HOSPITAL_COMMUNITY)
Admission: EM | Admit: 2022-07-26 | Discharge: 2022-07-28 | Disposition: A | Discharge status: Home / Self Care | Payer: Medicare Other | Attending: Internal Medicine | Admitting: Internal Medicine

## 2022-07-26 DIAGNOSIS — I700 Atherosclerosis of aorta: Secondary | ICD-10-CM | POA: Diagnosis not present

## 2022-07-26 DIAGNOSIS — G319 Degenerative disease of nervous system, unspecified: Secondary | ICD-10-CM | POA: Diagnosis not present

## 2022-07-26 DIAGNOSIS — Z85828 Personal history of other malignant neoplasm of skin: Secondary | ICD-10-CM | POA: Insufficient documentation

## 2022-07-26 DIAGNOSIS — R404 Transient alteration of awareness: Secondary | ICD-10-CM | POA: Diagnosis not present

## 2022-07-26 DIAGNOSIS — Z1152 Encounter for screening for COVID-19: Secondary | ICD-10-CM | POA: Diagnosis not present

## 2022-07-26 DIAGNOSIS — I10 Essential (primary) hypertension: Secondary | ICD-10-CM | POA: Diagnosis not present

## 2022-07-26 DIAGNOSIS — G20A1 Parkinson's disease without dyskinesia, without mention of fluctuations: Secondary | ICD-10-CM | POA: Diagnosis present

## 2022-07-26 DIAGNOSIS — G9340 Encephalopathy, unspecified: Secondary | ICD-10-CM | POA: Diagnosis not present

## 2022-07-26 DIAGNOSIS — N400 Enlarged prostate without lower urinary tract symptoms: Secondary | ICD-10-CM | POA: Diagnosis present

## 2022-07-26 DIAGNOSIS — I2699 Other pulmonary embolism without acute cor pulmonale: Secondary | ICD-10-CM | POA: Diagnosis present

## 2022-07-26 DIAGNOSIS — Z7982 Long term (current) use of aspirin: Secondary | ICD-10-CM | POA: Insufficient documentation

## 2022-07-26 DIAGNOSIS — R0902 Hypoxemia: Secondary | ICD-10-CM | POA: Diagnosis not present

## 2022-07-26 DIAGNOSIS — Z79899 Other long term (current) drug therapy: Secondary | ICD-10-CM | POA: Insufficient documentation

## 2022-07-26 DIAGNOSIS — G928 Other toxic encephalopathy: Secondary | ICD-10-CM

## 2022-07-26 DIAGNOSIS — R569 Unspecified convulsions: Secondary | ICD-10-CM | POA: Diagnosis not present

## 2022-07-26 DIAGNOSIS — R262 Difficulty in walking, not elsewhere classified: Secondary | ICD-10-CM

## 2022-07-26 DIAGNOSIS — R410 Disorientation, unspecified: Secondary | ICD-10-CM | POA: Diagnosis not present

## 2022-07-26 DIAGNOSIS — G20C Parkinsonism, unspecified: Secondary | ICD-10-CM | POA: Insufficient documentation

## 2022-07-26 DIAGNOSIS — R4182 Altered mental status, unspecified: Secondary | ICD-10-CM | POA: Diagnosis not present

## 2022-07-26 DIAGNOSIS — G0430 Acute necrotizing hemorrhagic encephalopathy, unspecified: Principal | ICD-10-CM | POA: Insufficient documentation

## 2022-07-26 DIAGNOSIS — Z86711 Personal history of pulmonary embolism: Secondary | ICD-10-CM | POA: Insufficient documentation

## 2022-07-26 DIAGNOSIS — Z87891 Personal history of nicotine dependence: Secondary | ICD-10-CM | POA: Insufficient documentation

## 2022-07-26 DIAGNOSIS — I6523 Occlusion and stenosis of bilateral carotid arteries: Secondary | ICD-10-CM | POA: Diagnosis not present

## 2022-07-26 DIAGNOSIS — I6502 Occlusion and stenosis of left vertebral artery: Secondary | ICD-10-CM | POA: Insufficient documentation

## 2022-07-26 DIAGNOSIS — R55 Syncope and collapse: Secondary | ICD-10-CM | POA: Diagnosis not present

## 2022-07-26 DIAGNOSIS — M19012 Primary osteoarthritis, left shoulder: Secondary | ICD-10-CM | POA: Diagnosis not present

## 2022-07-26 DIAGNOSIS — I639 Cerebral infarction, unspecified: Secondary | ICD-10-CM | POA: Diagnosis not present

## 2022-07-26 DIAGNOSIS — R296 Repeated falls: Secondary | ICD-10-CM

## 2022-07-26 DIAGNOSIS — S40022A Contusion of left upper arm, initial encounter: Secondary | ICD-10-CM | POA: Diagnosis not present

## 2022-07-26 DIAGNOSIS — R0689 Other abnormalities of breathing: Secondary | ICD-10-CM | POA: Diagnosis not present

## 2022-07-26 LAB — COMPREHENSIVE METABOLIC PANEL
ALT: 7 U/L (ref 0–44)
AST: 58 U/L — ABNORMAL HIGH (ref 15–41)
Albumin: 2.9 g/dL — ABNORMAL LOW (ref 3.5–5.0)
Alkaline Phosphatase: 43 U/L (ref 38–126)
Anion gap: 6 (ref 5–15)
BUN: 34 mg/dL — ABNORMAL HIGH (ref 8–23)
CO2: 27 mmol/L (ref 22–32)
Calcium: 8.7 mg/dL — ABNORMAL LOW (ref 8.9–10.3)
Chloride: 104 mmol/L (ref 98–111)
Creatinine, Ser: 1.2 mg/dL (ref 0.61–1.24)
GFR, Estimated: 59 mL/min — ABNORMAL LOW (ref >60)
Glucose, Bld: 109 mg/dL — ABNORMAL HIGH (ref 70–99)
Potassium: 3.8 mmol/L (ref 3.5–5.1)
Sodium: 137 mmol/L (ref 135–145)
Total Bilirubin: 0.7 mg/dL (ref 0.3–1.2)
Total Protein: 5.2 g/dL — ABNORMAL LOW (ref 6.5–8.1)

## 2022-07-26 LAB — RAPID URINE DRUG SCREEN, HOSP PERFORMED
Amphetamines: NOT DETECTED
Barbiturates: NOT DETECTED
Benzodiazepines: NOT DETECTED
Cocaine: NOT DETECTED
Opiates: NOT DETECTED
Tetrahydrocannabinol: NOT DETECTED

## 2022-07-26 LAB — DIFFERENTIAL
Abs Immature Granulocytes: 0.02 10*3/uL (ref 0.00–0.07)
Basophils Absolute: 0 10*3/uL (ref 0.0–0.1)
Basophils Relative: 1 %
Eosinophils Absolute: 0.2 10*3/uL (ref 0.0–0.5)
Eosinophils Relative: 4 %
Immature Granulocytes: 0 %
Lymphocytes Relative: 20 %
Lymphs Abs: 1.2 10*3/uL (ref 0.7–4.0)
Monocytes Absolute: 0.5 10*3/uL (ref 0.1–1.0)
Monocytes Relative: 8 %
Neutro Abs: 4.1 10*3/uL (ref 1.7–7.7)
Neutrophils Relative %: 67 %

## 2022-07-26 LAB — CBC
HCT: 37.4 % — ABNORMAL LOW (ref 39.0–52.0)
Hemoglobin: 12 g/dL — ABNORMAL LOW (ref 13.0–17.0)
MCH: 30.2 pg (ref 26.0–34.0)
MCHC: 32.1 g/dL (ref 30.0–36.0)
MCV: 94.2 fL (ref 80.0–100.0)
Platelets: 161 10*3/uL (ref 150–400)
RBC: 3.97 MIL/uL — ABNORMAL LOW (ref 4.22–5.81)
RDW: 12.4 % (ref 11.5–15.5)
WBC: 6.1 10*3/uL (ref 4.0–10.5)
nRBC: 0 % (ref 0.0–0.2)

## 2022-07-26 LAB — URINALYSIS, ROUTINE W REFLEX MICROSCOPIC
Bilirubin Urine: NEGATIVE
Glucose, UA: NEGATIVE mg/dL
Hgb urine dipstick: NEGATIVE
Ketones, ur: 5 mg/dL — AB
Leukocytes,Ua: NEGATIVE
Nitrite: NEGATIVE
Protein, ur: NEGATIVE mg/dL
Specific Gravity, Urine: 1.019 (ref 1.005–1.030)
pH: 5 (ref 5.0–8.0)

## 2022-07-26 LAB — PROTIME-INR
INR: 1.1 (ref 0.8–1.2)
Prothrombin Time: 14.4 seconds (ref 11.4–15.2)

## 2022-07-26 LAB — HEMOGLOBIN A1C
Hgb A1c MFr Bld: 5 % (ref 4.8–5.6)
Mean Plasma Glucose: 96.8 mg/dL

## 2022-07-26 LAB — CBG MONITORING, ED: Glucose-Capillary: 124 mg/dL — ABNORMAL HIGH (ref 70–99)

## 2022-07-26 LAB — RESP PANEL BY RT-PCR (RSV, FLU A&B, COVID)  RVPGX2
Influenza A by PCR: NEGATIVE
Influenza B by PCR: NEGATIVE
Resp Syncytial Virus by PCR: NEGATIVE
SARS Coronavirus 2 by RT PCR: NEGATIVE

## 2022-07-26 LAB — TSH: TSH: 2.911 u[IU]/mL (ref 0.350–4.500)

## 2022-07-26 LAB — ETHANOL: Alcohol, Ethyl (B): <10 mg/dL (ref <10)

## 2022-07-26 LAB — APTT: aPTT: 29 seconds (ref 24–36)

## 2022-07-26 MED ORDER — IOHEXOL 350 MG/ML SOLN
100.0000 mL | Freq: Once | INTRAVENOUS | Status: AC | PRN
  Administered 2022-07-26: 100 mL via INTRAVENOUS

## 2022-07-26 MED ORDER — KCL IN DEXTROSE-NACL 20-5-0.9 MEQ/L-%-% IV SOLN
INTRAVENOUS | Status: DC
  Filled 2022-07-26 (×2): qty 1000

## 2022-07-26 MED ORDER — ACETAMINOPHEN 325 MG PO TABS: 650.0000 mg | ORAL_TABLET | ORAL | Status: DC | PRN

## 2022-07-26 MED ORDER — HYDRALAZINE HCL 20 MG/ML IJ SOLN
5.0000 mg | INTRAMUSCULAR | Status: DC | PRN
  Administered 2022-07-26 – 2022-07-27 (×2): 5 mg via INTRAVENOUS
  Filled 2022-07-26 (×2): qty 1

## 2022-07-26 MED ORDER — NALOXONE HCL 0.4 MG/ML IJ SOLN: 1.0000 mg | Freq: Once | INTRAMUSCULAR | Status: DC

## 2022-07-26 MED ORDER — SODIUM CHLORIDE 0.9 % IV BOLUS
1000.0000 mL | Freq: Once | INTRAVENOUS | Status: AC
  Administered 2022-07-26: 1000 mL via INTRAVENOUS

## 2022-07-26 MED ORDER — ACETAMINOPHEN 160 MG/5ML PO SOLN: 650.0000 mg | ORAL | Status: DC | PRN

## 2022-07-26 MED ORDER — ACETAMINOPHEN 650 MG RE SUPP: 650.0000 mg | RECTAL | Status: DC | PRN

## 2022-07-26 MED ORDER — STROKE: EARLY STAGES OF RECOVERY BOOK
Freq: Once | Status: DC
  Filled 2022-07-26: qty 1

## 2022-07-26 MED ORDER — NALOXONE HCL 2 MG/2ML IJ SOSY
1.0000 mg | PREFILLED_SYRINGE | Freq: Once | INTRAMUSCULAR | Status: AC
  Administered 2022-07-26: 1 mg via INTRAVENOUS
  Filled 2022-07-26: qty 2

## 2022-07-26 MED ORDER — ENOXAPARIN SODIUM 40 MG/0.4ML IJ SOSY
40.0000 mg | PREFILLED_SYRINGE | INTRAMUSCULAR | Status: DC
  Administered 2022-07-26 – 2022-07-27 (×2): 40 mg via SUBCUTANEOUS
  Filled 2022-07-26 (×2): qty 0.4

## 2022-07-26 NOTE — Assessment & Plan Note (Signed)
Parkinson's with baseline dementia.  Discharged to nursing home 2/9 after recent hospitalization.  At baseline can answer simple questions, recognizes family.  Uses wheelchair.

## 2022-07-26 NOTE — ED Notes (Signed)
Incontinent cares provided and malewick placed.  Noted that pt has skin tear to right elbow and large bruising present to posterior upper left arm.  BP cuff now placed around left leg as pt appears to have pain with left arm movement.

## 2022-07-26 NOTE — ED Notes (Signed)
Pt. Now getting head CT

## 2022-07-26 NOTE — H&P (Signed)
History and Physical    Joshua Everett [id number] DOB: [birth year] DOA: 07/26/2022  PCP: Ria Bush, MD   [facility] coming from: [facility]  I have personally briefly reviewed patient's old medical records in [place]  Chief Complaint: AMS  HPI: Joshua Everett is a 88 y.o. male with medical history significant for  Parkinsons, Dementia, HTN, PE. Patient was brought to the ED from nursing home with reports of altered mental status. At the time of my evaluation, patient is almost obtunded, barely responding to painful stimuli.  Patient's wife is at bedside and provides the history.  Patient has been at the nursing home since 07/14/22  when he was discharged from the hospital.  Patients wife checks on patient every day.  Today she came to visit patient, was sitting in his wheelchair with his head hanging forward as he normally does.  She told that she was here, and he responded with "I do not love my wife".  She went outside to get a soft neck collar she had bought for him, and was back in about 6 to 7 minutes.  She noticed that he had drool over the front of his shirt, when she raised his head up, she noticed he was not responsive.  EMS was called.   Overnight, per staff patient had had a rough night, he did not sleep, he was agitated. She last saw him yesterday.  She has not had any cough no difficulty breathing no fevers no chills no vomiting no loose stools.  At baseline, patient has Parkinson's, dementia, he recognizes her, he is able to answer simple questions but not hold a conversation, she talks to him and she believes he understands.  Patient has had 3 prior episodes of unresponsiveness, this is the fourth.  With prior episodes, he will make a loud guttural sound, and become unresponsive, this will last about 10 minutes, and he would come to slowly, and subsequently fall into a deep sleep.  No jerking of extremities.  He was on lamotrigine, but this was discontinued  by his neurologist due to intolerance.  He was placed on gabapentin.  Recent hospitalization 2/1 to 2/9 for recurrent falls, small subsegmental nonobstructive PE seen on CTA, initially placed on anticoagulation with heparin, later discontinued due to recurrent falls.  ED Course: Temperature 96.9.  Heart rate 67-71.  Respiratory 12-15.  Blood pressure systolic [id number]. Code stroke was called, evaluated by teleneurologist Dr. Rory Percy.  Head CT unremarkable, CTA head and neck negative perfusion.  No core infarct or penumbra. MRI Brain-moderate to severely motion limited study. somewhat linear appearing DWI hyperintensity and possible FLAIR hyperintensity in the globus palladi bilaterally. While this could be artifactual, acute hypoxic/ischemic or toxic/metabolic (including carbon monoxide) insult is not excluded. If the patient does not improve clinically, a follow-up MRI could assess for change. 2. Punctate acute infarct in the right cerebellum. No significant MR mass effect. Linear appearing. 1 mg Narcan given without response. 1 L bolus given Teleneurology recommended admission to [facility], for continuous EEG monitoring.   Review of Systems: Unable to assess due to altered mental status  Past Medical History:  Diagnosis Date   Actinic keratosis    Basal cell carcinoma 09/03/2014   right of midline forehead   Basal cell carcinoma 03/25/2019   right lower nasal dorsum, [facility]   Basal cell carcinoma 03/30/2020   Mid nasal tip, [facility]   Basal cell carcinoma (BCC) 01/28/2015   right proximal nasal alar rim  BPH without obstruction/lower urinary tract symptoms 09/08/2014   Chronic constipation 09/08/2014   GERD (gastroesophageal reflux disease)    Glaucoma    History of chicken pox    Hyperlipidemia    Obesity, Class I, BMI 30-34.9 09/08/2014   Prediabetes 07/31/2015   Rosacea 07/06/2015   Squamous cell carcinoma of skin 01/30/2007   mid back, in situ   Squamous cell carcinoma of skin 03/12/2018    right forearm, in situ   Squamous cell carcinoma of skin 04/22/2018   left pretibial   Squamous cell carcinoma of skin 09/24/2018   right hand dorsum, in situ   Urine incontinence     Past Surgical History:  Procedure Laterality Date   COLONOSCOPY  03/2009   1 TA, diverticulosis, rpt 5 yrs (Dr Acquanetta [facility] in [facility])   ESOPHAGOGASTRODUODENOSCOPY  03/2009   small HH, gastric polyps biopsied, dilation for dysphagia ([facility])   [place]     reports that he quit smoking about 54 years ago. His smoking use included cigarettes. He has never used smokeless tobacco. He reports that he does not currently use alcohol. He reports that he does not use drugs.  Allergies  Allergen Reactions   Solifenacin Other (See Comments)    Hallucinations (per uro)    Family History  Problem Relation Age of Onset   Diabetes Mother    Alzheimer's disease Mother 44   Kidney disease Father    Cancer Father        brain tumor   Cancer Sister        breast   Prior to Admission medications   Medication Sig Start Date End Date Taking? Authorizing Provider  aspirin EC 81 MG tablet Take 81 mg by mouth daily. Swallow whole.    [provider]  bimatoprost (LUMIGAN) 0.01 % SOLN Place 1 drop into both eyes at bedtime.     [provider]  carbidopa-levodopa (SINEMET IR) 25-100 MG tablet Take 2 tablets by mouth 4 (four) times daily. 04/07/22   Ria Bush, MD  cholecalciferol (VITAMIN D) 1000 units tablet Take 1,000 Units by mouth daily.     [provider]  Cyanocobalamin (VITAMIN B 12 PO) Take by mouth daily.    [provider]  gabapentin (NEURONTIN) 100 MG capsule Take 100 mg by mouth at bedtime.    [provider]  hydrochlorothiazide (HYDRODIURIL) 12.5 MG tablet Take 0.5 tablets (6.25 mg total) by mouth daily. 07/08/22 08/07/22  Hollice Gong, Mir M, MD  lisinopril (ZESTRIL) 10 MG tablet Take 1 tablet (10 mg total) by mouth 2  (two) times daily. 07/09/22 08/08/22  Hollice Gong, Mir M, MD  Multiple Vitamins-Minerals (MULTIVITAMIN ADULT PO) Take 1 tablet by mouth daily. Patient not taking: Reported on 07/06/2022    [provider]  polyethylene glycol powder (GLYCOLAX/MIRALAX) 17 GM/SCOOP powder Take 17 g by mouth daily as needed for moderate constipation. 01/27/22   Ria Bush, MD  QUEtiapine (SEROQUEL) 50 MG tablet Take 1 tablet (50 mg total) by mouth at bedtime. 07/14/22   Mercy Riding, MD  sennosides-docusate sodium (SENOKOT-S) 8.6-50 MG tablet Take 1 tablet by mouth daily. 04/07/22   Ria Bush, MD  timolol (TIMOPTIC) 0.5 % ophthalmic solution Place 1 drop into both eyes daily.    [provider]    Physical Exam: Limited exam due to altered mental status Vitals:   07/26/22 1311 07/26/22 1315 07/26/22 1331 07/26/22 1500  BP:  124/82  134/61  Pulse:  71  68  Resp:  13  15  Temp:   (!) 96.9 F (36.1 C)   TempSrc:   Rectal   SpO2:  98%  96%  Weight: 95 kg     Height: 5' 10"$  (1.778 m)       Constitutional:  near obtundation, Barely any response to sternal rub Vitals:   07/26/22 1311 07/26/22 1315 07/26/22 1331 07/26/22 1500  BP:  124/82  134/61  Pulse:  71  68  Resp:  13  15  Temp:   (!) 96.9 F (36.1 C)   TempSrc:   Rectal   SpO2:  98%  96%  Weight: 95 kg     Height: 5' 10"$  (1.778 m)      Eyes: I am able to lift lids without resistance, pupils pinpoint, bilaterally, equal, lids and conjunctivae normal ENMT: Unable to examine Respiratory: clear to auscultation bilaterally, no wheezing, no crackles. Normal respiratory effort. No accessory muscle use.  Cardiovascular: Regular rate and rhythm, no murmurs / rubs / gallops. No extremity edema.  Extremities warm Abdomen: no tenderness, no masses palpated. No hepatosplenomegaly.   Musculoskeletal: no clubbing / cyanosis. No joint deformity upper and lower extremities.  Skin: Multiple senile purpuric lesions to the upper extremities,  no rashes, lesions, ulcers. No induration Neurologic: Limited due to altered mental status.  No facial asymmetry.  Barely any responses to sternal rub. Psychiatric: Normal judgment and insight. Alert and oriented x 3. Normal mood.   Labs on Admission: I have personally reviewed following labs and imaging studies  CBC: Recent Labs  Lab 07/26/22 1351  WBC 6.1  NEUTROABS 4.1  HGB 12.0*  HCT 37.4*  MCV 94.2  PLT [id number]   Basic Metabolic Panel: Recent Labs  Lab 07/26/22 1351  NA 137  K 3.8  CL 104  CO2 27  GLUCOSE 109*  BUN 34*  CREATININE 1.20  CALCIUM 8.7*   GFR: Estimated Creatinine Clearance: 52.1 mL/min (by C-G formula based on SCr of 1.2 mg/dL). Liver Function Tests: Recent Labs  Lab 07/26/22 1351  AST 58*  ALT 7  ALKPHOS 43  BILITOT 0.7  PROT 5.2*  ALBUMIN 2.9*   Coagulation Profile: Recent Labs  Lab 07/26/22 1507  INR 1.1   CBG: Recent Labs  Lab 07/26/22 1240  GLUCAP 124*   Thyroid Function Tests: Recent Labs    07/26/22 1351  TSH 2.911   Urine analysis:    Component Value Date/Time   COLORURINE YELLOW 07/26/2022 1400   APPEARANCEUR CLEAR 07/26/2022 1400   LABSPEC 1.019 07/26/2022 1400   PHURINE 5.0 07/26/2022 1400   GLUCOSEU NEGATIVE 07/26/2022 1400   HGBUR NEGATIVE 07/26/2022 1400   BILIRUBINUR NEGATIVE 07/26/2022 1400   BILIRUBINUR negative 01/06/2020 1000   KETONESUR 5 (A) 07/26/2022 1400   PROTEINUR NEGATIVE 07/26/2022 1400   UROBILINOGEN 1.0 01/06/2020 1000   NITRITE NEGATIVE 07/26/2022 1400   LEUKOCYTESUR NEGATIVE 07/26/2022 1400    Radiological Exams on Admission: MR BRAIN WO CONTRAST  Result Date: 07/26/2022 CLINICAL DATA:  [place]irium EXAM: MRI HEAD WITHOUT CONTRAST TECHNIQUE: Multiplanar, multiecho pulse sequences of the brain and surrounding structures were obtained without intravenous contrast. COMPARISON:  MRI head February 2024. FINDINGS: Moderate to severely motion limited study.  Within this limitation: Brain:  Significantly motion limited study with somewhat linear appearing DWI hyperintensity and FLAIR hyperintensity in the globus palladi bilaterally. Additional more convincing acute infarct in the right cerebellum with punctate focus of restricted diffusion on series 9, image 10.  No evidence of acute hemorrhage, mass lesion, midline shift or hydrocephalus. Cerebral atrophy. Remote right cerebellar infarct. Vascular: Major arterial flow voids are maintained at the skull base. Skull and upper cervical spine: Normal marrow signal. Motion limited. Sinuses/Orbits: Negative. Other: No mastoid effusions. IMPRESSION: 1. Moderate to severely motion limited study with somewhat linear appearing DWI hyperintensity and possible FLAIR hyperintensity in the globus palladi bilaterally. While this could be artifactual, acute hypoxic/ischemic or toxic/metabolic (including carbon monoxide) insult is not excluded. If the patient does not improve clinically, a follow-up MRI could assess for change. 2. Punctate acute infarct in the right cerebellum. No significant MR mass effect. Electronically Signed   By: Margaretha Sheffield M.D.   On: 07/26/2022 15:03   CT ANGIO HEAD NECK W WO CM W PERF (CODE STROKE)  Result Date: 07/26/2022 CLINICAL DATA:  Neuro deficit, acute, stroke suspected EXAM: CT ANGIOGRAPHY HEAD AND NECK CT PERFUSION BRAIN TECHNIQUE: Multidetector CT imaging of the head and neck was performed using the standard protocol during bolus administration of intravenous contrast. Multiplanar CT image reconstructions and MIPs were obtained to evaluate the vascular anatomy. Carotid stenosis measurements (when applicable) are obtained utilizing NASCET criteria, using the distal internal carotid diameter as the denominator. Multiphase CT imaging of the brain was performed following IV bolus contrast injection. Subsequent parametric perfusion maps were calculated using RAPID software. RADIATION DOSE REDUCTION: This exam was performed  according to the departmental dose-optimization program which includes automated exposure control, adjustment of the mA and/or kV according to patient size and/or use of iterative reconstruction technique. CONTRAST:  166m OMNIPAQUE IOHEXOL 350 MG/ML SOLN COMPARISON:  Head CT immediately prior.  MRI 07/06/2022 FINDINGS: CTA NECK FINDINGS Aortic arch: Mild aortic atherosclerotic calcification. Branching pattern is normal without origin stenosis. Right carotid system: Common carotid artery widely patent to the bifurcation. Minimal plaque at the bifurcation but no stenosis. Cervical ICA widely patent. Left carotid system: Common carotid artery widely patent to the bifurcation. Bifurcation is normal. Cervical ICA is normal. Vertebral arteries: No proximal subclavian stenosis. Both vertebral artery origins are widely patent. Both vertebral arteries are normal through the cervical region to the foramen magnum. Skeleton: Ordinary chronic cervical spondylosis. Other neck: No mass or lymphadenopathy. Upper chest: Lung apices are clear. Review of the MIP images confirms the above findings CTA HEAD FINDINGS Anterior circulation: Both internal carotid arteries are patent through the skull base and siphon regions. There is ordinary siphon atherosclerotic calcification but no stenosis. The anterior and middle cerebral arteries are patent. No large vessel occlusion or proximal stenosis. No aneurysm or vascular malformation. Distal branch vessels do show some atherosclerotic irregularity. Posterior circulation: Both vertebral arteries are patent through the foramen magnum to the basilar artery. There is calcified plaque in both vertebral artery V4 segments. No stenosis on the right. 30-50% stenosis on the left. No basilar artery stenosis. Superior cerebellar and posterior cerebral arteries are patent. Venous sinuses: Patent and normal. Anatomic variants: Azygous anterior cerebral artery. Review of the MIP images confirms the above  findings CT Brain Perfusion Findings: ASPECTS: 10 allowing for old strokes. CBF (<30%) Volume: 073mPerfusion (Tmax>6.0s) volume: 85m66mismatch Volume: 85mL35mfarction Location:None IMPRESSION: 1. Negative perfusion study. No evidence of core infarction or penumbra. 2. No intracranial large vessel occlusion or flow limiting proximal stenosis. Distal vessel atherosclerotic irregularity. 3. Mild/minimal atherosclerotic change of the aorta and carotid bifurcations but without stenosis. 4. 30-50% stenosis of the left vertebral artery V4 segment. Aortic Atherosclerosis (ICD10-I70.0). Electronically Signed   By: MarkElta Guadeloupe  Shogry M.D.   On: 07/26/2022 13:21   CT HEAD CODE STROKE WO CONTRAST  Result Date: 07/26/2022 CLINICAL DATA:  Code stroke. Neuro deficit, acute, stroke suspected. Altered mental status. EXAM: CT HEAD WITHOUT CONTRAST TECHNIQUE: Contiguous axial images were obtained from the base of the skull through the vertex without intravenous contrast. RADIATION DOSE REDUCTION: This exam was performed according to the departmental dose-optimization program which includes automated exposure control, adjustment of the mA and/or kV according to patient size and/or use of iterative reconstruction technique. COMPARISON:  Head CT and MRI 07/06/2022 FINDINGS: Brain: There is no evidence of an acute infarct, intracranial hemorrhage, mass, midline shift, or extra-axial fluid collection. Chronic right frontal and cerebellar infarcts are again noted. Patchy hypodensities in the cerebral white matter bilaterally are unchanged and nonspecific but compatible with moderate chronic small vessel ischemic disease. There is moderate cerebral atrophy. Vascular: Calcified atherosclerosis at the skull base. No hyperdense vessel. Skull: No fracture or suspicious osseous lesion. Sinuses/Orbits: Paranasal sinuses and mastoid air cells are clear. Unremarkable orbits. Other: None. These results were called by telephone at the time of  interpretation on 07/26/2022 at 12:59 p.m. to Dr. Milton Ferguson, who verbally acknowledged these results. IMPRESSION: 1. No evidence of acute intracranial abnormality. 2. Moderate chronic small vessel ischemic disease and cerebral atrophy. 3. Chronic right frontal and cerebellar infarcts. Electronically Signed   By: Logan Bores M.D.   On: 07/26/2022 13:00    EKG: Independently reviewed.  Sinus rhythm, rate 76, QTc 468.  Assessment/Plan Principal Problem:   Acute encephalopathy Active Problems:   BPH without obstruction/lower urinary tract symptoms   Essential hypertension   Parkinson disease   Pulmonary embolism (HCC)  Assessment and Plan: * Acute encephalopathy Encephalopathy type as yet unspecified.  Currently nearly obtunded barely responsive to sternal rub.  Likely sudden intracranial event as etiology.  History of seizures, has had 3 prior episodes of unresponsiveness.  Was on lamotrigine for a time, this was discontinued by his neurologist due to intolerance.  Was placed on gabapentin 100 daily.  MRI brain motion degraded, hypodensity in globus pallidus bilaterally- artifactual, acute hypoxic/ischemic or toxic metabolic insult not excluded, punctate acute infarct in right cerebellum. -Neurologist consulted,Admit to [facility], needs continuous EEG -NPO - 1 L bolus given, continue D5+ N/s + 20 kcl 75cc/hr x 1 day -CBG twice daily -Hold gabapentin, Sinemet, Seroquel while n.p.o. -Care Order instruction to page neurology on arrival to [facility] -Recent echo 07/08/2022, EF of 50 to 55%, G1DD, will defer need for full stroke workup to neurology  Pulmonary embolism ([place]) Diagnosed with PE 07/07/22- CTA- isolated segmental PE in right lower lobe, small overall clot burden PE 07/07/2022, initially placed on heparin drip, this was discontinued due 2 history of frequent falls.  He was discharged on aspirin.  Parkinson disease Parkinson's with baseline dementia.  Discharged to nursing home 2/9  after recent hospitalization.  At baseline can answer simple questions, recognizes family.  Uses wheelchair.  Essential hypertension Stable. -Hold lisinopril, HCTZ -As needed hydralazine for systolic greater than [id number]  BPH without obstruction/lower urinary tract symptoms Foley placed in ED.   DVT prophylaxis: Lovenox Code Status: DNR.  Confirmed with wife at bedside-she states patient has a signed DNR form.   Family Communication: Spouse at bedside is HCPOA. Disposition Plan: ~ 2 days Consults called:Neurology Admission status:  Obs stepdown  Author: Bethena Roys, MD 07/26/2022 5:04 PM  For on call review [email or web address].

## 2022-07-26 NOTE — ED Notes (Signed)
Beeped out CODE STROKE to CT and Lab @ 1240

## 2022-07-26 NOTE — ED Notes (Signed)
Per teleneuro doc , x2neuro checks for patient at this time

## 2022-07-26 NOTE — Assessment & Plan Note (Signed)
Stable. -Hold lisinopril, HCTZ -As needed hydralazine for systolic greater than [id number]

## 2022-07-26 NOTE — ED Notes (Signed)
[facility] present to transport pt

## 2022-07-26 NOTE — Plan of Care (Signed)
MRI of the brain has been completed.  I have reviewed it personally.  Radiologist concern for DWI and possible FLAIR hyperdensity in globus pallidus bilaterally-which I think is totally artifactual as that area is prone to artifacts.  There was no carbon monoxide exposure so I do not think this is carbon monoxide poisoning. He does have a punctate acute infarct in the right cerebellum, which again does not explain his amount of obtundation. I would recommend that he be admitted to [facility] for continuous EEG and continuing neurological follow-up. If he can be transferred in a reasonable time, he should be admitted to the hospitalist directly to a floor bed but if the wait for beds is too long, then an ED to ED transfer should be pursued. This was discussed in detail with Dr. Fuller Song. I will make my team at [facility] aware of his arrival.  He will be seen in follow-up tomorrow.  EEG will be hooked up once he arrives  -- Amie Portland, MD Neurologist [facility] Pager: [phone]

## 2022-07-26 NOTE — Progress Notes (Signed)
Code Stroke activated @ [id number].  Pt to CT @ 1240 with return @ 1301.  Dr. Rory Percy on camera @ 1241.    Jarold Song BSN, Horticulturist, commercial

## 2022-07-26 NOTE — ED Provider Notes (Signed)
[facility] Provider Note   CSN: [id number] Arrival date & time: 07/26/22  1218  An emergency department physician performed an initial assessment on this suspected stroke patient at 1237.  History  Chief Complaint  Patient presents with   Code Stroke    Joshua Everett is a 88 y.o. male.  Patient with history of Parkinson disease.  Patient became more obtunded today.  The history is provided by a relative. No language interpreter was used.  Altered Mental Status Presenting symptoms: behavior changes   Severity:  Severe Most recent episode:  Today Episode history:  Continuous Timing:  Constant Progression:  Waxing and waning Chronicity:  New Context: dementia        Home Medications Prior to Admission medications   Medication Sig Start Date End Date Taking? Authorizing Provider  aspirin EC 81 MG tablet Take 81 mg by mouth daily. Swallow whole.    [provider]  bimatoprost (LUMIGAN) 0.01 % SOLN Place 1 drop into both eyes at bedtime.     [provider]  carbidopa-levodopa (SINEMET IR) 25-100 MG tablet Take 2 tablets by mouth 4 (four) times daily. 04/07/22   Ria Bush, MD  cholecalciferol (VITAMIN D) 1000 units tablet Take 1,000 Units by mouth daily.     [provider]  Cyanocobalamin (VITAMIN B 12 PO) Take by mouth daily.    [provider]  gabapentin (NEURONTIN) 100 MG capsule Take 100 mg by mouth at bedtime.    [provider]  hydrochlorothiazide (HYDRODIURIL) 12.5 MG tablet Take 0.5 tablets (6.25 mg total) by mouth daily. 07/08/22 08/07/22  Hollice Gong, Mir M, MD  lisinopril (ZESTRIL) 10 MG tablet Take 1 tablet (10 mg total) by mouth 2 (two) times daily. 07/09/22 08/08/22  Hollice Gong, Mir M, MD  Multiple Vitamins-Minerals (MULTIVITAMIN ADULT PO) Take 1 tablet by mouth daily. Patient not taking: Reported on 07/06/2022    [provider]  polyethylene glycol powder  (GLYCOLAX/MIRALAX) 17 GM/SCOOP powder Take 17 g by mouth daily as needed for moderate constipation. 01/27/22   Ria Bush, MD  QUEtiapine (SEROQUEL) 50 MG tablet Take 1 tablet (50 mg total) by mouth at bedtime. 07/14/22   Mercy Riding, MD  sennosides-docusate sodium (SENOKOT-S) 8.6-50 MG tablet Take 1 tablet by mouth daily. 04/07/22   Ria Bush, MD  timolol (TIMOPTIC) 0.5 % ophthalmic solution Place 1 drop into both eyes daily.    [provider]      Allergies    Solifenacin    Review of Systems   Review of Systems  Unable to perform ROS: Dementia    Physical Exam Updated Vital Signs BP 134/61   Pulse 68   Temp (!) 96.9 F (36.1 C) (Rectal)   Resp 15   Ht '5\' 10"'$  (1.778 m)   Wt 95 kg   SpO2 96%   BMI 30.05 kg/m  Physical Exam Vitals and nursing note reviewed.  Constitutional:      Appearance: He is well-developed.     Comments: Lethargic  HENT:     Head: Normocephalic.     Nose: Nose normal.  Eyes:     General: No scleral icterus.    Conjunctiva/sclera: Conjunctivae normal.  Neck:     Thyroid: No thyromegaly.  Cardiovascular:     Rate and Rhythm: Normal rate and regular rhythm.     Heart sounds: No murmur heard.    No friction rub. No gallop.  Pulmonary:  Breath sounds: No stridor. No wheezing or rales.  Chest:     Chest wall: No tenderness.  Abdominal:     General: There is no distension.     Tenderness: There is no abdominal tenderness. There is no rebound.  Musculoskeletal:        General: Normal range of motion.     Cervical back: Neck supple.  Lymphadenopathy:     Cervical: No cervical adenopathy.  Skin:    Findings: No erythema or rash.  Neurological:     Motor: No abnormal muscle tone.     Coordination: Coordination normal.     Comments: Patient will open his eyes to verbal and painful stimuli.  He will not following commands     ED Results / Procedures / Treatments   Labs (all labs ordered are listed, but only abnormal  results are displayed) Labs Reviewed  CBC - Abnormal; Notable for the following components:      Result Value   RBC 3.97 (*)    Hemoglobin 12.0 (*)    HCT 37.4 (*)    All other components within normal limits  COMPREHENSIVE METABOLIC PANEL - Abnormal; Notable for the following components:   Glucose, Bld 109 (*)    BUN 34 (*)    Calcium 8.7 (*)    Total Protein 5.2 (*)    Albumin 2.9 (*)    AST 58 (*)    GFR, Estimated 59 (*)    All other components within normal limits  URINALYSIS, ROUTINE W REFLEX MICROSCOPIC - Abnormal; Notable for the following components:   Ketones, ur 5 (*)    All other components within normal limits  CBG MONITORING, ED - Abnormal; Notable for the following components:   Glucose-Capillary 124 (*)    All other components within normal limits  CULTURE, BLOOD (ROUTINE X 2)  CULTURE, BLOOD (ROUTINE X 2)  RESP PANEL BY RT-PCR (RSV, FLU A&B, COVID)  RVPGX2  ETHANOL  DIFFERENTIAL  RAPID URINE DRUG SCREEN, HOSP PERFORMED  TSH  PROTIME-INR  APTT  I-STAT CHEM 8, ED    EKG None  Radiology DG Chest Port 1 View  Result Date: 07/26/2022 CLINICAL DATA:  Mental status change. EXAM: PORTABLE CHEST 1 VIEW COMPARISON:  05/04/2022 FINDINGS: Poor inspiration. Heart size upper limits of normal. Chronic aortic atherosclerosis. Poor aeration of the lung bases, presumably subsequent to the poor inspiration. Cannot rule out mild basilar infiltrate/atelectasis, but the findings may just be subsequent to the poor inspiration. IMPRESSION: Poor aeration of the lung bases, presumably subsequent to the poor inspiration. Cannot rule out mild basilar infiltrate/atelectasis. Electronically Signed   By: Nelson Chimes M.D.   On: 07/26/2022 15:40   MR BRAIN WO CONTRAST  Result Date: 07/26/2022 CLINICAL DATA:  Delirium EXAM: MRI HEAD WITHOUT CONTRAST TECHNIQUE: Multiplanar, multiecho pulse sequences of the brain and surrounding structures were obtained without intravenous contrast.  COMPARISON:  MRI head February 2024. FINDINGS: Moderate to severely motion limited study.  Within this limitation: Brain: Significantly motion limited study with somewhat linear appearing DWI hyperintensity and FLAIR hyperintensity in the globus palladi bilaterally. Additional more convincing acute infarct in the right cerebellum with punctate focus of restricted diffusion on series 9, image 10. No evidence of acute hemorrhage, mass lesion, midline shift or hydrocephalus. Cerebral atrophy. Remote right cerebellar infarct. Vascular: Major arterial flow voids are maintained at the skull base. Skull and upper cervical spine: Normal marrow signal. Motion limited. Sinuses/Orbits: Negative. Other: No mastoid effusions. IMPRESSION: 1. Moderate to severely  motion limited study with somewhat linear appearing DWI hyperintensity and possible FLAIR hyperintensity in the globus palladi bilaterally. While this could be artifactual, acute hypoxic/ischemic or toxic/metabolic (including carbon monoxide) insult is not excluded. If the patient does not improve clinically, a follow-up MRI could assess for change. 2. Punctate acute infarct in the right cerebellum. No significant MR mass effect. Electronically Signed   By: Margaretha Sheffield M.D.   On: 07/26/2022 15:03   CT ANGIO HEAD NECK W WO CM W PERF (CODE STROKE)  Result Date: 07/26/2022 CLINICAL DATA:  Neuro deficit, acute, stroke suspected EXAM: CT ANGIOGRAPHY HEAD AND NECK CT PERFUSION BRAIN TECHNIQUE: Multidetector CT imaging of the head and neck was performed using the standard protocol during bolus administration of intravenous contrast. Multiplanar CT image reconstructions and MIPs were obtained to evaluate the vascular anatomy. Carotid stenosis measurements (when applicable) are obtained utilizing NASCET criteria, using the distal internal carotid diameter as the denominator. Multiphase CT imaging of the brain was performed following IV bolus contrast injection.  Subsequent parametric perfusion maps were calculated using RAPID software. RADIATION DOSE REDUCTION: This exam was performed according to the departmental dose-optimization program which includes automated exposure control, adjustment of the mA and/or kV according to patient size and/or use of iterative reconstruction technique. CONTRAST:  123m OMNIPAQUE IOHEXOL 350 MG/ML SOLN COMPARISON:  Head CT immediately prior.  MRI 07/06/2022 FINDINGS: CTA NECK FINDINGS Aortic arch: Mild aortic atherosclerotic calcification. Branching pattern is normal without origin stenosis. Right carotid system: Common carotid artery widely patent to the bifurcation. Minimal plaque at the bifurcation but no stenosis. Cervical ICA widely patent. Left carotid system: Common carotid artery widely patent to the bifurcation. Bifurcation is normal. Cervical ICA is normal. Vertebral arteries: No proximal subclavian stenosis. Both vertebral artery origins are widely patent. Both vertebral arteries are normal through the cervical region to the foramen magnum. Skeleton: Ordinary chronic cervical spondylosis. Other neck: No mass or lymphadenopathy. Upper chest: Lung apices are clear. Review of the MIP images confirms the above findings CTA HEAD FINDINGS Anterior circulation: Both internal carotid arteries are patent through the skull base and siphon regions. There is ordinary siphon atherosclerotic calcification but no stenosis. The anterior and middle cerebral arteries are patent. No large vessel occlusion or proximal stenosis. No aneurysm or vascular malformation. Distal branch vessels do show some atherosclerotic irregularity. Posterior circulation: Both vertebral arteries are patent through the foramen magnum to the basilar artery. There is calcified plaque in both vertebral artery V4 segments. No stenosis on the right. 30-50% stenosis on the left. No basilar artery stenosis. Superior cerebellar and posterior cerebral arteries are patent. Venous  sinuses: Patent and normal. Anatomic variants: Azygous anterior cerebral artery. Review of the MIP images confirms the above findings CT Brain Perfusion Findings: ASPECTS: 10 allowing for old strokes. CBF (<30%) Volume: 056mPerfusion (Tmax>6.0s) volume: 76m65mismatch Volume: 76mL6mfarction Location:None IMPRESSION: 1. Negative perfusion study. No evidence of core infarction or penumbra. 2. No intracranial large vessel occlusion or flow limiting proximal stenosis. Distal vessel atherosclerotic irregularity. 3. Mild/minimal atherosclerotic change of the aorta and carotid bifurcations but without stenosis. 4. 30-50% stenosis of the left vertebral artery V4 segment. Aortic Atherosclerosis (ICD10-I70.0). Electronically Signed   By: MarkNelson Chimes.   On: 07/26/2022 13:21   CT HEAD CODE STROKE WO CONTRAST  Result Date: 07/26/2022 CLINICAL DATA:  Code stroke. Neuro deficit, acute, stroke suspected. Altered mental status. EXAM: CT HEAD WITHOUT CONTRAST TECHNIQUE: Contiguous axial images were obtained from the base of the  skull through the vertex without intravenous contrast. RADIATION DOSE REDUCTION: This exam was performed according to the departmental dose-optimization program which includes automated exposure control, adjustment of the mA and/or kV according to patient size and/or use of iterative reconstruction technique. COMPARISON:  Head CT and MRI 07/06/2022 FINDINGS: Brain: There is no evidence of an acute infarct, intracranial hemorrhage, mass, midline shift, or extra-axial fluid collection. Chronic right frontal and cerebellar infarcts are again noted. Patchy hypodensities in the cerebral white matter bilaterally are unchanged and nonspecific but compatible with moderate chronic small vessel ischemic disease. There is moderate cerebral atrophy. Vascular: Calcified atherosclerosis at the skull base. No hyperdense vessel. Skull: No fracture or suspicious osseous lesion. Sinuses/Orbits: Paranasal sinuses and  mastoid air cells are clear. Unremarkable orbits. Other: None. These results were called by telephone at the time of interpretation on 07/26/2022 at 12:59 p.m. to Dr. Milton Ferguson, who verbally acknowledged these results. IMPRESSION: 1. No evidence of acute intracranial abnormality. 2. Moderate chronic small vessel ischemic disease and cerebral atrophy. 3. Chronic right frontal and cerebellar infarcts. Electronically Signed   By: Logan Bores M.D.   On: 07/26/2022 13:00    Procedures Procedures   Medications Ordered in ED Medications  sodium chloride 0.9 % bolus 1,000 mL (1,000 mLs Intravenous Bolus 07/26/22 1322)  iohexol (OMNIPAQUE) 350 MG/ML injection 100 mL (100 mLs Intravenous Contrast Given 07/26/22 1309)  naloxone (NARCAN) injection 1 mg (1 mg Intravenous Given 07/26/22 1510)    ED Course/ Medical Decision Making/ A&P   {  CRITICAL CARE Performed by: Milton Ferguson Total critical care time: 50 minutes Critical care time was exclusive of separately billable procedures and treating other patients. Critical care was necessary to treat or prevent imminent or life-threatening deterioration. Critical care was time spent personally by me on the following activities: development of treatment plan with patient and/or surrogate as well as nursing, discussions with consultants, evaluation of patient's response to treatment, examination of patient, obtaining history from patient or surrogate, ordering and performing treatments and interventions, ordering and review of laboratory studies, ordering and review of radiographic studies, pulse oximetry and re-evaluation of patient's condition.                            Medical Decision Making Amount and/or Complexity of Data Reviewed Labs: ordered. Radiology: ordered.  Risk Prescription drug management. Decision regarding hospitalization.  This patient presents to the ED for concern of altered mental status, this involves an extensive number of  treatment options, and is a complaint that carries with it a high risk of complications and morbidity.  The differential diagnosis includes stroke, worsening Parkinson's disease,   Co morbidities that complicate the patient evaluation  Parkinson's disease   Additional history obtained:  Additional history obtained from wife External records from outside source obtained and reviewed including hospital records   Lab Tests:  I Ordered, and personally interpreted labs.  The pertinent results include: Hemoglobin 12 BUN 34   Imaging Studies ordered:  I ordered imaging studies including MRI I independently visualized and interpreted imaging which showed punctate acute infarct in right cerebellum I agree with the radiologist interpretation   Cardiac Monitoring: / EKG:  The patient was maintained on a cardiac monitor.  I personally viewed and interpreted the cardiac monitored which showed an underlying rhythm of: Normal sinus rhythm   Consultations Obtained:  I requested consultation with the neurology and hospitalist,  and discussed lab and imaging findings  as well as pertinent plan - they recommend: Admit to medicine at [facility] and neurology consult for continuous EEG   Problem List / ED Course / Critical interventions / Medication management  Parkinson's and altered mental status I ordered medication including normal saline Reevaluation of the patient after these medicines showed that the patient stayed the same I have reviewed the patients home medicines and have made adjustments as needed   Social Determinants of Health:  None   Test / Admission - Considered:  Continuous EEG  Mental status with Parkinson disease.  Patient will be admitted to medicine at [facility]   Final Clinical Impression(s) / ED Diagnoses Final diagnoses:  Parkinson's disease without dyskinesia, unspecified whether manifestations fluctuate    Rx / DC Orders ED Discharge Orders     None          Milton Ferguson, MD 07/28/22 1010

## 2022-07-26 NOTE — Progress Notes (Signed)
EEG complete - results pending 

## 2022-07-26 NOTE — Assessment & Plan Note (Signed)
Foley placed in ED.

## 2022-07-26 NOTE — Assessment & Plan Note (Signed)
Diagnosed with PE 07/07/22- CTA- isolated segmental PE in right lower lobe, small overall clot burden PE 07/07/2022, initially placed on heparin drip, this was discontinued due 2 history of frequent falls.  He was discharged on aspirin.

## 2022-07-26 NOTE — ED Triage Notes (Signed)
Patient arrived by [facility]. Pt is from the [facility]. Pts wife came to see him this morning and said he was normal at 1030am. Pt wife walked away from him for a few minutes and when  she came back pt was drooling and unresponsive. Ems states pt BP was [id number] systolic. 2 boluses normal saline given. Bp [id number] systolic in triage. Dr. Roderic Palau at bedside who called a code stroke . Pt is only responsive to pain . VSS

## 2022-07-26 NOTE — Consult Note (Signed)
[facility] Telemedicine Consult   Requesting Provider: Dr. Roderic Palau Consult Participants: Dr. Jerelyn Charles, Telespecialist RN Malachy Mood   bedside RN Raquel Sarna Location of the provider-[place] regional Location of the patient: [facility], emergency room 15  This consult was provided via telemedicine with 2-way video and audio communication. The patient/family was informed that care would be provided in this way and agreed to receive care in this manner.   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, hyperlipidemia, multiple locations of squamous cell carcinoma of the skin as documented below, basal cell carcinoma on multiple skin sites as below, history of urinary incontinence, resident of a nursing home after recent hospitalization at [facility] hospital where he was evaluated for recurrent falls, was found to have a pulmonary embolus, no evidence of DVT, started on heparin drip but due to frequent falls, anticoagulation was discontinued and he was discharged home on aspirin 81 only, presenting from the facility when he was noted by his wife to be drooling from his mouth and not being responsive this morning.  She saw him at 10:30 AM with the symptoms but the last known well is unclear.  She says that the patient's roommate reported that he had a rough night, he was very agitated and the staff was also pretty rough with him.  I was unable to obtain a time when he was last known well.  She says that he has times where he is pretty lucid but other times he hallucinates and sees his mother and sister quite a lot.  She sees him every day since he has been in rehab and last saw him yesterday at some  point.  He was brought in by EMS from [facility] will, with blood pressures in the 70s with 2 boluses given and blood pressure up to [id number] systolic.  Evaluated by the ED provider, responsive only to pain and code stroke was activated.   He follows with [facility]-Dr. Manuella Ghazi.  He is also being worked up for seizure-like activity where he has had a loss of consciousness, vocalization of loud groan, drooling and sweating and increasing confusion after the event in September 2022 and another episode in April 2023 and the wife says has happened 1 more time for a total of 3 events.  He was initially on lamotrigine which was discontinued and these episodes happened after that.  He has since been kept on gabapentin 100 mg every night because he is not tolerating any higher doses of that.  Today, there was no witnessed seizure activity by wife.  Past Medical History:  Diagnosis Date   Actinic keratosis    Basal cell carcinoma 09/03/2014   right of midline forehead   Basal cell carcinoma 03/25/2019   right lower nasal dorsum, [facility]   Basal cell carcinoma 03/30/2020   Mid nasal tip, [facility]   Basal cell carcinoma (BCC) 01/28/2015   right proximal nasal alar rim   BPH without obstruction/lower urinary tract symptoms 09/08/2014   Chronic constipation 09/08/2014   GERD (gastroesophageal reflux disease)    Glaucoma    History of chicken pox    Hyperlipidemia    Obesity, Class I, BMI 30-34.9 09/08/2014   Prediabetes 07/31/2015   Rosacea 07/06/2015  Squamous cell carcinoma of skin 01/30/2007   mid back, in situ   Squamous cell carcinoma of skin 03/12/2018   right forearm, in situ   Squamous cell carcinoma of skin 04/22/2018   left pretibial   Squamous cell carcinoma of skin 09/24/2018   right hand dorsum, in situ   Urine incontinence      Current Facility-Administered Medications:    iohexol (OMNIPAQUE) 350 MG/ML injection 100 mL, 100 mL, Intravenous, Once PRN, Amie Portland, MD   sodium chloride 0.9  % bolus 1,000 mL, 1,000 mL, Intravenous, Once, Milton Ferguson, MD  Current Outpatient Medications:    aspirin EC 81 MG tablet, Take 81 mg by mouth daily. Swallow whole., Disp: , Rfl:    bimatoprost (LUMIGAN) 0.01 % SOLN, Place 1 drop into both eyes at bedtime. , Disp: , Rfl:    carbidopa-levodopa (SINEMET IR) 25-100 MG tablet, Take 2 tablets by mouth 4 (four) times daily., Disp: , Rfl:    cholecalciferol (VITAMIN D) 1000 units tablet, Take 1,000 Units by mouth daily. , Disp: , Rfl:    Cyanocobalamin (VITAMIN B 12 PO), Take by mouth daily., Disp: , Rfl:    gabapentin (NEURONTIN) 100 MG capsule, Take 100 mg by mouth at bedtime., Disp: , Rfl:    hydrochlorothiazide (HYDRODIURIL) 12.5 MG tablet, Take 0.5 tablets (6.25 mg total) by mouth daily., Disp: 15 tablet, Rfl: 0   lisinopril (ZESTRIL) 10 MG tablet, Take 1 tablet (10 mg total) by mouth 2 (two) times daily., Disp: 60 tablet, Rfl: 0   Multiple Vitamins-Minerals (MULTIVITAMIN ADULT PO), Take 1 tablet by mouth daily. (Patient not taking: Reported on 07/06/2022), Disp: , Rfl:    polyethylene glycol powder (GLYCOLAX/MIRALAX) 17 GM/SCOOP powder, Take 17 g by mouth daily as needed for moderate constipation., Disp: , Rfl:    QUEtiapine (SEROQUEL) 50 MG tablet, Take 1 tablet (50 mg total) by mouth at bedtime., Disp: , Rfl:    sennosides-docusate sodium (SENOKOT-S) 8.6-50 MG tablet, Take 1 tablet by mouth daily., Disp: , Rfl:    timolol (TIMOPTIC) 0.5 % ophthalmic solution, Place 1 drop into both eyes daily., Disp: , Rfl:     LKW: Unclear tpa given?: No, unclear last known well IR Thrombectomy? No, poor baseline Modified Rankin Scale: 4-Needs assistance to walk and tend to bodily needs Time of teleneurologist evaluation: 12:41 PM Page received 12:40 PM  Exam: Vitals:   07/26/22 1254  SpO2: 98%   Neurological exam Patient is completely obtunded Does not open eyes to voice To noxious stimulation has some eye opening Resists active eye opening  while checking the pupils Pupils are equal and gazes midline Face does not appear to exhibit any form of asymmetry To noxious stimulation he has minimal localization in both upper extremities and has good withdrawal in bilateral lower extremities along with appropriate grimace to noxious stimulation is observed on the camera, when the nurse provided the noxious stimulation.    NIHSS 1A: Level of Consciousness - 2 1B: Ask Month and Age - 2 1C: 'Blink Eyes' & 'Squeeze Hands' - 2 2: Test Horizontal Extraocular Movements - 0 3: Test Visual Fields - 0 4: Test Facial Palsy - 0 5A: Test Left Arm Motor Drift - 3 5B: Test Right Arm Motor Drift - 3 6A: Test Left Leg Motor Drift - 3 6B: Test Right Leg Motor Drift - 3 7: Test Limb Ataxia - 0 8: Test Sensation - 0 9: Test Language/Aphasia- 3 10: Test Dysarthria - 2 11: Test Extinction/Inattention -  0 NIHSS score: 23   Imaging Reviewed:  CT head: No acute changes.  CT angiogram head and neck with no emergent LVO.  Patent anterior and posterior relation.  CT perfusion study done because of unknown last known well-no perfusion deficit.  MR brain from earlier in February - no stroke.  Labs reviewed in epic and pertinent values follow: Today's labs pending.  Prior labs as below CBC    Component Value Date/Time   WBC 7.1 07/10/2022 0301   RBC 4.75 07/10/2022 0301   HGB 14.3 07/10/2022 0301   HCT 42.5 07/10/2022 0301   PLT 181 07/10/2022 0301   MCV 89.5 07/10/2022 0301   MCH 30.1 07/10/2022 0301   MCHC 33.6 07/10/2022 0301   RDW 13.1 07/10/2022 0301   LYMPHSABS 2.2 07/06/2022 1350   MONOABS 0.6 07/06/2022 1350   EOSABS 0.1 07/06/2022 1350   BASOSABS 0.1 07/06/2022 1350   CMP     Component Value Date/Time   NA 141 07/10/2022 0301   NA 141 05/10/2015 1116   K 3.6 07/10/2022 0301   CL 105 07/10/2022 0301   CO2 28 07/10/2022 0301   GLUCOSE 135 (H) 07/10/2022 0301   BUN 19 07/10/2022 0301   BUN 16 05/10/2015 1116   CREATININE 0.84  07/10/2022 0301   CALCIUM 10.0 07/10/2022 0301   PROT 6.5 07/06/2022 1350   PROT 6.5 05/10/2015 1116   ALBUMIN 3.7 07/06/2022 1350   ALBUMIN 4.0 05/10/2015 1116   AST 23 07/06/2022 1350   ALT 5 07/06/2022 1350   ALKPHOS 43 07/06/2022 1350   BILITOT 1.1 07/06/2022 1350   BILITOT 0.5 05/10/2015 1116   GFRNONAA >60 07/10/2022 0301   GFRAA >60 08/17/2016 0514     Assessment: 88 year old man with past medical history of Parkinson's, agent orange exposure, Parkinson's dementia with behavioral disturbance, hyperlipidemia, multiple skin cancers, resident of a nursing home where he was currently residing after hospitalization for frequent falls and a recent diagnosis of a pulmonary embolus for which she was initially anticoagulated with heparin but later discharged only on aspirin due to frequent falls, brought in for unresponsiveness. Wife saw him drooling and unresponsive in his chair.  Unclear last known well time.  Not a candidate for IV thrombolysis due to nonfocal exam On examination, he exhibits resistance to eye opening and no real focality other than just not responding to voice raising suspicion for an underlying behavioral etiology but given his PE, he could have had a large vessel occlusion which could be a right or left MCA occlusion as well as a basilar occlusion.  I performed a noncontrasted CT which was unremarkable.  I also ordered a stat CT angiogram head and neck-which revealed patent anterior and posterior circulation.  CT perfusion study was done 1 due to the lack of clear last known well and secondly due to concern for a middle cerebral artery occlusion, which was also negative for perfusion deficits He also has a history of seizures but none was witnessed.  Given his advanced age, Parkinson's, and dementia with behavioral disturbance, I would not start him on any antiepileptics unless I am [id number] certain that he does have seizures. At this time, I think it is symptoms are either  related to an unwitnessed seizure with a postictal state versus some sort of toxic metabolic encephalopathy versus a behavioral component to unresponsiveness.  Impression Unresponsiveness-less likely stroke, evaluate for underlying infections Parkinson's with dementia and behavioral disturbance-question if that has some bearing on today's presentation Has  a history of seizures-evaluate for underlying seizure activity.  Would not initiate seizure treatment unless I have good data electrographically or clinically to suspect seizures.   Recommendations:  I have ordered an MRI of the brain without contrast as well as an EEG. If he does not start to come around and all the workup is unremarkable, I would recommend admitting him to [facility] for possible LTM EEG to make sure that there is no evidence of nonconvulsive status epilepticus which might explain his mentation. For now I would recommend checking basic labs with CBC, BMP, urinalysis, chest x-ray, ammonia levels, TSH and B12. I would recommend frequent neurochecks-every 2 hours.  My suspicion for stroke is low but I will obtain an MRI nonetheless.   Not a candidate for IV thrombolysis due to unclear last known well and nonfocal exam.  Not a candidate for endovascular thrombectomy due to poor baseline and low suspicion for stroke as well as CT angio imaging ultimately showing no evidence of large vessel occlusion.  Plan was discussed with Dr. Roderic Palau over the phone and patient's wife on the camera.   Please feel free to call back to discuss plan once the ER workup is complete.  -- Amie Portland, MD Neurologist [facility] Pager: [phone]

## 2022-07-26 NOTE — Assessment & Plan Note (Addendum)
Encephalopathy type as yet unspecified.  Currently nearly obtunded barely responsive to sternal rub.  Likely sudden intracranial event as etiology.  History of seizures, has had 3 prior episodes of unresponsiveness.  Was on lamotrigine for a time, this was discontinued by his neurologist due to intolerance.  Was placed on gabapentin 100 daily.  MRI brain motion degraded, hypodensity in globus pallidus bilaterally- artifactual, acute hypoxic/ischemic or toxic metabolic insult not excluded, punctate acute infarct in right cerebellum. -Neurologist consulted,Admit to [facility], needs continuous EEG -NPO - 1 L bolus given, continue D5+ N/s + 20 kcl 75cc/hr x 1 day -CBG twice daily -Hold gabapentin, Sinemet, Seroquel while n.p.o. -Care Order instruction to page neurology on arrival to [facility] -Recent echo 07/08/2022, EF of 50 to 55%, G1DD, will defer need for full stroke workup to neurology

## 2022-07-26 NOTE — ED Notes (Signed)
DNR status confirmed with spouse, armband placed on patient.

## 2022-07-27 DIAGNOSIS — G20A1 Parkinson's disease without dyskinesia, without mention of fluctuations: Secondary | ICD-10-CM | POA: Diagnosis not present

## 2022-07-27 DIAGNOSIS — R4182 Altered mental status, unspecified: Secondary | ICD-10-CM | POA: Diagnosis not present

## 2022-07-27 DIAGNOSIS — G0430 Acute necrotizing hemorrhagic encephalopathy, unspecified: Secondary | ICD-10-CM | POA: Diagnosis not present

## 2022-07-27 DIAGNOSIS — G9340 Encephalopathy, unspecified: Secondary | ICD-10-CM | POA: Diagnosis not present

## 2022-07-27 LAB — LIPID PANEL
Cholesterol: 110 mg/dL (ref 0–200)
HDL: 23 mg/dL — ABNORMAL LOW (ref >40)
LDL Cholesterol: 67 mg/dL (ref 0–99)
Total CHOL/HDL Ratio: 4.8 RATIO
Triglycerides: 98 mg/dL (ref <150)
VLDL: 20 mg/dL (ref 0–40)

## 2022-07-27 LAB — GLUCOSE, CAPILLARY
Glucose-Capillary: 129 mg/dL — ABNORMAL HIGH (ref 70–99)
Glucose-Capillary: 141 mg/dL — ABNORMAL HIGH (ref 70–99)
Glucose-Capillary: 121 mg/dL — ABNORMAL HIGH (ref 70–99)

## 2022-07-27 MED ORDER — TIMOLOL MALEATE 0.5 % OP SOLN
1.0000 [drp] | Freq: Every day | OPHTHALMIC | Status: DC
  Administered 2022-07-27: 1 [drp] via OPHTHALMIC
  Filled 2022-07-27: qty 5

## 2022-07-27 MED ORDER — LISINOPRIL 10 MG PO TABS: 10.0000 mg | ORAL_TABLET | Freq: Every day | ORAL | 0 refills | Status: DC

## 2022-07-27 MED ORDER — LISINOPRIL 10 MG PO TABS
10.0000 mg | ORAL_TABLET | Freq: Every day | ORAL | Status: DC
  Administered 2022-07-27: 10 mg via ORAL
  Filled 2022-07-27: qty 1

## 2022-07-27 MED ORDER — GABAPENTIN 100 MG PO CAPS
100.0000 mg | ORAL_CAPSULE | Freq: Every day | ORAL | Status: DC
  Administered 2022-07-27: 100 mg via ORAL
  Filled 2022-07-27: qty 1

## 2022-07-27 MED ORDER — HYDROCHLOROTHIAZIDE 12.5 MG PO TABS
6.2500 mg | ORAL_TABLET | Freq: Every day | ORAL | Status: DC
  Administered 2022-07-27 – 2022-07-28 (×2): 6.25 mg via ORAL
  Filled 2022-07-27 (×2): qty 1

## 2022-07-27 MED ORDER — LATANOPROST 0.005 % OP SOLN
1.0000 [drp] | Freq: Every day | OPHTHALMIC | Status: DC
  Administered 2022-07-27: 1 [drp] via OPHTHALMIC
  Filled 2022-07-27: qty 2.5

## 2022-07-27 MED ORDER — CARBIDOPA-LEVODOPA 25-100 MG PO TABS
2.0000 | ORAL_TABLET | Freq: Four times a day (QID) | ORAL | Status: DC
  Administered 2022-07-27 – 2022-07-28 (×4): 2 via ORAL
  Filled 2022-07-27 (×4): qty 2

## 2022-07-27 NOTE — Care Management Obs Status (Signed)
[place] NOTIFICATION   Patient Details  Name: Joshua Everett MRN: [id number] Date of Birth: January 28, 1935   Medicare Observation Status Notification Given:  Yes    Geralynn Ochs, LCSW 07/27/2022, 3:09 PM

## 2022-07-27 NOTE — TOC Initial Note (Signed)
Transition of Care [facility]) - Initial/Assessment Note    Patient Details  Name: Joshua Everett MRN: [id number] Date of Birth: 1935-01-28  Transition of [facility] CM/SW Contact:    Geralynn Ochs, LCSW Phone Number: 07/27/2022, 3:12 PM  Clinical Narrative:          CSW spoke with patient's spouse, Joshua Everett, about discharge plans. Plan is for patient to move into a facility in [place], and family will take the patient home while they are working towards that. Wife asking for tomorrow to get the house ready. CSW updated MD, in agreement. CSW to follow.         Expected Discharge Plan: Home/Self Care Barriers to Discharge: Continued Medical Work up   Patient Goals and CMS Choice Patient states their goals for this hospitalization and ongoing recovery are:: patient unable to participate in goal setting, not fully oriented CMS Medicare.gov Compare Post Acute Care list provided to:: Patient Represenative (must comment) Choice offered to / list presented to : [place] ownership interest in [facility].provided to:: Spouse    Expected Discharge Plan and Services     Post Acute Care Choice: NA Living arrangements for the past 2 months: [place], [place]                                      Prior Living Arrangements/Services Living arrangements for the past 2 months: [place], [place] Lives with:: Spouse Patient language and need for interpreter reviewed:: No Do you feel safe going back to the place where you live?: Yes      Need for Family Participation in Patient Care: Yes (Comment) Care giver support system in place?: Yes (comment)   Criminal Activity/Legal Involvement Pertinent to Current Situation/Hospitalization: No - Comment as needed  Activities of Daily Living      Permission Sought/Granted Permission sought to share information with : Family Supports Permission granted to share  information with : Yes, Verbal Permission Granted  Share Information with NAME: Joshua Everett     Permission granted to share info w Relationship: Spouse     Emotional Assessment Appearance:: Appears stated age Attitude/Demeanor/Rapport: Unable to Assess Affect (typically observed): Unable to Assess Orientation: : Oriented to Self Alcohol / Substance Use: Not Applicable Psych Involvement: No (comment)  Admission diagnosis:  Acute encephalopathy [G93.40] Parkinson's disease without dyskinesia, unspecified whether manifestations fluctuate [G20.A1] Patient Active Problem List   Diagnosis Date Noted   Acute encephalopathy 07/26/2022   Pulmonary embolism ([place]) 07/12/2022   Delirium 07/12/2022   Obesity (BMI 30-39.9) 07/12/2022   Cerumen impaction 06/19/2022   Scalp laceration, subsequent encounter 05/11/2022   Recurrent falls 05/11/2022   AMS (altered mental status) 04/08/2022   Syncope 06/22/2020   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[place]) 06/22/2020   Pedal edema 08/29/2019   Medicare annual wellness visit, subsequent 01/13/2019   Parkinson disease 03/12/2018   Abnormal gait 01/04/2018   Overactive bladder 05/21/2017   Chronic pain of right knee 01/01/2017   Chest pain with moderate risk for cardiac etiology 08/17/2016   Advanced care planning/counseling discussion 01/23/2016   Prediabetes 07/31/2015   Rosacea 07/06/2015   Personal history of other malignant neoplasm of skin 11/19/2014   Abdominal wall hernia 09/08/2014   BPH without obstruction/lower urinary tract symptoms 09/08/2014   Chronic constipation 09/08/2014   Dyslipidemia 09/08/2014   Glaucoma 09/08/2014  GERD (gastroesophageal reflux disease) 09/08/2014   Essential hypertension 09/08/2014   Overweight (BMI 25.0-29.9) 09/08/2014   Nocturia 11/13/2012   PCP:  Ria Bush, MD Pharmacy:   [facility] [place], [place] [address] Phone: [phone] Fax: [phone]  [facility] [place], [place] [address] Phone: [phone] Fax: [phone]  [facility] [place], [place] [address] [place] [address] Phone: [phone] Fax: [phone]     Social Determinants of Health (SDOH) Social History: [place]: No Food Insecurity (07/06/2022)  Housing: Low Risk  (07/06/2022)  Transportation Needs: No Transportation Needs (07/06/2022)  Utilities: Not At Risk (07/06/2022)  Alcohol Screen: Low Risk  (01/27/2022)  Depression (PHQ2-9): Medium Risk (06/19/2022)  Financial Resource Strain: Low Risk  (01/27/2022)  Physical Activity: Insufficiently Active (01/27/2022)  Social Connections: Moderately Isolated (01/27/2022)  Stress: No Stress Concern Present (01/27/2022)  Tobacco Use: Medium Risk (07/06/2022)   SDOH Interventions:     Readmission Risk Interventions     No data to display

## 2022-07-27 NOTE — Plan of Care (Signed)
EEG personally reviewed and reviewed with Dr. Hortense Ramal, no seizure activity, though there is some artifact.  Long discussion with wife at bedside.  She notes that his seizures at home were loud groans followed by being unconscious for several minutes followed by being sleepy for many hours.  These improved with administration of gabapentin and lamotrigine although he did not tolerate high doses of these medications; he is on a very low-dose.  Would not change these medications at this time as my primary index of suspicion is for neuroleptic induced cognitive change given recent start of Seroquel as well as some Haldol given at facility.  Would try to minimize dose of Seroquel is much as possible.  Pimavanserin could be considered as an alternative though often cost prohibitive.   For punctate stroke, given this is an incidental finding and given his baseline would continue aspirin 81 mg daily and not start Plavix at this time  ECHO 07/28/2022  1. Left ventricular ejection fraction, by estimation, is 50 to 55%. The  left ventricle has low normal function. The left ventricle has no regional  wall motion abnormalities. There is moderate concentric left ventricular  hypertrophy. Left ventricular  diastolic parameters are consistent with Grade I diastolic dysfunction  (impaired relaxation).   2. Right ventricular systolic function is normal. The right ventricular  size is normal.   3. Left atrial size was moderately dilated.   4. Right atrial size was mildly dilated.   5. The mitral valve is normal in structure. Trivial mitral valve  regurgitation. No evidence of mitral stenosis.   6. The aortic valve is normal in structure. Aortic valve regurgitation is  not visualized. Aortic valve sclerosis is present, with no evidence of  aortic valve stenosis.   7. The inferior vena cava is normal in size with greater than 50%   Lab Results  Component Value Date   [facility] 67 07/27/2022   Lab Results   Component Value Date   HGBA1C 5.0 07/26/2022    Given left atrial enlargement on recent ECHO, consider event monitor on discharge if wife would want to consider anticoagulation for stroke prevention (may be high risk in this patient due to frequent falls)  Recommendations discussed with Dr. Avon Gully in person, neurology will be available as needed.   Lesleigh Noe MD-PhD [facility] [phone] Available 7 AM to 7 PM, outside these hours please contact Neurologist on call listed on AMION

## 2022-07-27 NOTE — Evaluation (Signed)
Occupational Therapy Evaluation Patient Details Name: Joshua Everett MRN: [id number] DOB: [birth year] Today's Date: 07/27/2022   History of Present Illness Pt is an 88 y.o. male who presented 07/26/22 from his SNF with obtundation and facial drooping. 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 EEG with findings suggestive of PLEDs, left hemisphere predominant, but also with periodic discharges intermittently on the right as well. PMH: Parkinson's dementia, glaucoma, GERD, HLD, prediabetes, veteran with agent orange exposure in [place] and [place], urinary incontinence   Clinical Impression   Patient admitted from SNF, spouse reports pt needing assist for transfers and spending most of his time in a wheelchair (but ambulating with therapy using RW and assist), assist for most ADLs but able to self feed and grooming.  He was admitted for above and presents with problem list below.  He is oriented to self, follows simple commands and demonstrates slow processing but has hx of dementia.  He completes bed mobility with mod assist +2, transfers with mod-max assist +2, and requires setup to total assist +2 for Adls. Believe he will best benefit from continued OT services acutely and after dc at SNF level rehab to optimize safety and decrease burden of care. Will follow acutely.      Recommendations for follow up therapy are one component of a multi-disciplinary discharge planning process, led by the attending physician.  Recommendations may be updated based on patient status, additional functional criteria and insurance authorization.   Follow Up Recommendations  Skilled nursing-short term rehab (<3 hours/day)     Assistance Recommended at Discharge Frequent or constant Supervision/Assistance  Patient can return home with the following Direct supervision/assist for financial management;Direct supervision/assist for medications management;Assistance with cooking/housework;Assist for  transportation;Help with stairs or ramp for entrance;A lot of help with bathing/dressing/bathroom;Two people to help with walking and/or transfers    Functional Status Assessment  Patient has had a recent decline in their functional status and demonstrates the ability to make significant improvements in function in a reasonable and predictable amount of time.  Equipment Recommendations  Other (comment) (defer)    Recommendations for Other Services       Precautions / Restrictions Precautions Precautions: Fall Precaution Comments: bil mitts Restrictions Weight Bearing Restrictions: No      Mobility Bed Mobility Overal bed mobility: Needs Assistance Bed Mobility: Supine to Sit, Sit to Supine     Supine to sit: +2 for physical assistance, +2 for safety/equipment, Mod assist, HOB elevated Sit to supine: Mod assist, +2 for physical assistance, +2 for safety/equipment, HOB elevated   General bed mobility comments: increased time to initate, assist for fully brining LEs towards EOB and elevated trunk    Transfers Overall transfer level: Needs assistance Equipment used: 2 person hand held assist Transfers: Sit to/from Stand Sit to Stand: Max assist, Mod assist, +2 physical assistance, +2 safety/equipment           General transfer comment: initial stand with max assist +2, fades to mod assist +2 on 2nd stand      Balance Overall balance assessment: History of Falls, Needs assistance Sitting-balance support: No upper extremity supported, Feet supported Sitting balance-Leahy Scale: Good Sitting balance - Comments: statically min guard to close supervision   Standing balance support: Bilateral upper extremity supported, During functional activity Standing balance-Leahy Scale: Poor Standing balance comment: relies on BUE and external support  ADL either performed or assessed with clinical judgement   ADL Overall ADL's : Needs  assistance/impaired     Grooming: Set up;Wash/dry face;Sitting           Upper Body Dressing : Moderate assistance;Sitting   Lower Body Dressing: Total assistance;+2 for physical assistance;+2 for safety/equipment;Sit to/from stand Lower Body Dressing Details (indicate cue type and reason): socks, +2 in standing   Toilet Transfer Details (indicate cue type and reason): unable to take side step, +2 max to mod to stand         Functional mobility during ADLs: Moderate assistance;Maximal assistance;+2 for physical assistance;+2 for safety/equipment;Rolling walker (2 wheels)       Vision Baseline Vision/History: 1 Wears glasses Vision Assessment?: No apparent visual deficits Additional Comments: pt able to scan L and R, correctly identify # of fingers     Perception     Praxis      Pertinent Vitals/Pain Pain Assessment Pain Assessment: Faces Faces Pain Scale: No hurt     Hand Dominance Right   Extremity/Trunk Assessment Upper Extremity Assessment Upper Extremity Assessment: Generalized weakness   Lower Extremity Assessment Lower Extremity Assessment: Defer to PT evaluation       Communication Communication Communication: No difficulties   Cognition Arousal/Alertness: Awake/alert Behavior During Therapy: Flat affect Overall Cognitive Status: History of cognitive impairments - at baseline                                 General Comments: pt with baseline dementia.  He is able to follow simple 1 step commands given increased time.     General Comments  spouse present and supportive, possible plans to move pt to [place] with more family support    Exercises     Shoulder Instructions      Home Living Family/patient expects to be discharged to:: Skilled nursing facility                                        Prior Functioning/Environment Prior Level of Function : Needs assist;History of Falls (last six months)              Mobility Comments: has been at rehab, in a wheelchair mostly. assist for transfers and limited mobility using RW with assist ADLs Comments: assists with ADLs from spouse and aide at facility. pt was still self feeding        OT Problem List: Decreased strength;Decreased activity tolerance;Impaired balance (sitting and/or standing);Decreased cognition;Decreased safety awareness;Decreased coordination;Decreased knowledge of precautions;Decreased knowledge of use of DME or AE      OT Treatment/Interventions: Self-care/ADL training;DME and/or AE instruction;Therapeutic exercise;Therapeutic activities;Patient/family education;Balance training    OT Goals(Current goals can be found in the care plan section) Acute Rehab OT Goals Patient Stated Goal: get better OT Goal Formulation: With family Time For Goal Achievement: 08/10/22 Potential to Achieve Goals: Fair  OT Frequency: Min 2X/week    Co-evaluation PT/OT/SLP Co-Evaluation/Treatment: Yes Reason for Co-Treatment: For patient/therapist safety;To address functional/ADL transfers;Necessary to address cognition/behavior during functional activity   OT goals addressed during session: ADL's and self-care      AM-PAC OT "6 Clicks" Daily Activity     Outcome Measure Help from another person eating meals?: A Little Help from another person taking care of personal grooming?: A Little Help from another person toileting, which includes  using toliet, bedpan, or urinal?: Total Help from another person bathing (including washing, rinsing, drying)?: A Lot Help from another person to put on and taking off regular upper body clothing?: A Lot Help from another person to put on and taking off regular lower body clothing?: Total 6 Click Score: 12   End of Session Equipment Utilized During Treatment: Gait belt Nurse Communication: Mobility status  Activity Tolerance: Patient tolerated treatment well Patient left: in chair;with call bell/phone within  reach;with bed alarm set;with family/visitor present;with restraints reapplied  OT Visit Diagnosis: Other abnormalities of gait and mobility (R26.89);Muscle weakness (generalized) (M62.81);Other symptoms and signs involving cognitive function;History of falling (Z91.81)                Time: [id number] OT Time Calculation (min): 23 min Charges:  OT General Charges $OT Visit: 1 Visit OT Evaluation $OT Eval Moderate Complexity: 1 Mod  Jolaine Artist, OT Acute Rehabilitation Services Office [phone]   Delight Stare 07/27/2022, 10:42 AM

## 2022-07-27 NOTE — Procedures (Signed)
Patient Name: Joshua Everett  MRN: [id number]  Epilepsy Attending: Lora Havens  Referring Physician/Provider: Amie Portland, MD  Date: 07/26/2022 Duration: 30.58 mins  Patient history: 88 year old male with AMS. EEG to evaluate for seizure   Level of alertness: Awake, asleep  AEDs during EEG study: None  Technical aspects: This EEG study was done with scalp electrodes positioned according to the 10-20 International system of electrode placement. Electrical activity was reviewed with band pass filter of 1-70Hz$ , sensitivity of 7 uV/mm, display speed of 26m/sec with a 60Hz$  notched filter applied as appropriate. EEG data were recorded continuously and digitally stored.  Video monitoring was available and reviewed as appropriate.  Description: The posterior dominant rhythm consists of 7 Hz activity of moderate voltage (25-35 uV) seen predominantly in posterior head regions, symmetric and reactive to eye opening and eye closing. Sleep was characterized by vertex waves, sleep spindles (12 to 14 Hz), maximal frontocentral region. EEG showed continuous generalized predominantly 5 to 7 Hz theta-delta slowing admixed with intermittent generalized 2-3Hz$  delta slowing. Physiologic photic driving was not seen during photic stimulation.  Hyperventilation was not performed.     ABNORMALITY - Continuous slow, generalized - Background slow  IMPRESSION: This study is suggestive of moderate diffuse encephalopathy, nonspecific etiology. No seizures or epileptiform discharges were seen throughout the recording.  Sunya Humbarger OBarbra Sarks

## 2022-07-27 NOTE — Discharge Summary (Signed)
Physician Discharge Summary  Joshua Everett [id number] DOB: Dec 03, 1934 DOA: 07/26/2022  PCP: Ria Bush, MD  Admit date: 07/26/2022 Discharge date: 07/27/2022  Admitted From: Facility Disposition:  Same  Recommendations for Outpatient Follow-up:  Follow up with PCP in 1-2 weeks Follow-up with neurology as scheduled  Discharge Condition: Stable  CODE STATUS: DNR Diet recommendation: As tolerated, would liberalize diet to ensure appropriate caloric intake  Brief/Interim Summary: Joshua Everett is a 88 y.o. male with medical history significant for Parkinsons/Dementia who at baseline can recognize wife and answer simple questions but is not Aox4 or hold a conversation, HTN, PE. Patient was brought to the ED from nursing home with reports of altered mental status, confusion, and poor responsiveness - recently discharged to SNF on 07/14/22 for recurrent falls, small subsegmental PE requiring ongoing PT.   Presented with acute metabolic encephalopathy - neurology consulted given concern over possible intracranial etiology. MRI limited but without overt findings. EEG unremarkable for epileptiform activity. Concern for worsening parkinsons/dementia with underlying delirium exacerbated by polypharmacy -recommend avoiding haldol and would consider decreasing dose of seroquel in the future.  Neurology discussed pimavanserin could be considered as an alternative if episodes continue despite current regimen. Punctate stroke on MRI likely incidental given motion degradation and lack of concurrent physical exam findings.  Discussed possible event monitor at discharge however given patient is not amenable to anticoagulation due to increased fall risk event monitor would not change clinical course.  Patient medically stable this time for discharge, wife indicates she will not allow patient to be discharged back to prior facility due to concern over mistreatment and mismanagement.  She is following  with facility in [place] near her children for possible discharge, case management involved to assist with disposition.  Discharge Diagnoses:  Principal Problem:   Acute encephalopathy Active Problems:   BPH without obstruction/lower urinary tract symptoms   Essential hypertension   Parkinson disease   Pulmonary embolism (HCC)  Acute encephalopathy Likely polypharmacy Cannot rule out seizure Acute punctate CVA -MRI brain motion degraded, hypodensity in globus pallidus bilaterally- artifactual, acute hypoxic/ischemic or toxic metabolic insult not excluded, punctate acute infarct in right cerebellum. -Neurologist consulted as above, no medication changes recommended other than weaning Seroquel and Haldol use -Okay to resume gabapentin, Sinemet - continue to hold Seroquel and monitor -Care Order instruction to page neurology on arrival to [facility] -Recent echo 07/08/2022, EF of 50 to 55%, G1DD, will defer need for full stroke workup to neurology   Pulmonary embolism ([place]) Diagnosed with PE 07/07/22- CTA- isolated segmental PE in right lower lobe, small overall clot burden PE 07/07/2022, initially placed on heparin drip, this was discontinued due 2 history of frequent falls.  He was discharged on aspirin.   Parkinson disease Parkinson's with baseline dementia.  Discharged to nursing home 2/9 after recent hospitalization.  At baseline can answer simple questions, recognizes family.  Uses wheelchair at baseline.   Essential hypertension Stable. -Resume lisinopril, HCTZ -As needed hydralazine for systolic greater than [id number]   BPH without obstruction/lower urinary tract symptoms Foley placed in ED - trial removal in the next 24h  Discharge Instructions   Allergies as of 07/27/2022       Reactions   Solifenacin Other (See Comments)   Hallucinations (per uro)        Medication List     STOP taking these medications    QUEtiapine 50 MG tablet Commonly known as: SEROquel        TAKE these medications  aspirin EC 81 MG tablet Take 81 mg by mouth daily. Swallow whole.   bimatoprost 0.01 % Soln Commonly known as: LUMIGAN Place 1 drop into both eyes at bedtime.   carbidopa-levodopa 25-100 MG tablet Commonly known as: SINEMET IR Take 2 tablets by mouth 4 (four) times daily.   cholecalciferol 1000 units tablet Commonly known as: VITAMIN D Take 1,000 Units by mouth daily.   gabapentin 100 MG capsule Commonly known as: NEURONTIN Take 100 mg by mouth at bedtime.   hydrochlorothiazide 12.5 MG tablet Commonly known as: HYDRODIURIL Take 0.5 tablets (6.25 mg total) by mouth daily.   lisinopril 10 MG tablet Commonly known as: ZESTRIL Take 1 tablet (10 mg total) by mouth daily. What changed: when to take this   MULTIVITAMIN ADULT PO Take 1 tablet by mouth daily.   polyethylene glycol powder 17 GM/SCOOP powder Commonly known as: GLYCOLAX/MIRALAX Take 17 g by mouth daily as needed for moderate constipation.   sennosides-docusate sodium 8.6-50 MG tablet Commonly known as: SENOKOT-S Take 1 tablet by mouth daily.   timolol 0.5 % ophthalmic solution Commonly known as: TIMOPTIC Place 1 drop into both eyes daily.   VITAMIN B 12 PO Take by mouth daily.        Allergies  Allergen Reactions   Solifenacin Other (See Comments)    Hallucinations (per uro)    Consultations: Neuro  Procedures/Studies: EEG adult  Result Date: 26-Aug-2022 Lora Havens, MD     2022/08/26  8:42 AM Patient Name: Joshua Everett MRN: [id number] Epilepsy Attending: Lora Havens Referring Physician/Provider: Amie Portland, MD Date: 07/26/2022 Duration: 30.58 mins Patient history: 88 year old male with AMS. EEG to evaluate for seizure Level of alertness: Awake, asleep AEDs during EEG study: None Technical aspects: This EEG study was done with scalp electrodes positioned according to the 10-20 International system of electrode placement. Electrical activity was reviewed  with band pass filter of 1-70Hz$ , sensitivity of 7 uV/mm, display speed of 84m/sec with a 60Hz$  notched filter applied as appropriate. EEG data were recorded continuously and digitally stored.  Video monitoring was available and reviewed as appropriate. Description: The posterior dominant rhythm consists of 7 Hz activity of moderate voltage (25-35 uV) seen predominantly in posterior head regions, symmetric and reactive to eye opening and eye closing. Sleep was characterized by vertex waves, sleep spindles (12 to 14 Hz), maximal frontocentral region. EEG showed continuous generalized predominantly 5 to 7 Hz theta-delta slowing admixed with intermittent generalized 2-3Hz$  delta slowing. Physiologic photic driving was not seen during photic stimulation.  Hyperventilation was not performed.   ABNORMALITY - Continuous slow, generalized - Background slow IMPRESSION: This study is suggestive of moderate diffuse encephalopathy, nonspecific etiology. No seizures or epileptiform discharges were seen throughout the recording. PLora Havens  DG Humerus Left  Result Date: 07/26/2022 CLINICAL DATA:  Left arm bruising, initial encounter EXAM: LEFT HUMERUS - 2+ VIEW COMPARISON:  None Available. FINDINGS: Mild degenerative changes of the acromioclavicular joint are seen. No acute fracture or dislocation in the humerus is seen. No soft tissue abnormality is noted. IMPRESSION: No acute abnormality noted. Electronically Signed   By: MInez CatalinaM.D.   On: 07/26/2022 19:12   DG Chest Port 1 View  Result Date: 07/26/2022 CLINICAL DATA:  Mental status change. EXAM: PORTABLE CHEST 1 VIEW COMPARISON:  05/04/2022 FINDINGS: Poor inspiration. Heart size upper limits of normal. Chronic aortic atherosclerosis. Poor aeration of the lung bases, presumably subsequent to the poor inspiration. Cannot rule out  mild basilar infiltrate/atelectasis, but the findings may just be subsequent to the poor inspiration. IMPRESSION: Poor aeration of  the lung bases, presumably subsequent to the poor inspiration. Cannot rule out mild basilar infiltrate/atelectasis. Electronically Signed   By: Nelson Chimes M.D.   On: 07/26/2022 15:40   MR BRAIN WO CONTRAST  Result Date: 07/26/2022 CLINICAL DATA:  Delirium EXAM: MRI HEAD WITHOUT CONTRAST TECHNIQUE: Multiplanar, multiecho pulse sequences of the brain and surrounding structures were obtained without intravenous contrast. COMPARISON:  MRI head February 2024. FINDINGS: Moderate to severely motion limited study.  Within this limitation: Brain: Significantly motion limited study with somewhat linear appearing DWI hyperintensity and FLAIR hyperintensity in the globus palladi bilaterally. Additional more convincing acute infarct in the right cerebellum with punctate focus of restricted diffusion on series 9, image 10. No evidence of acute hemorrhage, mass lesion, midline shift or hydrocephalus. Cerebral atrophy. Remote right cerebellar infarct. Vascular: Major arterial flow voids are maintained at the skull base. Skull and upper cervical spine: Normal marrow signal. Motion limited. Sinuses/Orbits: Negative. Other: No mastoid effusions. IMPRESSION: 1. Moderate to severely motion limited study with somewhat linear appearing DWI hyperintensity and possible FLAIR hyperintensity in the globus palladi bilaterally. While this could be artifactual, acute hypoxic/ischemic or toxic/metabolic (including carbon monoxide) insult is not excluded. If the patient does not improve clinically, a follow-up MRI could assess for change. 2. Punctate acute infarct in the right cerebellum. No significant MR mass effect. Electronically Signed   By: Margaretha Sheffield M.D.   On: 07/26/2022 15:03   CT ANGIO HEAD NECK W WO CM W PERF (CODE STROKE)  Result Date: 07/26/2022 CLINICAL DATA:  Neuro deficit, acute, stroke suspected EXAM: CT ANGIOGRAPHY HEAD AND NECK CT PERFUSION BRAIN TECHNIQUE: Multidetector CT imaging of the head and neck was  performed using the standard protocol during bolus administration of intravenous contrast. Multiplanar CT image reconstructions and MIPs were obtained to evaluate the vascular anatomy. Carotid stenosis measurements (when applicable) are obtained utilizing NASCET criteria, using the distal internal carotid diameter as the denominator. Multiphase CT imaging of the brain was performed following IV bolus contrast injection. Subsequent parametric perfusion maps were calculated using RAPID software. RADIATION DOSE REDUCTION: This exam was performed according to the departmental dose-optimization program which includes automated exposure control, adjustment of the mA and/or kV according to patient size and/or use of iterative reconstruction technique. CONTRAST:  135m OMNIPAQUE IOHEXOL 350 MG/ML SOLN COMPARISON:  Head CT immediately prior.  MRI 07/06/2022 FINDINGS: CTA NECK FINDINGS Aortic arch: Mild aortic atherosclerotic calcification. Branching pattern is normal without origin stenosis. Right carotid system: Common carotid artery widely patent to the bifurcation. Minimal plaque at the bifurcation but no stenosis. Cervical ICA widely patent. Left carotid system: Common carotid artery widely patent to the bifurcation. Bifurcation is normal. Cervical ICA is normal. Vertebral arteries: No proximal subclavian stenosis. Both vertebral artery origins are widely patent. Both vertebral arteries are normal through the cervical region to the foramen magnum. Skeleton: Ordinary chronic cervical spondylosis. Other neck: No mass or lymphadenopathy. Upper chest: Lung apices are clear. Review of the MIP images confirms the above findings CTA HEAD FINDINGS Anterior circulation: Both internal carotid arteries are patent through the skull base and siphon regions. There is ordinary siphon atherosclerotic calcification but no stenosis. The anterior and middle cerebral arteries are patent. No large vessel occlusion or proximal stenosis. No  aneurysm or vascular malformation. Distal branch vessels do show some atherosclerotic irregularity. Posterior circulation: Both vertebral arteries are patent through the foramen  magnum to the basilar artery. There is calcified plaque in both vertebral artery V4 segments. No stenosis on the right. 30-50% stenosis on the left. No basilar artery stenosis. Superior cerebellar and posterior cerebral arteries are patent. Venous sinuses: Patent and normal. Anatomic variants: Azygous anterior cerebral artery. Review of the MIP images confirms the above findings CT Brain Perfusion Findings: ASPECTS: 10 allowing for old strokes. CBF (<30%) Volume: 47m Perfusion (Tmax>6.0s) volume: 01mMismatch Volume: 46m246mnfarction Location:None IMPRESSION: 1. Negative perfusion study. No evidence of core infarction or penumbra. 2. No intracranial large vessel occlusion or flow limiting proximal stenosis. Distal vessel atherosclerotic irregularity. 3. Mild/minimal atherosclerotic change of the aorta and carotid bifurcations but without stenosis. 4. 30-50% stenosis of the left vertebral artery V4 segment. Aortic Atherosclerosis (ICD10-I70.0). Electronically Signed   By: MarNelson ChimesD.   On: 07/26/2022 13:21   CT HEAD CODE STROKE WO CONTRAST  Result Date: 07/26/2022 CLINICAL DATA:  Code stroke. Neuro deficit, acute, stroke suspected. Altered mental status. EXAM: CT HEAD WITHOUT CONTRAST TECHNIQUE: Contiguous axial images were obtained from the base of the skull through the vertex without intravenous contrast. RADIATION DOSE REDUCTION: This exam was performed according to the departmental dose-optimization program which includes automated exposure control, adjustment of the mA and/or kV according to patient size and/or use of iterative reconstruction technique. COMPARISON:  Head CT and MRI 07/06/2022 FINDINGS: Brain: There is no evidence of an acute infarct, intracranial hemorrhage, mass, midline shift, or extra-axial fluid collection.  Chronic right frontal and cerebellar infarcts are again noted. Patchy hypodensities in the cerebral white matter bilaterally are unchanged and nonspecific but compatible with moderate chronic small vessel ischemic disease. There is moderate cerebral atrophy. Vascular: Calcified atherosclerosis at the skull base. No hyperdense vessel. Skull: No fracture or suspicious osseous lesion. Sinuses/Orbits: Paranasal sinuses and mastoid air cells are clear. Unremarkable orbits. Other: None. These results were called by telephone at the time of interpretation on 07/26/2022 at 12:59 p.m. to Dr. JosMilton Fergusonho verbally acknowledged these results. IMPRESSION: 1. No evidence of acute intracranial abnormality. 2. Moderate chronic small vessel ischemic disease and cerebral atrophy. 3. Chronic right frontal and cerebellar infarcts. Electronically Signed   By: AllLogan BoresD.   On: 07/26/2022 13:00   ECHOCARDIOGRAM COMPLETE  Result Date: 07/08/2022    ECHOCARDIOGRAM REPORT   Patient Name:   Joshua Everett Date of Exam: 07/08/2022 Medical Rec #:  [id number]       Height:       70.0 in Accession #:    [id number]      Weight:       210.3 lb Date of Birth:  3/706/04/1935        BSA:          2.132 m Patient Age:    [age]           BP:           155/64 mmHg Patient Gender: M                  HR:           52 bpm. Exam Location:  [facility] Procedure: 2D Echo Indications:     Stroke I63.9  History:         Patient has no prior history of Echocardiogram examinations.                  Stroke.  Sonographer:  Wayland Salinas Referring Phys:  [place] Diagnosing Phys: Neoma Laming  Sonographer Comments: Technically difficult study due to poor echo windows, suboptimal parasternal window and suboptimal subcostal window. Image acquisition challenging due to patient body habitus and Image acquisition challenging due to respiratory motion. SSN not visualized IMPRESSIONS  1. Left ventricular ejection fraction, by estimation, is 50  to 55%. The left ventricle has low normal function. The left ventricle has no regional wall motion abnormalities. There is moderate concentric left ventricular hypertrophy. Left ventricular diastolic parameters are consistent with Grade I diastolic dysfunction (impaired relaxation).  2. Right ventricular systolic function is normal. The right ventricular size is normal.  3. Left atrial size was moderately dilated.  4. Right atrial size was mildly dilated.  5. The mitral valve is normal in structure. Trivial mitral valve regurgitation. No evidence of mitral stenosis.  6. The aortic valve is normal in structure. Aortic valve regurgitation is not visualized. Aortic valve sclerosis is present, with no evidence of aortic valve stenosis.  7. The inferior vena cava is normal in size with greater than 50% respiratory variability, suggesting right atrial pressure of 3 mmHg. FINDINGS  Left Ventricle: Left ventricular ejection fraction, by estimation, is 50 to 55%. The left ventricle has low normal function. The left ventricle has no regional wall motion abnormalities. The left ventricular internal cavity size was normal in size. There is moderate concentric left ventricular hypertrophy. Left ventricular diastolic parameters are consistent with Grade I diastolic dysfunction (impaired relaxation). Right Ventricle: The right ventricular size is normal. No increase in right ventricular wall thickness. Right ventricular systolic function is normal. Left Atrium: Left atrial size was moderately dilated. Right Atrium: Right atrial size was mildly dilated. Pericardium: There is no evidence of pericardial effusion. Mitral Valve: The mitral valve is normal in structure. Trivial mitral valve regurgitation. No evidence of mitral valve stenosis. Tricuspid Valve: The tricuspid valve is normal in structure. Tricuspid valve regurgitation is trivial. No evidence of tricuspid stenosis. Aortic Valve: The aortic valve is normal in structure.  Aortic valve regurgitation is not visualized. Aortic valve sclerosis is present, with no evidence of aortic valve stenosis. Aortic valve peak gradient measures 5.4 mmHg. Pulmonic Valve: The pulmonic valve was normal in structure. Pulmonic valve regurgitation is not visualized. No evidence of pulmonic stenosis. Aorta: The aortic root is normal in size and structure. Venous: The inferior vena cava is normal in size with greater than 50% respiratory variability, suggesting right atrial pressure of 3 mmHg. IAS/Shunts: No atrial level shunt detected by color flow Doppler.  LEFT VENTRICLE PLAX 2D LVIDd:         5.20 cm   Diastology LVIDs:         3.70 cm   LV e' medial:    5.66 cm/s LV PW:         1.30 cm   LV E/e' medial:  13.7 LV IVS:        1.30 cm   LV e' lateral:   6.96 cm/s LVOT diam:     2.30 cm   LV E/e' lateral: 11.1 LVOT Area:     4.15 cm  RIGHT VENTRICLE RV S prime:     16.40 cm/s TAPSE (M-mode): 3.2 cm LEFT ATRIUM             Index        RIGHT ATRIUM           Index LA diam:        4.20  cm 1.97 cm/m   RA Area:     19.60 cm LA Vol (A2C):   53.9 ml 25.28 ml/m  RA Volume:   49.40 ml  23.17 ml/m LA Vol (A4C):   84.6 ml 39.67 ml/m LA Biplane Vol: 66.7 ml 31.28 ml/m  AORTIC VALVE                 PULMONIC VALVE AV Area (Vmax): 3.78 cm     PV Vmax:        1.22 m/s AV Vmax:        116.50 cm/s  PV Peak grad:   6.0 mmHg AV Peak Grad:   5.4 mmHg     RVOT Peak grad: 2 mmHg LVOT Vmax:      106.00 cm/s  AORTA Ao Root diam: 3.70 cm MITRAL VALVE MV Area (PHT): 3.27 cm    SHUNTS MV Decel Time: 232 msec    Systemic Diam: 2.30 cm MV E velocity: 77.60 cm/s MV A velocity: 89.10 cm/s MV E/A ratio:  0.87 Shaukat Khan Electronically signed by Neoma Laming Signature Date/Time: 07/08/2022/12:29:15 PM    Final    EEG adult  Result Date: 07/07/2022 Derek Jack, MD     07/07/2022  6:48 PM Routine EEG Report Joshua Everett is a 88 y.o. male with a history of syncope who is undergoing an EEG to evaluate for seizures.  Report: This EEG was acquired with electrodes placed according to the International 10-20 electrode system (including Fp1, Fp2, F3, F4, C3, C4, P3, P4, O1, O2, T3, T4, T5, T6, A1, A2, Fz, Cz, Pz). The following electrodes were missing or displaced: none. The occipital dominant rhythm was 5-6 Hz. This activity is reactive to stimulation. Drowsiness was manifested by background fragmentation; deeper stages of sleep were identified by K complexes and sleep spindles. There was no focal slowing. There were no interictal epileptiform discharges. There were no electrographic seizures identified. Photic stimulation and hyperventilation were not performed. Impression and clinical correlation: This EEG was obtained while awake and asleep and is abnormal due to moderate diffuse slowing indicative of global cerebral dysfunction. Epileptiform abnormalities were not seen during this recording. [name], MD [facility] [phone] If 7pm- 7am, please page neurology on call as listed in [place].   CT PELVIS WO CONTRAST  Result Date: 07/07/2022 CLINICAL DATA:  Pelvic fracture. EXAM: CT PELVIS WITHOUT CONTRAST TECHNIQUE: Multidetector CT imaging of the pelvis was performed following the standard protocol without intravenous contrast. RADIATION DOSE REDUCTION: This exam was performed according to the departmental dose-optimization program which includes automated exposure control, adjustment of the mA and/or kV according to patient size and/or use of iterative reconstruction technique. COMPARISON:  July 06, 2022. FINDINGS: Urinary Tract: No definite abnormality seen involving the visualized ureters or bladder. Bowel:  Unremarkable visualized pelvic bowel loops. Vascular/Lymphatic: No pathologically enlarged lymph nodes. No significant vascular abnormality seen. Reproductive:  Prostate is unremarkable. Other: Small fat containing left inguinal hernia. No ascites is noted. Musculoskeletal: No fracture is noted.  IMPRESSION: No definite pelvic fracture is noted. Small fat containing left inguinal hernia. Electronically Signed   By: Marijo Conception M.D.   On: 07/07/2022 12:52   US Venous Img Lower Bilateral (DVT)  Result Date: 07/07/2022 CLINICAL DATA:  Pulmonary embolism.  Evaluate for DVT. EXAM: BILATERAL LOWER EXTREMITY VENOUS DOPPLER ULTRASOUND TECHNIQUE: Gray-scale sonography with graded compression, as well as color Doppler and duplex ultrasound were performed to evaluate the lower extremity deep venous systems from the level  of the common femoral vein and including the common femoral, femoral, profunda femoral, popliteal and calf veins including the posterior tibial, peroneal and gastrocnemius veins when visible. The superficial great saphenous vein was also interrogated. Spectral Doppler was utilized to evaluate flow at rest and with distal augmentation maneuvers in the common femoral, femoral and popliteal veins. COMPARISON:  None Available. FINDINGS: RIGHT LOWER EXTREMITY Common Femoral Vein: No evidence of thrombus. Normal compressibility, respiratory phasicity and response to augmentation. Saphenofemoral Junction: No evidence of thrombus. Normal compressibility and flow on color Doppler imaging. Profunda Femoral Vein: No evidence of thrombus. Normal compressibility and flow on color Doppler imaging. Femoral Vein: No evidence of thrombus. Normal compressibility, respiratory phasicity and response to augmentation. Popliteal Vein: No evidence of thrombus. Normal compressibility, respiratory phasicity and response to augmentation. Calf Veins: No evidence of thrombus. Normal compressibility and flow on color Doppler imaging. Superficial Great Saphenous Vein: No evidence of thrombus. Normal compressibility. Other Findings:  None. LEFT LOWER EXTREMITY Common Femoral Vein: No evidence of thrombus. Normal compressibility, respiratory phasicity and response to augmentation. Saphenofemoral Junction: No evidence of  thrombus. Normal compressibility and flow on color Doppler imaging. Profunda Femoral Vein: No evidence of thrombus. Normal compressibility and flow on color Doppler imaging. Femoral Vein: No evidence of thrombus. Normal compressibility, respiratory phasicity and response to augmentation. Popliteal Vein: No evidence of thrombus. Normal compressibility, respiratory phasicity and response to augmentation. Calf Veins: No evidence of thrombus. Normal compressibility and flow on color Doppler imaging. Superficial Great Saphenous Vein: No evidence of thrombus. Normal compressibility. Other Findings:  None. IMPRESSION: No evidence of DVT within either lower extremity. Electronically Signed   By: Sandi Mariscal M.D.   On: 07/07/2022 12:34   CT Angio Chest Pulmonary Embolism (PE) W or WO Contrast  Result Date: 07/07/2022 CLINICAL DATA:  Fall, elevated D-dimer, evaluate for PE EXAM: CT ANGIOGRAPHY CHEST WITH CONTRAST TECHNIQUE: Multidetector CT imaging of the chest was performed using the standard protocol during bolus administration of intravenous contrast. Multiplanar CT image reconstructions and MIPs were obtained to evaluate the vascular anatomy. RADIATION DOSE REDUCTION: This exam was performed according to the departmental dose-optimization program which includes automated exposure control, adjustment of the mA and/or kV according to patient size and/or use of iterative reconstruction technique. CONTRAST:  19m OMNIPAQUE IOHEXOL 350 MG/ML SOLN COMPARISON:  Chest radiograph dated 05/04/2022 FINDINGS: Cardiovascular: Satisfactory opacification of the bilateral pulmonary arteries to the segmental level. Evaluation is constrained due to motion degradation. Within that constraint, there is isolated segmental pulmonary embolism in the right lower lobe pulmonary artery (series 5/image 164). Overall clot burden is very small. Although not tailored for evaluation of the thoracic aorta, there is no evidence thoracic aortic  aneurysm or dissection. Mild atherosclerotic calcifications of the arch. Severe three-coronary atherosclerosis, lad predominant. Mediastinum/Nodes: 1.7 cm short axis right paratracheal node (series 5/image 78). Visualized thyroid is unremarkable. Lungs/Pleura: Evaluation lung parenchyma is constrained by respiratory motion. Within that constraint, there are no suspicious pulmonary nodules. No focal consolidation. No pleural effusion or pneumothorax. Upper Abdomen: Visualized upper abdomen is notable for cholelithiasis, vascular calcifications, and a central hepatic cyst. Musculoskeletal: Degenerative changes of the visualized thoracolumbar spine. Review of the MIP images confirms the above findings. IMPRESSION: Isolated segmental pulmonary embolism in the right lower lobe pulmonary artery. Overall clot burden is very small. 1.7 cm short axis right paratracheal node, nonspecific. Consider follow-up CT chest in 3 months. These results will be called to the ordering clinician or representative by the Radiologist Assistant,  and communication documented in the PACS or [employer]. Aortic Atherosclerosis (ICD10-I70.0). Electronically Signed   By: Julian Hy M.D.   On: 07/07/2022 03:26   MR BRAIN WO CONTRAST  Result Date: 07/06/2022 CLINICAL DATA:  Altered mental status EXAM: MRI HEAD WITHOUT CONTRAST TECHNIQUE: Multiplanar, multiecho pulse sequences of the brain and surrounding structures were obtained without intravenous contrast. COMPARISON:  06/15/2020 FINDINGS: Brain: No acute infarct, mass effect or extra-axial collection. Fewer than 5 scattered microhemorrhages in a nonspecific pattern. There is multifocal hyperintense T2-weighted signal within the white matter. Generalized volume loss. 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 The midline structures are normal. Vascular: Normal flow voids. Skull and upper cervical spine: Normal marrow signal. Sinuses/Orbits: Negative.  Other: None. IMPRESSION: 1. No acute intracranial abnormality. 2. 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 Electronically Signed   By: Ulyses Jarred M.D.   On: 07/06/2022 23:16   DG Hip Unilat W or Wo Pelvis 2-3 Views Right  Result Date: 07/06/2022 CLINICAL DATA:  right leg pain, fall EXAM: DG HIP (WITH OR WITHOUT PELVIS) 2-3V RIGHT COMPARISON:  None Available. FINDINGS: There is no evidence of acute right hip fracture. There is a possible fracture of the right sacral ilia. 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 Degenerative changes of the right SI joint and the pubic symphysis. IMPRESSION: Possible fracture of the right sacral ala. Consider CT of the pelvis. No evidence of acute right hip fracture. Electronically Signed   By: Maurine Simmering M.D.   On: 07/06/2022 18:02   CT Head Wo Contrast  Result Date: 07/06/2022 CLINICAL DATA:  Head trauma, minor. Four falls since yesterday. Neck trauma. EXAM: CT HEAD WITHOUT CONTRAST CT CERVICAL SPINE WITHOUT CONTRAST TECHNIQUE: Multidetector CT imaging of the head and cervical spine was performed following the standard protocol without intravenous contrast. Multiplanar CT image reconstructions of the cervical spine were also generated. RADIATION DOSE REDUCTION: This exam was performed according to the departmental dose-optimization program which includes automated exposure control, adjustment of the mA and/or kV according to patient size and/or use of iterative reconstruction technique. COMPARISON:  CT head and cervical spine 05/03/2022. FINDINGS: CT HEAD FINDINGS Brain: No acute hemorrhage. Unchanged chronic small-vessel disease. 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 Old lacunar infarct in the right cerebellar hemisphere. Cortical gray-white differentiation is otherwise preserved. Prominence of the ventricles and sulci within normal limits for age. No extra-axial collection. Basilar cisterns are patent.  Vascular: No hyperdense vessel or unexpected calcification. Skull: No calvarial fracture or suspicious bone lesion. Skull base is unremarkable. Sinuses/Orbits: Paranasal sinuses, mastoid air cells, and middle ear cavities are well aerated. Orbits are unremarkable. Other: No scalp hematoma. CT CERVICAL SPINE FINDINGS Alignment: 2 mm anterolisthesis of C4 on C5. Degenerative reversal of the normal cervical lordosis. Skull base and vertebrae: No acute fracture. Normal craniocervical junction. No suspicious bone lesions. Soft tissues and spinal canal: No prevertebral fluid or swelling. No visible canal hematoma. Disc levels:  No high-grade spinal canal stenosis. Upper chest: Unremarkable. Other: None. IMPRESSION: 1. No acute intracranial abnormality. 2. No acute cervical spine fracture or traumatic malalignment. Electronically Signed   By: Emmit Alexanders M.D.   On: 07/06/2022 15:25   CT Cervical Spine Wo Contrast  Result Date: 07/06/2022 CLINICAL DATA:  Head trauma, minor. Four falls since yesterday. Neck trauma. EXAM: CT HEAD WITHOUT CONTRAST CT CERVICAL SPINE WITHOUT CONTRAST TECHNIQUE: Multidetector CT imaging of the head and cervical spine was  performed following the standard protocol without intravenous contrast. Multiplanar CT image reconstructions of the cervical spine were also generated. RADIATION DOSE REDUCTION: This exam was performed according to the departmental dose-optimization program which includes automated exposure control, adjustment of the mA and/or kV according to patient size and/or use of iterative reconstruction technique. COMPARISON:  CT head and cervical spine 05/03/2022. FINDINGS: CT HEAD FINDINGS Brain: No acute hemorrhage. Unchanged chronic small-vessel disease. 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 Old lacunar infarct in the right cerebellar hemisphere. Cortical gray-white differentiation is otherwise preserved. Prominence of the ventricles and sulci within normal  limits for age. No extra-axial collection. Basilar cisterns are patent. Vascular: No hyperdense vessel or unexpected calcification. Skull: No calvarial fracture or suspicious bone lesion. Skull base is unremarkable. Sinuses/Orbits: Paranasal sinuses, mastoid air cells, and middle ear cavities are well aerated. Orbits are unremarkable. Other: No scalp hematoma. CT CERVICAL SPINE FINDINGS Alignment: 2 mm anterolisthesis of C4 on C5. Degenerative reversal of the normal cervical lordosis. Skull base and vertebrae: No acute fracture. Normal craniocervical junction. No suspicious bone lesions. Soft tissues and spinal canal: No prevertebral fluid or swelling. No visible canal hematoma. Disc levels:  No high-grade spinal canal stenosis. Upper chest: Unremarkable. Other: None. IMPRESSION: 1. No acute intracranial abnormality. 2. No acute cervical spine fracture or traumatic malalignment. Electronically Signed   By: Emmit Alexanders M.D.   On: 07/06/2022 15:25     Subjective: No acute issues or events overnight, appears to be back to baseline per wife at bedside   Discharge Exam: Vitals:   07/27/22 0736 07/27/22 1143  BP: (!) 195/68 (!) 174/73  Pulse: (!) 55 (!) 56  Resp: 18 18  Temp: 97.6 F (36.4 C) 98.1 F (36.7 C)  SpO2: 97% 100%   Vitals:   07/27/22 0328 07/27/22 0644 07/27/22 0736 07/27/22 1143  BP: (!) 150/116 (!) 175/72 (!) 195/68 (!) 174/73  Pulse: 60 (!) 56 (!) 55 (!) 56  Resp: 16  18 18  $ Temp: 97.6 F (36.4 C)  97.6 F (36.4 C) 98.1 F (36.7 C)  TempSrc: Oral  Oral Oral  SpO2: 96%  97% 100%  Weight:      Height:        General: Pt is alert, awake, not in acute distress Cardiovascular: RRR, S1/S2 +, no rubs, no gallops Respiratory: CTA bilaterally, no wheezing, no rhonchi Abdominal: Soft, NT, ND, bowel sounds + Extremities: no edema, no cyanosis   The results of significant diagnostics from this hospitalization (including imaging, microbiology, ancillary and laboratory) are  listed below for reference.     Microbiology: Recent Results (from the past 240 hour(s))  Blood culture (routine x 2)     Status: None (Preliminary result)   Collection Time: 07/26/22  1:51 PM   Specimen: Right Antecubital; Blood  Result Value Ref Range Status   Specimen Description RIGHT ANTECUBITAL  Final   Special Requests   Final    BOTTLES DRAWN AEROBIC AND ANAEROBIC Blood Culture adequate volume   Culture   Final    NO GROWTH < 24 HOURS Performed at [facility], [address], [place], [place] [address]    Report Status PENDING  Incomplete  Blood culture (routine x 2)     Status: None (Preliminary result)   Collection Time: 07/26/22  2:06 PM   Specimen: BLOOD RIGHT HAND  Result Value Ref Range Status   Specimen Description BLOOD RIGHT HAND  Final   Special Requests   Final  BOTTLES DRAWN AEROBIC AND ANAEROBIC Blood Culture adequate volume   Culture   Final    NO GROWTH < 24 HOURS Performed at [facility], [address] [place], [place] [address]    Report Status PENDING  Incomplete  Resp panel by RT-PCR (RSV, Flu A&B, Covid) Anterior Nasal Swab     Status: None   Collection Time: 07/26/22  2:49 PM   Specimen: Anterior Nasal Swab  Result Value Ref Range Status   SARS Coronavirus 2 by RT PCR NEGATIVE NEGATIVE Final    Comment: (NOTE) SARS-CoV-2 target nucleic acids are NOT DETECTED.  The SARS-CoV-2 RNA is generally detectable in upper respiratory specimens during the acute phase of infection. The lowest concentration of SARS-CoV-2 viral copies this assay can detect is 138 copies/mL. A negative result does not preclude SARS-Cov-2 infection and should not be used as the sole basis for treatment or other patient management decisions. A negative result may occur with  improper specimen collection/handling, submission of specimen other than nasopharyngeal swab, presence of viral mutation(s) within the areas targeted by this assay, and inadequate number of  viral copies(<138 copies/mL). A negative result must be combined with clinical observations, patient history, and epidemiological information. The expected result is Negative.  Fact Sheet for Patients:  EntrepreneurPulse.com.au  Fact Sheet for Healthcare Providers:  IncredibleEmployment.be  This test is no t yet approved or cleared by the [place] FDA and  has been authorized for detection and/or diagnosis of SARS-CoV-2 by FDA under an Emergency Use Authorization (EUA). This EUA will remain  in effect (meaning this test can be used) for the duration of the COVID-19 declaration under Section 564(b)(1) of the Act, 21 U.S.C.section 360bbb-3(b)(1), unless the authorization is terminated  or revoked sooner.       Influenza A by PCR NEGATIVE NEGATIVE Final   Influenza B by PCR NEGATIVE NEGATIVE Final    Comment: (NOTE) The Xpert Xpress SARS-CoV-2/FLU/RSV plus assay is intended as an aid in the diagnosis of influenza from Nasopharyngeal swab specimens and should not be used as a sole basis for treatment. Nasal washings and aspirates are unacceptable for Xpert Xpress SARS-CoV-2/FLU/RSV testing.  Fact Sheet for Patients: EntrepreneurPulse.com.au  Fact Sheet for Healthcare Providers: IncredibleEmployment.be  This test is not yet approved or cleared by the [place] FDA and has been authorized for detection and/or diagnosis of SARS-CoV-2 by FDA under an Emergency Use Authorization (EUA). This EUA will remain in effect (meaning this test can be used) for the duration of the COVID-19 declaration under Section 564(b)(1) of the Act, 21 U.S.C. section 360bbb-3(b)(1), unless the authorization is terminated or revoked.     Resp Syncytial Virus by PCR NEGATIVE NEGATIVE Final    Comment: (NOTE) Fact Sheet for Patients: EntrepreneurPulse.com.au  Fact Sheet for Healthcare  Providers: IncredibleEmployment.be  This test is not yet approved or cleared by the [place] FDA and has been authorized for detection and/or diagnosis of SARS-CoV-2 by FDA under an Emergency Use Authorization (EUA). This EUA will remain in effect (meaning this test can be used) for the duration of the COVID-19 declaration under Section 564(b)(1) of the Act, 21 U.S.C. section 360bbb-3(b)(1), unless the authorization is terminated or revoked.  Performed at [facility], [address], [place], [place] [address]      Labs: BNP (last 3 results) No results for input(s): "BNP" in the last 8760 hours. Basic Metabolic Panel: Recent Labs  Lab 07/26/22 1351  NA 137  K 3.8  CL 104  CO2  27  GLUCOSE 109*  BUN 34*  CREATININE 1.20  CALCIUM 8.7*   Liver Function Tests: Recent Labs  Lab 07/26/22 1351  AST 58*  ALT 7  ALKPHOS 43  BILITOT 0.7  PROT 5.2*  ALBUMIN 2.9*   No results for input(s): "LIPASE", "AMYLASE" in the last 168 hours. No results for input(s): "AMMONIA" in the last 168 hours. CBC: Recent Labs  Lab 07/26/22 1351  WBC 6.1  NEUTROABS 4.1  HGB 12.0*  HCT 37.4*  MCV 94.2  PLT 161   Cardiac Enzymes: No results for input(s): "CKTOTAL", "CKMB", "CKMBINDEX", "TROPONINI" in the last 168 hours. BNP: Invalid input(s): "POCBNP" CBG: Recent Labs  Lab 07/26/22 1240 07/27/22 1142  GLUCAP 124* 129*   D-Dimer No results for input(s): "DDIMER" in the last 72 hours. Hgb A1c Recent Labs    07/26/22 2155  HGBA1C 5.0   Lipid Profile Recent Labs    07/27/22 0509  CHOL 110  HDL 23*  LDLCALC 67  TRIG 98  CHOLHDL 4.8   Thyroid function studies Recent Labs    07/26/22 1351  TSH 2.911   Anemia work up No results for input(s): "VITAMINB12", "FOLATE", "FERRITIN", "TIBC", "IRON", "RETICCTPCT" in the last 72 hours. Urinalysis    Component Value Date/Time   COLORURINE YELLOW 07/26/2022 1400   APPEARANCEUR CLEAR 07/26/2022 1400    LABSPEC 1.019 07/26/2022 1400   PHURINE 5.0 07/26/2022 1400   GLUCOSEU NEGATIVE 07/26/2022 1400   HGBUR NEGATIVE 07/26/2022 1400   BILIRUBINUR NEGATIVE 07/26/2022 1400   BILIRUBINUR negative 01/06/2020 1000   KETONESUR 5 (A) 07/26/2022 1400   PROTEINUR NEGATIVE 07/26/2022 1400   UROBILINOGEN 1.0 01/06/2020 1000   NITRITE NEGATIVE 07/26/2022 1400   LEUKOCYTESUR NEGATIVE 07/26/2022 1400   Sepsis Labs Recent Labs  Lab 07/26/22 1351  WBC 6.1   Microbiology Recent Results (from the past 240 hour(s))  Blood culture (routine x 2)     Status: None (Preliminary result)   Collection Time: 07/26/22  1:51 PM   Specimen: Right Antecubital; Blood  Result Value Ref Range Status   Specimen Description RIGHT ANTECUBITAL  Final   Special Requests   Final    BOTTLES DRAWN AEROBIC AND ANAEROBIC Blood Culture adequate volume   Culture   Final    NO GROWTH < 24 HOURS Performed at [facility], [address] [place], [place] [address]    Report Status PENDING  Incomplete  Blood culture (routine x 2)     Status: None (Preliminary result)   Collection Time: 07/26/22  2:06 PM   Specimen: BLOOD RIGHT HAND  Result Value Ref Range Status   Specimen Description BLOOD RIGHT HAND  Final   Special Requests   Final    BOTTLES DRAWN AEROBIC AND ANAEROBIC Blood Culture adequate volume   Culture   Final    NO GROWTH < 24 HOURS Performed at [facility], [address], [place], [place] [address]    Report Status PENDING  Incomplete  Resp panel by RT-PCR (RSV, Flu A&B, Covid) Anterior Nasal Swab     Status: None   Collection Time: 07/26/22  2:49 PM   Specimen: Anterior Nasal Swab  Result Value Ref Range Status   SARS Coronavirus 2 by RT PCR NEGATIVE NEGATIVE Final    Comment: (NOTE) SARS-CoV-2 target nucleic acids are NOT DETECTED.  The SARS-CoV-2 RNA is generally detectable in upper respiratory specimens during the acute phase of infection. The lowest concentration of SARS-CoV-2 viral copies this  assay can detect is  138 copies/mL. A negative result does not preclude SARS-Cov-2 infection and should not be used as the sole basis for treatment or other patient management decisions. A negative result may occur with  improper specimen collection/handling, submission of specimen other than nasopharyngeal swab, presence of viral mutation(s) within the areas targeted by this assay, and inadequate number of viral copies(<138 copies/mL). A negative result must be combined with clinical observations, patient history, and epidemiological information. The expected result is Negative.  Fact Sheet for Patients:  EntrepreneurPulse.com.au  Fact Sheet for Healthcare Providers:  IncredibleEmployment.be  This test is no t yet approved or cleared by the [place] FDA and  has been authorized for detection and/or diagnosis of SARS-CoV-2 by FDA under an Emergency Use Authorization (EUA). This EUA will remain  in effect (meaning this test can be used) for the duration of the COVID-19 declaration under Section 564(b)(1) of the Act, 21 U.S.C.section 360bbb-3(b)(1), unless the authorization is terminated  or revoked sooner.       Influenza A by PCR NEGATIVE NEGATIVE Final   Influenza B by PCR NEGATIVE NEGATIVE Final    Comment: (NOTE) The Xpert Xpress SARS-CoV-2/FLU/RSV plus assay is intended as an aid in the diagnosis of influenza from Nasopharyngeal swab specimens and should not be used as a sole basis for treatment. Nasal washings and aspirates are unacceptable for Xpert Xpress SARS-CoV-2/FLU/RSV testing.  Fact Sheet for Patients: EntrepreneurPulse.com.au  Fact Sheet for Healthcare Providers: IncredibleEmployment.be  This test is not yet approved or cleared by the [place] FDA and has been authorized for detection and/or diagnosis of SARS-CoV-2 by FDA under an Emergency Use Authorization (EUA). This EUA will  remain in effect (meaning this test can be used) for the duration of the COVID-19 declaration under Section 564(b)(1) of the Act, 21 U.S.C. section 360bbb-3(b)(1), unless the authorization is terminated or revoked.     Resp Syncytial Virus by PCR NEGATIVE NEGATIVE Final    Comment: (NOTE) Fact Sheet for Patients: EntrepreneurPulse.com.au  Fact Sheet for Healthcare Providers: IncredibleEmployment.be  This test is not yet approved or cleared by the [place] FDA and has been authorized for detection and/or diagnosis of SARS-CoV-2 by FDA under an Emergency Use Authorization (EUA). This EUA will remain in effect (meaning this test can be used) for the duration of the COVID-19 declaration under Section 564(b)(1) of the Act, 21 U.S.C. section 360bbb-3(b)(1), unless the authorization is terminated or revoked.  Performed at [facility], [address] [place], [place] [address]      Time coordinating discharge: Over 30 minutes  SIGNED:   Little Ishikawa, DO [facility] 07/27/2022, 12:54 PM Pager   If 7PM-7AM, please contact night-coverage [email or web address]

## 2022-07-27 NOTE — Plan of Care (Signed)
EEG with findings suggestive of PLEDs, left hemisphere predominant, but also with periodic discharges intermittently on the right as well. There may also be a PLEDs+ pattern. Will need to be assessed by Epilepsy attending. Most likely will need LTM EEG. Discussed in sign out with Dr. Curly Shores.   Electronically signed: Dr. Kerney Elbe

## 2022-07-27 NOTE — Evaluation (Signed)
Physical Therapy Evaluation Patient Details Name: Joshua Everett MRN: [id number] DOB: 1934/09/19 Today's Date: 07/27/2022  History of Present Illness  Pt is an 88 y.o. male who presented 07/26/22 from his SNF with obtundation and facial drooping. 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 EEG with findings suggestive of PLEDs, left hemisphere predominant, but also with periodic discharges intermittently on the right as well. PMH: Parkinson's dementia, glaucoma, GERD, HLD, prediabetes, veteran with agent orange exposure in [place] and [place], urinary incontinence   Clinical Impression  Pt presents with condition above and deficits mentioned below, see PT Problem List. PTA, he had recently been at a SNF working with therapy on ambulating up to ~200 steps with a RW, chair follow, and assistance, per wife. Currently, pt demonstrates deficits in gross overall strength (appears fairly symmetrical), bil lower extremity coordination, balance, activity tolerance, power, and, reportedly, L lower extremity sensation. Pt has baseline cognitive deficits cue to his dementia. Currently, he is requiring modAx2 for bed mobility and mod-maxAx2 to transfer to stand with bil HHA. He does follow simple commands with increased time and could likely do well transferring with +2 assistance using a stedy. He was unable to take steps today. Recommending further rehab at a SNF upon d/c. Will continue to follow acutely.     Recommendations for follow up therapy are one component of a multi-disciplinary discharge planning process, led by the attending physician.  Recommendations may be updated based on patient status, additional functional criteria and insurance authorization.  Follow Up Recommendations Skilled nursing-short term rehab (<3 hours/day) Can patient physically be transported by private vehicle: No    Assistance Recommended at Discharge Frequent or constant Supervision/Assistance  Patient  can return home with the following  Assistance with feeding;Assist for transportation;Direct supervision/assist for financial management;Direct supervision/assist for medications management;Assistance with cooking/housework;A lot of help with bathing/dressing/bathroom;Help with stairs or ramp for entrance;Two people to help with walking and/or transfers    Equipment Recommendations Other (comment) (defer to next venue of care)  Recommendations for Other Services       Functional Status Assessment Patient has had a recent decline in their functional status and demonstrates the ability to make significant improvements in function in a reasonable and predictable amount of time.     Precautions / Restrictions Precautions Precautions: Fall Precaution Comments: bil mitts Restrictions Weight Bearing Restrictions: No      Mobility  Bed Mobility Overal bed mobility: Needs Assistance Bed Mobility: Supine to Sit, Sit to Supine     Supine to sit: +2 for physical assistance, +2 for safety/equipment, Mod assist, HOB elevated Sit to supine: Mod assist, +2 for physical assistance, +2 for safety/equipment, HOB elevated   General bed mobility comments: increased time to initiate, assist for fully bringing LEs towards EOB and elevating trunk to sit up L EOB. Pt initiating more with return to supine    Transfers Overall transfer level: Needs assistance Equipment used: 2 person hand held assist Transfers: Sit to/from Stand Sit to Stand: Max assist, Mod assist, +2 physical assistance, +2 safety/equipment           General transfer comment: initial stand with max assist +2, fades to mod assist +2 on 2nd stand    Ambulation/Gait               General Gait Details: tried to take a side step, but needing maxAx2 for balance, shifting weight, and sliding foot to move standing at EOB  Stairs  Wheelchair Mobility    Modified Rankin (Stroke Patients Only) Modified Rankin  (Stroke Patients Only) Pre-Morbid Rankin Score: Moderately severe disability Modified Rankin: Severe disability     Balance Overall balance assessment: History of Falls, Needs assistance Sitting-balance support: No upper extremity supported, Feet supported Sitting balance-Leahy Scale: Good Sitting balance - Comments: statically min guard to close supervision   Standing balance support: Bilateral upper extremity supported, During functional activity Standing balance-Leahy Scale: Poor Standing balance comment: relies on BUE and external support                             Pertinent Vitals/Pain Pain Assessment Pain Assessment: Faces Faces Pain Scale: No hurt Pain Intervention(s): Monitored during session    Home Living Family/patient expects to be discharged to:: Skilled nursing facility                        Prior Function Prior Level of Function : Needs assist;History of Falls (last six months)             Mobility Comments: has been at rehab, in a wheelchair mostly. assist for transfers and limited mobility (up to ~200 steps per wife) using RW with assist ADLs Comments: assists with ADLs from spouse and aide at facility. pt was still self feeding     Hand Dominance   Dominant Hand: Right    Extremity/Trunk Assessment   Upper Extremity Assessment Upper Extremity Assessment: Defer to OT evaluation    Lower Extremity Assessment Lower Extremity Assessment: LLE deficits/detail;RLE deficits/detail RLE Deficits / Details: MMT scores of 4 hip flexion, 4+ knee extension; denies numbness/tingling; gross incoordination RLE Coordination: decreased fine motor;decreased gross motor LLE Deficits / Details: MMT scores of 4 hip flexion, 4+ knee extension; reports tingling in leg; gross incoordination LLE Sensation: decreased light touch LLE Coordination: decreased fine motor;decreased gross motor    Cervical / Trunk Assessment Cervical / Trunk Assessment:  Kyphotic  Communication   Communication: No difficulties  Cognition Arousal/Alertness: Awake/alert Behavior During Therapy: Flat affect Overall Cognitive Status: History of cognitive impairments - at baseline                                 General Comments: pt with baseline dementia.  He is able to follow simple 1 step commands given increased time.        General Comments General comments (skin integrity, edema, etc.): spouse present and supportive, possible plans to move pt to [place] with more family support    Exercises     Assessment/Plan    PT Assessment Patient needs continued PT services  PT Problem List Decreased activity tolerance;Decreased balance;Decreased mobility;Decreased safety awareness;Decreased cognition;Decreased strength;Decreased coordination;Impaired sensation       PT Treatment Interventions DME instruction;Gait training;Functional mobility training;Therapeutic activities;Therapeutic exercise;Neuromuscular re-education;Balance training;Cognitive remediation;Patient/family education;Wheelchair mobility training    PT Goals (Current goals can be found in the Care Plan section)  Acute Rehab PT Goals Patient Stated Goal: none stated. Spouse present and hopeful to move pt to memory care facility in [place] PT Goal Formulation: With patient/family Time For Goal Achievement: 08/10/22 Potential to Achieve Goals: Fair    Frequency Min 3X/week     Co-evaluation PT/OT/SLP Co-Evaluation/Treatment: Yes Reason for Co-Treatment: For patient/therapist safety;To address functional/ADL transfers;Necessary to address cognition/behavior during functional activity PT goals addressed during session: Mobility/safety with mobility;Balance OT goals addressed  during session: ADL's and self-care       AM-PAC PT "6 Clicks" Mobility  Outcome Measure Help needed turning from your back to your side while in a flat bed without using bedrails?: A Lot Help needed moving  from lying on your back to sitting on the side of a flat bed without using bedrails?: Total Help needed moving to and from a bed to a chair (including a wheelchair)?: Total Help needed standing up from a chair using your arms (e.g., wheelchair or bedside chair)?: Total Help needed to walk in hospital room?: Total Help needed climbing 3-5 steps with a railing? : Total 6 Click Score: 7    End of Session Equipment Utilized During Treatment: Gait belt Activity Tolerance: Patient tolerated treatment well;Patient limited by fatigue Patient left: in bed;with call bell/phone within reach;with bed alarm set;with family/visitor present;with restraints reapplied Nurse Communication: Mobility status;Need for lift equipment PT Visit Diagnosis: Unsteadiness on feet (R26.81);Difficulty in walking, not elsewhere classified (R26.2);Other abnormalities of gait and mobility (R26.89);Muscle weakness (generalized) (M62.81);Repeated falls (R29.6);History of falling (Z91.81)    Time: [id number] PT Time Calculation (min) (ACUTE ONLY): 24 min   Charges:   PT Evaluation $PT Eval Moderate Complexity: 1 Mod          Moishe Spice, PT, DPT Acute Rehabilitation Services  Office: [phone]   Orvan Falconer 07/27/2022, 12:35 PM

## 2022-07-27 NOTE — Consult Note (Signed)
NEURO HOSPITALIST CONSULT NOTE   Requestig physician: Dr. Denton Brick  Reason for Consult: Altered mental status  History obtained from:  Chart     HPI:                                                                                                                                          Joshua Everett is an 88 y.o. male veteran of the [place] with agent orange exposure in [place] and [place], 100% service-connected disability with the [place], with a PMHx of Parkinson's disease, dementia with behavioral disturbance, HLD, multiple locations of squamous cell carcinoma of the skin, basal cell carcinoma, history of urinary incontinence, who was seen by Neurology on Wednesday afternoon as a Telestroke consult after he presented to the [facility] ED from his SNF with obtundation and facial drooping. Teleneurology exam was nonfocal but confounded somewhat by the patient's comorbidities of Parkinson's disease with dementia and behavioral disturbance, as well as the limitations of remote exam via camera. MRI revealed punctate DWI signal abnormality in the basal ganglia x 2, felt most likely to be either artifact or acute lacunar infarctions, but the findings were not sufficient to explain the patient's altered mentation. MRI also revealed a punctate right cerebellar DWI abnormality suspicious for possible acute infarction as well.   He does have a history of seizures documented with no clear workup in the past and only on gabapentin 100 mg. No seizure witnessed during Teleneurology consult and none reported by staff.   He has been transferred to [facility] for LTM EEG and repeat Neurology assessment.    He was recently discharged from [facility] to SNF on 2/1 after a 2-day admission for evaluation of multiple falls that had occurred at home. He was found to have small nonocclusive PE on CTA of the chest, with bilateral lower extremity Dopplers negative. He had been placed on IV  heparin drip. After discussion with his wife given his high risk of recurrent falls, it was decided not to continue anticoagulation. His hospital course at [facility] was complicated by delirium for which he required PRN Haldol and nightly Seroquel. Eventually, delirium improved. He was discharged on p.o. Seroquel 50 mg qhs. EEG while at [facility] revealed moderate diffuse slowing indicative of global cerebral dysfunction; epileptiform abnormalities were not seen during the recording.   HPI from Dr. Johny Chess Teleneurology note on 2/21 has been reviewed: "...presenting from the facility when he was noted by his wife to be drooling from his mouth and not being responsive this morning.  She saw him at 10:30 AM with the symptoms but the last known well is unclear.  She says that the patient's roommate reported that he had a rough night, he was very agitated and the staff was also  pretty rough with him.  I was unable to obtain a time when he was last known well.  She says that he has times where he is pretty lucid but other times he hallucinates and sees his mother and sister quite a lot.  She sees him every day since he has been in rehab and last saw him yesterday at some point. He was brought in by EMS from [facility] will, with blood pressures in the 70s with 2 boluses given and blood pressure up to [id number] systolic.  Evaluated by the ED provider, responsive only to pain and code stroke was activated. He follows with [facility] neurolog[facility]-Dr. Manuella Ghazi.  He is also being worked up for seizure-like activity where he has had a loss of consciousness, vocalization of loud groan, drooling and sweating and increasing confusion after the event in September 2022 and another episode in April 2023 and the wife says has happened 1 more time for a total of 3 events.  He was initially on lamotrigine which was discontinued and these episodes happened after that.  He has since been kept on gabapentin 100 mg every night because he is not tolerating any  higher doses of that. Today, there was no witnessed seizure activity by wife."  Past Medical History:  Diagnosis Date   Actinic keratosis    Basal cell carcinoma 09/03/2014   right of midline forehead   Basal cell carcinoma 03/25/2019   right lower nasal dorsum, [facility]   Basal cell carcinoma 03/30/2020   Mid nasal tip, [facility]   Basal cell carcinoma (BCC) 01/28/2015   right proximal nasal alar rim   BPH without obstruction/lower urinary tract symptoms 09/08/2014   Chronic constipation 09/08/2014   GERD (gastroesophageal reflux disease)    Glaucoma    History of chicken pox    Hyperlipidemia    Obesity, Class I, BMI 30-34.9 09/08/2014   Prediabetes 07/31/2015   Rosacea 07/06/2015   Squamous cell carcinoma of skin 01/30/2007   mid back, in situ   Squamous cell carcinoma of skin 03/12/2018   right forearm, in situ   Squamous cell carcinoma of skin 04/22/2018   left pretibial   Squamous cell carcinoma of skin 09/24/2018   right hand dorsum, in situ   Urine incontinence     Past Surgical History:  Procedure Laterality Date   COLONOSCOPY  03/2009   1 TA, diverticulosis, rpt 5 yrs (Dr Ac[facility]an[facility] in [facility])   ESOPHAGOGASTRODUODENOSCOPY  03/2009   small HH, gastric polyps biopsied, dilation for dysphagia ([facility])   [place]    Family History  Problem Relation Age of Onset   Diabetes Mother    Alzheimer's disease Mother 51   Kidney disease Father    Cancer Father        brain tumor   Cancer Sister        breast             Social History:  reports that he quit smoking about 54 years ago. His smoking use included cigarettes. He has never used smokeless tobacco. He reports that he does not currently use alcohol. He reports that he does not use drugs.  Allergies  Allergen Reactions   Solifenacin Other (See Comments)    Hallucinations (per uro)    MEDICATIONS:  Prior to Admission:  Medications Prior to Admission  Medication Sig Dispense Refill Last Dose   aspirin EC 81 MG tablet Take 81 mg by mouth daily. Swallow whole.      bimatoprost (LUMIGAN) 0.01 % SOLN Place 1 drop into both eyes at bedtime.       carbidopa-levodopa (SINEMET IR) 25-100 MG tablet Take 2 tablets by mouth 4 (four) times daily.      cholecalciferol (VITAMIN D) 1000 units tablet Take 1,000 Units by mouth daily.       Cyanocobalamin (VITAMIN B 12 PO) Take by mouth daily.      gabapentin (NEURONTIN) 100 MG capsule Take 100 mg by mouth at bedtime.      hydrochlorothiazide (HYDRODIURIL) 12.5 MG tablet Take 0.5 tablets (6.25 mg total) by mouth daily. 15 tablet 0    lisinopril (ZESTRIL) 10 MG tablet Take 1 tablet (10 mg total) by mouth 2 (two) times daily. 60 tablet 0    Multiple Vitamins-Minerals (MULTIVITAMIN ADULT PO) Take 1 tablet by mouth daily. (Patient not taking: Reported on 07/06/2022)      polyethylene glycol powder (GLYCOLAX/MIRALAX) 17 GM/SCOOP powder Take 17 g by mouth daily as needed for moderate constipation.      QUEtiapine (SEROQUEL) 50 MG tablet Take 1 tablet (50 mg total) by mouth at bedtime.      sennosides-docusate sodium (SENOKOT-S) 8.6-50 MG tablet Take 1 tablet by mouth daily.      timolol (TIMOPTIC) 0.5 % ophthalmic solution Place 1 drop into both eyes daily.      Scheduled:   stroke: early stages of recovery book   Does not apply Once   enoxaparin (LOVENOX) injection  40 mg Subcutaneous Q24H   Continuous:  dextrose 5 % and 0.9 % NaCl with KCl 20 mEq/L 75 mL/hr at 07/27/22 0012     ROS:                                                                                                                                       Unable to obtain a detailed ROS in the context of AMS. The patient states his neck has been stiff for "4 months" and that the joints in his RLE hurt.    Blood pressure (!) 193/68, pulse (!) 58, temperature 98 F (36.7  C), temperature source Oral, resp. rate 16, height 5' 10"$  (1.778 m), weight 95 kg, SpO2 93 %.   General Examination:  Physical Exam  HEENT-  [place]/AT. No meningismus.     Lungs- Respirations unlabored Extremities- No edema   Neurological Examination Mental Status: Drowsy with decreased level of alertness. Abulic with hypophonic speech that is sparse but fluent. Able to follow all basic commands. Somewhat oppositional and with dysthymic affect. Mild dysarthria in the setting of drowsiness. Oriented to the state with some difficulty, but thinks he is in [place]. Does not know the day of the week, the month or the year. Knows that he is in a hospital.  Cranial Nerves: II: Gross blink to threat intact in both eyes. Otherwise not compliant. Pupils 2 mm >> pinpoint with penlight.   III,IV, VI: Will track to the left and right with conjugate EOM. No nystagmus.  V: Intact to FT bilaterally VII: Grimaces symmetrically  VIII: Hearing intact to voice IX,X: Hypophonic speech XI: Head is midline XII: Midline tongue extension Motor: Right upper extremity   4+/5     Left upper extremity   4+/5 Right lower extremity   Poor effort in context of pain, max strength elicitable is [id number]    Left lower extremity  4+/5 All limbs are bradykinetic with increased tone (most consistent with mild rigidity). No cogwheeling noted.  No rest tremor noted.  No asterixis.  No action tremor.  No jerking, twitching or other seizure-like activity noted.  Sensory: Intact to FT x 4 without asymmetry subjectively Deep Tendon Reflexes: 2+ and symmetric patellars. Refuses further testing of reflexes.  Cerebellar: Does not comply with testing.  Gait: Deferred due to falls risk concerns and poor compliance with exam   Lab Results: Basic Metabolic Panel: Recent Labs  Lab 07/26/22 1351  NA 137  K 3.8  CL 104  CO2 27   GLUCOSE 109*  BUN 34*  CREATININE 1.20  CALCIUM 8.7*    CBC: Recent Labs  Lab 07/26/22 1351  WBC 6.1  NEUTROABS 4.1  HGB 12.0*  HCT 37.4*  MCV 94.2  PLT 161    Cardiac Enzymes: No results for input(s): "CKTOTAL", "CKMB", "CKMBINDEX", "TROPONINI" in the last 168 hours.  Lipid Panel: No results for input(s): "CHOL", "TRIG", "HDL", "CHOLHDL", "VLDL", "LDLCALC" in the last 168 hours.  Imaging: DG Humerus Left  Result Date: 07/26/2022 CLINICAL DATA:  Left arm bruising, initial encounter EXAM: LEFT HUMERUS - 2+ VIEW COMPARISON:  None Available. FINDINGS: Mild degenerative changes of the acromioclavicular joint are seen. No acute fracture or dislocation in the humerus is seen. No soft tissue abnormality is noted. IMPRESSION: No acute abnormality noted. Electronically Signed   By: Inez Catalina M.D.   On: 07/26/2022 19:12   DG Chest Port 1 View  Result Date: 07/26/2022 CLINICAL DATA:  Mental status change. EXAM: PORTABLE CHEST 1 VIEW COMPARISON:  05/04/2022 FINDINGS: Poor inspiration. Heart size upper limits of normal. Chronic aortic atherosclerosis. Poor aeration of the lung bases, presumably subsequent to the poor inspiration. Cannot rule out mild basilar infiltrate/atelectasis, but the findings may just be subsequent to the poor inspiration. IMPRESSION: Poor aeration of the lung bases, presumably subsequent to the poor inspiration. Cannot rule out mild basilar infiltrate/atelectasis. Electronically Signed   By: Nelson Chimes M.D.   On: 07/26/2022 15:40   MR BRAIN WO CONTRAST  Result Date: 07/26/2022 CLINICAL DATA:  Delirium EXAM: MRI HEAD WITHOUT CONTRAST TECHNIQUE: Multiplanar, multiecho pulse sequences of the brain and surrounding structures were obtained without intravenous contrast. COMPARISON:  MRI head February 2024. FINDINGS: Moderate to severely motion limited study.  Within this limitation: Brain:  Significantly motion limited study with somewhat linear appearing DWI  hyperintensity and FLAIR hyperintensity in the globus palladi bilaterally. Additional more convincing acute infarct in the right cerebellum with punctate focus of restricted diffusion on series 9, image 10. No evidence of acute hemorrhage, mass lesion, midline shift or hydrocephalus. Cerebral atrophy. Remote right cerebellar infarct. Vascular: Major arterial flow voids are maintained at the skull base. Skull and upper cervical spine: Normal marrow signal. Motion limited. Sinuses/Orbits: Negative. Other: No mastoid effusions. IMPRESSION: 1. Moderate to severely motion limited study with somewhat linear appearing DWI hyperintensity and possible FLAIR hyperintensity in the globus palladi bilaterally. While this could be artifactual, acute hypoxic/ischemic or toxic/metabolic (including carbon monoxide) insult is not excluded. If the patient does not improve clinically, a follow-up MRI could assess for change. 2. Punctate acute infarct in the right cerebellum. No significant MR mass effect. Electronically Signed   By: Margaretha Sheffield M.D.   On: 07/26/2022 15:03   CT ANGIO HEAD NECK W WO CM W PERF (CODE STROKE)  Result Date: 07/26/2022 CLINICAL DATA:  Neuro deficit, acute, stroke suspected EXAM: CT ANGIOGRAPHY HEAD AND NECK CT PERFUSION BRAIN TECHNIQUE: Multidetector CT imaging of the head and neck was performed using the standard protocol during bolus administration of intravenous contrast. Multiplanar CT image reconstructions and MIPs were obtained to evaluate the vascular anatomy. Carotid stenosis measurements (when applicable) are obtained utilizing NASCET criteria, using the distal internal carotid diameter as the denominator. Multiphase CT imaging of the brain was performed following IV bolus contrast injection. Subsequent parametric perfusion maps were calculated using RAPID software. RADIATION DOSE REDUCTION: This exam was performed according to the departmental dose-optimization program which includes  automated exposure control, adjustment of the mA and/or kV according to patient size and/or use of iterative reconstruction technique. CONTRAST:  158m OMNIPAQUE IOHEXOL 350 MG/ML SOLN COMPARISON:  Head CT immediately prior.  MRI 07/06/2022 FINDINGS: CTA NECK FINDINGS Aortic arch: Mild aortic atherosclerotic calcification. Branching pattern is normal without origin stenosis. Right carotid system: Common carotid artery widely patent to the bifurcation. Minimal plaque at the bifurcation but no stenosis. Cervical ICA widely patent. Left carotid system: Common carotid artery widely patent to the bifurcation. Bifurcation is normal. Cervical ICA is normal. Vertebral arteries: No proximal subclavian stenosis. Both vertebral artery origins are widely patent. Both vertebral arteries are normal through the cervical region to the foramen magnum. Skeleton: Ordinary chronic cervical spondylosis. Other neck: No mass or lymphadenopathy. Upper chest: Lung apices are clear. Review of the MIP images confirms the above findings CTA HEAD FINDINGS Anterior circulation: Both internal carotid arteries are patent through the skull base and siphon regions. There is ordinary siphon atherosclerotic calcification but no stenosis. The anterior and middle cerebral arteries are patent. No large vessel occlusion or proximal stenosis. No aneurysm or vascular malformation. Distal branch vessels do show some atherosclerotic irregularity. Posterior circulation: Both vertebral arteries are patent through the foramen magnum to the basilar artery. There is calcified plaque in both vertebral artery V4 segments. No stenosis on the right. 30-50% stenosis on the left. No basilar artery stenosis. Superior cerebellar and posterior cerebral arteries are patent. Venous sinuses: Patent and normal. Anatomic variants: Azygous anterior cerebral artery. Review of the MIP images confirms the above findings CT Brain Perfusion Findings: ASPECTS: 10 allowing for old  strokes. CBF (<30%) Volume: 055mPerfusion (Tmax>6.0s) volume: 23m51mismatch Volume: 23mL82mfarction Location:None IMPRESSION: 1. Negative perfusion study. No evidence of core infarction or penumbra. 2. No intracranial large vessel occlusion or flow  limiting proximal stenosis. Distal vessel atherosclerotic irregularity. 3. Mild/minimal atherosclerotic change of the aorta and carotid bifurcations but without stenosis. 4. 30-50% stenosis of the left vertebral artery V4 segment. Aortic Atherosclerosis (ICD10-I70.0). Electronically Signed   By: Nelson Chimes M.D.   On: 07/26/2022 13:21   CT HEAD CODE STROKE WO CONTRAST  Result Date: 07/26/2022 CLINICAL DATA:  Code stroke. Neuro deficit, acute, stroke suspected. Altered mental status. EXAM: CT HEAD WITHOUT CONTRAST TECHNIQUE: Contiguous axial images were obtained from the base of the skull through the vertex without intravenous contrast. RADIATION DOSE REDUCTION: This exam was performed according to the departmental dose-optimization program which includes automated exposure control, adjustment of the mA and/or kV according to patient size and/or use of iterative reconstruction technique. COMPARISON:  Head CT and MRI 07/06/2022 FINDINGS: Brain: There is no evidence of an acute infarct, intracranial hemorrhage, mass, midline shift, or extra-axial fluid collection. Chronic right frontal and cerebellar infarcts are again noted. Patchy hypodensities in the cerebral white matter bilaterally are unchanged and nonspecific but compatible with moderate chronic small vessel ischemic disease. There is moderate cerebral atrophy. Vascular: Calcified atherosclerosis at the skull base. No hyperdense vessel. Skull: No fracture or suspicious osseous lesion. Sinuses/Orbits: Paranasal sinuses and mastoid air cells are clear. Unremarkable orbits. Other: None. These results were called by telephone at the time of interpretation on 07/26/2022 at 12:59 p.m. to Dr. Milton Ferguson, who verbally  acknowledged these results. IMPRESSION: 1. No evidence of acute intracranial abnormality. 2. Moderate chronic small vessel ischemic disease and cerebral atrophy. 3. Chronic right frontal and cerebellar infarcts. Electronically Signed   By: Logan Bores M.D.   On: 07/26/2022 13:00     Assessment: 88 year old male re-presenting to the hospital with AMS - Exam reveals poor orientation, abulia, dysthymic affect and poor cooperation. Limbs with mild rigidity and bradykinesia. Overall findings most consistent with diffuse hypofunction of the cerebral hemispheres as well as features that localize to the bilateral basal ganglia in the context of his known Parkinson's disease - MRI brain: Moderate to severely motion limited study with somewhat linear appearing DWI hyperintensity and possible FLAIR hyperintensity in the globus palladi bilaterally. While this could be artifactual, acute hypoxic/ischemic or toxic/metabolic (including carbon monoxide) insult is not excluded. Punctate acute infarct in the right cerebellum. - CTA of head and neck: No evidence of core infarction or penumbra on CTP. No intracranial large vessel occlusion or flow limiting proximal stenosis. Distal vessel atherosclerotic irregularity. Mild/minimal atherosclerotic change of the aorta and carotid bifurcations but without stenosis. 30-50% stenosis of the left vertebral artery V4 segment. Aortic Atherosclerosis. - DDx:  - Relatively high on the differential diagnosis are acute cognitive fluctuations in the setting of Parkinson's disease. Suspicion for this is increased by his hallucinations as well as prior spells of altered level of consciousness that resolved spontaneously.  - Toxic/metabolic/infectious encephalopathy is also a consideration. No fever, white count or meningismus to suggest meningitis.  - Elevated BUN/Cr ratio suggestive of possible volume depletion, which can present with AMS in elderly or neurologically disabled  patients  Recommendations: - Can continue to use Seroquel, but would avoid all other neuroleptics, especially Haldol, as these can result in significant cognitive and motor worsening in Parkinson's disease patients - Avoid deliriogenic medications - IV hydration - PT/OT/Speech consults - EEG complete with results pending.    Electronically signed: Dr. Kerney Elbe 07/27/2022, 12:35 AM

## 2022-07-28 DIAGNOSIS — G9340 Encephalopathy, unspecified: Secondary | ICD-10-CM | POA: Diagnosis not present

## 2022-07-28 DIAGNOSIS — G0430 Acute necrotizing hemorrhagic encephalopathy, unspecified: Secondary | ICD-10-CM | POA: Diagnosis not present

## 2022-07-28 LAB — GLUCOSE, CAPILLARY: Glucose-Capillary: 103 mg/dL — ABNORMAL HIGH (ref 70–99)

## 2022-07-28 MED ORDER — LISINOPRIL 20 MG PO TABS: 20.0000 mg | ORAL_TABLET | Freq: Every day | ORAL | 2 refills | Status: AC

## 2022-07-28 MED ORDER — LISINOPRIL 20 MG PO TABS
20.0000 mg | ORAL_TABLET | Freq: Every day | ORAL | Status: DC
  Administered 2022-07-28: 20 mg via ORAL
  Filled 2022-07-28: qty 1

## 2022-07-28 NOTE — Discharge Summary (Signed)
Physician Discharge Summary  Jayco Prajapati [id number] DOB: 11/30/1934 DOA: 07/26/2022  PCP: Ria Bush, MD  Admit date: 07/26/2022 Discharge date: 07/28/2022  Admitted From: Facility Disposition:  Same  Recommendations for Outpatient Follow-up:  Follow up with PCP in 1-2 weeks Follow-up with neurology as scheduled  Discharge Condition: Stable  CODE STATUS: DNR Diet recommendation: As tolerated, would liberalize diet to ensure appropriate caloric intake  Brief/Interim Summary: Levone Brecker is a 88 y.o. male with medical history significant for Parkinsons/Dementia who at baseline can recognize wife and answer simple questions but is not Aox4 or hold a conversation, HTN, PE. Patient was brought to the ED from nursing home with reports of altered mental status, confusion, and poor responsiveness - recently discharged to SNF on 07/14/22 for recurrent falls, small subsegmental PE requiring ongoing PT.   Presented with acute metabolic encephalopathy - neurology consulted given concern over possible intracranial etiology. MRI limited but without overt findings. EEG unremarkable for epileptiform activity. Concern for worsening parkinsons/dementia with underlying delirium exacerbated by polypharmacy -recommend avoiding haldol and would consider decreasing dose of seroquel in the future.  Neurology discussed pimavanserin could be considered as an alternative if episodes continue despite current regimen. Punctate stroke on MRI likely incidental given motion degradation and lack of concurrent physical exam findings.  Discussed possible event monitor at discharge however given patient is not amenable to anticoagulation due to increased fall risk event monitor would not change clinical course.  Patient medically stable this time for discharge, Wife indicates she will not allow patient to be discharged back to prior facility due to concern over mistreatment and mismanagement.  She is following  with facility in [place] near her children for possible discharge, case management involved to assist with disposition.  Patient's wife to take patient home today with plans to transport patient to [place] to memory care unit that she has been working with. Will offer HHPT/OT in the interim to help improve patient's mobility/safety in the interim while she sets up this disposition.  Discharge Diagnoses:  Principal Problem:   Acute encephalopathy Active Problems:   BPH without obstruction/lower urinary tract symptoms   Essential hypertension   Parkinson disease   Pulmonary embolism (HCC)  Acute encephalopathy, resolved Likely polypharmacy Cannot rule out seizure Acute punctate CVA -MRI brain motion degraded, hypodensity in globus pallidus bilaterally- artifactual, acute hypoxic/ischemic or toxic metabolic insult not excluded, punctate acute infarct in right cerebellum. -Neurologist consulted as above, no medication changes recommended other than weaning Seroquel and Haldol use -Okay to resume gabapentin, Sinemet - continue to hold Seroquel/haldol and monitor -Recent echo 07/08/2022, EF of 50 to 55%, G1DD, will defer need for full stroke workup to neurology   Pulmonary embolism ([place]) Diagnosed with PE 07/07/22- CTA- isolated segmental PE in right lower lobe, small overall clot burden PE 07/07/2022, initially placed on heparin drip, this was discontinued due 2 history of frequent falls.  He was discharged on aspirin.   Parkinson disease Parkinson's with baseline dementia.  Discharged to nursing home 2/9 after recent hospitalization.  At baseline can answer simple questions, recognizes family.  Uses wheelchair at baseline.   Essential hypertension Stable. -Resume lisinopril at higher dose('20mg'$ ) and HCTZ as BP remains high/normal   BPH without obstruction/lower urinary tract symptoms Foley placed in ED - trial removal in the next 24h  Discharge Instructions  Discharge Instructions      Face-to-face encounter (required for Medicare/Medicaid patients)   Complete by: As directed    I Little Ishikawa  certify that this patient is under my care and that I, or a nurse practitioner or physician's assistant working with me, had a face-to-face encounter that meets the physician face-to-face encounter requirements with this patient on 07/28/2022. The encounter with the patient was in whole, or in part for the following medical condition(s) which is the primary reason for home health care (List medical condition): Ambulatory dysfunction   The encounter with the patient was in whole, or in part, for the following medical condition, which is the primary reason for home health care: ambulatory dysfunction   I certify that, based on my findings, the following services are medically necessary home health services: Physical therapy   Reason for Medically Necessary Home Health Services: Therapy- Personnel officer, Public librarian   My clinical findings support the need for the above services: Unable to leave home safely without assistance and/or assistive device   Further, I certify that my clinical findings support that this patient is homebound due to: Unable to leave home safely without assistance   Home Health   Complete by: As directed    To provide the following care/treatments:  PT OT        Allergies as of 07/28/2022       Reactions   Solifenacin Other (See Comments)   Hallucinations (per uro)        Medication List     STOP taking these medications    QUEtiapine 50 MG tablet Commonly known as: SEROquel       TAKE these medications    aspirin EC 81 MG tablet Take 81 mg by mouth daily. Swallow whole.   bimatoprost 0.01 % Soln Commonly known as: LUMIGAN Place 1 drop into both eyes at bedtime.   carbidopa-levodopa 25-100 MG tablet Commonly known as: SINEMET IR Take 2 tablets by mouth 4 (four) times daily.   Cholecalciferol 125 MCG (5000 UT)  Tabs Take 5,000 Units by mouth every Wednesday.   gabapentin 100 MG capsule Commonly known as: NEURONTIN Take 100 mg by mouth at bedtime.   hydrochlorothiazide 12.5 MG tablet Commonly known as: HYDRODIURIL Take 0.5 tablets (6.25 mg total) by mouth daily.   lisinopril 20 MG tablet Commonly known as: ZESTRIL Take 1 tablet (20 mg total) by mouth daily. What changed:  medication strength how much to take when to take this   MULTIVITAMIN ADULT PO Take 1 tablet by mouth daily.   polyethylene glycol powder 17 GM/SCOOP powder Commonly known as: GLYCOLAX/MIRALAX Take 17 g by mouth daily as needed for moderate constipation.   sennosides-docusate sodium 8.6-50 MG tablet Commonly known as: SENOKOT-S Take 1 tablet by mouth daily.   timolol 0.5 % ophthalmic solution Commonly known as: TIMOPTIC Place 1 drop into both eyes daily.   vitamin B-12 500 MCG tablet Commonly known as: CYANOCOBALAMIN Take 500 mcg by mouth daily.        Allergies  Allergen Reactions   Solifenacin Other (See Comments)    Hallucinations (per uro)    Consultations: Neuro  Procedures/Studies: EEG adult  Result Date: 08/06/2022 Lora Havens, MD     2022-08-06  8:42 AM Patient Name: Nicholson Horne MRN: [id number] Epilepsy Attending: Lora Havens Referring Physician/Provider: Amie Portland, MD Date: 07/26/2022 Duration: 30.58 mins Patient history: 88 year old male with AMS. EEG to evaluate for seizure Level of alertness: Awake, asleep AEDs during EEG study: None Technical aspects: This EEG study was done with scalp electrodes positioned according to the 10-20 International system  of electrode placement. Electrical activity was reviewed with band pass filter of 1-'70Hz'$ , sensitivity of 7 uV/mm, display speed of 63m/sec with a '60Hz'$  notched filter applied as appropriate. EEG data were recorded continuously and digitally stored.  Video monitoring was available and reviewed as appropriate. Description: The  posterior dominant rhythm consists of 7 Hz activity of moderate voltage (25-35 uV) seen predominantly in posterior head regions, symmetric and reactive to eye opening and eye closing. Sleep was characterized by vertex waves, sleep spindles (12 to 14 Hz), maximal frontocentral region. EEG showed continuous generalized predominantly 5 to 7 Hz theta-delta slowing admixed with intermittent generalized 2-'3Hz'$  delta slowing. Physiologic photic driving was not seen during photic stimulation.  Hyperventilation was not performed.   ABNORMALITY - Continuous slow, generalized - Background slow IMPRESSION: This study is suggestive of moderate diffuse encephalopathy, nonspecific etiology. No seizures or epileptiform discharges were seen throughout the recording. PLora Havens  DG Humerus Left  Result Date: 07/26/2022 CLINICAL DATA:  Left arm bruising, initial encounter EXAM: LEFT HUMERUS - 2+ VIEW COMPARISON:  None Available. FINDINGS: Mild degenerative changes of the acromioclavicular joint are seen. No acute fracture or dislocation in the humerus is seen. No soft tissue abnormality is noted. IMPRESSION: No acute abnormality noted. Electronically Signed   By: MInez CatalinaM.D.   On: 07/26/2022 19:12   DG Chest Port 1 View  Result Date: 07/26/2022 CLINICAL DATA:  Mental status change. EXAM: PORTABLE CHEST 1 VIEW COMPARISON:  05/04/2022 FINDINGS: Poor inspiration. Heart size upper limits of normal. Chronic aortic atherosclerosis. Poor aeration of the lung bases, presumably subsequent to the poor inspiration. Cannot rule out mild basilar infiltrate/atelectasis, but the findings may just be subsequent to the poor inspiration. IMPRESSION: Poor aeration of the lung bases, presumably subsequent to the poor inspiration. Cannot rule out mild basilar infiltrate/atelectasis. Electronically Signed   By: MNelson ChimesM.D.   On: 07/26/2022 15:40   MR BRAIN WO CONTRAST  Result Date: 07/26/2022 CLINICAL DATA:  Delirium EXAM: MRI  HEAD WITHOUT CONTRAST TECHNIQUE: Multiplanar, multiecho pulse sequences of the brain and surrounding structures were obtained without intravenous contrast. COMPARISON:  MRI head February 2024. FINDINGS: Moderate to severely motion limited study.  Within this limitation: Brain: Significantly motion limited study with somewhat linear appearing DWI hyperintensity and FLAIR hyperintensity in the globus palladi bilaterally. Additional more convincing acute infarct in the right cerebellum with punctate focus of restricted diffusion on series 9, image 10. No evidence of acute hemorrhage, mass lesion, midline shift or hydrocephalus. Cerebral atrophy. Remote right cerebellar infarct. Vascular: Major arterial flow voids are maintained at the skull base. Skull and upper cervical spine: Normal marrow signal. Motion limited. Sinuses/Orbits: Negative. Other: No mastoid effusions. IMPRESSION: 1. Moderate to severely motion limited study with somewhat linear appearing DWI hyperintensity and possible FLAIR hyperintensity in the globus palladi bilaterally. While this could be artifactual, acute hypoxic/ischemic or toxic/metabolic (including carbon monoxide) insult is not excluded. If the patient does not improve clinically, a follow-up MRI could assess for change. 2. Punctate acute infarct in the right cerebellum. No significant MR mass effect. Electronically Signed   By: FMargaretha SheffieldM.D.   On: 07/26/2022 15:03   CT ANGIO HEAD NECK W WO CM W PERF (CODE STROKE)  Result Date: 07/26/2022 CLINICAL DATA:  Neuro deficit, acute, stroke suspected EXAM: CT ANGIOGRAPHY HEAD AND NECK CT PERFUSION BRAIN TECHNIQUE: Multidetector CT imaging of the head and neck was performed using the standard protocol during bolus administration of intravenous contrast.  Multiplanar CT image reconstructions and MIPs were obtained to evaluate the vascular anatomy. Carotid stenosis measurements (when applicable) are obtained utilizing NASCET criteria,  using the distal internal carotid diameter as the denominator. Multiphase CT imaging of the brain was performed following IV bolus contrast injection. Subsequent parametric perfusion maps were calculated using RAPID software. RADIATION DOSE REDUCTION: This exam was performed according to the departmental dose-optimization program which includes automated exposure control, adjustment of the mA and/or kV according to patient size and/or use of iterative reconstruction technique. CONTRAST:  146m OMNIPAQUE IOHEXOL 350 MG/ML SOLN COMPARISON:  Head CT immediately prior.  MRI 07/06/2022 FINDINGS: CTA NECK FINDINGS Aortic arch: Mild aortic atherosclerotic calcification. Branching pattern is normal without origin stenosis. Right carotid system: Common carotid artery widely patent to the bifurcation. Minimal plaque at the bifurcation but no stenosis. Cervical ICA widely patent. Left carotid system: Common carotid artery widely patent to the bifurcation. Bifurcation is normal. Cervical ICA is normal. Vertebral arteries: No proximal subclavian stenosis. Both vertebral artery origins are widely patent. Both vertebral arteries are normal through the cervical region to the foramen magnum. Skeleton: Ordinary chronic cervical spondylosis. Other neck: No mass or lymphadenopathy. Upper chest: Lung apices are clear. Review of the MIP images confirms the above findings CTA HEAD FINDINGS Anterior circulation: Both internal carotid arteries are patent through the skull base and siphon regions. There is ordinary siphon atherosclerotic calcification but no stenosis. The anterior and middle cerebral arteries are patent. No large vessel occlusion or proximal stenosis. No aneurysm or vascular malformation. Distal branch vessels do show some atherosclerotic irregularity. Posterior circulation: Both vertebral arteries are patent through the foramen magnum to the basilar artery. There is calcified plaque in both vertebral artery V4 segments. No  stenosis on the right. 30-50% stenosis on the left. No basilar artery stenosis. Superior cerebellar and posterior cerebral arteries are patent. Venous sinuses: Patent and normal. Anatomic variants: Azygous anterior cerebral artery. Review of the MIP images confirms the above findings CT Brain Perfusion Findings: ASPECTS: 10 allowing for old strokes. CBF (<30%) Volume: 0727mPerfusion (Tmax>6.0s) volume: 27m17mismatch Volume: 27mL627mfarction Location:None IMPRESSION: 1. Negative perfusion study. No evidence of core infarction or penumbra. 2. No intracranial large vessel occlusion or flow limiting proximal stenosis. Distal vessel atherosclerotic irregularity. 3. Mild/minimal atherosclerotic change of the aorta and carotid bifurcations but without stenosis. 4. 30-50% stenosis of the left vertebral artery V4 segment. Aortic Atherosclerosis (ICD10-I70.0). Electronically Signed   By: MarkNelson Chimes.   On: 07/26/2022 13:21   CT HEAD CODE STROKE WO CONTRAST  Result Date: 07/26/2022 CLINICAL DATA:  Code stroke. Neuro deficit, acute, stroke suspected. Altered mental status. EXAM: CT HEAD WITHOUT CONTRAST TECHNIQUE: Contiguous axial images were obtained from the base of the skull through the vertex without intravenous contrast. RADIATION DOSE REDUCTION: This exam was performed according to the departmental dose-optimization program which includes automated exposure control, adjustment of the mA and/or kV according to patient size and/or use of iterative reconstruction technique. COMPARISON:  Head CT and MRI 07/06/2022 FINDINGS: Brain: There is no evidence of an acute infarct, intracranial hemorrhage, mass, midline shift, or extra-axial fluid collection. Chronic right frontal and cerebellar infarcts are again noted. Patchy hypodensities in the cerebral white matter bilaterally are unchanged and nonspecific but compatible with moderate chronic small vessel ischemic disease. There is moderate cerebral atrophy. Vascular:  Calcified atherosclerosis at the skull base. No hyperdense vessel. Skull: No fracture or suspicious osseous lesion. Sinuses/Orbits: Paranasal sinuses and mastoid air cells are clear. Unremarkable  orbits. Other: None. These results were called by telephone at the time of interpretation on 07/26/2022 at 12:59 p.m. to Dr. Milton Ferguson, who verbally acknowledged these results. IMPRESSION: 1. No evidence of acute intracranial abnormality. 2. Moderate chronic small vessel ischemic disease and cerebral atrophy. 3. Chronic right frontal and cerebellar infarcts. Electronically Signed   By: Logan Bores M.D.   On: 07/26/2022 13:00   ECHOCARDIOGRAM COMPLETE  Result Date: 07/08/2022    ECHOCARDIOGRAM REPORT   Patient Name:   ESTANISLAO TORSTENSON Nole Date of Exam: 07/08/2022 Medical Rec #:  [id number]          Height:       70.0 in Accession #:    [id number]         Weight:       210.3 lb Date of Birth:  October 01, 1934           BSA:          2.132 m Patient Age:    52 years           BP:           155/64 mmHg Patient Gender: M                  HR:           52 bpm. Exam Location:  [facility] Procedure: 2D Echo Indications:     Stroke I63.9  History:         Patient has no prior history of Echocardiogram examinations.                  Stroke.  Sonographer:     Wayland Salinas Referring Phys:  [place] Diagnosing Phys: Neoma Laming  Sonographer Comments: Technically difficult study due to poor echo windows, suboptimal parasternal window and suboptimal subcostal window. Image acquisition challenging due to patient body habitus and Image acquisition challenging due to respiratory motion. SSN not visualized IMPRESSIONS  1. Left ventricular ejection fraction, by estimation, is 50 to 55%. The left ventricle has low normal function. The left ventricle has no regional wall motion abnormalities. There is moderate concentric left ventricular hypertrophy. Left ventricular diastolic parameters are consistent with Grade I diastolic dysfunction  (impaired relaxation).  2. Right ventricular systolic function is normal. The right ventricular size is normal.  3. Left atrial size was moderately dilated.  4. Right atrial size was mildly dilated.  5. The mitral valve is normal in structure. Trivial mitral valve regurgitation. No evidence of mitral stenosis.  6. The aortic valve is normal in structure. Aortic valve regurgitation is not visualized. Aortic valve sclerosis is present, with no evidence of aortic valve stenosis.  7. The inferior vena cava is normal in size with greater than 50% respiratory variability, suggesting right atrial pressure of 3 mmHg. FINDINGS  Left Ventricle: Left ventricular ejection fraction, by estimation, is 50 to 55%. The left ventricle has low normal function. The left ventricle has no regional wall motion abnormalities. The left ventricular internal cavity size was normal in size. There is moderate concentric left ventricular hypertrophy. Left ventricular diastolic parameters are consistent with Grade I diastolic dysfunction (impaired relaxation). Right Ventricle: The right ventricular size is normal. No increase in right ventricular wall thickness. Right ventricular systolic function is normal. Left Atrium: Left atrial size was moderately dilated. Right Atrium: Right atrial size was mildly dilated. Pericardium: There is no evidence of pericardial effusion. Mitral Valve: The mitral valve is normal in structure. Trivial mitral valve  regurgitation. No evidence of mitral valve stenosis. Tricuspid Valve: The tricuspid valve is normal in structure. Tricuspid valve regurgitation is trivial. No evidence of tricuspid stenosis. Aortic Valve: The aortic valve is normal in structure. Aortic valve regurgitation is not visualized. Aortic valve sclerosis is present, with no evidence of aortic valve stenosis. Aortic valve peak gradient measures 5.4 mmHg. Pulmonic Valve: The pulmonic valve was normal in structure. Pulmonic valve regurgitation is not  visualized. No evidence of pulmonic stenosis. Aorta: The aortic root is normal in size and structure. Venous: The inferior vena cava is normal in size with greater than 50% respiratory variability, suggesting right atrial pressure of 3 mmHg. IAS/Shunts: No atrial level shunt detected by color flow Doppler.  LEFT VENTRICLE PLAX 2D LVIDd:         5.20 cm   Diastology LVIDs:         3.70 cm   LV e' medial:    5.66 cm/s LV PW:         1.30 cm   LV E/e' medial:  13.7 LV IVS:        1.30 cm   LV e' lateral:   6.96 cm/s LVOT diam:     2.30 cm   LV E/e' lateral: 11.1 LVOT Area:     4.15 cm  RIGHT VENTRICLE RV S prime:     16.40 cm/s TAPSE (M-mode): 3.2 cm LEFT ATRIUM             Index        RIGHT ATRIUM           Index LA diam:        4.20 cm 1.97 cm/m   RA Area:     19.60 cm LA Vol (A2C):   53.9 ml 25.28 ml/m  RA Volume:   49.40 ml  23.17 ml/m LA Vol (A4C):   84.6 ml 39.67 ml/m LA Biplane Vol: 66.7 ml 31.28 ml/m  AORTIC VALVE                 PULMONIC VALVE AV Area (Vmax): 3.78 cm     PV Vmax:        1.22 m/s AV Vmax:        116.50 cm/s  PV Peak grad:   6.0 mmHg AV Peak Grad:   5.4 mmHg     RVOT Peak grad: 2 mmHg LVOT Vmax:      106.00 cm/s  AORTA Ao Root diam: 3.70 cm MITRAL VALVE MV Area (PHT): 3.27 cm    SHUNTS MV Decel Time: 232 msec    Systemic Diam: 2.30 cm MV E velocity: 77.60 cm/s MV A velocity: 89.10 cm/s MV E/A ratio:  0.87 Shaukat Khan Electronically signed by Neoma Laming Signature Date/Time: 07/08/2022/12:29:15 PM    Final    EEG adult  Result Date: 07/07/2022 Derek Jack, MD     07/07/2022  6:48 PM Routine EEG Report Geordan Domenico Mcclary is a 88 y.o. male with a history of syncope who is undergoing an EEG to evaluate for seizures. Report: This EEG was acquired with electrodes placed according to the International 10-20 electrode system (including Fp1, Fp2, F3, F4, C3, C4, P3, P4, O1, O2, T3, T4, T5, T6, A1, A2, Fz, Cz, Pz). The following electrodes were missing or displaced: none. The occipital  dominant rhythm was 5-6 Hz. This activity is reactive to stimulation. Drowsiness was manifested by background fragmentation; deeper stages of sleep were identified by K complexes and sleep spindles. There  was no focal slowing. There were no interictal epileptiform discharges. There were no electrographic seizures identified. Photic stimulation and hyperventilation were not performed. Impression and clinical correlation: This EEG was obtained while awake and asleep and is abnormal due to moderate diffuse slowing indicative of global cerebral dysfunction. Epileptiform abnormalities were not seen during this recording. [name], MD [facility] [phone] If 7pm- 7am, please page neurology on call as listed in [place].   CT PELVIS WO CONTRAST  Result Date: 07/07/2022 CLINICAL DATA:  Pelvic fracture. EXAM: CT PELVIS WITHOUT CONTRAST TECHNIQUE: Multidetector CT imaging of the pelvis was performed following the standard protocol without intravenous contrast. RADIATION DOSE REDUCTION: This exam was performed according to the departmental dose-optimization program which includes automated exposure control, adjustment of the mA and/or kV according to patient size and/or use of iterative reconstruction technique. COMPARISON:  July 06, 2022. FINDINGS: Urinary Tract: No definite abnormality seen involving the visualized ureters or bladder. Bowel:  Unremarkable visualized pelvic bowel loops. Vascular/Lymphatic: No pathologically enlarged lymph nodes. No significant vascular abnormality seen. Reproductive:  Prostate is unremarkable. Other: Small fat containing left inguinal hernia. No ascites is noted. Musculoskeletal: No fracture is noted. IMPRESSION: No definite pelvic fracture is noted. Small fat containing left inguinal hernia. Electronically Signed   By: Marijo Conception M.D.   On: 07/07/2022 12:52   US Venous Img Lower Bilateral (DVT)  Result Date: 07/07/2022 CLINICAL DATA:  Pulmonary embolism.   Evaluate for DVT. EXAM: BILATERAL LOWER EXTREMITY VENOUS DOPPLER ULTRASOUND TECHNIQUE: Gray-scale sonography with graded compression, as well as color Doppler and duplex ultrasound were performed to evaluate the lower extremity deep venous systems from the level of the common femoral vein and including the common femoral, femoral, profunda femoral, popliteal and calf veins including the posterior tibial, peroneal and gastrocnemius veins when visible. The superficial great saphenous vein was also interrogated. Spectral Doppler was utilized to evaluate flow at rest and with distal augmentation maneuvers in the common femoral, femoral and popliteal veins. COMPARISON:  None Available. FINDINGS: RIGHT LOWER EXTREMITY Common Femoral Vein: No evidence of thrombus. Normal compressibility, respiratory phasicity and response to augmentation. Saphenofemoral Junction: No evidence of thrombus. Normal compressibility and flow on color Doppler imaging. Profunda Femoral Vein: No evidence of thrombus. Normal compressibility and flow on color Doppler imaging. Femoral Vein: No evidence of thrombus. Normal compressibility, respiratory phasicity and response to augmentation. Popliteal Vein: No evidence of thrombus. Normal compressibility, respiratory phasicity and response to augmentation. Calf Veins: No evidence of thrombus. Normal compressibility and flow on color Doppler imaging. Superficial Great Saphenous Vein: No evidence of thrombus. Normal compressibility. Other Findings:  None. LEFT LOWER EXTREMITY Common Femoral Vein: No evidence of thrombus. Normal compressibility, respiratory phasicity and response to augmentation. Saphenofemoral Junction: No evidence of thrombus. Normal compressibility and flow on color Doppler imaging. Profunda Femoral Vein: No evidence of thrombus. Normal compressibility and flow on color Doppler imaging. Femoral Vein: No evidence of thrombus. Normal compressibility, respiratory phasicity and response to  augmentation. Popliteal Vein: No evidence of thrombus. Normal compressibility, respiratory phasicity and response to augmentation. Calf Veins: No evidence of thrombus. Normal compressibility and flow on color Doppler imaging. Superficial Great Saphenous Vein: No evidence of thrombus. Normal compressibility. Other Findings:  None. IMPRESSION: No evidence of DVT within either lower extremity. Electronically Signed   By: Sandi Mariscal M.D.   On: 07/07/2022 12:34   CT Angio Chest Pulmonary Embolism (PE) W or WO Contrast  Result Date: 07/07/2022 CLINICAL DATA:  Fall, elevated D-dimer, evaluate  for PE EXAM: CT ANGIOGRAPHY CHEST WITH CONTRAST TECHNIQUE: Multidetector CT imaging of the chest was performed using the standard protocol during bolus administration of intravenous contrast. Multiplanar CT image reconstructions and MIPs were obtained to evaluate the vascular anatomy. RADIATION DOSE REDUCTION: This exam was performed according to the departmental dose-optimization program which includes automated exposure control, adjustment of the mA and/or kV according to patient size and/or use of iterative reconstruction technique. CONTRAST:  77m OMNIPAQUE IOHEXOL 350 MG/ML SOLN COMPARISON:  Chest radiograph dated 05/04/2022 FINDINGS: Cardiovascular: Satisfactory opacification of the bilateral pulmonary arteries to the segmental level. Evaluation is constrained due to motion degradation. Within that constraint, there is isolated segmental pulmonary embolism in the right lower lobe pulmonary artery (series 5/image 164). Overall clot burden is very small. Although not tailored for evaluation of the thoracic aorta, there is no evidence thoracic aortic aneurysm or dissection. Mild atherosclerotic calcifications of the arch. Severe three-coronary atherosclerosis, lad predominant. Mediastinum/Nodes: 1.7 cm short axis right paratracheal node (series 5/image 78). Visualized thyroid is unremarkable. Lungs/Pleura: Evaluation lung  parenchyma is constrained by respiratory motion. Within that constraint, there are no suspicious pulmonary nodules. No focal consolidation. No pleural effusion or pneumothorax. Upper Abdomen: Visualized upper abdomen is notable for cholelithiasis, vascular calcifications, and a central hepatic cyst. Musculoskeletal: Degenerative changes of the visualized thoracolumbar spine. Review of the MIP images confirms the above findings. IMPRESSION: Isolated segmental pulmonary embolism in the right lower lobe pulmonary artery. Overall clot burden is very small. 1.7 cm short axis right paratracheal node, nonspecific. Consider follow-up CT chest in 3 months. These results will be called to the ordering clinician or representative by the Radiologist Assistant, and communication documented in the PACS or [employer] Aortic Atherosclerosis (ICD10-I70.0). Electronically Signed   By: SJulian HyM.D.   On: 07/07/2022 03:26   MR BRAIN WO CONTRAST  Result Date: 07/06/2022 CLINICAL DATA:  Altered mental status EXAM: MRI HEAD WITHOUT CONTRAST TECHNIQUE: Multiplanar, multiecho pulse sequences of the brain and surrounding structures were obtained without intravenous contrast. COMPARISON:  06/15/2020 FINDINGS: Brain: No acute infarct, mass effect or extra-axial collection. Fewer than 5 scattered microhemorrhages in a nonspecific pattern. There is multifocal hyperintense T2-weighted signal within the white matter. Generalized volume loss. 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 The midline structures are normal. Vascular: Normal flow voids. Skull and upper cervical spine: Normal marrow signal. Sinuses/Orbits: Negative. Other: None. IMPRESSION: 1. No acute intracranial abnormality. 2. 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 Electronically Signed   By: KUlyses JarredM.D.   On: 07/06/2022 23:16   DG Hip Unilat W or Wo Pelvis 2-3 Views Right  Result  Date: 07/06/2022 CLINICAL DATA:  right leg pain, fall EXAM: DG HIP (WITH OR WITHOUT PELVIS) 2-3V RIGHT COMPARISON:  None Available. FINDINGS: There is no evidence of acute right hip fracture. There is a possible fracture of the right sacral ilia. 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 Degenerative changes of the right SI joint and the pubic symphysis. IMPRESSION: Possible fracture of the right sacral ala. Consider CT of the pelvis. No evidence of acute right hip fracture. Electronically Signed   By: JMaurine SimmeringM.D.   On: 07/06/2022 18:02   CT Head Wo Contrast  Result Date: 07/06/2022 CLINICAL DATA:  Head trauma, minor. Four falls since yesterday. Neck trauma. EXAM: CT HEAD WITHOUT CONTRAST CT CERVICAL SPINE WITHOUT CONTRAST TECHNIQUE: Multidetector CT imaging of the head and cervical spine was performed following  the standard protocol without intravenous contrast. Multiplanar CT image reconstructions of the cervical spine were also generated. RADIATION DOSE REDUCTION: This exam was performed according to the departmental dose-optimization program which includes automated exposure control, adjustment of the mA and/or kV according to patient size and/or use of iterative reconstruction technique. COMPARISON:  CT head and cervical spine 05/03/2022. FINDINGS: CT HEAD FINDINGS Brain: No acute hemorrhage. Unchanged chronic small-vessel disease. 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 Old lacunar infarct in the right cerebellar hemisphere. Cortical gray-white differentiation is otherwise preserved. Prominence of the ventricles and sulci within normal limits for age. No extra-axial collection. Basilar cisterns are patent. Vascular: No hyperdense vessel or unexpected calcification. Skull: No calvarial fracture or suspicious bone lesion. Skull base is unremarkable. Sinuses/Orbits: Paranasal sinuses, mastoid air cells, and middle ear cavities are well aerated. Orbits are unremarkable. Other: No  scalp hematoma. CT CERVICAL SPINE FINDINGS Alignment: 2 mm anterolisthesis of C4 on C5. Degenerative reversal of the normal cervical lordosis. Skull base and vertebrae: No acute fracture. Normal craniocervical junction. No suspicious bone lesions. Soft tissues and spinal canal: No prevertebral fluid or swelling. No visible canal hematoma. Disc levels:  No high-grade spinal canal stenosis. Upper chest: Unremarkable. Other: None. IMPRESSION: 1. No acute intracranial abnormality. 2. No acute cervical spine fracture or traumatic malalignment. Electronically Signed   By: Emmit Alexanders M.D.   On: 07/06/2022 15:25   CT Cervical Spine Wo Contrast  Result Date: 07/06/2022 CLINICAL DATA:  Head trauma, minor. Four falls since yesterday. Neck trauma. EXAM: CT HEAD WITHOUT CONTRAST CT CERVICAL SPINE WITHOUT CONTRAST TECHNIQUE: Multidetector CT imaging of the head and cervical spine was performed following the standard protocol without intravenous contrast. Multiplanar CT image reconstructions of the cervical spine were also generated. RADIATION DOSE REDUCTION: This exam was performed according to the departmental dose-optimization program which includes automated exposure control, adjustment of the mA and/or kV according to patient size and/or use of iterative reconstruction technique. COMPARISON:  CT head and cervical spine 05/03/2022. FINDINGS: CT HEAD FINDINGS Brain: No acute hemorrhage. Unchanged chronic small-vessel disease. 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 Old lacunar infarct in the right cerebellar hemisphere. Cortical gray-white differentiation is otherwise preserved. Prominence of the ventricles and sulci within normal limits for age. No extra-axial collection. Basilar cisterns are patent. Vascular: No hyperdense vessel or unexpected calcification. Skull: No calvarial fracture or suspicious bone lesion. Skull base is unremarkable. Sinuses/Orbits: Paranasal sinuses, mastoid air cells, and  middle ear cavities are well aerated. Orbits are unremarkable. Other: No scalp hematoma. CT CERVICAL SPINE FINDINGS Alignment: 2 mm anterolisthesis of C4 on C5. Degenerative reversal of the normal cervical lordosis. Skull base and vertebrae: No acute fracture. Normal craniocervical junction. No suspicious bone lesions. Soft tissues and spinal canal: No prevertebral fluid or swelling. No visible canal hematoma. Disc levels:  No high-grade spinal canal stenosis. Upper chest: Unremarkable. Other: None. IMPRESSION: 1. No acute intracranial abnormality. 2. No acute cervical spine fracture or traumatic malalignment. Electronically Signed   By: Emmit Alexanders M.D.   On: 07/06/2022 15:25     Subjective: No acute issues or events overnight, appears to be back to baseline per wife at bedside   Discharge Exam: Vitals:   07/27/22 2347 07/28/22 0441  BP: (!) 182/83 (!) 187/94  Pulse: (!) 52 67  Resp: 16 16  Temp: 98.9 F (37.2 C) 99.4 F (37.4 C)  SpO2: 97% 97%   Vitals:   07/27/22 1521 07/27/22 2035 07/27/22 2347  07/28/22 0441  BP: (!) 176/66 (!) 196/91 (!) 182/83 (!) 187/94  Pulse: (!) 54 (!) 55 (!) 52 67  Resp: '18 18 16 16  '$ Temp: 97.8 F (36.6 C) 98.6 F (37 C) 98.9 F (37.2 C) 99.4 F (37.4 C)  TempSrc: Oral Oral Oral Axillary  SpO2: 97% 96% 97% 97%  Weight:      Height:        General: Pt is alert, awake, not in acute distress Cardiovascular: RRR, S1/S2 +, no rubs, no gallops Respiratory: CTA bilaterally, no wheezing, no rhonchi Abdominal: Soft, NT, ND, bowel sounds + Extremities: no edema, no cyanosis   The results of significant diagnostics from this hospitalization (including imaging, microbiology, ancillary and laboratory) are listed below for reference.     Microbiology: Recent Results (from the past 240 hour(s))  Blood culture (routine x 2)     Status: None (Preliminary result)   Collection Time: 07/26/22  1:51 PM   Specimen: Right Antecubital; Blood  Result Value Ref  Range Status   Specimen Description RIGHT ANTECUBITAL  Final   Special Requests   Final    BOTTLES DRAWN AEROBIC AND ANAEROBIC Blood Culture adequate volume   Culture   Final    NO GROWTH 2 DAYS Performed at [facility], [address] [place], [place] [address]    Report Status PENDING  Incomplete  Blood culture (routine x 2)     Status: None (Preliminary result)   Collection Time: 07/26/22  2:06 PM   Specimen: BLOOD RIGHT HAND  Result Value Ref Range Status   Specimen Description BLOOD RIGHT HAND  Final   Special Requests   Final    BOTTLES DRAWN AEROBIC AND ANAEROBIC Blood Culture adequate volume   Culture   Final    NO GROWTH 2 DAYS Performed at [facility], [address], [place], [place] [address]    Report Status PENDING  Incomplete  Resp panel by RT-PCR (RSV, Flu A&B, Covid) Anterior Nasal Swab     Status: None   Collection Time: 07/26/22  2:49 PM   Specimen: Anterior Nasal Swab  Result Value Ref Range Status   SARS Coronavirus 2 by RT PCR NEGATIVE NEGATIVE Final    Comment: (NOTE) SARS-CoV-2 target nucleic acids are NOT DETECTED.  The SARS-CoV-2 RNA is generally detectable in upper respiratory specimens during the acute phase of infection. The lowest concentration of SARS-CoV-2 viral copies this assay can detect is 138 copies/mL. A negative result does not preclude SARS-Cov-2 infection and should not be used as the sole basis for treatment or other patient management decisions. A negative result may occur with  improper specimen collection/handling, submission of specimen other than nasopharyngeal swab, presence of viral mutation(s) within the areas targeted by this assay, and inadequate number of viral copies(<138 copies/mL). A negative result must be combined with clinical observations, patient history, and epidemiological information. The expected result is Negative.  Fact Sheet for Patients:  EntrepreneurPulse.com.au  Fact Sheet for  Healthcare Providers:  IncredibleEmployment.be  This test is no t yet approved or cleared by the [place] FDA and  has been authorized for detection and/or diagnosis of SARS-CoV-2 by FDA under an Emergency Use Authorization (EUA). This EUA will remain  in effect (meaning this test can be used) for the duration of the COVID-19 declaration under Section 564(b)(1) of the Act, 21 U.S.C.section 360bbb-3(b)(1), unless the authorization is terminated  or revoked sooner.       Influenza A by PCR NEGATIVE NEGATIVE Final  Influenza B by PCR NEGATIVE NEGATIVE Final    Comment: (NOTE) The Xpert Xpress SARS-CoV-2/FLU/RSV plus assay is intended as an aid in the diagnosis of influenza from Nasopharyngeal swab specimens and should not be used as a sole basis for treatment. Nasal washings and aspirates are unacceptable for Xpert Xpress SARS-CoV-2/FLU/RSV testing.  Fact Sheet for Patients: EntrepreneurPulse.com.au  Fact Sheet for Healthcare Providers: IncredibleEmployment.be  This test is not yet approved or cleared by the [place] FDA and has been authorized for detection and/or diagnosis of SARS-CoV-2 by FDA under an Emergency Use Authorization (EUA). This EUA will remain in effect (meaning this test can be used) for the duration of the COVID-19 declaration under Section 564(b)(1) of the Act, 21 U.S.C. section 360bbb-3(b)(1), unless the authorization is terminated or revoked.     Resp Syncytial Virus by PCR NEGATIVE NEGATIVE Final    Comment: (NOTE) Fact Sheet for Patients: EntrepreneurPulse.com.au  Fact Sheet for Healthcare Providers: IncredibleEmployment.be  This test is not yet approved or cleared by the [place] FDA and has been authorized for detection and/or diagnosis of SARS-CoV-2 by FDA under an Emergency Use Authorization (EUA). This EUA will remain in effect (meaning this  test can be used) for the duration of the COVID-19 declaration under Section 564(b)(1) of the Act, 21 U.S.C. section 360bbb-3(b)(1), unless the authorization is terminated or revoked.  Performed at [facility], [address] [place], [place] [address]      Labs: BNP (last 3 results) No results for input(s): "BNP" in the last 8760 hours. Basic Metabolic Panel: Recent Labs  Lab 07/26/22 1351  NA 137  K 3.8  CL 104  CO2 27  GLUCOSE 109*  BUN 34*  CREATININE 1.20  CALCIUM 8.7*   Liver Function Tests: Recent Labs  Lab 07/26/22 1351  AST 58*  ALT 7  ALKPHOS 43  BILITOT 0.7  PROT 5.2*  ALBUMIN 2.9*   No results for input(s): "LIPASE", "AMYLASE" in the last 168 hours. No results for input(s): "AMMONIA" in the last 168 hours. CBC: Recent Labs  Lab 07/26/22 1351  WBC 6.1  NEUTROABS 4.1  HGB 12.0*  HCT 37.4*  MCV 94.2  PLT 161   Cardiac Enzymes: No results for input(s): "CKTOTAL", "CKMB", "CKMBINDEX", "TROPONINI" in the last 168 hours. BNP: Invalid input(s): "POCBNP" CBG: Recent Labs  Lab 07/26/22 1240 07/27/22 1142 07/27/22 1722 07/27/22 2124 07/28/22 0626  GLUCAP 124* 129* 141* 121* 103*   D-Dimer No results for input(s): "DDIMER" in the last 72 hours. Hgb A1c Recent Labs    07/26/22 2155  HGBA1C 5.0   Lipid Profile Recent Labs    07/27/22 0509  CHOL 110  HDL 23*  LDLCALC 67  TRIG 98  CHOLHDL 4.8   Thyroid function studies Recent Labs    07/26/22 1351  TSH 2.911   Anemia work up No results for input(s): "VITAMINB12", "FOLATE", "FERRITIN", "TIBC", "IRON", "RETICCTPCT" in the last 72 hours. Urinalysis    Component Value Date/Time   COLORURINE YELLOW 07/26/2022 1400   APPEARANCEUR CLEAR 07/26/2022 1400   LABSPEC 1.019 07/26/2022 1400   PHURINE 5.0 07/26/2022 1400   GLUCOSEU NEGATIVE 07/26/2022 1400   HGBUR NEGATIVE 07/26/2022 1400   BILIRUBINUR NEGATIVE 07/26/2022 1400   BILIRUBINUR negative 01/06/2020 1000   KETONESUR 5 (A)  07/26/2022 1400   PROTEINUR NEGATIVE 07/26/2022 1400   UROBILINOGEN 1.0 01/06/2020 1000   NITRITE NEGATIVE 07/26/2022 1400   LEUKOCYTESUR NEGATIVE 07/26/2022 1400   Sepsis Labs Recent Labs  Lab 07/26/22  1351  WBC 6.1   Microbiology Recent Results (from the past 240 hour(s))  Blood culture (routine x 2)     Status: None (Preliminary result)   Collection Time: 07/26/22  1:51 PM   Specimen: Right Antecubital; Blood  Result Value Ref Range Status   Specimen Description RIGHT ANTECUBITAL  Final   Special Requests   Final    BOTTLES DRAWN AEROBIC AND ANAEROBIC Blood Culture adequate volume   Culture   Final    NO GROWTH 2 DAYS Performed at [facility], [address], [place], [place] [address]    Report Status PENDING  Incomplete  Blood culture (routine x 2)     Status: None (Preliminary result)   Collection Time: 07/26/22  2:06 PM   Specimen: BLOOD RIGHT HAND  Result Value Ref Range Status   Specimen Description BLOOD RIGHT HAND  Final   Special Requests   Final    BOTTLES DRAWN AEROBIC AND ANAEROBIC Blood Culture adequate volume   Culture   Final    NO GROWTH 2 DAYS Performed at [facility], [address], [place], [place] [address]    Report Status PENDING  Incomplete  Resp panel by RT-PCR (RSV, Flu A&B, Covid) Anterior Nasal Swab     Status: None   Collection Time: 07/26/22  2:49 PM   Specimen: Anterior Nasal Swab  Result Value Ref Range Status   SARS Coronavirus 2 by RT PCR NEGATIVE NEGATIVE Final    Comment: (NOTE) SARS-CoV-2 target nucleic acids are NOT DETECTED.  The SARS-CoV-2 RNA is generally detectable in upper respiratory specimens during the acute phase of infection. The lowest concentration of SARS-CoV-2 viral copies this assay can detect is 138 copies/mL. A negative result does not preclude SARS-Cov-2 infection and should not be used as the sole basis for treatment or other patient management decisions. A negative result may occur with  improper specimen  collection/handling, submission of specimen other than nasopharyngeal swab, presence of viral mutation(s) within the areas targeted by this assay, and inadequate number of viral copies(<138 copies/mL). A negative result must be combined with clinical observations, patient history, and epidemiological information. The expected result is Negative.  Fact Sheet for Patients:  EntrepreneurPulse.com.au  Fact Sheet for Healthcare Providers:  IncredibleEmployment.be  This test is no t yet approved or cleared by the [place] FDA and  has been authorized for detection and/or diagnosis of SARS-CoV-2 by FDA under an Emergency Use Authorization (EUA). This EUA will remain  in effect (meaning this test can be used) for the duration of the COVID-19 declaration under Section 564(b)(1) of the Act, 21 U.S.C.section 360bbb-3(b)(1), unless the authorization is terminated  or revoked sooner.       Influenza A by PCR NEGATIVE NEGATIVE Final   Influenza B by PCR NEGATIVE NEGATIVE Final    Comment: (NOTE) The Xpert Xpress SARS-CoV-2/FLU/RSV plus assay is intended as an aid in the diagnosis of influenza from Nasopharyngeal swab specimens and should not be used as a sole basis for treatment. Nasal washings and aspirates are unacceptable for Xpert Xpress SARS-CoV-2/FLU/RSV testing.  Fact Sheet for Patients: EntrepreneurPulse.com.au  Fact Sheet for Healthcare Providers: IncredibleEmployment.be  This test is not yet approved or cleared by the [place] FDA and has been authorized for detection and/or diagnosis of SARS-CoV-2 by FDA under an Emergency Use Authorization (EUA). This EUA will remain in effect (meaning this test can be used) for the duration of the COVID-19 declaration under Section 564(b)(1) of the Act, 21 U.S.C.  section 360bbb-3(b)(1), unless the authorization is terminated or revoked.     Resp Syncytial  Virus by PCR NEGATIVE NEGATIVE Final    Comment: (NOTE) Fact Sheet for Patients: EntrepreneurPulse.com.au  Fact Sheet for Healthcare Providers: IncredibleEmployment.be  This test is not yet approved or cleared by the [place] FDA and has been authorized for detection and/or diagnosis of SARS-CoV-2 by FDA under an Emergency Use Authorization (EUA). This EUA will remain in effect (meaning this test can be used) for the duration of the COVID-19 declaration under Section 564(b)(1) of the Act, 21 U.S.C. section 360bbb-3(b)(1), unless the authorization is terminated or revoked.  Performed at [facility], [address], [place], [place] [address]      Time coordinating discharge: Over 30 minutes  SIGNED:   Little Ishikawa, DO [facility] 07/28/2022, 7:38 AM Pager   If 7PM-7AM, please contact night-coverage [email or web address]

## 2022-07-31 ENCOUNTER — Ambulatory Visit: Payer: Medicare Other | Admitting: Family Medicine

## 2022-07-31 LAB — CULTURE, BLOOD (ROUTINE X 2)
Culture: NO GROWTH
Culture: NO GROWTH
Special Requests: ADEQUATE
Special Requests: ADEQUATE

## 2022-08-02 DIAGNOSIS — G9009 Other idiopathic peripheral autonomic neuropathy: Secondary | ICD-10-CM | POA: Diagnosis not present

## 2022-08-02 DIAGNOSIS — G20B1 Parkinson's disease with dyskinesia, without mention of fluctuations: Secondary | ICD-10-CM | POA: Diagnosis not present

## 2022-08-02 DIAGNOSIS — I10 Essential (primary) hypertension: Secondary | ICD-10-CM | POA: Diagnosis not present

## 2022-08-02 DIAGNOSIS — M6281 Muscle weakness (generalized): Secondary | ICD-10-CM | POA: Diagnosis not present

## 2022-08-02 DIAGNOSIS — R296 Repeated falls: Secondary | ICD-10-CM | POA: Diagnosis not present

## 2022-08-02 DIAGNOSIS — R4182 Altered mental status, unspecified: Secondary | ICD-10-CM | POA: Diagnosis not present

## 2022-08-02 DIAGNOSIS — R278 Other lack of coordination: Secondary | ICD-10-CM | POA: Diagnosis not present

## 2022-08-02 DIAGNOSIS — I951 Orthostatic hypotension: Secondary | ICD-10-CM | POA: Diagnosis not present

## 2022-08-02 DIAGNOSIS — Z736 Limitation of activities due to disability: Secondary | ICD-10-CM | POA: Diagnosis not present

## 2022-08-02 DIAGNOSIS — R262 Difficulty in walking, not elsewhere classified: Secondary | ICD-10-CM | POA: Diagnosis not present

## 2022-08-02 DIAGNOSIS — Z741 Need for assistance with personal care: Secondary | ICD-10-CM | POA: Diagnosis not present

## 2022-08-02 DIAGNOSIS — G20C Parkinsonism, unspecified: Secondary | ICD-10-CM | POA: Diagnosis not present

## 2022-08-02 DIAGNOSIS — R293 Abnormal posture: Secondary | ICD-10-CM | POA: Diagnosis not present

## 2022-08-04 DIAGNOSIS — R41841 Cognitive communication deficit: Secondary | ICD-10-CM | POA: Diagnosis not present

## 2022-08-04 DIAGNOSIS — R293 Abnormal posture: Secondary | ICD-10-CM | POA: Diagnosis not present

## 2022-08-04 DIAGNOSIS — R296 Repeated falls: Secondary | ICD-10-CM | POA: Diagnosis not present

## 2022-08-04 DIAGNOSIS — R278 Other lack of coordination: Secondary | ICD-10-CM | POA: Diagnosis not present

## 2022-08-04 DIAGNOSIS — R262 Difficulty in walking, not elsewhere classified: Secondary | ICD-10-CM | POA: Diagnosis not present

## 2022-08-04 DIAGNOSIS — G20C Parkinsonism, unspecified: Secondary | ICD-10-CM | POA: Diagnosis not present

## 2022-08-04 DIAGNOSIS — M6281 Muscle weakness (generalized): Secondary | ICD-10-CM | POA: Diagnosis not present

## 2022-08-04 DIAGNOSIS — Z9181 History of falling: Secondary | ICD-10-CM | POA: Diagnosis not present

## 2022-08-04 DIAGNOSIS — Z741 Need for assistance with personal care: Secondary | ICD-10-CM | POA: Diagnosis not present

## 2022-08-04 DIAGNOSIS — R1310 Dysphagia, unspecified: Secondary | ICD-10-CM | POA: Diagnosis not present

## 2022-08-04 DIAGNOSIS — Z736 Limitation of activities due to disability: Secondary | ICD-10-CM | POA: Diagnosis not present

## 2022-08-04 DIAGNOSIS — G20A1 Parkinson's disease without dyskinesia, without mention of fluctuations: Secondary | ICD-10-CM | POA: Diagnosis not present

## 2022-08-04 DIAGNOSIS — R4182 Altered mental status, unspecified: Secondary | ICD-10-CM | POA: Diagnosis not present

## 2022-08-05 DIAGNOSIS — I10 Essential (primary) hypertension: Secondary | ICD-10-CM | POA: Diagnosis not present

## 2022-08-07 DIAGNOSIS — Z9181 History of falling: Secondary | ICD-10-CM | POA: Diagnosis not present

## 2022-08-07 DIAGNOSIS — M6281 Muscle weakness (generalized): Secondary | ICD-10-CM | POA: Diagnosis not present

## 2022-08-07 DIAGNOSIS — R296 Repeated falls: Secondary | ICD-10-CM | POA: Diagnosis not present

## 2022-08-07 DIAGNOSIS — R262 Difficulty in walking, not elsewhere classified: Secondary | ICD-10-CM | POA: Diagnosis not present

## 2022-08-07 DIAGNOSIS — R1310 Dysphagia, unspecified: Secondary | ICD-10-CM | POA: Diagnosis not present

## 2022-08-07 DIAGNOSIS — R41841 Cognitive communication deficit: Secondary | ICD-10-CM | POA: Diagnosis not present

## 2022-08-08 DIAGNOSIS — R296 Repeated falls: Secondary | ICD-10-CM | POA: Diagnosis not present

## 2022-08-08 DIAGNOSIS — R41841 Cognitive communication deficit: Secondary | ICD-10-CM | POA: Diagnosis not present

## 2022-08-08 DIAGNOSIS — R1310 Dysphagia, unspecified: Secondary | ICD-10-CM | POA: Diagnosis not present

## 2022-08-08 DIAGNOSIS — R262 Difficulty in walking, not elsewhere classified: Secondary | ICD-10-CM | POA: Diagnosis not present

## 2022-08-08 DIAGNOSIS — M6281 Muscle weakness (generalized): Secondary | ICD-10-CM | POA: Diagnosis not present

## 2022-08-08 DIAGNOSIS — Z9181 History of falling: Secondary | ICD-10-CM | POA: Diagnosis not present

## 2022-08-09 DIAGNOSIS — R1310 Dysphagia, unspecified: Secondary | ICD-10-CM | POA: Diagnosis not present

## 2022-08-09 DIAGNOSIS — R41841 Cognitive communication deficit: Secondary | ICD-10-CM | POA: Diagnosis not present

## 2022-08-09 DIAGNOSIS — R296 Repeated falls: Secondary | ICD-10-CM | POA: Diagnosis not present

## 2022-08-09 DIAGNOSIS — Z9181 History of falling: Secondary | ICD-10-CM | POA: Diagnosis not present

## 2022-08-09 DIAGNOSIS — R262 Difficulty in walking, not elsewhere classified: Secondary | ICD-10-CM | POA: Diagnosis not present

## 2022-08-09 DIAGNOSIS — M6281 Muscle weakness (generalized): Secondary | ICD-10-CM | POA: Diagnosis not present

## 2022-08-10 DIAGNOSIS — R262 Difficulty in walking, not elsewhere classified: Secondary | ICD-10-CM | POA: Diagnosis not present

## 2022-08-10 DIAGNOSIS — R296 Repeated falls: Secondary | ICD-10-CM | POA: Diagnosis not present

## 2022-08-10 DIAGNOSIS — I10 Essential (primary) hypertension: Secondary | ICD-10-CM | POA: Diagnosis not present

## 2022-08-10 DIAGNOSIS — R1310 Dysphagia, unspecified: Secondary | ICD-10-CM | POA: Diagnosis not present

## 2022-08-10 DIAGNOSIS — M6281 Muscle weakness (generalized): Secondary | ICD-10-CM | POA: Diagnosis not present

## 2022-08-10 DIAGNOSIS — R451 Restlessness and agitation: Secondary | ICD-10-CM | POA: Diagnosis not present

## 2022-08-10 DIAGNOSIS — W1830XA Fall on same level, unspecified, initial encounter: Secondary | ICD-10-CM | POA: Diagnosis not present

## 2022-08-10 DIAGNOSIS — R41841 Cognitive communication deficit: Secondary | ICD-10-CM | POA: Diagnosis not present

## 2022-08-10 DIAGNOSIS — Z9181 History of falling: Secondary | ICD-10-CM | POA: Diagnosis not present

## 2022-08-11 DIAGNOSIS — R1310 Dysphagia, unspecified: Secondary | ICD-10-CM | POA: Diagnosis not present

## 2022-08-11 DIAGNOSIS — R41841 Cognitive communication deficit: Secondary | ICD-10-CM | POA: Diagnosis not present

## 2022-08-11 DIAGNOSIS — Z9181 History of falling: Secondary | ICD-10-CM | POA: Diagnosis not present

## 2022-08-11 DIAGNOSIS — R296 Repeated falls: Secondary | ICD-10-CM | POA: Diagnosis not present

## 2022-08-11 DIAGNOSIS — M6281 Muscle weakness (generalized): Secondary | ICD-10-CM | POA: Diagnosis not present

## 2022-08-11 DIAGNOSIS — R262 Difficulty in walking, not elsewhere classified: Secondary | ICD-10-CM | POA: Diagnosis not present

## 2022-08-14 DIAGNOSIS — M6281 Muscle weakness (generalized): Secondary | ICD-10-CM | POA: Diagnosis not present

## 2022-08-14 DIAGNOSIS — R296 Repeated falls: Secondary | ICD-10-CM | POA: Diagnosis not present

## 2022-08-14 DIAGNOSIS — R1310 Dysphagia, unspecified: Secondary | ICD-10-CM | POA: Diagnosis not present

## 2022-08-14 DIAGNOSIS — R262 Difficulty in walking, not elsewhere classified: Secondary | ICD-10-CM | POA: Diagnosis not present

## 2022-08-14 DIAGNOSIS — R41841 Cognitive communication deficit: Secondary | ICD-10-CM | POA: Diagnosis not present

## 2022-08-14 DIAGNOSIS — Z9181 History of falling: Secondary | ICD-10-CM | POA: Diagnosis not present

## 2022-08-15 DIAGNOSIS — R296 Repeated falls: Secondary | ICD-10-CM | POA: Diagnosis not present

## 2022-08-15 DIAGNOSIS — M6281 Muscle weakness (generalized): Secondary | ICD-10-CM | POA: Diagnosis not present

## 2022-08-15 DIAGNOSIS — Z9181 History of falling: Secondary | ICD-10-CM | POA: Diagnosis not present

## 2022-08-15 DIAGNOSIS — R262 Difficulty in walking, not elsewhere classified: Secondary | ICD-10-CM | POA: Diagnosis not present

## 2022-08-15 DIAGNOSIS — R1310 Dysphagia, unspecified: Secondary | ICD-10-CM | POA: Diagnosis not present

## 2022-08-15 DIAGNOSIS — R41841 Cognitive communication deficit: Secondary | ICD-10-CM | POA: Diagnosis not present

## 2022-08-16 DIAGNOSIS — M6281 Muscle weakness (generalized): Secondary | ICD-10-CM | POA: Diagnosis not present

## 2022-08-16 DIAGNOSIS — R1310 Dysphagia, unspecified: Secondary | ICD-10-CM | POA: Diagnosis not present

## 2022-08-16 DIAGNOSIS — R41841 Cognitive communication deficit: Secondary | ICD-10-CM | POA: Diagnosis not present

## 2022-08-16 DIAGNOSIS — R296 Repeated falls: Secondary | ICD-10-CM | POA: Diagnosis not present

## 2022-08-16 DIAGNOSIS — Z9181 History of falling: Secondary | ICD-10-CM | POA: Diagnosis not present

## 2022-08-16 DIAGNOSIS — R262 Difficulty in walking, not elsewhere classified: Secondary | ICD-10-CM | POA: Diagnosis not present

## 2022-08-17 DIAGNOSIS — G20B1 Parkinson's disease with dyskinesia, without mention of fluctuations: Secondary | ICD-10-CM | POA: Diagnosis not present

## 2022-08-17 DIAGNOSIS — R262 Difficulty in walking, not elsewhere classified: Secondary | ICD-10-CM | POA: Diagnosis not present

## 2022-08-17 DIAGNOSIS — R1310 Dysphagia, unspecified: Secondary | ICD-10-CM | POA: Diagnosis not present

## 2022-08-17 DIAGNOSIS — R41841 Cognitive communication deficit: Secondary | ICD-10-CM | POA: Diagnosis not present

## 2022-08-17 DIAGNOSIS — M6281 Muscle weakness (generalized): Secondary | ICD-10-CM | POA: Diagnosis not present

## 2022-08-17 DIAGNOSIS — Z9181 History of falling: Secondary | ICD-10-CM | POA: Diagnosis not present

## 2022-08-17 DIAGNOSIS — R296 Repeated falls: Secondary | ICD-10-CM | POA: Diagnosis not present

## 2022-08-17 DIAGNOSIS — R451 Restlessness and agitation: Secondary | ICD-10-CM | POA: Diagnosis not present

## 2022-08-18 DIAGNOSIS — M6281 Muscle weakness (generalized): Secondary | ICD-10-CM | POA: Diagnosis not present

## 2022-08-18 DIAGNOSIS — R41841 Cognitive communication deficit: Secondary | ICD-10-CM | POA: Diagnosis not present

## 2022-08-18 DIAGNOSIS — Z9181 History of falling: Secondary | ICD-10-CM | POA: Diagnosis not present

## 2022-08-18 DIAGNOSIS — R296 Repeated falls: Secondary | ICD-10-CM | POA: Diagnosis not present

## 2022-08-18 DIAGNOSIS — R262 Difficulty in walking, not elsewhere classified: Secondary | ICD-10-CM | POA: Diagnosis not present

## 2022-08-18 DIAGNOSIS — R1310 Dysphagia, unspecified: Secondary | ICD-10-CM | POA: Diagnosis not present

## 2022-08-21 DIAGNOSIS — M6281 Muscle weakness (generalized): Secondary | ICD-10-CM | POA: Diagnosis not present

## 2022-08-21 DIAGNOSIS — Z9181 History of falling: Secondary | ICD-10-CM | POA: Diagnosis not present

## 2022-08-21 DIAGNOSIS — R262 Difficulty in walking, not elsewhere classified: Secondary | ICD-10-CM | POA: Diagnosis not present

## 2022-08-21 DIAGNOSIS — R1310 Dysphagia, unspecified: Secondary | ICD-10-CM | POA: Diagnosis not present

## 2022-08-21 DIAGNOSIS — R41841 Cognitive communication deficit: Secondary | ICD-10-CM | POA: Diagnosis not present

## 2022-08-21 DIAGNOSIS — R296 Repeated falls: Secondary | ICD-10-CM | POA: Diagnosis not present

## 2022-08-22 DIAGNOSIS — R1310 Dysphagia, unspecified: Secondary | ICD-10-CM | POA: Diagnosis not present

## 2022-08-22 DIAGNOSIS — Z9181 History of falling: Secondary | ICD-10-CM | POA: Diagnosis not present

## 2022-08-22 DIAGNOSIS — R296 Repeated falls: Secondary | ICD-10-CM | POA: Diagnosis not present

## 2022-08-22 DIAGNOSIS — R262 Difficulty in walking, not elsewhere classified: Secondary | ICD-10-CM | POA: Diagnosis not present

## 2022-08-22 DIAGNOSIS — M6281 Muscle weakness (generalized): Secondary | ICD-10-CM | POA: Diagnosis not present

## 2022-08-22 DIAGNOSIS — R41841 Cognitive communication deficit: Secondary | ICD-10-CM | POA: Diagnosis not present

## 2022-08-23 DIAGNOSIS — Z9181 History of falling: Secondary | ICD-10-CM | POA: Diagnosis not present

## 2022-08-23 DIAGNOSIS — R262 Difficulty in walking, not elsewhere classified: Secondary | ICD-10-CM | POA: Diagnosis not present

## 2022-08-23 DIAGNOSIS — M6281 Muscle weakness (generalized): Secondary | ICD-10-CM | POA: Diagnosis not present

## 2022-08-23 DIAGNOSIS — R1310 Dysphagia, unspecified: Secondary | ICD-10-CM | POA: Diagnosis not present

## 2022-08-23 DIAGNOSIS — R296 Repeated falls: Secondary | ICD-10-CM | POA: Diagnosis not present

## 2022-08-23 DIAGNOSIS — R41841 Cognitive communication deficit: Secondary | ICD-10-CM | POA: Diagnosis not present

## 2022-08-25 DIAGNOSIS — R296 Repeated falls: Secondary | ICD-10-CM | POA: Diagnosis not present

## 2022-08-25 DIAGNOSIS — M6281 Muscle weakness (generalized): Secondary | ICD-10-CM | POA: Diagnosis not present

## 2022-08-25 DIAGNOSIS — R262 Difficulty in walking, not elsewhere classified: Secondary | ICD-10-CM | POA: Diagnosis not present

## 2022-08-25 DIAGNOSIS — R41841 Cognitive communication deficit: Secondary | ICD-10-CM | POA: Diagnosis not present

## 2022-08-25 DIAGNOSIS — R1310 Dysphagia, unspecified: Secondary | ICD-10-CM | POA: Diagnosis not present

## 2022-08-25 DIAGNOSIS — Z9181 History of falling: Secondary | ICD-10-CM | POA: Diagnosis not present

## 2022-08-28 DIAGNOSIS — R296 Repeated falls: Secondary | ICD-10-CM | POA: Diagnosis not present

## 2022-08-28 DIAGNOSIS — R41841 Cognitive communication deficit: Secondary | ICD-10-CM | POA: Diagnosis not present

## 2022-08-28 DIAGNOSIS — Z9181 History of falling: Secondary | ICD-10-CM | POA: Diagnosis not present

## 2022-08-28 DIAGNOSIS — R262 Difficulty in walking, not elsewhere classified: Secondary | ICD-10-CM | POA: Diagnosis not present

## 2022-08-28 DIAGNOSIS — M6281 Muscle weakness (generalized): Secondary | ICD-10-CM | POA: Diagnosis not present

## 2022-08-28 DIAGNOSIS — R1310 Dysphagia, unspecified: Secondary | ICD-10-CM | POA: Diagnosis not present

## 2022-08-29 DIAGNOSIS — R262 Difficulty in walking, not elsewhere classified: Secondary | ICD-10-CM | POA: Diagnosis not present

## 2022-08-29 DIAGNOSIS — M6281 Muscle weakness (generalized): Secondary | ICD-10-CM | POA: Diagnosis not present

## 2022-08-29 DIAGNOSIS — Z9181 History of falling: Secondary | ICD-10-CM | POA: Diagnosis not present

## 2022-08-29 DIAGNOSIS — R41841 Cognitive communication deficit: Secondary | ICD-10-CM | POA: Diagnosis not present

## 2022-08-29 DIAGNOSIS — R1310 Dysphagia, unspecified: Secondary | ICD-10-CM | POA: Diagnosis not present

## 2022-08-29 DIAGNOSIS — R296 Repeated falls: Secondary | ICD-10-CM | POA: Diagnosis not present

## 2022-08-30 DIAGNOSIS — R262 Difficulty in walking, not elsewhere classified: Secondary | ICD-10-CM | POA: Diagnosis not present

## 2022-08-30 DIAGNOSIS — R296 Repeated falls: Secondary | ICD-10-CM | POA: Diagnosis not present

## 2022-08-30 DIAGNOSIS — Z9181 History of falling: Secondary | ICD-10-CM | POA: Diagnosis not present

## 2022-08-30 DIAGNOSIS — R41841 Cognitive communication deficit: Secondary | ICD-10-CM | POA: Diagnosis not present

## 2022-08-30 DIAGNOSIS — R1310 Dysphagia, unspecified: Secondary | ICD-10-CM | POA: Diagnosis not present

## 2022-08-30 DIAGNOSIS — M6281 Muscle weakness (generalized): Secondary | ICD-10-CM | POA: Diagnosis not present

## 2022-09-04 DIAGNOSIS — Z736 Limitation of activities due to disability: Secondary | ICD-10-CM | POA: Diagnosis not present

## 2022-09-04 DIAGNOSIS — R41841 Cognitive communication deficit: Secondary | ICD-10-CM | POA: Diagnosis not present

## 2022-09-04 DIAGNOSIS — R296 Repeated falls: Secondary | ICD-10-CM | POA: Diagnosis not present

## 2022-09-04 DIAGNOSIS — R293 Abnormal posture: Secondary | ICD-10-CM | POA: Diagnosis not present

## 2022-09-04 DIAGNOSIS — R1310 Dysphagia, unspecified: Secondary | ICD-10-CM | POA: Diagnosis not present

## 2022-09-04 DIAGNOSIS — R278 Other lack of coordination: Secondary | ICD-10-CM | POA: Diagnosis not present

## 2022-09-04 DIAGNOSIS — R4182 Altered mental status, unspecified: Secondary | ICD-10-CM | POA: Diagnosis not present

## 2022-09-04 DIAGNOSIS — R262 Difficulty in walking, not elsewhere classified: Secondary | ICD-10-CM | POA: Diagnosis not present

## 2022-09-04 DIAGNOSIS — Z9181 History of falling: Secondary | ICD-10-CM | POA: Diagnosis not present

## 2022-09-04 DIAGNOSIS — M6281 Muscle weakness (generalized): Secondary | ICD-10-CM | POA: Diagnosis not present

## 2022-09-04 DIAGNOSIS — G20A1 Parkinson's disease without dyskinesia, without mention of fluctuations: Secondary | ICD-10-CM | POA: Diagnosis not present

## 2022-09-04 DIAGNOSIS — G20C Parkinsonism, unspecified: Secondary | ICD-10-CM | POA: Diagnosis not present

## 2022-09-04 DIAGNOSIS — Z741 Need for assistance with personal care: Secondary | ICD-10-CM | POA: Diagnosis not present

## 2022-09-05 DIAGNOSIS — M6281 Muscle weakness (generalized): Secondary | ICD-10-CM | POA: Diagnosis not present

## 2022-09-05 DIAGNOSIS — Z9181 History of falling: Secondary | ICD-10-CM | POA: Diagnosis not present

## 2022-09-05 DIAGNOSIS — R1310 Dysphagia, unspecified: Secondary | ICD-10-CM | POA: Diagnosis not present

## 2022-09-05 DIAGNOSIS — R262 Difficulty in walking, not elsewhere classified: Secondary | ICD-10-CM | POA: Diagnosis not present

## 2022-09-05 DIAGNOSIS — R41841 Cognitive communication deficit: Secondary | ICD-10-CM | POA: Diagnosis not present

## 2022-09-05 DIAGNOSIS — R296 Repeated falls: Secondary | ICD-10-CM | POA: Diagnosis not present

## 2022-09-06 DIAGNOSIS — M6281 Muscle weakness (generalized): Secondary | ICD-10-CM | POA: Diagnosis not present

## 2022-09-06 DIAGNOSIS — R1310 Dysphagia, unspecified: Secondary | ICD-10-CM | POA: Diagnosis not present

## 2022-09-06 DIAGNOSIS — R41841 Cognitive communication deficit: Secondary | ICD-10-CM | POA: Diagnosis not present

## 2022-09-06 DIAGNOSIS — R296 Repeated falls: Secondary | ICD-10-CM | POA: Diagnosis not present

## 2022-09-06 DIAGNOSIS — Z9181 History of falling: Secondary | ICD-10-CM | POA: Diagnosis not present

## 2022-09-06 DIAGNOSIS — R262 Difficulty in walking, not elsewhere classified: Secondary | ICD-10-CM | POA: Diagnosis not present

## 2022-09-07 ENCOUNTER — Telehealth: Payer: Self-pay | Admitting: Family Medicine

## 2022-09-07 DIAGNOSIS — R262 Difficulty in walking, not elsewhere classified: Secondary | ICD-10-CM | POA: Diagnosis not present

## 2022-09-07 DIAGNOSIS — M6281 Muscle weakness (generalized): Secondary | ICD-10-CM | POA: Diagnosis not present

## 2022-09-07 DIAGNOSIS — R1310 Dysphagia, unspecified: Secondary | ICD-10-CM | POA: Diagnosis not present

## 2022-09-07 DIAGNOSIS — R296 Repeated falls: Secondary | ICD-10-CM | POA: Diagnosis not present

## 2022-09-07 DIAGNOSIS — Z9181 History of falling: Secondary | ICD-10-CM | POA: Diagnosis not present

## 2022-09-07 DIAGNOSIS — R41841 Cognitive communication deficit: Secondary | ICD-10-CM | POA: Diagnosis not present

## 2022-09-07 NOTE — Telephone Encounter (Signed)
Contacted [facility] to schedule their annual wellness visit. Patient declined to schedule AWV at this time. Transferred care to [facility].   [place] Direct Dial: [phone]

## 2022-09-08 DIAGNOSIS — F03B2 Unspecified dementia, moderate, with psychotic disturbance: Secondary | ICD-10-CM | POA: Diagnosis not present

## 2022-09-11 DIAGNOSIS — R296 Repeated falls: Secondary | ICD-10-CM | POA: Diagnosis not present

## 2022-09-11 DIAGNOSIS — Z9181 History of falling: Secondary | ICD-10-CM | POA: Diagnosis not present

## 2022-09-11 DIAGNOSIS — R1310 Dysphagia, unspecified: Secondary | ICD-10-CM | POA: Diagnosis not present

## 2022-09-11 DIAGNOSIS — M6281 Muscle weakness (generalized): Secondary | ICD-10-CM | POA: Diagnosis not present

## 2022-09-11 DIAGNOSIS — R41841 Cognitive communication deficit: Secondary | ICD-10-CM | POA: Diagnosis not present

## 2022-09-11 DIAGNOSIS — R262 Difficulty in walking, not elsewhere classified: Secondary | ICD-10-CM | POA: Diagnosis not present

## 2022-09-12 DIAGNOSIS — R1310 Dysphagia, unspecified: Secondary | ICD-10-CM | POA: Diagnosis not present

## 2022-09-12 DIAGNOSIS — Z9181 History of falling: Secondary | ICD-10-CM | POA: Diagnosis not present

## 2022-09-12 DIAGNOSIS — R296 Repeated falls: Secondary | ICD-10-CM | POA: Diagnosis not present

## 2022-09-12 DIAGNOSIS — M6281 Muscle weakness (generalized): Secondary | ICD-10-CM | POA: Diagnosis not present

## 2022-09-12 DIAGNOSIS — R41841 Cognitive communication deficit: Secondary | ICD-10-CM | POA: Diagnosis not present

## 2022-09-12 DIAGNOSIS — R262 Difficulty in walking, not elsewhere classified: Secondary | ICD-10-CM | POA: Diagnosis not present

## 2022-09-13 DIAGNOSIS — M6281 Muscle weakness (generalized): Secondary | ICD-10-CM | POA: Diagnosis not present

## 2022-09-13 DIAGNOSIS — R262 Difficulty in walking, not elsewhere classified: Secondary | ICD-10-CM | POA: Diagnosis not present

## 2022-09-13 DIAGNOSIS — R296 Repeated falls: Secondary | ICD-10-CM | POA: Diagnosis not present

## 2022-09-13 DIAGNOSIS — R41841 Cognitive communication deficit: Secondary | ICD-10-CM | POA: Diagnosis not present

## 2022-09-13 DIAGNOSIS — Z9181 History of falling: Secondary | ICD-10-CM | POA: Diagnosis not present

## 2022-09-13 DIAGNOSIS — R1310 Dysphagia, unspecified: Secondary | ICD-10-CM | POA: Diagnosis not present

## 2022-09-14 DIAGNOSIS — R262 Difficulty in walking, not elsewhere classified: Secondary | ICD-10-CM | POA: Diagnosis not present

## 2022-09-14 DIAGNOSIS — R1310 Dysphagia, unspecified: Secondary | ICD-10-CM | POA: Diagnosis not present

## 2022-09-14 DIAGNOSIS — M6281 Muscle weakness (generalized): Secondary | ICD-10-CM | POA: Diagnosis not present

## 2022-09-14 DIAGNOSIS — R41841 Cognitive communication deficit: Secondary | ICD-10-CM | POA: Diagnosis not present

## 2022-09-14 DIAGNOSIS — R296 Repeated falls: Secondary | ICD-10-CM | POA: Diagnosis not present

## 2022-09-14 DIAGNOSIS — Z9181 History of falling: Secondary | ICD-10-CM | POA: Diagnosis not present

## 2022-09-18 DIAGNOSIS — M6281 Muscle weakness (generalized): Secondary | ICD-10-CM | POA: Diagnosis not present

## 2022-09-18 DIAGNOSIS — R41841 Cognitive communication deficit: Secondary | ICD-10-CM | POA: Diagnosis not present

## 2022-09-18 DIAGNOSIS — R1310 Dysphagia, unspecified: Secondary | ICD-10-CM | POA: Diagnosis not present

## 2022-09-18 DIAGNOSIS — R262 Difficulty in walking, not elsewhere classified: Secondary | ICD-10-CM | POA: Diagnosis not present

## 2022-09-18 DIAGNOSIS — R296 Repeated falls: Secondary | ICD-10-CM | POA: Diagnosis not present

## 2022-09-18 DIAGNOSIS — Z9181 History of falling: Secondary | ICD-10-CM | POA: Diagnosis not present

## 2022-09-19 DIAGNOSIS — R1310 Dysphagia, unspecified: Secondary | ICD-10-CM | POA: Diagnosis not present

## 2022-09-19 DIAGNOSIS — R262 Difficulty in walking, not elsewhere classified: Secondary | ICD-10-CM | POA: Diagnosis not present

## 2022-09-19 DIAGNOSIS — R41841 Cognitive communication deficit: Secondary | ICD-10-CM | POA: Diagnosis not present

## 2022-09-19 DIAGNOSIS — Z9181 History of falling: Secondary | ICD-10-CM | POA: Diagnosis not present

## 2022-09-19 DIAGNOSIS — M6281 Muscle weakness (generalized): Secondary | ICD-10-CM | POA: Diagnosis not present

## 2022-09-19 DIAGNOSIS — R296 Repeated falls: Secondary | ICD-10-CM | POA: Diagnosis not present

## 2022-09-20 DIAGNOSIS — R262 Difficulty in walking, not elsewhere classified: Secondary | ICD-10-CM | POA: Diagnosis not present

## 2022-09-20 DIAGNOSIS — M6281 Muscle weakness (generalized): Secondary | ICD-10-CM | POA: Diagnosis not present

## 2022-09-20 DIAGNOSIS — R41841 Cognitive communication deficit: Secondary | ICD-10-CM | POA: Diagnosis not present

## 2022-09-20 DIAGNOSIS — R1310 Dysphagia, unspecified: Secondary | ICD-10-CM | POA: Diagnosis not present

## 2022-09-20 DIAGNOSIS — R296 Repeated falls: Secondary | ICD-10-CM | POA: Diagnosis not present

## 2022-09-20 DIAGNOSIS — Z9181 History of falling: Secondary | ICD-10-CM | POA: Diagnosis not present

## 2022-09-21 DIAGNOSIS — R1310 Dysphagia, unspecified: Secondary | ICD-10-CM | POA: Diagnosis not present

## 2022-09-21 DIAGNOSIS — I10 Essential (primary) hypertension: Secondary | ICD-10-CM | POA: Diagnosis not present

## 2022-09-21 DIAGNOSIS — G20B1 Parkinson's disease with dyskinesia, without mention of fluctuations: Secondary | ICD-10-CM | POA: Diagnosis not present

## 2022-09-21 DIAGNOSIS — Z9181 History of falling: Secondary | ICD-10-CM | POA: Diagnosis not present

## 2022-09-21 DIAGNOSIS — R451 Restlessness and agitation: Secondary | ICD-10-CM | POA: Diagnosis not present

## 2022-09-21 DIAGNOSIS — M6281 Muscle weakness (generalized): Secondary | ICD-10-CM | POA: Diagnosis not present

## 2022-09-21 DIAGNOSIS — R262 Difficulty in walking, not elsewhere classified: Secondary | ICD-10-CM | POA: Diagnosis not present

## 2022-09-21 DIAGNOSIS — G4709 Other insomnia: Secondary | ICD-10-CM | POA: Diagnosis not present

## 2022-09-21 DIAGNOSIS — R41841 Cognitive communication deficit: Secondary | ICD-10-CM | POA: Diagnosis not present

## 2022-09-21 DIAGNOSIS — R296 Repeated falls: Secondary | ICD-10-CM | POA: Diagnosis not present

## 2022-09-25 DIAGNOSIS — R296 Repeated falls: Secondary | ICD-10-CM | POA: Diagnosis not present

## 2022-09-25 DIAGNOSIS — Z9181 History of falling: Secondary | ICD-10-CM | POA: Diagnosis not present

## 2022-09-25 DIAGNOSIS — M6281 Muscle weakness (generalized): Secondary | ICD-10-CM | POA: Diagnosis not present

## 2022-09-25 DIAGNOSIS — R41841 Cognitive communication deficit: Secondary | ICD-10-CM | POA: Diagnosis not present

## 2022-09-25 DIAGNOSIS — R262 Difficulty in walking, not elsewhere classified: Secondary | ICD-10-CM | POA: Diagnosis not present

## 2022-09-25 DIAGNOSIS — R1310 Dysphagia, unspecified: Secondary | ICD-10-CM | POA: Diagnosis not present

## 2022-09-27 DIAGNOSIS — Z9181 History of falling: Secondary | ICD-10-CM | POA: Diagnosis not present

## 2022-09-27 DIAGNOSIS — M6281 Muscle weakness (generalized): Secondary | ICD-10-CM | POA: Diagnosis not present

## 2022-09-27 DIAGNOSIS — R262 Difficulty in walking, not elsewhere classified: Secondary | ICD-10-CM | POA: Diagnosis not present

## 2022-09-27 DIAGNOSIS — R1310 Dysphagia, unspecified: Secondary | ICD-10-CM | POA: Diagnosis not present

## 2022-09-27 DIAGNOSIS — R41841 Cognitive communication deficit: Secondary | ICD-10-CM | POA: Diagnosis not present

## 2022-09-27 DIAGNOSIS — R296 Repeated falls: Secondary | ICD-10-CM | POA: Diagnosis not present

## 2022-09-28 DIAGNOSIS — R296 Repeated falls: Secondary | ICD-10-CM | POA: Diagnosis not present

## 2022-09-28 DIAGNOSIS — Z9181 History of falling: Secondary | ICD-10-CM | POA: Diagnosis not present

## 2022-09-28 DIAGNOSIS — R1310 Dysphagia, unspecified: Secondary | ICD-10-CM | POA: Diagnosis not present

## 2022-09-28 DIAGNOSIS — R262 Difficulty in walking, not elsewhere classified: Secondary | ICD-10-CM | POA: Diagnosis not present

## 2022-09-28 DIAGNOSIS — R41841 Cognitive communication deficit: Secondary | ICD-10-CM | POA: Diagnosis not present

## 2022-09-28 DIAGNOSIS — M6281 Muscle weakness (generalized): Secondary | ICD-10-CM | POA: Diagnosis not present

## 2022-10-02 DIAGNOSIS — R262 Difficulty in walking, not elsewhere classified: Secondary | ICD-10-CM | POA: Diagnosis not present

## 2022-10-02 DIAGNOSIS — M6281 Muscle weakness (generalized): Secondary | ICD-10-CM | POA: Diagnosis not present

## 2022-10-02 DIAGNOSIS — R41841 Cognitive communication deficit: Secondary | ICD-10-CM | POA: Diagnosis not present

## 2022-10-02 DIAGNOSIS — R1310 Dysphagia, unspecified: Secondary | ICD-10-CM | POA: Diagnosis not present

## 2022-10-02 DIAGNOSIS — Z9181 History of falling: Secondary | ICD-10-CM | POA: Diagnosis not present

## 2022-10-02 DIAGNOSIS — R296 Repeated falls: Secondary | ICD-10-CM | POA: Diagnosis not present

## 2022-10-03 DIAGNOSIS — R296 Repeated falls: Secondary | ICD-10-CM | POA: Diagnosis not present

## 2022-10-03 DIAGNOSIS — Z9181 History of falling: Secondary | ICD-10-CM | POA: Diagnosis not present

## 2022-10-03 DIAGNOSIS — R1310 Dysphagia, unspecified: Secondary | ICD-10-CM | POA: Diagnosis not present

## 2022-10-03 DIAGNOSIS — R41841 Cognitive communication deficit: Secondary | ICD-10-CM | POA: Diagnosis not present

## 2022-10-03 DIAGNOSIS — M6281 Muscle weakness (generalized): Secondary | ICD-10-CM | POA: Diagnosis not present

## 2022-10-03 DIAGNOSIS — R262 Difficulty in walking, not elsewhere classified: Secondary | ICD-10-CM | POA: Diagnosis not present

## 2022-10-04 DIAGNOSIS — G20A1 Parkinson's disease without dyskinesia, without mention of fluctuations: Secondary | ICD-10-CM | POA: Diagnosis not present

## 2022-10-04 DIAGNOSIS — R1310 Dysphagia, unspecified: Secondary | ICD-10-CM | POA: Diagnosis not present

## 2022-10-04 DIAGNOSIS — M6281 Muscle weakness (generalized): Secondary | ICD-10-CM | POA: Diagnosis not present

## 2022-10-04 DIAGNOSIS — Z741 Need for assistance with personal care: Secondary | ICD-10-CM | POA: Diagnosis not present

## 2022-10-04 DIAGNOSIS — G20C Parkinsonism, unspecified: Secondary | ICD-10-CM | POA: Diagnosis not present

## 2022-10-04 DIAGNOSIS — Z9181 History of falling: Secondary | ICD-10-CM | POA: Diagnosis not present

## 2022-10-04 DIAGNOSIS — R4182 Altered mental status, unspecified: Secondary | ICD-10-CM | POA: Diagnosis not present

## 2022-10-04 DIAGNOSIS — Z736 Limitation of activities due to disability: Secondary | ICD-10-CM | POA: Diagnosis not present

## 2022-10-04 DIAGNOSIS — R41841 Cognitive communication deficit: Secondary | ICD-10-CM | POA: Diagnosis not present

## 2022-10-04 DIAGNOSIS — R278 Other lack of coordination: Secondary | ICD-10-CM | POA: Diagnosis not present

## 2022-10-04 DIAGNOSIS — R296 Repeated falls: Secondary | ICD-10-CM | POA: Diagnosis not present

## 2022-10-04 DIAGNOSIS — R293 Abnormal posture: Secondary | ICD-10-CM | POA: Diagnosis not present

## 2022-10-04 DIAGNOSIS — R262 Difficulty in walking, not elsewhere classified: Secondary | ICD-10-CM | POA: Diagnosis not present

## 2022-10-05 DIAGNOSIS — Z9181 History of falling: Secondary | ICD-10-CM | POA: Diagnosis not present

## 2022-10-05 DIAGNOSIS — M6281 Muscle weakness (generalized): Secondary | ICD-10-CM | POA: Diagnosis not present

## 2022-10-05 DIAGNOSIS — R1310 Dysphagia, unspecified: Secondary | ICD-10-CM | POA: Diagnosis not present

## 2022-10-05 DIAGNOSIS — R296 Repeated falls: Secondary | ICD-10-CM | POA: Diagnosis not present

## 2022-10-05 DIAGNOSIS — R41841 Cognitive communication deficit: Secondary | ICD-10-CM | POA: Diagnosis not present

## 2022-10-05 DIAGNOSIS — R262 Difficulty in walking, not elsewhere classified: Secondary | ICD-10-CM | POA: Diagnosis not present

## 2022-10-06 DIAGNOSIS — R1310 Dysphagia, unspecified: Secondary | ICD-10-CM | POA: Diagnosis not present

## 2022-10-06 DIAGNOSIS — R262 Difficulty in walking, not elsewhere classified: Secondary | ICD-10-CM | POA: Diagnosis not present

## 2022-10-06 DIAGNOSIS — R41841 Cognitive communication deficit: Secondary | ICD-10-CM | POA: Diagnosis not present

## 2022-10-06 DIAGNOSIS — R296 Repeated falls: Secondary | ICD-10-CM | POA: Diagnosis not present

## 2022-10-06 DIAGNOSIS — Z9181 History of falling: Secondary | ICD-10-CM | POA: Diagnosis not present

## 2022-10-06 DIAGNOSIS — M6281 Muscle weakness (generalized): Secondary | ICD-10-CM | POA: Diagnosis not present

## 2022-10-10 ENCOUNTER — Ambulatory Visit: Payer: Medicare Other | Admitting: Dermatology

## 2022-10-10 DIAGNOSIS — R41841 Cognitive communication deficit: Secondary | ICD-10-CM | POA: Diagnosis not present

## 2022-10-10 DIAGNOSIS — Z9181 History of falling: Secondary | ICD-10-CM | POA: Diagnosis not present

## 2022-10-10 DIAGNOSIS — R262 Difficulty in walking, not elsewhere classified: Secondary | ICD-10-CM | POA: Diagnosis not present

## 2022-10-10 DIAGNOSIS — R1310 Dysphagia, unspecified: Secondary | ICD-10-CM | POA: Diagnosis not present

## 2022-10-10 DIAGNOSIS — M6281 Muscle weakness (generalized): Secondary | ICD-10-CM | POA: Diagnosis not present

## 2022-10-10 DIAGNOSIS — R296 Repeated falls: Secondary | ICD-10-CM | POA: Diagnosis not present

## 2022-10-11 DIAGNOSIS — R296 Repeated falls: Secondary | ICD-10-CM | POA: Diagnosis not present

## 2022-10-11 DIAGNOSIS — Z9181 History of falling: Secondary | ICD-10-CM | POA: Diagnosis not present

## 2022-10-11 DIAGNOSIS — M6281 Muscle weakness (generalized): Secondary | ICD-10-CM | POA: Diagnosis not present

## 2022-10-11 DIAGNOSIS — R262 Difficulty in walking, not elsewhere classified: Secondary | ICD-10-CM | POA: Diagnosis not present

## 2022-10-11 DIAGNOSIS — R1310 Dysphagia, unspecified: Secondary | ICD-10-CM | POA: Diagnosis not present

## 2022-10-11 DIAGNOSIS — R41841 Cognitive communication deficit: Secondary | ICD-10-CM | POA: Diagnosis not present

## 2022-10-12 DIAGNOSIS — R296 Repeated falls: Secondary | ICD-10-CM | POA: Diagnosis not present

## 2022-10-12 DIAGNOSIS — R1310 Dysphagia, unspecified: Secondary | ICD-10-CM | POA: Diagnosis not present

## 2022-10-12 DIAGNOSIS — R41841 Cognitive communication deficit: Secondary | ICD-10-CM | POA: Diagnosis not present

## 2022-10-12 DIAGNOSIS — W1830XA Fall on same level, unspecified, initial encounter: Secondary | ICD-10-CM | POA: Diagnosis not present

## 2022-10-12 DIAGNOSIS — G20B1 Parkinson's disease with dyskinesia, without mention of fluctuations: Secondary | ICD-10-CM | POA: Diagnosis not present

## 2022-10-12 DIAGNOSIS — Z9181 History of falling: Secondary | ICD-10-CM | POA: Diagnosis not present

## 2022-10-12 DIAGNOSIS — G4709 Other insomnia: Secondary | ICD-10-CM | POA: Diagnosis not present

## 2022-10-12 DIAGNOSIS — I10 Essential (primary) hypertension: Secondary | ICD-10-CM | POA: Diagnosis not present

## 2022-10-12 DIAGNOSIS — M6281 Muscle weakness (generalized): Secondary | ICD-10-CM | POA: Diagnosis not present

## 2022-10-12 DIAGNOSIS — G9009 Other idiopathic peripheral autonomic neuropathy: Secondary | ICD-10-CM | POA: Diagnosis not present

## 2022-10-12 DIAGNOSIS — R451 Restlessness and agitation: Secondary | ICD-10-CM | POA: Diagnosis not present

## 2022-10-12 DIAGNOSIS — R262 Difficulty in walking, not elsewhere classified: Secondary | ICD-10-CM | POA: Diagnosis not present

## 2022-10-16 DIAGNOSIS — R1310 Dysphagia, unspecified: Secondary | ICD-10-CM | POA: Diagnosis not present

## 2022-10-16 DIAGNOSIS — R262 Difficulty in walking, not elsewhere classified: Secondary | ICD-10-CM | POA: Diagnosis not present

## 2022-10-16 DIAGNOSIS — M6281 Muscle weakness (generalized): Secondary | ICD-10-CM | POA: Diagnosis not present

## 2022-10-16 DIAGNOSIS — Z9181 History of falling: Secondary | ICD-10-CM | POA: Diagnosis not present

## 2022-10-16 DIAGNOSIS — R41841 Cognitive communication deficit: Secondary | ICD-10-CM | POA: Diagnosis not present

## 2022-10-16 DIAGNOSIS — R296 Repeated falls: Secondary | ICD-10-CM | POA: Diagnosis not present

## 2022-10-17 DIAGNOSIS — M6281 Muscle weakness (generalized): Secondary | ICD-10-CM | POA: Diagnosis not present

## 2022-10-17 DIAGNOSIS — Z9181 History of falling: Secondary | ICD-10-CM | POA: Diagnosis not present

## 2022-10-17 DIAGNOSIS — R41841 Cognitive communication deficit: Secondary | ICD-10-CM | POA: Diagnosis not present

## 2022-10-17 DIAGNOSIS — R1310 Dysphagia, unspecified: Secondary | ICD-10-CM | POA: Diagnosis not present

## 2022-10-17 DIAGNOSIS — R296 Repeated falls: Secondary | ICD-10-CM | POA: Diagnosis not present

## 2022-10-17 DIAGNOSIS — R262 Difficulty in walking, not elsewhere classified: Secondary | ICD-10-CM | POA: Diagnosis not present

## 2022-10-18 DIAGNOSIS — R1310 Dysphagia, unspecified: Secondary | ICD-10-CM | POA: Diagnosis not present

## 2022-10-18 DIAGNOSIS — Z9181 History of falling: Secondary | ICD-10-CM | POA: Diagnosis not present

## 2022-10-18 DIAGNOSIS — R262 Difficulty in walking, not elsewhere classified: Secondary | ICD-10-CM | POA: Diagnosis not present

## 2022-10-18 DIAGNOSIS — R296 Repeated falls: Secondary | ICD-10-CM | POA: Diagnosis not present

## 2022-10-18 DIAGNOSIS — R41841 Cognitive communication deficit: Secondary | ICD-10-CM | POA: Diagnosis not present

## 2022-10-18 DIAGNOSIS — M6281 Muscle weakness (generalized): Secondary | ICD-10-CM | POA: Diagnosis not present

## 2022-10-19 DIAGNOSIS — Z9181 History of falling: Secondary | ICD-10-CM | POA: Diagnosis not present

## 2022-10-19 DIAGNOSIS — M6281 Muscle weakness (generalized): Secondary | ICD-10-CM | POA: Diagnosis not present

## 2022-10-19 DIAGNOSIS — R41841 Cognitive communication deficit: Secondary | ICD-10-CM | POA: Diagnosis not present

## 2022-10-19 DIAGNOSIS — R262 Difficulty in walking, not elsewhere classified: Secondary | ICD-10-CM | POA: Diagnosis not present

## 2022-10-19 DIAGNOSIS — R296 Repeated falls: Secondary | ICD-10-CM | POA: Diagnosis not present

## 2022-10-19 DIAGNOSIS — R1310 Dysphagia, unspecified: Secondary | ICD-10-CM | POA: Diagnosis not present

## 2022-10-20 DIAGNOSIS — R296 Repeated falls: Secondary | ICD-10-CM | POA: Diagnosis not present

## 2022-10-20 DIAGNOSIS — R1310 Dysphagia, unspecified: Secondary | ICD-10-CM | POA: Diagnosis not present

## 2022-10-20 DIAGNOSIS — R262 Difficulty in walking, not elsewhere classified: Secondary | ICD-10-CM | POA: Diagnosis not present

## 2022-10-20 DIAGNOSIS — Z9181 History of falling: Secondary | ICD-10-CM | POA: Diagnosis not present

## 2022-10-20 DIAGNOSIS — M6281 Muscle weakness (generalized): Secondary | ICD-10-CM | POA: Diagnosis not present

## 2022-10-20 DIAGNOSIS — R41841 Cognitive communication deficit: Secondary | ICD-10-CM | POA: Diagnosis not present

## 2022-10-24 DIAGNOSIS — R296 Repeated falls: Secondary | ICD-10-CM | POA: Diagnosis not present

## 2022-10-24 DIAGNOSIS — R41841 Cognitive communication deficit: Secondary | ICD-10-CM | POA: Diagnosis not present

## 2022-10-24 DIAGNOSIS — R1310 Dysphagia, unspecified: Secondary | ICD-10-CM | POA: Diagnosis not present

## 2022-10-24 DIAGNOSIS — M6281 Muscle weakness (generalized): Secondary | ICD-10-CM | POA: Diagnosis not present

## 2022-10-24 DIAGNOSIS — Z9181 History of falling: Secondary | ICD-10-CM | POA: Diagnosis not present

## 2022-10-24 DIAGNOSIS — R262 Difficulty in walking, not elsewhere classified: Secondary | ICD-10-CM | POA: Diagnosis not present

## 2022-10-26 DIAGNOSIS — J300 Vasomotor rhinitis: Secondary | ICD-10-CM | POA: Diagnosis not present

## 2022-10-26 DIAGNOSIS — R296 Repeated falls: Secondary | ICD-10-CM | POA: Diagnosis not present

## 2022-10-26 DIAGNOSIS — M6281 Muscle weakness (generalized): Secondary | ICD-10-CM | POA: Diagnosis not present

## 2022-10-26 DIAGNOSIS — R41841 Cognitive communication deficit: Secondary | ICD-10-CM | POA: Diagnosis not present

## 2022-10-26 DIAGNOSIS — R262 Difficulty in walking, not elsewhere classified: Secondary | ICD-10-CM | POA: Diagnosis not present

## 2022-10-26 DIAGNOSIS — G20B1 Parkinson's disease with dyskinesia, without mention of fluctuations: Secondary | ICD-10-CM | POA: Diagnosis not present

## 2022-10-26 DIAGNOSIS — Z9181 History of falling: Secondary | ICD-10-CM | POA: Diagnosis not present

## 2022-10-26 DIAGNOSIS — I10 Essential (primary) hypertension: Secondary | ICD-10-CM | POA: Diagnosis not present

## 2022-10-26 DIAGNOSIS — S41101D Unspecified open wound of right upper arm, subsequent encounter: Secondary | ICD-10-CM | POA: Diagnosis not present

## 2022-10-26 DIAGNOSIS — R1310 Dysphagia, unspecified: Secondary | ICD-10-CM | POA: Diagnosis not present

## 2022-10-27 DIAGNOSIS — R262 Difficulty in walking, not elsewhere classified: Secondary | ICD-10-CM | POA: Diagnosis not present

## 2022-10-27 DIAGNOSIS — M6281 Muscle weakness (generalized): Secondary | ICD-10-CM | POA: Diagnosis not present

## 2022-10-27 DIAGNOSIS — Z9181 History of falling: Secondary | ICD-10-CM | POA: Diagnosis not present

## 2022-10-27 DIAGNOSIS — R1310 Dysphagia, unspecified: Secondary | ICD-10-CM | POA: Diagnosis not present

## 2022-10-27 DIAGNOSIS — R296 Repeated falls: Secondary | ICD-10-CM | POA: Diagnosis not present

## 2022-10-27 DIAGNOSIS — R41841 Cognitive communication deficit: Secondary | ICD-10-CM | POA: Diagnosis not present

## 2022-10-30 DIAGNOSIS — M6281 Muscle weakness (generalized): Secondary | ICD-10-CM | POA: Diagnosis not present

## 2022-10-30 DIAGNOSIS — R1310 Dysphagia, unspecified: Secondary | ICD-10-CM | POA: Diagnosis not present

## 2022-10-30 DIAGNOSIS — R296 Repeated falls: Secondary | ICD-10-CM | POA: Diagnosis not present

## 2022-10-30 DIAGNOSIS — Z9181 History of falling: Secondary | ICD-10-CM | POA: Diagnosis not present

## 2022-10-30 DIAGNOSIS — R41841 Cognitive communication deficit: Secondary | ICD-10-CM | POA: Diagnosis not present

## 2022-10-30 DIAGNOSIS — R262 Difficulty in walking, not elsewhere classified: Secondary | ICD-10-CM | POA: Diagnosis not present

## 2022-10-31 DIAGNOSIS — Z9181 History of falling: Secondary | ICD-10-CM | POA: Diagnosis not present

## 2022-10-31 DIAGNOSIS — R1310 Dysphagia, unspecified: Secondary | ICD-10-CM | POA: Diagnosis not present

## 2022-10-31 DIAGNOSIS — E782 Mixed hyperlipidemia: Secondary | ICD-10-CM | POA: Diagnosis not present

## 2022-10-31 DIAGNOSIS — M6281 Muscle weakness (generalized): Secondary | ICD-10-CM | POA: Diagnosis not present

## 2022-10-31 DIAGNOSIS — R41841 Cognitive communication deficit: Secondary | ICD-10-CM | POA: Diagnosis not present

## 2022-10-31 DIAGNOSIS — I10 Essential (primary) hypertension: Secondary | ICD-10-CM | POA: Diagnosis not present

## 2022-10-31 DIAGNOSIS — R262 Difficulty in walking, not elsewhere classified: Secondary | ICD-10-CM | POA: Diagnosis not present

## 2022-10-31 DIAGNOSIS — R296 Repeated falls: Secondary | ICD-10-CM | POA: Diagnosis not present

## 2022-11-01 DIAGNOSIS — R262 Difficulty in walking, not elsewhere classified: Secondary | ICD-10-CM | POA: Diagnosis not present

## 2022-11-01 DIAGNOSIS — G9389 Other specified disorders of brain: Secondary | ICD-10-CM | POA: Diagnosis not present

## 2022-11-01 DIAGNOSIS — R296 Repeated falls: Secondary | ICD-10-CM | POA: Diagnosis not present

## 2022-11-01 DIAGNOSIS — R531 Weakness: Secondary | ICD-10-CM | POA: Diagnosis not present

## 2022-11-01 DIAGNOSIS — M6281 Muscle weakness (generalized): Secondary | ICD-10-CM | POA: Diagnosis not present

## 2022-11-01 DIAGNOSIS — G20A1 Parkinson's disease without dyskinesia, without mention of fluctuations: Secondary | ICD-10-CM | POA: Diagnosis not present

## 2022-11-01 DIAGNOSIS — R1310 Dysphagia, unspecified: Secondary | ICD-10-CM | POA: Diagnosis not present

## 2022-11-01 DIAGNOSIS — R001 Bradycardia, unspecified: Secondary | ICD-10-CM | POA: Diagnosis not present

## 2022-11-01 DIAGNOSIS — F0280 Dementia in other diseases classified elsewhere without behavioral disturbance: Secondary | ICD-10-CM | POA: Diagnosis not present

## 2022-11-01 DIAGNOSIS — R4182 Altered mental status, unspecified: Secondary | ICD-10-CM | POA: Diagnosis not present

## 2022-11-01 DIAGNOSIS — Z9181 History of falling: Secondary | ICD-10-CM | POA: Diagnosis not present

## 2022-11-01 DIAGNOSIS — R41841 Cognitive communication deficit: Secondary | ICD-10-CM | POA: Diagnosis not present

## 2022-11-02 DIAGNOSIS — R262 Difficulty in walking, not elsewhere classified: Secondary | ICD-10-CM | POA: Diagnosis not present

## 2022-11-02 DIAGNOSIS — R1310 Dysphagia, unspecified: Secondary | ICD-10-CM | POA: Diagnosis not present

## 2022-11-02 DIAGNOSIS — M6281 Muscle weakness (generalized): Secondary | ICD-10-CM | POA: Diagnosis not present

## 2022-11-02 DIAGNOSIS — R296 Repeated falls: Secondary | ICD-10-CM | POA: Diagnosis not present

## 2022-11-02 DIAGNOSIS — R41841 Cognitive communication deficit: Secondary | ICD-10-CM | POA: Diagnosis not present

## 2022-11-02 DIAGNOSIS — Z9181 History of falling: Secondary | ICD-10-CM | POA: Diagnosis not present

## 2022-11-03 DIAGNOSIS — M6281 Muscle weakness (generalized): Secondary | ICD-10-CM | POA: Diagnosis not present

## 2022-11-03 DIAGNOSIS — Z9181 History of falling: Secondary | ICD-10-CM | POA: Diagnosis not present

## 2022-11-03 DIAGNOSIS — R296 Repeated falls: Secondary | ICD-10-CM | POA: Diagnosis not present

## 2022-11-03 DIAGNOSIS — R1310 Dysphagia, unspecified: Secondary | ICD-10-CM | POA: Diagnosis not present

## 2022-11-03 DIAGNOSIS — R41841 Cognitive communication deficit: Secondary | ICD-10-CM | POA: Diagnosis not present

## 2022-11-03 DIAGNOSIS — R262 Difficulty in walking, not elsewhere classified: Secondary | ICD-10-CM | POA: Diagnosis not present

## 2022-11-07 DIAGNOSIS — R278 Other lack of coordination: Secondary | ICD-10-CM | POA: Diagnosis not present

## 2022-11-07 DIAGNOSIS — G20C Parkinsonism, unspecified: Secondary | ICD-10-CM | POA: Diagnosis not present

## 2022-11-07 DIAGNOSIS — M6281 Muscle weakness (generalized): Secondary | ICD-10-CM | POA: Diagnosis not present

## 2022-11-07 DIAGNOSIS — R4182 Altered mental status, unspecified: Secondary | ICD-10-CM | POA: Diagnosis not present

## 2022-11-07 DIAGNOSIS — G20A1 Parkinson's disease without dyskinesia, without mention of fluctuations: Secondary | ICD-10-CM | POA: Diagnosis not present

## 2022-11-07 DIAGNOSIS — R262 Difficulty in walking, not elsewhere classified: Secondary | ICD-10-CM | POA: Diagnosis not present

## 2022-11-07 DIAGNOSIS — R293 Abnormal posture: Secondary | ICD-10-CM | POA: Diagnosis not present

## 2022-11-07 DIAGNOSIS — R296 Repeated falls: Secondary | ICD-10-CM | POA: Diagnosis not present

## 2022-11-07 DIAGNOSIS — Z9181 History of falling: Secondary | ICD-10-CM | POA: Diagnosis not present

## 2022-11-07 DIAGNOSIS — Z736 Limitation of activities due to disability: Secondary | ICD-10-CM | POA: Diagnosis not present

## 2022-11-07 DIAGNOSIS — Z741 Need for assistance with personal care: Secondary | ICD-10-CM | POA: Diagnosis not present

## 2022-11-08 DIAGNOSIS — Z9181 History of falling: Secondary | ICD-10-CM | POA: Diagnosis not present

## 2022-11-08 DIAGNOSIS — I10 Essential (primary) hypertension: Secondary | ICD-10-CM | POA: Diagnosis not present

## 2022-11-08 DIAGNOSIS — M6281 Muscle weakness (generalized): Secondary | ICD-10-CM | POA: Diagnosis not present

## 2022-11-08 DIAGNOSIS — G20A1 Parkinson's disease without dyskinesia, without mention of fluctuations: Secondary | ICD-10-CM | POA: Diagnosis not present

## 2022-11-08 DIAGNOSIS — Z736 Limitation of activities due to disability: Secondary | ICD-10-CM | POA: Diagnosis not present

## 2022-11-08 DIAGNOSIS — R296 Repeated falls: Secondary | ICD-10-CM | POA: Diagnosis not present

## 2022-11-08 DIAGNOSIS — R262 Difficulty in walking, not elsewhere classified: Secondary | ICD-10-CM | POA: Diagnosis not present

## 2022-11-08 DIAGNOSIS — E782 Mixed hyperlipidemia: Secondary | ICD-10-CM | POA: Diagnosis not present

## 2022-11-09 DIAGNOSIS — M6281 Muscle weakness (generalized): Secondary | ICD-10-CM | POA: Diagnosis not present

## 2022-11-09 DIAGNOSIS — R262 Difficulty in walking, not elsewhere classified: Secondary | ICD-10-CM | POA: Diagnosis not present

## 2022-11-09 DIAGNOSIS — R600 Localized edema: Secondary | ICD-10-CM | POA: Diagnosis not present

## 2022-11-09 DIAGNOSIS — Z9181 History of falling: Secondary | ICD-10-CM | POA: Diagnosis not present

## 2022-11-09 DIAGNOSIS — J300 Vasomotor rhinitis: Secondary | ICD-10-CM | POA: Diagnosis not present

## 2022-11-09 DIAGNOSIS — G20A1 Parkinson's disease without dyskinesia, without mention of fluctuations: Secondary | ICD-10-CM | POA: Diagnosis not present

## 2022-11-09 DIAGNOSIS — R296 Repeated falls: Secondary | ICD-10-CM | POA: Diagnosis not present

## 2022-11-09 DIAGNOSIS — Z736 Limitation of activities due to disability: Secondary | ICD-10-CM | POA: Diagnosis not present

## 2022-11-10 DIAGNOSIS — R262 Difficulty in walking, not elsewhere classified: Secondary | ICD-10-CM | POA: Diagnosis not present

## 2022-11-10 DIAGNOSIS — R296 Repeated falls: Secondary | ICD-10-CM | POA: Diagnosis not present

## 2022-11-10 DIAGNOSIS — G20A1 Parkinson's disease without dyskinesia, without mention of fluctuations: Secondary | ICD-10-CM | POA: Diagnosis not present

## 2022-11-10 DIAGNOSIS — M6281 Muscle weakness (generalized): Secondary | ICD-10-CM | POA: Diagnosis not present

## 2022-11-10 DIAGNOSIS — Z736 Limitation of activities due to disability: Secondary | ICD-10-CM | POA: Diagnosis not present

## 2022-11-10 DIAGNOSIS — Z9181 History of falling: Secondary | ICD-10-CM | POA: Diagnosis not present

## 2022-11-13 DIAGNOSIS — G20A1 Parkinson's disease without dyskinesia, without mention of fluctuations: Secondary | ICD-10-CM | POA: Diagnosis not present

## 2022-11-13 DIAGNOSIS — R296 Repeated falls: Secondary | ICD-10-CM | POA: Diagnosis not present

## 2022-11-13 DIAGNOSIS — M6281 Muscle weakness (generalized): Secondary | ICD-10-CM | POA: Diagnosis not present

## 2022-11-13 DIAGNOSIS — R262 Difficulty in walking, not elsewhere classified: Secondary | ICD-10-CM | POA: Diagnosis not present

## 2022-11-13 DIAGNOSIS — Z9181 History of falling: Secondary | ICD-10-CM | POA: Diagnosis not present

## 2022-11-13 DIAGNOSIS — Z736 Limitation of activities due to disability: Secondary | ICD-10-CM | POA: Diagnosis not present

## 2022-11-15 DIAGNOSIS — M6281 Muscle weakness (generalized): Secondary | ICD-10-CM | POA: Diagnosis not present

## 2022-11-15 DIAGNOSIS — R296 Repeated falls: Secondary | ICD-10-CM | POA: Diagnosis not present

## 2022-11-15 DIAGNOSIS — G20A1 Parkinson's disease without dyskinesia, without mention of fluctuations: Secondary | ICD-10-CM | POA: Diagnosis not present

## 2022-11-15 DIAGNOSIS — Z9181 History of falling: Secondary | ICD-10-CM | POA: Diagnosis not present

## 2022-11-15 DIAGNOSIS — R262 Difficulty in walking, not elsewhere classified: Secondary | ICD-10-CM | POA: Diagnosis not present

## 2022-11-15 DIAGNOSIS — Z736 Limitation of activities due to disability: Secondary | ICD-10-CM | POA: Diagnosis not present

## 2022-11-16 DIAGNOSIS — M6281 Muscle weakness (generalized): Secondary | ICD-10-CM | POA: Diagnosis not present

## 2022-11-16 DIAGNOSIS — R296 Repeated falls: Secondary | ICD-10-CM | POA: Diagnosis not present

## 2022-11-16 DIAGNOSIS — Z712 Person consulting for explanation of examination or test findings: Secondary | ICD-10-CM | POA: Diagnosis not present

## 2022-11-16 DIAGNOSIS — R600 Localized edema: Secondary | ICD-10-CM | POA: Diagnosis not present

## 2022-11-16 DIAGNOSIS — Z9181 History of falling: Secondary | ICD-10-CM | POA: Diagnosis not present

## 2022-11-16 DIAGNOSIS — R262 Difficulty in walking, not elsewhere classified: Secondary | ICD-10-CM | POA: Diagnosis not present

## 2022-11-16 DIAGNOSIS — G20A1 Parkinson's disease without dyskinesia, without mention of fluctuations: Secondary | ICD-10-CM | POA: Diagnosis not present

## 2022-11-16 DIAGNOSIS — Z736 Limitation of activities due to disability: Secondary | ICD-10-CM | POA: Diagnosis not present

## 2022-11-20 DIAGNOSIS — G20A1 Parkinson's disease without dyskinesia, without mention of fluctuations: Secondary | ICD-10-CM | POA: Diagnosis not present

## 2022-11-20 DIAGNOSIS — Z9181 History of falling: Secondary | ICD-10-CM | POA: Diagnosis not present

## 2022-11-20 DIAGNOSIS — R262 Difficulty in walking, not elsewhere classified: Secondary | ICD-10-CM | POA: Diagnosis not present

## 2022-11-20 DIAGNOSIS — M6281 Muscle weakness (generalized): Secondary | ICD-10-CM | POA: Diagnosis not present

## 2022-11-20 DIAGNOSIS — R296 Repeated falls: Secondary | ICD-10-CM | POA: Diagnosis not present

## 2022-11-20 DIAGNOSIS — Z736 Limitation of activities due to disability: Secondary | ICD-10-CM | POA: Diagnosis not present

## 2022-11-23 DIAGNOSIS — G20A1 Parkinson's disease without dyskinesia, without mention of fluctuations: Secondary | ICD-10-CM | POA: Diagnosis not present

## 2022-11-23 DIAGNOSIS — R296 Repeated falls: Secondary | ICD-10-CM | POA: Diagnosis not present

## 2022-11-23 DIAGNOSIS — M6281 Muscle weakness (generalized): Secondary | ICD-10-CM | POA: Diagnosis not present

## 2022-11-23 DIAGNOSIS — I951 Orthostatic hypotension: Secondary | ICD-10-CM | POA: Diagnosis not present

## 2022-11-23 DIAGNOSIS — R262 Difficulty in walking, not elsewhere classified: Secondary | ICD-10-CM | POA: Diagnosis not present

## 2022-11-23 DIAGNOSIS — I10 Essential (primary) hypertension: Secondary | ICD-10-CM | POA: Diagnosis not present

## 2022-11-23 DIAGNOSIS — Z9181 History of falling: Secondary | ICD-10-CM | POA: Diagnosis not present

## 2022-11-23 DIAGNOSIS — G20B1 Parkinson's disease with dyskinesia, without mention of fluctuations: Secondary | ICD-10-CM | POA: Diagnosis not present

## 2022-11-23 DIAGNOSIS — G9009 Other idiopathic peripheral autonomic neuropathy: Secondary | ICD-10-CM | POA: Diagnosis not present

## 2022-11-23 DIAGNOSIS — Z736 Limitation of activities due to disability: Secondary | ICD-10-CM | POA: Diagnosis not present

## 2022-11-24 DIAGNOSIS — R296 Repeated falls: Secondary | ICD-10-CM | POA: Diagnosis not present

## 2022-11-24 DIAGNOSIS — Z736 Limitation of activities due to disability: Secondary | ICD-10-CM | POA: Diagnosis not present

## 2022-11-24 DIAGNOSIS — G20A1 Parkinson's disease without dyskinesia, without mention of fluctuations: Secondary | ICD-10-CM | POA: Diagnosis not present

## 2022-11-24 DIAGNOSIS — Z9181 History of falling: Secondary | ICD-10-CM | POA: Diagnosis not present

## 2022-11-24 DIAGNOSIS — M6281 Muscle weakness (generalized): Secondary | ICD-10-CM | POA: Diagnosis not present

## 2022-11-24 DIAGNOSIS — R262 Difficulty in walking, not elsewhere classified: Secondary | ICD-10-CM | POA: Diagnosis not present

## 2022-11-28 DIAGNOSIS — Z9181 History of falling: Secondary | ICD-10-CM | POA: Diagnosis not present

## 2022-11-28 DIAGNOSIS — R262 Difficulty in walking, not elsewhere classified: Secondary | ICD-10-CM | POA: Diagnosis not present

## 2022-11-28 DIAGNOSIS — R296 Repeated falls: Secondary | ICD-10-CM | POA: Diagnosis not present

## 2022-11-28 DIAGNOSIS — Z736 Limitation of activities due to disability: Secondary | ICD-10-CM | POA: Diagnosis not present

## 2022-11-28 DIAGNOSIS — M6281 Muscle weakness (generalized): Secondary | ICD-10-CM | POA: Diagnosis not present

## 2022-11-28 DIAGNOSIS — G20A1 Parkinson's disease without dyskinesia, without mention of fluctuations: Secondary | ICD-10-CM | POA: Diagnosis not present

## 2022-11-29 DIAGNOSIS — Z03818 Encounter for observation for suspected exposure to other biological agents ruled out: Secondary | ICD-10-CM | POA: Diagnosis not present

## 2022-11-29 DIAGNOSIS — G9389 Other specified disorders of brain: Secondary | ICD-10-CM | POA: Diagnosis not present

## 2022-11-29 DIAGNOSIS — G459 Transient cerebral ischemic attack, unspecified: Secondary | ICD-10-CM | POA: Diagnosis not present

## 2022-11-29 DIAGNOSIS — R531 Weakness: Secondary | ICD-10-CM | POA: Diagnosis not present

## 2022-11-29 DIAGNOSIS — L0103 Bullous impetigo: Secondary | ICD-10-CM | POA: Diagnosis not present

## 2022-11-29 DIAGNOSIS — K117 Disturbances of salivary secretion: Secondary | ICD-10-CM | POA: Diagnosis not present

## 2022-11-30 DIAGNOSIS — R451 Restlessness and agitation: Secondary | ICD-10-CM | POA: Diagnosis not present

## 2022-11-30 DIAGNOSIS — M6281 Muscle weakness (generalized): Secondary | ICD-10-CM | POA: Diagnosis not present

## 2022-11-30 DIAGNOSIS — L0103 Bullous impetigo: Secondary | ICD-10-CM | POA: Diagnosis not present

## 2022-11-30 DIAGNOSIS — R600 Localized edema: Secondary | ICD-10-CM | POA: Diagnosis not present

## 2022-11-30 DIAGNOSIS — H10023 Other mucopurulent conjunctivitis, bilateral: Secondary | ICD-10-CM | POA: Diagnosis not present

## 2022-11-30 DIAGNOSIS — F03B2 Unspecified dementia, moderate, with psychotic disturbance: Secondary | ICD-10-CM | POA: Diagnosis not present

## 2022-11-30 DIAGNOSIS — I10 Essential (primary) hypertension: Secondary | ICD-10-CM | POA: Diagnosis not present

## 2022-12-08 DIAGNOSIS — L120 Bullous pemphigoid: Secondary | ICD-10-CM | POA: Diagnosis not present

## 2022-12-12 DIAGNOSIS — R278 Other lack of coordination: Secondary | ICD-10-CM | POA: Diagnosis not present

## 2022-12-12 DIAGNOSIS — G20C Parkinsonism, unspecified: Secondary | ICD-10-CM | POA: Diagnosis not present

## 2022-12-12 DIAGNOSIS — R293 Abnormal posture: Secondary | ICD-10-CM | POA: Diagnosis not present

## 2022-12-12 DIAGNOSIS — M6281 Muscle weakness (generalized): Secondary | ICD-10-CM | POA: Diagnosis not present

## 2022-12-12 DIAGNOSIS — R262 Difficulty in walking, not elsewhere classified: Secondary | ICD-10-CM | POA: Diagnosis not present

## 2022-12-12 DIAGNOSIS — E782 Mixed hyperlipidemia: Secondary | ICD-10-CM | POA: Diagnosis not present

## 2022-12-12 DIAGNOSIS — G20A1 Parkinson's disease without dyskinesia, without mention of fluctuations: Secondary | ICD-10-CM | POA: Diagnosis not present

## 2022-12-12 DIAGNOSIS — Z9181 History of falling: Secondary | ICD-10-CM | POA: Diagnosis not present

## 2022-12-12 DIAGNOSIS — R4182 Altered mental status, unspecified: Secondary | ICD-10-CM | POA: Diagnosis not present

## 2022-12-12 DIAGNOSIS — I10 Essential (primary) hypertension: Secondary | ICD-10-CM | POA: Diagnosis not present

## 2022-12-13 DIAGNOSIS — M6281 Muscle weakness (generalized): Secondary | ICD-10-CM | POA: Diagnosis not present

## 2022-12-13 DIAGNOSIS — Z9181 History of falling: Secondary | ICD-10-CM | POA: Diagnosis not present

## 2022-12-13 DIAGNOSIS — R262 Difficulty in walking, not elsewhere classified: Secondary | ICD-10-CM | POA: Diagnosis not present

## 2022-12-13 DIAGNOSIS — G20A1 Parkinson's disease without dyskinesia, without mention of fluctuations: Secondary | ICD-10-CM | POA: Diagnosis not present

## 2022-12-13 DIAGNOSIS — G20C Parkinsonism, unspecified: Secondary | ICD-10-CM | POA: Diagnosis not present

## 2022-12-13 DIAGNOSIS — R278 Other lack of coordination: Secondary | ICD-10-CM | POA: Diagnosis not present

## 2022-12-14 DIAGNOSIS — G20C Parkinsonism, unspecified: Secondary | ICD-10-CM | POA: Diagnosis not present

## 2022-12-14 DIAGNOSIS — I10 Essential (primary) hypertension: Secondary | ICD-10-CM | POA: Diagnosis not present

## 2022-12-14 DIAGNOSIS — M6281 Muscle weakness (generalized): Secondary | ICD-10-CM | POA: Diagnosis not present

## 2022-12-14 DIAGNOSIS — L120 Bullous pemphigoid: Secondary | ICD-10-CM | POA: Diagnosis not present

## 2022-12-14 DIAGNOSIS — R278 Other lack of coordination: Secondary | ICD-10-CM | POA: Diagnosis not present

## 2022-12-14 DIAGNOSIS — Z9181 History of falling: Secondary | ICD-10-CM | POA: Diagnosis not present

## 2022-12-14 DIAGNOSIS — R262 Difficulty in walking, not elsewhere classified: Secondary | ICD-10-CM | POA: Diagnosis not present

## 2022-12-14 DIAGNOSIS — R600 Localized edema: Secondary | ICD-10-CM | POA: Diagnosis not present

## 2022-12-14 DIAGNOSIS — M25569 Pain in unspecified knee: Secondary | ICD-10-CM | POA: Diagnosis not present

## 2022-12-14 DIAGNOSIS — G20A1 Parkinson's disease without dyskinesia, without mention of fluctuations: Secondary | ICD-10-CM | POA: Diagnosis not present

## 2022-12-20 DIAGNOSIS — G20C Parkinsonism, unspecified: Secondary | ICD-10-CM | POA: Diagnosis not present

## 2022-12-20 DIAGNOSIS — Z9181 History of falling: Secondary | ICD-10-CM | POA: Diagnosis not present

## 2022-12-20 DIAGNOSIS — R262 Difficulty in walking, not elsewhere classified: Secondary | ICD-10-CM | POA: Diagnosis not present

## 2022-12-20 DIAGNOSIS — R278 Other lack of coordination: Secondary | ICD-10-CM | POA: Diagnosis not present

## 2022-12-20 DIAGNOSIS — M6281 Muscle weakness (generalized): Secondary | ICD-10-CM | POA: Diagnosis not present

## 2022-12-20 DIAGNOSIS — G20A1 Parkinson's disease without dyskinesia, without mention of fluctuations: Secondary | ICD-10-CM | POA: Diagnosis not present

## 2022-12-21 DIAGNOSIS — M6281 Muscle weakness (generalized): Secondary | ICD-10-CM | POA: Diagnosis not present

## 2022-12-21 DIAGNOSIS — G9009 Other idiopathic peripheral autonomic neuropathy: Secondary | ICD-10-CM | POA: Diagnosis not present

## 2022-12-21 DIAGNOSIS — M25569 Pain in unspecified knee: Secondary | ICD-10-CM | POA: Diagnosis not present

## 2022-12-21 DIAGNOSIS — I10 Essential (primary) hypertension: Secondary | ICD-10-CM | POA: Diagnosis not present

## 2022-12-21 DIAGNOSIS — R262 Difficulty in walking, not elsewhere classified: Secondary | ICD-10-CM | POA: Diagnosis not present

## 2022-12-21 DIAGNOSIS — R278 Other lack of coordination: Secondary | ICD-10-CM | POA: Diagnosis not present

## 2022-12-21 DIAGNOSIS — L120 Bullous pemphigoid: Secondary | ICD-10-CM | POA: Diagnosis not present

## 2022-12-21 DIAGNOSIS — G20B1 Parkinson's disease with dyskinesia, without mention of fluctuations: Secondary | ICD-10-CM | POA: Diagnosis not present

## 2022-12-21 DIAGNOSIS — G20C Parkinsonism, unspecified: Secondary | ICD-10-CM | POA: Diagnosis not present

## 2022-12-21 DIAGNOSIS — G20A1 Parkinson's disease without dyskinesia, without mention of fluctuations: Secondary | ICD-10-CM | POA: Diagnosis not present

## 2022-12-21 DIAGNOSIS — Z9181 History of falling: Secondary | ICD-10-CM | POA: Diagnosis not present

## 2022-12-22 DIAGNOSIS — F02B4 Dementia in other diseases classified elsewhere, moderate, with anxiety: Secondary | ICD-10-CM | POA: Diagnosis not present

## 2022-12-22 DIAGNOSIS — Z8673 Personal history of transient ischemic attack (TIA), and cerebral infarction without residual deficits: Secondary | ICD-10-CM | POA: Diagnosis not present

## 2022-12-22 DIAGNOSIS — G20B2 Parkinson's disease with dyskinesia, with fluctuations: Secondary | ICD-10-CM | POA: Diagnosis not present

## 2022-12-22 DIAGNOSIS — R2681 Unsteadiness on feet: Secondary | ICD-10-CM | POA: Diagnosis not present

## 2022-12-22 DIAGNOSIS — R413 Other amnesia: Secondary | ICD-10-CM | POA: Diagnosis not present

## 2022-12-22 DIAGNOSIS — F01B4 Vascular dementia, moderate, with anxiety: Secondary | ICD-10-CM | POA: Diagnosis not present

## 2022-12-22 DIAGNOSIS — Z87898 Personal history of other specified conditions: Secondary | ICD-10-CM | POA: Diagnosis not present

## 2022-12-22 DIAGNOSIS — R296 Repeated falls: Secondary | ICD-10-CM | POA: Diagnosis not present

## 2022-12-22 DIAGNOSIS — G20A1 Parkinson's disease without dyskinesia, without mention of fluctuations: Secondary | ICD-10-CM | POA: Diagnosis not present

## 2022-12-22 DIAGNOSIS — R531 Weakness: Secondary | ICD-10-CM | POA: Diagnosis not present

## 2022-12-24 DIAGNOSIS — F03B2 Unspecified dementia, moderate, with psychotic disturbance: Secondary | ICD-10-CM | POA: Diagnosis not present

## 2022-12-26 DIAGNOSIS — G20A1 Parkinson's disease without dyskinesia, without mention of fluctuations: Secondary | ICD-10-CM | POA: Diagnosis not present

## 2022-12-26 DIAGNOSIS — R278 Other lack of coordination: Secondary | ICD-10-CM | POA: Diagnosis not present

## 2022-12-26 DIAGNOSIS — G20C Parkinsonism, unspecified: Secondary | ICD-10-CM | POA: Diagnosis not present

## 2022-12-26 DIAGNOSIS — R262 Difficulty in walking, not elsewhere classified: Secondary | ICD-10-CM | POA: Diagnosis not present

## 2022-12-26 DIAGNOSIS — Z9181 History of falling: Secondary | ICD-10-CM | POA: Diagnosis not present

## 2022-12-26 DIAGNOSIS — M6281 Muscle weakness (generalized): Secondary | ICD-10-CM | POA: Diagnosis not present

## 2022-12-27 DIAGNOSIS — G20A1 Parkinson's disease without dyskinesia, without mention of fluctuations: Secondary | ICD-10-CM | POA: Diagnosis not present

## 2022-12-27 DIAGNOSIS — R262 Difficulty in walking, not elsewhere classified: Secondary | ICD-10-CM | POA: Diagnosis not present

## 2022-12-27 DIAGNOSIS — Z9181 History of falling: Secondary | ICD-10-CM | POA: Diagnosis not present

## 2022-12-27 DIAGNOSIS — R278 Other lack of coordination: Secondary | ICD-10-CM | POA: Diagnosis not present

## 2022-12-27 DIAGNOSIS — M6281 Muscle weakness (generalized): Secondary | ICD-10-CM | POA: Diagnosis not present

## 2022-12-27 DIAGNOSIS — G20C Parkinsonism, unspecified: Secondary | ICD-10-CM | POA: Diagnosis not present

## 2022-12-28 DIAGNOSIS — R278 Other lack of coordination: Secondary | ICD-10-CM | POA: Diagnosis not present

## 2022-12-28 DIAGNOSIS — L120 Bullous pemphigoid: Secondary | ICD-10-CM | POA: Diagnosis not present

## 2022-12-28 DIAGNOSIS — G20C Parkinsonism, unspecified: Secondary | ICD-10-CM | POA: Diagnosis not present

## 2022-12-28 DIAGNOSIS — R262 Difficulty in walking, not elsewhere classified: Secondary | ICD-10-CM | POA: Diagnosis not present

## 2022-12-28 DIAGNOSIS — Z9181 History of falling: Secondary | ICD-10-CM | POA: Diagnosis not present

## 2022-12-28 DIAGNOSIS — R600 Localized edema: Secondary | ICD-10-CM | POA: Diagnosis not present

## 2022-12-28 DIAGNOSIS — G20A1 Parkinson's disease without dyskinesia, without mention of fluctuations: Secondary | ICD-10-CM | POA: Diagnosis not present

## 2022-12-28 DIAGNOSIS — M6281 Muscle weakness (generalized): Secondary | ICD-10-CM | POA: Diagnosis not present

## 2022-12-29 DIAGNOSIS — M6281 Muscle weakness (generalized): Secondary | ICD-10-CM | POA: Diagnosis not present

## 2022-12-29 DIAGNOSIS — R262 Difficulty in walking, not elsewhere classified: Secondary | ICD-10-CM | POA: Diagnosis not present

## 2022-12-29 DIAGNOSIS — R278 Other lack of coordination: Secondary | ICD-10-CM | POA: Diagnosis not present

## 2022-12-29 DIAGNOSIS — Z9181 History of falling: Secondary | ICD-10-CM | POA: Diagnosis not present

## 2022-12-29 DIAGNOSIS — G20A1 Parkinson's disease without dyskinesia, without mention of fluctuations: Secondary | ICD-10-CM | POA: Diagnosis not present

## 2022-12-29 DIAGNOSIS — G20C Parkinsonism, unspecified: Secondary | ICD-10-CM | POA: Diagnosis not present

## 2023-01-02 DIAGNOSIS — R262 Difficulty in walking, not elsewhere classified: Secondary | ICD-10-CM | POA: Diagnosis not present

## 2023-01-02 DIAGNOSIS — Z9181 History of falling: Secondary | ICD-10-CM | POA: Diagnosis not present

## 2023-01-02 DIAGNOSIS — G20A1 Parkinson's disease without dyskinesia, without mention of fluctuations: Secondary | ICD-10-CM | POA: Diagnosis not present

## 2023-01-02 DIAGNOSIS — G20C Parkinsonism, unspecified: Secondary | ICD-10-CM | POA: Diagnosis not present

## 2023-01-02 DIAGNOSIS — R278 Other lack of coordination: Secondary | ICD-10-CM | POA: Diagnosis not present

## 2023-01-02 DIAGNOSIS — M6281 Muscle weakness (generalized): Secondary | ICD-10-CM | POA: Diagnosis not present

## 2023-01-03 DIAGNOSIS — G20C Parkinsonism, unspecified: Secondary | ICD-10-CM | POA: Diagnosis not present

## 2023-01-03 DIAGNOSIS — R262 Difficulty in walking, not elsewhere classified: Secondary | ICD-10-CM | POA: Diagnosis not present

## 2023-01-03 DIAGNOSIS — R278 Other lack of coordination: Secondary | ICD-10-CM | POA: Diagnosis not present

## 2023-01-03 DIAGNOSIS — Z9181 History of falling: Secondary | ICD-10-CM | POA: Diagnosis not present

## 2023-01-03 DIAGNOSIS — M6281 Muscle weakness (generalized): Secondary | ICD-10-CM | POA: Diagnosis not present

## 2023-01-03 DIAGNOSIS — G20A1 Parkinson's disease without dyskinesia, without mention of fluctuations: Secondary | ICD-10-CM | POA: Diagnosis not present

## 2023-01-23 ENCOUNTER — Other Ambulatory Visit: Payer: Medicare Other

## 2023-01-30 ENCOUNTER — Encounter: Payer: Medicare Other | Admitting: Family Medicine

## 2023-04-06 DEATH — deceased
